# Patient Record
Sex: Female | Born: 1945
Health system: Southern US, Community
[De-identification: ages and names within clinical notes are randomized; demographics above are authoritative.]

## PROBLEM LIST (undated history)

## (undated) DIAGNOSIS — R569 Unspecified convulsions: Secondary | ICD-10-CM

## (undated) DIAGNOSIS — R51 Headache: Secondary | ICD-10-CM

## (undated) DIAGNOSIS — F329 Major depressive disorder, single episode, unspecified: Secondary | ICD-10-CM

## (undated) DIAGNOSIS — F419 Anxiety disorder, unspecified: Secondary | ICD-10-CM

## (undated) DIAGNOSIS — Z7409 Other reduced mobility: Secondary | ICD-10-CM

## (undated) DIAGNOSIS — F32A Depression, unspecified: Secondary | ICD-10-CM

## (undated) DIAGNOSIS — J45909 Unspecified asthma, uncomplicated: Secondary | ICD-10-CM

## (undated) DIAGNOSIS — N281 Cyst of kidney, acquired: Secondary | ICD-10-CM

## (undated) DIAGNOSIS — T819XXA Unspecified complication of procedure, initial encounter: Secondary | ICD-10-CM

## (undated) DIAGNOSIS — R519 Headache, unspecified: Secondary | ICD-10-CM

## (undated) DIAGNOSIS — Z8719 Personal history of other diseases of the digestive system: Secondary | ICD-10-CM

## (undated) DIAGNOSIS — M1990 Unspecified osteoarthritis, unspecified site: Secondary | ICD-10-CM

## (undated) DIAGNOSIS — K5900 Constipation, unspecified: Secondary | ICD-10-CM

## (undated) DIAGNOSIS — IMO0001 Reserved for inherently not codable concepts without codable children: Secondary | ICD-10-CM

## (undated) DIAGNOSIS — I10 Essential (primary) hypertension: Secondary | ICD-10-CM

## (undated) HISTORY — DX: Unspecified complication of procedure, initial encounter: T81.9XXA

## (undated) HISTORY — PX: BACK SURGERY: SHX140

## (undated) HISTORY — DX: Anxiety disorder, unspecified: F41.9

## (undated) HISTORY — DX: Depression, unspecified: F32.A

## (undated) HISTORY — DX: Constipation, unspecified: K59.00

## (undated) HISTORY — DX: Other reduced mobility: Z74.09

## (undated) HISTORY — PX: OTHER SURGICAL HISTORY: SHX169

## (undated) HISTORY — PX: TONSILLECTOMY: SUR1361

## (undated) HISTORY — DX: Major depressive disorder, single episode, unspecified: F32.9

## (undated) HISTORY — PX: ABDOMINAL HYSTERECTOMY: SHX81

---

## 2000-09-27 ENCOUNTER — Encounter: Admission: RE | Admit: 2000-09-27 | Discharge: 2000-09-27 | Payer: Self-pay | Admitting: Family Medicine

## 2000-09-27 ENCOUNTER — Encounter: Payer: Self-pay | Admitting: Family Medicine

## 2001-01-04 ENCOUNTER — Other Ambulatory Visit: Admission: RE | Admit: 2001-01-04 | Discharge: 2001-01-04 | Payer: Self-pay | Admitting: Family Medicine

## 2001-01-16 ENCOUNTER — Encounter: Admission: RE | Admit: 2001-01-16 | Discharge: 2001-01-16 | Payer: Self-pay | Admitting: *Deleted

## 2001-01-16 ENCOUNTER — Encounter: Payer: Self-pay | Admitting: *Deleted

## 2001-09-01 ENCOUNTER — Encounter: Payer: Self-pay | Admitting: *Deleted

## 2001-09-01 ENCOUNTER — Emergency Department (HOSPITAL_COMMUNITY): Admission: EM | Admit: 2001-09-01 | Discharge: 2001-09-01 | Payer: Self-pay | Admitting: Emergency Medicine

## 2001-11-08 ENCOUNTER — Emergency Department (HOSPITAL_COMMUNITY): Admission: EM | Admit: 2001-11-08 | Discharge: 2001-11-08 | Payer: Self-pay | Admitting: Emergency Medicine

## 2001-11-08 ENCOUNTER — Encounter: Payer: Self-pay | Admitting: Emergency Medicine

## 2002-01-01 ENCOUNTER — Emergency Department (HOSPITAL_COMMUNITY): Admission: EM | Admit: 2002-01-01 | Discharge: 2002-01-01 | Payer: Self-pay | Admitting: *Deleted

## 2002-01-01 ENCOUNTER — Encounter: Payer: Self-pay | Admitting: *Deleted

## 2002-02-26 ENCOUNTER — Emergency Department (HOSPITAL_COMMUNITY): Admission: EM | Admit: 2002-02-26 | Discharge: 2002-02-26 | Payer: Self-pay | Admitting: *Deleted

## 2002-02-26 ENCOUNTER — Encounter: Payer: Self-pay | Admitting: Emergency Medicine

## 2002-03-03 ENCOUNTER — Encounter: Payer: Self-pay | Admitting: Specialist

## 2002-03-03 ENCOUNTER — Encounter: Admission: RE | Admit: 2002-03-03 | Discharge: 2002-03-03 | Payer: Self-pay | Admitting: Specialist

## 2002-03-17 ENCOUNTER — Encounter: Payer: Self-pay | Admitting: Specialist

## 2002-03-17 ENCOUNTER — Encounter: Admission: RE | Admit: 2002-03-17 | Discharge: 2002-03-17 | Payer: Self-pay | Admitting: Specialist

## 2002-03-31 ENCOUNTER — Encounter: Payer: Self-pay | Admitting: Specialist

## 2002-03-31 ENCOUNTER — Encounter: Admission: RE | Admit: 2002-03-31 | Discharge: 2002-03-31 | Payer: Self-pay | Admitting: Specialist

## 2002-04-11 ENCOUNTER — Ambulatory Visit (HOSPITAL_COMMUNITY): Admission: RE | Admit: 2002-04-11 | Discharge: 2002-04-11 | Payer: Self-pay | Admitting: Specialist

## 2002-04-11 ENCOUNTER — Encounter (INDEPENDENT_AMBULATORY_CARE_PROVIDER_SITE_OTHER): Payer: Self-pay | Admitting: Specialist

## 2002-07-11 ENCOUNTER — Ambulatory Visit (HOSPITAL_COMMUNITY): Admission: RE | Admit: 2002-07-11 | Discharge: 2002-07-11 | Payer: Self-pay | Admitting: Urology

## 2002-07-11 ENCOUNTER — Encounter: Payer: Self-pay | Admitting: Urology

## 2002-07-17 ENCOUNTER — Ambulatory Visit (HOSPITAL_COMMUNITY): Admission: RE | Admit: 2002-07-17 | Discharge: 2002-07-17 | Payer: Self-pay | Admitting: Urology

## 2002-07-17 ENCOUNTER — Encounter: Payer: Self-pay | Admitting: Urology

## 2002-07-18 ENCOUNTER — Ambulatory Visit (HOSPITAL_COMMUNITY): Admission: RE | Admit: 2002-07-18 | Discharge: 2002-07-18 | Payer: Self-pay | Admitting: Internal Medicine

## 2002-07-18 ENCOUNTER — Encounter (INDEPENDENT_AMBULATORY_CARE_PROVIDER_SITE_OTHER): Payer: Self-pay | Admitting: Internal Medicine

## 2002-07-25 ENCOUNTER — Encounter: Payer: Self-pay | Admitting: Internal Medicine

## 2002-07-25 ENCOUNTER — Ambulatory Visit (HOSPITAL_COMMUNITY): Admission: RE | Admit: 2002-07-25 | Discharge: 2002-07-25 | Payer: Self-pay | Admitting: Internal Medicine

## 2002-08-18 ENCOUNTER — Ambulatory Visit (HOSPITAL_COMMUNITY): Admission: RE | Admit: 2002-08-18 | Discharge: 2002-08-18 | Payer: Self-pay | Admitting: Internal Medicine

## 2002-08-20 ENCOUNTER — Ambulatory Visit (HOSPITAL_COMMUNITY): Admission: RE | Admit: 2002-08-20 | Discharge: 2002-08-20 | Payer: Self-pay | Admitting: *Deleted

## 2002-08-20 ENCOUNTER — Encounter (INDEPENDENT_AMBULATORY_CARE_PROVIDER_SITE_OTHER): Payer: Self-pay | Admitting: Internal Medicine

## 2002-10-27 ENCOUNTER — Encounter: Payer: Self-pay | Admitting: Specialist

## 2002-10-27 ENCOUNTER — Encounter: Admission: RE | Admit: 2002-10-27 | Discharge: 2002-10-27 | Payer: Self-pay | Admitting: Specialist

## 2002-10-28 ENCOUNTER — Encounter: Payer: Self-pay | Admitting: Specialist

## 2003-01-27 ENCOUNTER — Encounter: Payer: Self-pay | Admitting: Specialist

## 2003-01-27 ENCOUNTER — Inpatient Hospital Stay (HOSPITAL_COMMUNITY): Admission: RE | Admit: 2003-01-27 | Discharge: 2003-01-30 | Payer: Self-pay | Admitting: Specialist

## 2003-05-05 ENCOUNTER — Inpatient Hospital Stay (HOSPITAL_COMMUNITY): Admission: AD | Admit: 2003-05-05 | Discharge: 2003-05-07 | Payer: Self-pay | Admitting: Internal Medicine

## 2003-05-10 ENCOUNTER — Inpatient Hospital Stay (HOSPITAL_COMMUNITY): Admission: AD | Admit: 2003-05-10 | Discharge: 2003-05-14 | Payer: Self-pay | Admitting: Family Medicine

## 2003-05-10 ENCOUNTER — Encounter: Payer: Self-pay | Admitting: Family Medicine

## 2003-05-12 ENCOUNTER — Encounter (INDEPENDENT_AMBULATORY_CARE_PROVIDER_SITE_OTHER): Payer: Self-pay | Admitting: Internal Medicine

## 2003-05-13 ENCOUNTER — Encounter (INDEPENDENT_AMBULATORY_CARE_PROVIDER_SITE_OTHER): Payer: Self-pay | Admitting: Internal Medicine

## 2003-07-10 ENCOUNTER — Ambulatory Visit (HOSPITAL_COMMUNITY): Admission: RE | Admit: 2003-07-10 | Discharge: 2003-07-10 | Payer: Self-pay | Admitting: Internal Medicine

## 2003-07-28 ENCOUNTER — Ambulatory Visit (HOSPITAL_COMMUNITY): Admission: RE | Admit: 2003-07-28 | Discharge: 2003-07-28 | Payer: Self-pay | Admitting: Internal Medicine

## 2003-10-11 ENCOUNTER — Emergency Department (HOSPITAL_COMMUNITY): Admission: EM | Admit: 2003-10-11 | Discharge: 2003-10-11 | Payer: Self-pay | Admitting: Emergency Medicine

## 2003-11-09 ENCOUNTER — Encounter: Admission: RE | Admit: 2003-11-09 | Discharge: 2003-11-09 | Payer: Self-pay | Admitting: Orthopedic Surgery

## 2004-02-16 ENCOUNTER — Ambulatory Visit (HOSPITAL_BASED_OUTPATIENT_CLINIC_OR_DEPARTMENT_OTHER): Admission: RE | Admit: 2004-02-16 | Discharge: 2004-02-16 | Payer: Self-pay | Admitting: Orthopedic Surgery

## 2004-05-31 ENCOUNTER — Encounter (HOSPITAL_COMMUNITY): Admission: RE | Admit: 2004-05-31 | Discharge: 2004-06-03 | Payer: Self-pay | Admitting: Orthopedic Surgery

## 2004-06-07 ENCOUNTER — Encounter (HOSPITAL_COMMUNITY): Admission: RE | Admit: 2004-06-07 | Discharge: 2004-07-07 | Payer: Self-pay | Admitting: Orthopedic Surgery

## 2004-06-13 ENCOUNTER — Emergency Department (HOSPITAL_COMMUNITY): Admission: EM | Admit: 2004-06-13 | Discharge: 2004-06-13 | Payer: Self-pay | Admitting: Emergency Medicine

## 2004-06-27 ENCOUNTER — Ambulatory Visit (HOSPITAL_COMMUNITY): Admission: RE | Admit: 2004-06-27 | Discharge: 2004-06-27 | Payer: Self-pay | Admitting: Neurology

## 2004-07-06 ENCOUNTER — Emergency Department (HOSPITAL_COMMUNITY): Admission: EM | Admit: 2004-07-06 | Discharge: 2004-07-06 | Payer: Self-pay | Admitting: Emergency Medicine

## 2005-07-26 ENCOUNTER — Ambulatory Visit: Payer: Self-pay | Admitting: Physical Medicine & Rehabilitation

## 2005-07-26 ENCOUNTER — Inpatient Hospital Stay (HOSPITAL_COMMUNITY): Admission: RE | Admit: 2005-07-26 | Discharge: 2005-07-31 | Payer: Self-pay | Admitting: Orthopedic Surgery

## 2009-03-22 ENCOUNTER — Ambulatory Visit (HOSPITAL_COMMUNITY): Admission: RE | Admit: 2009-03-22 | Discharge: 2009-03-22 | Payer: Self-pay | Admitting: Urology

## 2010-07-19 ENCOUNTER — Emergency Department (HOSPITAL_COMMUNITY): Admission: EM | Admit: 2010-07-19 | Discharge: 2010-07-19 | Payer: Self-pay | Admitting: Emergency Medicine

## 2010-11-28 ENCOUNTER — Other Ambulatory Visit (HOSPITAL_COMMUNITY): Payer: Self-pay | Admitting: Internal Medicine

## 2010-11-28 DIAGNOSIS — R1011 Right upper quadrant pain: Secondary | ICD-10-CM

## 2010-11-29 ENCOUNTER — Ambulatory Visit (HOSPITAL_COMMUNITY)
Admission: RE | Admit: 2010-11-29 | Discharge: 2010-11-29 | Disposition: A | Discharge status: Home / Self Care | Payer: PRIVATE HEALTH INSURANCE | Source: Ambulatory Visit | Attending: Internal Medicine | Admitting: Internal Medicine

## 2010-11-29 DIAGNOSIS — R1011 Right upper quadrant pain: Secondary | ICD-10-CM | POA: Insufficient documentation

## 2010-11-29 DIAGNOSIS — R112 Nausea with vomiting, unspecified: Secondary | ICD-10-CM | POA: Insufficient documentation

## 2010-11-29 DIAGNOSIS — K7689 Other specified diseases of liver: Secondary | ICD-10-CM | POA: Insufficient documentation

## 2011-01-20 NOTE — H&P (Signed)
NAME:  Renee Bridges, Renee Bridges                          ACCOUNT NO.:  [id number]   MEDICAL RECORD NO.:  [id number]                   PATIENT TYPE:  INP   LOCATION:  A307                                 FACILITY:  [facility]   PHYSICIAN:  Kingsley Callander. Ouida Sills, M.D.                  DATE OF BIRTH:  06/28/1946   DATE OF ADMISSION:  05/05/2003  DATE OF DISCHARGE:                                HISTORY & PHYSICAL   CHIEF COMPLAINT:  Abdominal pain and vomiting.   HISTORY OF PRESENT ILLNESS:  This patient is a 65 year old white female who  presented to the office with a 3-4 day history of lower abdominal pain  followed by nausea, vomiting, dysuria, fever and chills.  She was actually  afebrile when initially evaluated though.  She had 2+ white cells in the  urine and 2-3+ red cells with a cloudy appearing specimen.  Due to her  repeated vomiting episodes at the office, she was felt to require  hospitalization for IV antibiotics for her urinary tract infection.  She was  previously treated for urinary tract infection approximately a month ago  with a seven day course of Cipro XR.   PAST MEDICAL HISTORY:  1. Asthma.  2. Depression.  3. Tonsillectomy.  4. Hysterectomy.   MEDICATIONS:  1. Advair 250 b.i.d.  2. Singulair 10 mg daily.  3. Allegra 180 mg daily.  4. Albuterol nebulizers q.i.d. p.r.n.   ALLERGIES:  CODEINE.   SOCIAL HISTORY:  She does not smoke cigarettes or drink alcohol.   FAMILY HISTORY:  Her father had COPD.  Mother and sister had uterine cancer.   REVIEW OF SYSTEMS:  She denies gross hematuria, difficulty breathing, chest  pain or bowel complaints.   PHYSICAL EXAMINATION:  VITAL SIGNS:  Temperature 97.1, blood pressure  129/89, pulse 84, respirations 16, oxygen saturation 97% on room air.  Weight 146.  GENERAL APPEARANCE:  Ill-appearing, uncomfortable appearing white female.  HEENT:  No scleral icterus.  Pharynx is unremarkable.  NECK:  Supple with no JVD or thyromegaly.  LUNGS:   Clear.  No wheezes.  HEART:  Regular with no murmurs.  ABDOMEN:  Tender in the suprapubic region and tender in the costovertebral  angle areas bilaterally.  No hepatosplenomegaly or palpable abdominal mass.  EXTREMITIES:  No cyanosis, clubbing or edema.  NEUROLOGICAL:  Grossly intact.   LABORATORY DATA:  Urinalysis reveals 2+ white cells and 2+ red cells.  White  count 7.1, hemoglobin 15.3, platelets 230.  Sodium 140, potassium 4,  bicarbonate 30, glucose 110, BUN 11, creatinine 0.6.  SGPT 22.   IMPRESSION:  1. Pyelonephritis.  She is being hospitalized for treatment with IV     antibiotics.  Blood and urine cultures are being obtained.  She will be     treated with IV Phenergan and morphine p.r.n.  2. Asthma.  Stable on Advair, Singulair and Albuterol.  Kingsley Callander. Ouida Sills, M.D.    ROF/MEDQ  D:  05/06/2003  T:  05/06/2003  Job:  [id number]

## 2011-01-20 NOTE — Discharge Summary (Signed)
Renee Bridges, Renee Bridges                ACCOUNT NO.:  [id number]   MEDICAL RECORD NO.:  [id number]          PATIENT TYPE:  INP   LOCATION:  1510                         FACILITY:  [facility]   PHYSICIAN:  Marlowe Kays, M.D.  DATE OF BIRTH:  April 15, 1946   DATE OF ADMISSION:  07/26/2005  DATE OF DISCHARGE:  07/31/2005                                 DISCHARGE SUMMARY   ADMISSION DIAGNOSES:  1.  Severe end-stage osteoarthritis, left knee.  2.  History of asthma.   DISCHARGE DIAGNOSES:  1.  Severe end-stage osteoarthritis, left knee.  2.  History of asthma.  3.  Mild postoperative anemia.   OPERATION:  On July 26, 2005, the patient underwent [employer] total knee  replacement arthroplasty with all three components cemented.  D.L.  Idolina Primer, PA-C, assisted.   BRIEF HISTORY:  This 65 year old lady had progressive problems concerning  pain into her left knee.  Conservative measures, including Hyalgan  injections, anti-inflammatories, rest, ice, and exercises have not improved  it.  X-ray shows severe degenerative changes of the knee.  After much  discussion, including risks and benefits of surgery, it was decided to go  ahead with the above procedure.   HOSPITAL COURSE:  The patient tolerated the surgical procedure quite well.  Was placed on Coumadin protocol postoperative for the prevention of DVT.  Hemovac was removed on the first postoperative day and dressing changes were  done on the second postoperative day.  The wound remained clean and dry  throughout her hospitalization.  Neurovascularly intact in the operative  extremity.  Felt the patient would benefit with an inpatient rehabilitation;  however, she improved so well that she was really not a candidate.  Therefore, made arrangements for home health.  The patient was allowed  weightbearing as tolerated, and she did quite well.   On the day of discharge, she was ambulating in the hall.  ADLs had been  completed.  The wound was  dry.  Neurovascular was intact to the operative  extremity.  The pain was being controlled with p.o. analgesics.   Laboratory values in the hospital hematologically show a CBC upon admission  completely within normal limits.  Hemoglobin is 15.3, hematocrit 46.3.  Final hemoglobin is 9.7, hematocrit 28.9 (the patient was placed on iron).  Her blood chemistries were essentially negative.  Urinalysis was also  negative for urinary tract infection.   Electrocardiogram showed normal sinus rhythm.   Postoperative films showed good AP and lateral alignment of left total knee  prosthesis.  The chest x-ray showed no active cardiopulmonary disease.   CONDITION ON DISCHARGE:  Improved and stable.   PLAN:  The patient is discharged to her home with home health, physical  therapist, nurse, and occupational therapist.  Continue with total knee  protocol at home.  She is to continue with her home medications and diet.  We  have given her a prescription for Demerol tabs for pain, Robaxin 500 mg as a  muscle relaxant, Trinsicon b.i.d. x1 month to help build up her blood,  Coumadin per pharmacy for four weeks after the date of  surgery.  Return to  see Dr. Simonne Come after the date of surgery.      Dooley L. Cherlynn June.    ______________________________  Marlowe Kays, M.D.    DLU/MEDQ  D:  08/07/2005  T:  08/07/2005  Job:  [id number]

## 2011-01-20 NOTE — Group Therapy Note (Signed)
NAME:  Renee Bridges, Renee Bridges                          ACCOUNT NO.:  [id number]   MEDICAL RECORD NO.:  [id number]                  PATIENT TYPE:   LOCATION:                                       FACILITY:   PHYSICIAN:  Lionel December, M.D.                 DATE OF BIRTH:   DATE OF PROCEDURE:  DATE OF DISCHARGE:                                   PROGRESS NOTE   PRESENTING COMPLAINT:  Right-sided abdominal pain, nausea, and vomiting.   HISTORY OF PRESENT ILLNESS:  The patient is a 65 year old Caucasian female  who was last seen on July 15, 2002, at Dr. Darius Bump request for above-  mentioned symptoms.  She had prior unenhanced CT which showed hyperdense  lesion in upper pole of right kidney measuring 3.7 cm.  It also revealed  sigmoid colon diverticulosis.  This study was without IV contrast,  therefore, felt not to be very sensitive.  She was, therefore, subjected to  a repeat abdominopelvic CT on July 18, 2002, with oral and IV contrast.  Once again it showed cyst within the upper pole of the right kidney felt to  be simple cyst.  There was no evidence of retroperitoneal adenopathy or  ascites.  She had no evidence of pancreatic mass or renal stones.  Some of  the loops of small bowel were not well filled.  We also checked a CBC, which  was normal.  Sedimentation rate was 10.  LFTs were normal.  Amylase was  borderline at 133, and lipase was normal at 47.  Called in a prescription  for Phenergan last week, but she has not filled it yet.  She does not feel  any better.  However, her major complaint today is one of excruciating pain  with urination.  She also complains of some discoloration of urine and feels  that she is having some hematuria.  She was seen by Dr. Jerre Simon last week and  apparently treated with antibiotic, but she is not feeling any better.  She  states the pain is so excruciating that it makes her nauseated and brings  tears to her eyes.  She is more preoccupied with  these symptoms than her  right-sided pain which is still there.  She states it has not changed any.  She has frequent nausea, sporadic vomiting.  The last time she vomited was  the day after Thanksgiving.  She also has a history of intermittent solid-  food dysphagia but denies frequent heartburn.  She also denies melena,  rectal bleeding, fever, or chills.  She has a history of constipation and  tries to eat a lot of fiber in her foods.  She is on Phenergan 25 mg b.i.d.  p.r.n. and albuterol inhaler/nebulizer q.i.d. p.r.n.  It is not clear to me  whether she is still on Cipro.   PAST MEDICAL HISTORY:  1. Bronchial asthma.  2. She has  had hysterectomy.  3. Benign lesion removed from her back twice.  4. She has had left bunionectomy.  5. Surgery on right hand for tendinitis.   ALLERGIES:  CODEINE, causing rash.   FAMILY HISTORY:  Mother died of uterine cancer, father of COPD and had  cancer of the spine.  Family history is also significant for uterine cancer  in a sister.   SOCIAL HISTORY:  She is divorced.  She has two children.  She is disabled.  She never smoked cigarettes and does not drink alcohol.   PHYSICAL EXAMINATION:  VITAL SIGNS:  She weighs 171 pounds, she is 5 feet 3  inches tall.  Pulse 88 per minute, blood pressure 112/90.  She is afebrile.  HEENT:  Conjunctivae is pink, sclerae is nonicteric.  Oropharyngeal mucosa  is normal.  No adenopathy or thyromegaly.  CARDIAC:  Regular rhythm.  Normal S1, S3.  No murmur or gallop noted.  LUNGS:  Clear to auscultation.  ABDOMEN:  Symmetrical.  Bowel sounds are normal.  No bruits noted.  Palpation reveals soft abdomen with tenderness which is mainly in  hypogastric area and right lower quadrant.  There is no guarding.  EXTREMITIES:  She does not have peripheral edema.   ASSESSMENT:  The patient has a several-week history of intermittent right  lower quadrant abdominal pain associated with nausea and vomiting.  Her CBC,   sedimentation rate, LFTs, and lipase are normal, and amylase was borderline.  She also has a history of intermittent solid-food dysphagia and presently  has what can be considered strangury.  She could still have cystitis.  It  appears she has not gotten better with antibiotics and needs to follow with  Dr. Jerre Simon.  She also needs to have EGD and a colonoscopy.  Will try to  arrange these in the near future.   RECOMMENDATIONS:  1. I have called Dr. Rudean Haskell office and made an appointment for her to be     seen at 3 p.m. on August 07, 2002.  2. Will proceed with EGD with possible ED and total colonoscopy in the near     future.  3. Will try to get some samples of NuLev sublingually t.i.d. p.r.n.  4. Will also ask that she continue a high-fiber diet and take Colace (OTC) 2     tablets at bedtime.                                               Lionel December, M.D.    NR/MEDQ  D:  08/06/2002  T:  08/06/2002  Job:  [id number]   cc:   Dr. Briant Cedar, M.D.  [address]  [place]  [place] [address]  Fax: [phone]   [facility] [place]

## 2011-01-20 NOTE — H&P (Signed)
NAME:  Renee Bridges, Renee Bridges                          ACCOUNT NO.:  [id number]   MEDICAL RECORD NO.:  [id number]                   PATIENT TYPE:  INP   LOCATION:  NA                                   FACILITY:  [facility]   PHYSICIAN:  Ronnell Guadalajara, M.D.                DATE OF BIRTH:  01-Jan-1946   DATE OF ADMISSION:  01/27/2003  DATE OF DISCHARGE:                                HISTORY & PHYSICAL   CHIEF COMPLAINT:  Pain in my back and right leg.   HISTORY OF PRESENT ILLNESS:  This 66 year old white female is seen by Dr.  Montez Morita for continuing and progressive problems concerning pain in her low  back with radiation into the lower extremities.  She has had surgery in the  distant past in the lumbar area and subsequently had epidural steroid  injections to her back.  Unfortunately she continues with pain and  discomfort into the lower extremities.  Now her pain is also into the left  lower extremity as her activity indicates.  The patient has had a myelogram  which has shown a moderate spinal bilateral recess stenosis at L4-5 which is  multifactorial.  The patient is being scheduled for decompressive lumbar  laminectomy.   PAST SURGICAL HISTORY:  1. Lipoma removed from lower back.  2. Bunionectomies bilaterally in the past.  3. She has some sort of hand tendon in the right hand as well.  4. Hysterectomy.   PAST MEDICAL HISTORY:  The only real medical problem she has is bronchitis  with asthmatic bronchitis.  She uses a nebulizer.  She also has generalized  arthritis.   MEDICATIONS:  Other medications include Singulair and Effexor XR 75 mg  p.r.n.   ALLERGIES:  CODEINE causes a rash.   SOCIAL HISTORY:  The patient neither smokes nor drinks.   FAMILY HISTORY:  Noncontributory.   REVIEW OF SYSTEMS:  CNS: No seizures, paralysis, double vision, but the  patient does have pain consistent with radiculopathy in the right lower  extremity, sometimes on the left.  CARDIOVASCULAR:  No chest  pain, no  angina, no orthopnea.  RESPIRATORY:  The patient does well with her current  nebulizers.  Occasionally has wheezing and must present to the emergency  room for treatments, none lately.  GASTROINTESTINAL:  No nausea, vomiting,  melena, or bloody stool.  GENITOURINARY:  No discharge, dysuria, or  hematuria.  MUSCULOSKELETAL:  Primarily as in History of Present Illness.   PHYSICAL EXAMINATION:  GENERAL:  Alert, cooperative, friendly, somewhat  apprehensive 65 year old white female.  VITAL SIGNS:  Blood pressure 120/76, pulse 84, respirations 18.  HEENT:  Normocephalic.  PERRLA.  EOMs intact.  Oropharynx clear with missing  frontal dentures.  CHEST:  Clear to auscultation.  No rhonchi, no rales, no wheezes.  HEART:  Regular rate and rhythm.  No murmurs are heard.  ABDOMEN:  Soft, nontender.  Liver and spleen  not felt.  GENITALIA/RECTAL:  Not done, not pertinent to present illness.  EXTREMITIES:  The patient has no gross deformities.  Straight leg raise is  negative.   ADMISSION DIAGNOSES:  1. Spinal stenosis at L4-L5.  2. Asthma.   PLAN:  The patient will undergo central decompressive lumbar laminectomy.  She mentioned that she would like to have a hospital bed at home. I told her  that most patients do not usually require this; however, should she have  difficulty getting about, we will certainly see what we can do.     Dooley L. Cherlynn June.                 Ronnell Guadalajara, M.D.    DLU/MEDQ  D:  01/20/2003  T:  01/20/2003  Job:  [id number]

## 2011-01-20 NOTE — Op Note (Signed)
NAME:  Digiacomo, Catha S                          ACCOUNT NO.:  [id number]   MEDICAL RECORD NO.:  [id number]                   PATIENT TYPE:  AMB   LOCATION:  DAY                                  FACILITY:  [facility]   PHYSICIAN:  Lionel December, M.D.                 DATE OF BIRTH:  12-14-1945   DATE OF PROCEDURE:  08/18/2002  DATE OF DISCHARGE:                                 OPERATIVE REPORT   PROCEDURE:  Esophagogastroduodenoscopy with esophageal dilatation, followed  by total colonoscopy.   INDICATIONS:  The patient is a 65 year old Caucasian female with recurrent  right lower quadrant abdominal pain, constipation, intermittent nausea and  vomiting.  She also has dysphagia primarily to solids.  She has had CBC,  LFT, amylase, and lipase, calcium, have all been normal.  Abdominal-pelvic  CT was also unremarkable.  She has not responded to acid suppression and  antispasmodic therapy.  She is undergoing diagnostic and therapeutic  procedures.  The procedures were reviewed with the patient.  Informed  consent was obtained.   PREMEDICATION:  Cetacaine spray for pharyngeal topical anesthesia.  Demerol  50 mg IV, Versed 8 mg IV in divided dose.   INSTRUMENT USED:  Olympus video system.   FINDINGS:  Procedure performed in endoscopy suite.  The patient's vital  signs and O2 saturation were monitored during the procedure and remained  stable.   PROCEDURE:  Esophagogastroduodenoscopy.   DESCRIPTION OF PROCEDURE:  The patient was placed in the left lateral  recumbent position and the endoscope was passed via the oropharynx without  any difficulty into esophagus.   Esophagus:  Mucosa of the esophagus normal throughout.  The squamocolumnar  junction was unremarkable.  There was a small sliding hiatal hernia without  ring or stricture formation.   Stomach:  It was empty and distended very well with insufflation.  The folds  of the proximal stomach were normal.  Examination of the mucosa at  gastric  body, antrum, pyloric channel, as well as angularis and fundus, was normal.   Duodenum:  Examination of the bulb and the second part of the duodenum was  also normal.   The endoscope was withdrawn.  Esophageal dilatation was performed by passing  a 56 French Maloney dilator through esophagus.  Some resistance noted early  on.  The dilator was passed to full insertion.  After the dilatation was  complete, the endoscope was passed again and there was a small liner tear in  cervical esophagus suggesting either a web or luminal narrowing.  Endoscope  was withdrawn.  The patient was prepared for procedure #2.   PROCEDURE:  Total colonoscopy.   DESCRIPTION OF PROCEDURE:  Rectal examination was performed.  No abnormality  noted on external or digital exam.  Scope was placed in the rectum and  advanced under vision to the sigmoid colon and beyond.  Preparation was  satisfactory up to splenic  flexure.  She had some formed stool at transverse  and ascending colon.  I was able to pass the scope to the cecum, which was  identified by ileocecal valve.  The blunt end of the cecum was normal.  As  the scope was withdrawn, colonic mucosa was once again carefully examined.  There was punctate pigmentation to mucosa of descending and sigmoid colon  consistent with melanosis coli.  Small polyp at sigmoid colon, which was  ablated by a cold biopsy.  The rectal mucosa was normal.  The scope was  retroflexed to examine the anorectal junction, and hemorrhoids were noted  below the dentate line.  The endoscope was withdrawn.  The patient tolerated  the procedure well.   FINAL DIAGNOSES:  1. Small sliding hiatal hernia.  2. Esophagus dilated, given history of dysphagia.  Esophageal dilatation     with 56 French Maloney dilator resulted in a small linear tear of     cervical esophagus suggesting either luminal narrowing or esophageal web.  3. Colonoscopy performed to cecum with preparation of the  proximal colon     suboptimal.  4. Melanosis coli.  5. External hemorrhoids.  6. Small polyp at sigmoid colon, which was ablated by a cold biopsy.   RECOMMENDATIONS:  1. Will stop her [place]stein and try her on Bentyl 30 mg p.o. b.i.d.  2. Will proceed with a small bowel follow-through.  3. She will continue Metamucil one tablespoonful daily and Colace two     tablets at bedtime.  4. I will be contacting the patient with the results of study when     completed.                                               Lionel December, M.D.    NR/MEDQ  D:  08/18/2002  T:  08/19/2002  Job:  [id number]

## 2011-01-20 NOTE — Consult Note (Signed)
NAME:  Renee Bridges, Renee Bridges                          ACCOUNT NO.:  [id number]   MEDICAL RECORD NO.:  [id number]                   PATIENT TYPE:  INP   LOCATION:  A322                                 FACILITY:  [facility]   PHYSICIAN:  Dennie Maizes, M.D.                DATE OF BIRTH:  1945-12-07   DATE OF CONSULTATION:  DATE OF DISCHARGE:                                   CONSULTATION   ATTENDING PHYSICIAN:  Kingsley Callander. Ouida Sills, M.D.   CONSULTING PHYSICIAN:  Dennie Maizes, M.D.   REASON FOR CONSULTATION:  Dysuria.   CONSULTATION REPORT:  This 65 year old female is known to me from office  evaluation.  She has a history of recurrent urinary tract infections.  She  also has trouble with some urinary frequency and urgency.  She was treated  with ____________ 5 mg p.o. every day and she was on Macrodantin  prophylaxis.  She was admitted to the hospital last week with a urinary  tract infection and started on IV Rocephin.  After discharge from the  hospital, she experienced severe lower abdominal pain and dysuria.  She has  been admitted to the hospital for further evaluation.  She also has nausea,  vomiting, dysphagia as well as diarrhea at present.  She has undergone  evaluation by Dr. Karilyn Cota and Dr. Jena Gauss.  The patient complains of  intermittent dysuria.  She has a burning sensation in the vagina after  voiding.  She also has experienced itching in the vagina.  There is no  history of voiding difficulty or gross hematuria at present.  She has been  evaluated with a renal ultrasound, also has had a CT of the abdomen and  pelvis.  This revealed a right renal cyst.  There is no evidence of any  urinary calculi or obstruction.   PAST MEDICAL HISTORY:  1. Status post hysterectomy.  2. Status post right hand surgery and foot surgery.  3. She has undergone back surgery x 2.   ALLERGIES:  CODEINE.   PHYSICAL EXAMINATION:  GENERAL:  The patient is comfortable at the time of  examination.  ABDOMEN:   Soft.  No palpable flank mass.  Moderate suprapubic _____________  left lower quadrant abdominal tenderness is noted.  No costovertebral angle  tenderness.  PELVIC:  Reveals atrophy and vaginitis.   ADMISSION LABS:  WBCs 7.2, hemoglobin 14.6, hematocrit 44.2, BUN 11,  creatinine 1.2.  Urine culture and sensitivity is negative.   IMPRESSION:  1. Dysuria due to atrophic vaginitis.  2. History of recurrent urinary tract infections.   PLAN:  1. Estrace vaginal cream external application q.h.s.  2. Return to the office in six weeks for followup.   Thanks for this consult.  Dennie Maizes, M.D.    SK/MEDQ  D:  05/13/2003  T:  05/13/2003  Job:  [id number]   cc:   Kingsley Callander. Ouida Sills, M.D.  [address]  [place]  [place] [address]  Fax: [phone]

## 2011-01-20 NOTE — Discharge Summary (Signed)
   NAME:  Renee Bridges, Renee Bridges                          ACCOUNT NO.:  [id number]   MEDICAL RECORD NO.:  [id number]                   PATIENT TYPE:  INP   LOCATION:  A307                                 FACILITY:  [facility]   PHYSICIAN:  Kingsley Callander. Ouida Sills, M.D.                  DATE OF BIRTH:  [birth year]   DATE OF ADMISSION:  05/05/2003  DATE OF DISCHARGE:  05/07/2003                                 DISCHARGE SUMMARY   DISCHARGE DIAGNOSES:  1. Pyelonephritis.  2. Asthma.  3. Nausea and vomiting.   HOSPITAL COURSE:  This patient is a 65 year old white female who presented  with dysuria, nausea, vomiting, flank pain, fever and chills.  She had 2+  white cells in the urine and 2 to 3+ red cells.  Her urine culture has been  reincubated for better growth.  Her blood cultures have been negative.  She  was treated with IV Rocephin.  Her white count was 7.1, BUN and creatinine  were 11 and 0.6.   She responded well to Rocephin.  She had no additional fever.  Her voiding  symptoms improved.  Her vomiting resolved.  She was treated with IV fluids.  Her diet was advanced.  She tolerated this reasonably well and had no nausea  and vomiting for 24 hours prior to discharge.  She will be continued on oral  outpatient antibiotic therapy and will have followup in the office in 2  weeks.   DISCHARGE MEDICATIONS:  1. Levaquin 250 mg once daily.  2. Advair 250 mcg b.i.d.  3. Singulair 10 mg once daily.  4. Allegra 180 mg once daily.  5. Albuterol inhaler two puffs q.4 p.r.n.                                               Kingsley Callander. Ouida Sills, M.D.    ROF/MEDQ  D:  05/07/2003  T:  05/07/2003  Job:  [id number]

## 2011-01-20 NOTE — Discharge Summary (Signed)
   NAME:  Renee Bridges, Renee Bridges                          ACCOUNT NO.:  [id number]   MEDICAL RECORD NO.:  [id number]                   PATIENT TYPE:  INP   LOCATION:  A322                                 FACILITY:  [facility]   PHYSICIAN:  Kingsley Callander. Ouida Sills, M.D.                  DATE OF BIRTH:  1946-05-03   DATE OF ADMISSION:  05/10/2003  DATE OF DISCHARGE:  05/14/2003                                 DISCHARGE SUMMARY   DISCHARGE DIAGNOSES:  1. Abdominal pain.  2. Atrophic vaginitis.  3. Asthma.  4. History of depression.   PROCEDURES:  1. Abdominal CT scan.  2. Ultrasound of the abdomen.  3. HIDA scan.   HOSPITAL COURSE:  This patient is a 65 year old white female who was  admitted by Dr. Sudie Bailey after being recently discharged from [facility].  She had previously been treated for a presumed pyelonephritis, but her urine  culture revealed multiple species only.  She had no fever.  Her white count  was 7.2.  LFTs were normal.  Her sed rate was 24.  A CT scan of the abdomen  and pelvis was negative.  She subsequently underwent evaluation for her  gallbladder with an ultrasound which revealed no stones.  She had a HIDA  scan which revealed a normal ejection fraction.  She was seen in GI  consultation by the gastroenterologist.  She had previously had an EGD and a  colonoscopy.  She was treated empirically with Bentyl and Protonix.  She  experienced vomiting intermittently.  The etiology of her pain and vomiting  remained somewhat unclear.   She complained of persistent dysuria.  She was seen in consultation by Dr.  Rito Ehrlich who recommended Estrace cream for atrophic vaginitis.   She was stable for discharge on 05/14/2003.  She will be seen in followup in  the office in two weeks.  We discussed referral to [facility] for an additional  opinion if she would desire.  It is felt that some of her symptoms may be  functional in nature.  She was restarted on antidepressant therapy with  Lexapro.   DISCHARGE MEDICATIONS:  1. Lexapro 5 mg daily for six days and then 10 mg daily.  2. Bentyl 10 mg t.i.d.  3. Estrace cream nightly.  4. Allegra 180 mg daily.  5.     Singulair 10 mg daily.  6. Advair 250 one puff b.i.d.   FOLLOW UP:  The patient will be seen in my office in two weeks.                                               Kingsley Callander. Ouida Sills, M.D.    ROF/MEDQ  D:  05/14/2003  T:  05/14/2003  Job:  [id number]

## 2011-01-20 NOTE — H&P (Signed)
NAME:  Renee Bridges, Renee Bridges                          ACCOUNT NO.:  [id number]   MEDICAL RECORD NO.:  [id number]                   PATIENT TYPE:  INP   LOCATION:  A322                                 FACILITY:  [facility]   PHYSICIAN:  Mila Homer. Sudie Bailey, M.D.           DATE OF BIRTH:  07/21/1946   DATE OF ADMISSION:  05/10/2003  DATE OF DISCHARGE:                                HISTORY & PHYSICAL   HISTORY OF PRESENT ILLNESS:  This is a 65 year old woman who has been having  problems with vomiting after eating going on for about a year.  This happens  about 75% of the time.  Some of the food gets stuck on the way down, some of  it she eats, then vomits everything up.  She was recently in the hospital  under Dr. Ouida Sills for what was felt to be an UTI.  At that time, she was  having lower abdominal pain, was admitted to the hospital about five or six  days ago, treated with IV Rocephin, felt much better until three days, then  discharged from the hospital on Thursday, May 07, 2003, three days ago.  She was discharged on Levaquin 250 mg q.d., but felt somewhat worse the day  after discharge.  Yesterday, began to get abdominal pain again, and today  had abdominal pain and vomiting.  She also developed diarrhea.   She has been generally healthy.  She had what sounds like an EGD at age 26,  almost 40 years ago.  She has two children.  At age 89, she had a  hysterectomy due to severe vaginal bleeding.  She does not know whether the  ovaries were left in place.  She has not had an appendectomy or a  cholecystectomy.  She is on some pain medicine and muscle spasm medicines by  Dr. Montez Morita, orthopaedist in [place].  Back on Jan 27, 2003, he operated  on her in [place] for apparently three disks in the low back.   She is officially disabled and on disability.  Medicare, Medicaid due to  severe depression for which she is treated by a psychiatrist in [place] at  [facility].   She is on medication daily for this, but  cannot remember the name.   ALLERGIES:  CODEINE.   SOCIAL HISTORY:  Currently, she lives locally.   PHYSICAL EXAMINATION:  GENERAL:  She was initially seen in the office.  She  was oriented and alert, in no acute distress except for the left lower  quadrant abdominal pain.  She is well-developed, well-nourished.  HEART:  Regular rhythm, rate of about 70.  LUNGS:  Clear throughout.  VITAL SIGNS:  The BP was stable at 112/80, the pulse was 80.  ABDOMEN:  Soft without hepatosplenomegaly or mass, but she had fairly severe  left lower quadrant tenderness on palpation.  There is no edema in the  ankles.  ASSESSMENT:  1. Left lower quadrant abdominal pain, recurrent, which is not clearing with     Levaquin.  Diagnoses could include appendicitis, diverticulitis, ovarian     cyst, etc.  2. Question esophageal stricture.  3. Question gastric outlet obstruction.  4. Status post hysterectomy.  5. Severe major depression which has resulted in disability.  6. Degenerative disk disease, status post surgery, Dr. Montez Morita, as mentioned     above.   PLAN:  Admit to hospital.  We will put her back on IV fluids and Rocephin 1  g IV q.24h.  Consultation with Dr. Karilyn Cota and Rourk, and plan for EGD and  colonoscopy.  Repeat blood work with abdominal CT scan tonight.  Discussed  at length with the patient and her significant other.                                               Mila Homer. Sudie Bailey, M.D.    SDK/MEDQ  D:  05/10/2003  T:  05/10/2003  Job:  [id number]

## 2011-01-20 NOTE — Op Note (Signed)
NAME:  Renee Bridges, Renee Bridges                          ACCOUNT NO.:  [id number]   MEDICAL RECORD NO.:  [id number]                   PATIENT TYPE:  AMB   LOCATION:  NESC                                 FACILITY:  [facility]   PHYSICIAN:  Marlowe Kays, M.D.               DATE OF BIRTH:  Jan 05, 1946   DATE OF PROCEDURE:  02/16/2004  DATE OF DISCHARGE:                                 OPERATIVE REPORT   PREOPERATIVE DIAGNOSES:  Medial compartment arthritis with possible  degenerative posterior horn tear of the medial meniscus.   POSTOPERATIVE DIAGNOSES:  Medial compartment arthritis and degenerative  posterior horn tear of the medial meniscus.   PROCEDURE:  Left knee arthroscopy with partial medial meniscectomy and  debridement of medial femoral condyle.   SURGEON:  Marlowe Kays, M.D.   ASSISTANT:  Nurse.   ANESTHESIA:  General.   PATHOLOGY AND JUSTIFICATION FOR PROCEDURE:  Progressive pain in the inner  aspect of her left knee to the point where she is having difficulty  weightbearing. An MRI of November 09, 2003 has demonstrated moderate joint  effusion, some medial compartment arthritis, a Baker'Bridges cyst and possibly a  posterior horn degenerative tear of the medial meniscus. Because she has  failed all other nonsurgical treatments, she is here today for the  arthroscopy. She indeed did have a posterior horn degenerative type tear of  the medial meniscus with grade 2-3/4 chondromalacia of the medial femoral  condyle and medial tibial plateau. The remainder of her knee looked  relatively unremarkable.   DESCRIPTION OF PROCEDURE:  Satisfactory general anesthesia, knee was  esmarched out nonsterilely, pneumatic tourniquet, thigh stabilized, left  knee was prepped with Duraprep, draped in a sterile field, superomedial  saline inflow.  First through an anterolateral portal,  the medial  compartment of the knee joint was evaluated.  She had some synovitis in the  joint which I resected.  There  was a good bit of partial detachment of the  medial femoral condyle articular cartilage which was quite thin in addition.  All of this was shaved down until smooth with a 3.5 shaver. She had some  __________ from front to back in the medial tibial plateau which did not  require surgical treatment. The posterior horn tear of the medial meniscus  was identified and treated as noted above resecting back to stable rim with  baskets and shaving down until smooth with a 3.5 shaver. I then looked up  the medial gutter and suprapatellar area with no abnormalities noted. I then  reversed portals, the lateral compartment of the knee joint was unremarkable  and did not require arthroscopic treatment. The knee joint was then  irrigated until clear and all fluid possible removed. The two anterior  portals were closed with 4-0 nylon and 20 mL of 0.5% Marcaine with  adrenaline  was instilled through the inflap rasp which was removed and this portal  closed  with 4-0 nylon as well. Betadine Adaptic dry sterile dressing were  applied, tourniquet was released. She tolerated the procedure well and was  taken to the recovery room in satisfactory condition with no known  complications.                                               Marlowe Kays, M.D.    JA/MEDQ  D:  02/16/2004  T:  02/16/2004  Job:  [id number]

## 2011-01-20 NOTE — H&P (Signed)
NAME:  Renee Bridges, Renee Bridges                ACCOUNT NO.:  [id number]   MEDICAL RECORD NO.:  [id number]          PATIENT TYPE:  INP   LOCATION:  NA                           [facility]   PHYSICIAN:  Marlowe Kays, M.D.  DATE OF BIRTH:  02-Jan-1946   DATE OF ADMISSION:  07/26/2005  DATE OF DISCHARGE:                                HISTORY & PHYSICAL   CHIEF COMPLAINT:  Pain in my left knee.Marland Kitchen   PRESENT ILLNESS:  This is a 65 year old white female who has had progressive  problems concerning pain into her left knee.  We have done as much  conservative treatment as we possibly can do, including anti-inflammatories,  Hyalgan injections, splints, and supports.  This is a very active lady who  enjoys dancing and she finds that she really cannot do any of her enjoyable  activity secondary to her left knee pain.  It is swollen and has difficulty  in range of motion.  After much discussion, including the risks and benefits  of surgery and a book on total knee replacement surgery, the patient is  scheduled for total knee replacement arthroplasty of the left knee.   PAST MEDICAL HISTORY:  The patient has been in good health throughout her  lifetime.  1.  We have done a lumbar laminectomy on her, and she has done very well      since then.  2.  She also had hand surgery and foot surgery.  3.  She has a history of asthma.  The last attack was in 1999.   MEDICATIONS:  She takes no medications at the present time.   ALLERGIES:  We will need to check with her again on her allergy situation  and review the records from her preadmission.   FAMILY HISTORY:  Positive for heart disease.   SOCIAL HISTORY:  The patient is single, retired.  Has no intake of alcohol  or tobacco products.  Has 2 female children.  She will probable need  rehabilitation after the surgery, as she has no caregiver at home.   REVIEW OF SYSTEMS:  CNS:  No seizures, shoulder paralysis, numbness, or  double vision.  RESPIRATORY:   No productive cough, no hemoptysis, no  shortness of breath.  CARDIOVASCULAR:  No chest pain, no angina, no  orthopnea.  GASTROINTESTINAL:  No nausea, vomiting, melena, or blood stools.  GENITOURINARY:  No discharge or hematuria.  MUSCULOSKELETAL:  Primarily in  the present illness.   PHYSICAL EXAMINATION:  GENERAL:  Alert, cooperative, friendly, animated,  somewhat anxious 65 year old white female.  VITAL SIGNS:  Blood pressure 100/60, pulse 76, respirations 24.  HEENT:  Normocephalic.  Pupils equal, round and reactive to light and  accommodation.  Wears glasses.  Oropharynx is clear.  CHEST:  Clear to auscultation.  No rhonchi, no rales.  HEART:  Regular rate and rhythm.  No murmurs were heard.  ABDOMEN:  Soft, nontender.  Liver and spleen not felt.  GENITALIA/RECTAL/PELVIC/BREAST:  Not done.  Not pertinent to present  illness.  EXTREMITIES:  The patient has a mild amount of swelling to the left  knee.  Crepitus on range of motion and pain with range of motion.   ADMITTING DIAGNOSES:  1.  Severe end-stage osteoarthritis of the left knee.  2.  History of asthma.   PLAN:  The patient will undergo left total knee replacement arthroplasty.  We will ask for a rehabilitation consult postoperatively, as she says she  has no caregiver at home.      Dooley L. Cherlynn June.    ______________________________  Marlowe Kays, M.D.    DLU/MEDQ  D:  07/20/2005  T:  07/20/2005  Job:  [id number]

## 2011-01-20 NOTE — Op Note (Signed)
NAME:  Renee Bridges, Renee Bridges                          ACCOUNT NO.:  [id number]   MEDICAL RECORD NO.:  [id number]                   PATIENT TYPE:  AMB   LOCATION:  DAY                                  FACILITY:  [facility]   PHYSICIAN:  Lionel December, M.D.                 DATE OF BIRTH:  [birth year]   DATE OF PROCEDURE:  DATE OF DISCHARGE:  07/28/2003                                 OPERATIVE REPORT   PROCEDURE:  Esophageal manometry.   REQUESTING PHYSICIAN:  Lionel December, M.D.   INDICATIONS:  Dysphagia.   PRIOR STUDIES:  EGD in December 2003 by Dr. Karilyn Cota revealed small hiatal  hernia.  No esophageal strictures were seen; however, after passing a 56  [place] Maloney dilator there was a small linear tear in the cervical  esophagus indicating narrowing.  In July 10, 2003 barium esophagogram was  normal.  At [facility] on June 24, 2003 EGD revealed small hiatal hernia, otherwise unremarkable study.   METHOD:  The esophageal manometry study was performed using [employer]  Medical Gastrosoft Upper Gastrointestinal Polygram, version 6.40, using an  Arndorfer infusion system at 0.5 cc/min. The lower esophageal sphincter  (LES) was identified in four ports with the standard station pull through  technique. For esophageal body pressures, the tip of the catheter was placed  3 cm above the proximal aspect of the LES. Ten wet swallows were taken with  ports 5 cm apart in the esophageal body. This procedure was reviewed with  the patient prior to performing it.   FINDINGS:  Esophageal body amplitude (mmHg):  84.9 at 13 cm (normal 38-102),  35.8 at 8 cm (normal 49-131), 52.5 at 3 cm (normal 64-154), 44.2 mean  (distal two ports) (normal 59-139).   Duration (seconds):  2.8 at 13 cm (normal 3-5), 4.8 at 8 cm (normal 3-5),  4.8 at 3 cm (normal 2.5-3.5), 4.8 mean (distal two ports) (normal 3.5).   Contractions (with wet swallows):  80% peristaltic, 20% low  amplitude.   Lower esophageal sphincter (LES):   Located at 45-42 cm (from nares).  Resting pressure:  9.8 mmHg.  Relaxation:  Appeared adequate   IMPRESSION:  Nonspecific motility disorder with evidence of good  peristalsis, although 20% of waves were of low amplitude especially in the  distal body.  She also had mildly hypotensive LES, resting pressure.  Some  of the tracings were quite poor; however, she did appear to have adequate  relaxation.   RECOMMENDATION:  Patient has had extensive evaluation in the past for  dysphagia including EGD with dilatation, barium esophagram and now  esophageal manometry.  She may ultimately need to follow up with [facility] regarding her dysphagia; however, will  have her follow up with Dr. Karilyn Cota in the meantime.     ________________________________________  ___________________________________________  Tana Coast, P.A.  Lionel December, M.D.   LL/MEDQ  D:  08/07/2003  T:  08/09/2003  Job:  [id number]

## 2011-01-20 NOTE — Discharge Summary (Signed)
NAME:  Renee Bridges, Renee Bridges                          ACCOUNT NO.:  [id number]   MEDICAL RECORD NO.:  [id number]                   PATIENT TYPE:  INP   LOCATION:  0460                                 FACILITY:  [facility]   PHYSICIAN:  Ronnell Guadalajara, M.D.                DATE OF BIRTH:  09/22/1945   DATE OF ADMISSION:  01/27/2003  DATE OF DISCHARGE:  01/30/2003                                 DISCHARGE SUMMARY   OPERATION:  On Jan 27, 2003, the patient underwent central lumbar  laminectomy decompression from L3 to the sacrum, Dr. Otelia Sergeant assisted.   CONSULTATIONS:  None.   BRIEF HISTORY:  A 65 year old lady with back pain with radiation to both  extremities. It was more so on the right than the left. Conservative care of  rest and exercises as well as epidural steroid injections did not give her  long lasting comfort. MRI had shown multifactorial spinal and lateral  recessed stenosis from L3 to S1. Facet disease was also noted. As the  patient was having increasing pain and discomfort, it was felt she would  benefit from surgical intervention and was admitted for the above procedure.   HOSPITAL COURSE:  The patient tolerated the surgical procedure quite well.  She had some mild tingling left over in her leg after the surgery but had  marked reduction in her overall discomfort. A drain which was placed  postoperatively was advanced the first postoperative day and then removed on  the second postoperative day. She had minimal drainage after that. On the  day of discharge, the wound was dry, a moderate amount of drainage from the  [place]rose drain site. The wound was redressed. The patient was anxious to go  home. Her boyfriend will take care of her and arrangements were made for  discharge. Neurovascularly was intact to the lower extremity. She was  voiding and had positive flatus.   LABORATORY DATA:  Laboratory values in the hospital hematologically showed a  preoperative hemoglobin and  hematocrit completely within normal limits as  well as a CBC. Final hemoglobin postop was 13.2 with a hematocrit of 38.0.  Blood chemistries were normal. Chest x-ray is not seen on this chart nor  electrocardiogram.   CONDITION ON DISCHARGE:  Improved, stable.   PLAN:  The patient is discharged home in the care of her boyfriend. Use dry  dressing over the back p.r.n. and dressings are sent home with the patient.  Continue with incentive spirometer. She will return to see Dr. Montez Morita in  about 7-10 days. Continue home medications and diet. We gave her a  prescription for Talwin NX one q. 4-6h p.r.n. pain, Robaxin 500 mg, 1-6h  p.r.n. muscle spasm.     Renee Bridges.                 Ronnell Guadalajara, M.D.    DLU/MEDQ  D:  01/30/2003  T:  01/31/2003  Job:  [id number]
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
## 2011-01-20 NOTE — H&P (Signed)
   NAME:  GILA, LAUF                          ACCOUNT NO.:  [id number]   MEDICAL RECORD NO.:  [id number]                   PATIENT TYPE:  INP   LOCATION:  A307                                 FACILITY:  [facility]   PHYSICIAN:  Kingsley Callander. Ouida Sills, M.D.                  DATE OF BIRTH:  [birth year]   DATE OF ADMISSION:  05/05/2003  DATE OF DISCHARGE:                                HISTORY & PHYSICAL   CHIEF COMPLAINT:  Pain on urination and vomiting.   HISTORY OF PRESENT ILLNESS:  This patient is a 65 year old white female who  presented to the office complaining of lower abdominal pain, nausea,  vomiting, and dysuria.  She was found to have 2+ white cells and 3+ red  cells with a cloudy appearing urine.  She had lower abdominal tenderness and  costovertebral angle tenderness all consistent with pyelonephritis.  She had  had fever and chills at home. Her symptoms had   Dictation ended at this point.                                               Kingsley Callander. Ouida Sills, M.D.    ROF/MEDQ  D:  05/06/2003  T:  05/06/2003  Job:  [id number]

## 2011-01-20 NOTE — H&P (Signed)
NAME:  Renee Bridges, Renee Bridges                          ACCOUNT NO.:  [id number]   MEDICAL RECORD NO.:  [id number]                   PATIENT TYPE:  AMB   LOCATION:  DAY                                  FACILITY:  [facility]   PHYSICIAN:  Raynelle Fanning B. Payton Emerald, P.A.                 DATE OF BIRTH:  [birth year]   DATE OF ADMISSION:  04/11/2002  DATE OF DISCHARGE:  04/11/2002                                HISTORY & PHYSICAL   CHIEF COMPLAINT:  Painful mass to the right flank area.   HISTORY OF PRESENT ILLNESS:  This is a 65 year old female who has painful  mass about the right flank for about one year.  She has a history of a  benign eye lipoma removed from her back in the past.  It has come to a point  where it is interfering with her activities of daily living, it just hurts  to touch her when she moves about.  It was felt she would benefit from  undergoing removal of this.  The risks and benefits, as well as the  procedure, was discussed with the patient and she elected to proceed.   ALLERGIES:  No known drug allergies.   MEDICATIONS:  1. Singular.  2. Ecodilat.  3. Effexor.  4. Hydrocodone.  5. Tussionex.  6. Albuterol nebulizer.  She will bring these medications to the hospital with her for verification  of dosages.   PAST MEDICAL HISTORY:  1. Depression.  2. Anxiety.  3. Asthma.  4. Seasonal allergies.   PAST SURGICAL HISTORY:  1. Bilateral foot surgeries.  2. Bunionectomies.  3. Lumbar decompression in the distant past.  4. Benign lipoma removal in the distant past.   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  She denies any alcohol  or tobacco use.  She has two children.  She lives in an apartment, one  level.   FAMILY HISTORY:  Significant for lung cancer, heart disease.   REVIEW OF SYMPTOMS:  GENERAL:  Recent cough and hoarseness secondary to some  postnasal drainage.  PULMONARY: No shortness of breath or hemoptysis.  CARDIOVASCULAR:  No chest pain or angina.  GASTROURINARY:  Positive for occasional dysuria, no hematuria or discharge.  GASTROINTESTINAL:  No nausea, vomiting, or diarrhea.  PSYCH:  Negative.  MUSCULOSKELETAL:  Low back pain as discussed up above.   PHYSICAL EXAMINATION:  GENERAL:  Alert and oriented, well-developed, well-  nourished, 65 year old female.  She is hard of hearing, wears hearing aids  bilaterally.  HEENT:  Head is normocephalic, atraumatic.  Oropharynx is clear.  NECK:  Supple, negative for cervical lymphadenopathy.  CHEST:  Clear to auscultation bilaterally.  No wheezes, rhonchi, or rales  heard.  BREASTS:  Not pertinent to present illness.  HEART:  S1 and S2, negative for murmurs, rubs, or gallops.  Heart is regular  rate and rhythm.  ABDOMEN:  Soft, nontender, positive  bowel sounds.  GASTROURINARY:  Not pertinent to present illness.  EXTREMITIES:  She has a palpable superficial mobile mass to her right flank  area that is tender.  No surrounding erythema or warmth.  SKIN:  Otherwise intact.   LABORATORY DATA:  Preoperative labs and x-rays are pending.   IMPRESSION:  Lipoma, right flank.   PLAN:  Removal of right flank lipoma by Dr. Ronnell Guadalajara.                                                Bradd Canary Clabe Seal.    JBB/MEDQ  D:  04/08/2002  T:  04/13/2002  Job:  [id number]

## 2011-01-20 NOTE — Op Note (Signed)
   TNAMEELENORA, HAWBAKER                         ACCOUNT NO.:  [id number]   MEDICAL RECORD NO.:  [id number]                   PATIENT TYPE:  AMB   LOCATION:  DAY                                  FACILITY:  [facility]   PHYSICIAN:  Deloris Ping. Montez Morita, M.D.              DATE OF BIRTH:  Mar 15, 1946   DATE OF PROCEDURE:  04/11/2002  DATE OF DISCHARGE:                                 OPERATIVE REPORT   PREOPERATIVE DIAGNOSIS:  Lipoma, right mid back.   POSTOPERATIVE DIAGNOSIS:  Lipoma, right mid back.   DESCRIPTION OF PROCEDURE:  Surgical removal.   DESCRIPTION OF PROCEDURE:  After suitable general anesthesia, she was  positioned in the left lateral decubitus and the area of the back along the  lipoma prepped and draped routinely. After injection with 0.5% Marcaine with  adrenaline, an incision is made through the fat tissue exposing the lipoma  which is lemon size which is shelled out with finger dissection. Additional  Marcaine with adrenaline after which closure with 2-0 coated Vicryl followed  by subcuticular Monocryl with Steri-Strips. A nice compression dressing,  goes to recovery in good condition.                                               Deloris Ping. Montez Morita, M.D.    PJC/MEDQ  D:  04/11/2002  T:  04/12/2002  Job:  [id number]

## 2011-01-20 NOTE — Op Note (Signed)
   NAME:  Renee Bridges, Renee Bridges                          ACCOUNT NO.:  [id number]   MEDICAL RECORD NO.:  [id number]                   PATIENT TYPE:  INP   LOCATION:  0008                                 [facility]ACILITY[facility]   PHYSICIAN:  Ronnell Guadalajara, M.D.                DATE OF BIRTH:  1946/09/02   DATE OF PROCEDURE:  01/27/2003  DATE OF DISCHARGE:                                 OPERATIVE REPORT   PREOPERATIVE DIAGNOSES:  1. Central stenosis with lateral recess stenosis, L3-4 and L4-5  2. Foraminal narrowing L5-S1, left.   POSTOPERATIVE DIAGNOSES:  1. Central stenosis with lateral recess stenosis, L3-4 and L4-5  2. Foraminal narrowing L5-S1, left.   OPERATION/PROCEDURE:  Central laminectomy decompression from L3 to the  sacrum.   SURGEON:  Ronnell Guadalajara, M.D.   ASSISTANT:  Kerrin Champagne, M.D.   DESCRIPTION OF PROCEDURE:  __________ general anesthesia, she was positioned  on the laminectomy frame and the back prepped and draped routinely.  Two  needles were placed identifying the fourth and fifth vertebrae after which a  midline incision was made and a clamp was placed on the fourth vertebral  spinous process and x-ray confirmed this location.  We then exposed L3 to  the sacrum and the central laminectomy is begun at the lower edge of L4 and  continued northward through the body of L4 and up into L3 so that the L3-4  lateral recess can be decompressed as well above.  We then go backwards  through the central area of the L5 all the way into the sacrum, particularly  on the left __________  foraminotomy at the L5-S1 on the left.  The  microscope was brought in shortly after bone work was begun most of the time  with the help of the microscope.  All the recesses were cleaned.  The  foramina were probed to make sure they were widely opened with the hockey  stick retractor.  Good bleeding control with bone wax and at the end of the  procedure, copious irrigation, some Gelfoam over the  area. Wound closed over  a small Penrose drain.  #1 in the muscle, 2-0 in the subcu and staples in  the skin.  She lost 200 mL of blood.  Tolerated the surgery well.  No  evidence of dural leakage.  Nice compression dressing at the end.  Goes to  recovery in good condition.                                                Ronnell Guadalajara, M.D.    PC/MEDQ  D:  01/27/2003  T:  01/27/2003  Job:  [id number]

## 2011-01-20 NOTE — Op Note (Signed)
Renee Bridges, Renee Bridges                ACCOUNT NO.:  [id number]   MEDICAL RECORD NO.:  [id number]          PATIENT TYPE:  INP   LOCATION:  X007                         FACILITY:  [facility]   PHYSICIAN:  Marlowe Kays, M.D.  DATE OF BIRTH:  [birth year]   DATE OF PROCEDURE:  07/26/2005  DATE OF DISCHARGE:                                 OPERATIVE REPORT   PREOPERATIVE DIAGNOSIS:  Osteoarthritis left knee.   POSTOPERATIVE DIAGNOSIS:  Osteoarthritis left knee.   OPERATION:  Osteonics total knee replacement, left.   SURGEON:  Marlowe Kays, M.D.   ASSISTANT:  Mr. Idolina Primer, P.A.-C.   ANESTHESIA:  General.   PATHOLOGY AND JUSTIFICATION FOR PROCEDURE:  She had had a left knee  arthroscopy demonstrating chondromalacia of the medial femoral condyle and  medial tibial plateau. Because of continued pain, she had  viscosupplementation but has failed it and is here today for the surgery  because of continued pain. At surgery, I found a cobblestone appearance to  the patella, medial femoral condyle and medial tibial plateau.   DESCRIPTION OF PROCEDURE:  Prophylactic antibiotics, satisfactory general  anesthesia, pneumatic tourniquet, SureFoot, left leg duraprepped from  tourniquet to ankle, draped in a sterile field, coban employed. The leg was  esmarched out sterilely, midline incision down to the patellar mechanism  with median parapatellar incision opening the joint. Pes anserinus and the  medial collateral ligament were partially stripped off of the proximal  tibia. The patella mechanism was freed up, everted and the knee flexed.  Osteophytes were removed from the femur, remnants of the menisci and ACL,  PCL were removed. The pathology was noted. I made a 5/16 inch drill hole in  the distal femur followed by the canal finder and the instrument for  creating a distal femoral cut with a 5 degrees valgus placement. A 10 mm cut  off the distal femur was made, she had no flexion contracture. I  then  incised the femoral component using the sizing guide at a #5, scribe holes  were placed and the distal femoral cutting jig was then placed and anterior  and posterior cuts and posterior and anterior chamferings were then made. I  then went to the proximal tibia were the remnants of the menisci were  removed and remnants of the ACL and PCL were also removed. I sized the tibia  at a #5, baseplate was placed and initial intramedullary cup followed by the  step-cut drill and canal finder then placed. I followed this with the  intramedullary rod and the external cutting jig set for 4 mm cut with zero  degrees off the medial tibial plateau. After making this cut, I then  returned to the femur where the guide for creating the patella groove and  the slot for the post was then applied and these respective cuts made. I  then used a laminar spreader and bone hook to remove remnants of bone and  soft tissue from behind the femoral condyle. We then went through a trial  reduction. The femoral component fit perfectly and a 10 mm spacer was of  adequate  size so that no additional bone had to be removed from the proximal  tibia. While the knee was in extension, I sized the patella at 26 and placed  the 10 mm recessed cutting jig making a 10 mm recessed cut followed by the  guide for creating 3 fixation holes for the patella. A trial patella was  then placed and excess bone trimmed around the patella. I returned to the  tibia where the base plate which we had previously lined up with external  rods splitting the bimalleolar distance and marked on the tibia. It was then  stabilized with 3 pins and a tower apparatus was made for creating the  tibial keel up to a 5 cemented. The knee was then water picked while the  methyl methacrylate was being mixed. With a glue gun technique, I then glued  in the three components starting first with the tibia impacting it tightly,  the femur with the same and holding  the knee in extension with a 10 mm  spacer and followed this with the patella with holding forceps trimming up  excess methyl methacrylate. When the glue had hardened, we removed small  amounts of particulate glue from around the components and went through  another set of trial reductions finding that a 12 mm posterior stabilized  insert seemed to be a little more stable and not create any recurvatum as  opposed to the 10. Consequently after irrigating the wound well, the final  12 mm posterior stabilized insert was placed, motion checked, no lateral  release was required. I then placed the hemovac and closed the wound over it  with interrupted #1 Vicryl in two layers in the quadriceps and distally in 2  layers in the synovium and capsule, a combination of #1 and 2-0 Vicryl in  the subcutaneous tissue and staples in the skin. Betadine Adaptic dry  sterile dressing were applied, tourniquet was released, knee immobilizer  applied. She tolerated the procedure well and at the time of this dictation  was on her way to the recovery room in satisfactory condition with no known  complications.           ______________________________  Marlowe Kays, M.D.     JA/MEDQ  D:  07/26/2005  T:  07/26/2005  Job:  [id number]

## 2013-01-02 ENCOUNTER — Other Ambulatory Visit (HOSPITAL_COMMUNITY): Payer: Self-pay | Admitting: Internal Medicine

## 2013-01-02 DIAGNOSIS — Z139 Encounter for screening, unspecified: Secondary | ICD-10-CM

## 2013-01-10 ENCOUNTER — Other Ambulatory Visit (HOSPITAL_COMMUNITY): Payer: PRIVATE HEALTH INSURANCE

## 2013-01-10 ENCOUNTER — Ambulatory Visit (HOSPITAL_COMMUNITY): Payer: PRIVATE HEALTH INSURANCE

## 2013-01-14 ENCOUNTER — Ambulatory Visit (HOSPITAL_COMMUNITY)
Admission: RE | Admit: 2013-01-14 | Discharge: 2013-01-14 | Disposition: A | Discharge status: Home / Self Care | Payer: PRIVATE HEALTH INSURANCE | Source: Ambulatory Visit | Attending: Internal Medicine | Admitting: Internal Medicine

## 2013-01-14 DIAGNOSIS — Z780 Asymptomatic menopausal state: Secondary | ICD-10-CM | POA: Insufficient documentation

## 2013-01-14 DIAGNOSIS — M899 Disorder of bone, unspecified: Secondary | ICD-10-CM | POA: Insufficient documentation

## 2013-01-14 DIAGNOSIS — Z139 Encounter for screening, unspecified: Secondary | ICD-10-CM

## 2013-01-14 DIAGNOSIS — Z13820 Encounter for screening for osteoporosis: Secondary | ICD-10-CM | POA: Insufficient documentation

## 2013-01-14 DIAGNOSIS — Z1231 Encounter for screening mammogram for malignant neoplasm of breast: Secondary | ICD-10-CM | POA: Insufficient documentation

## 2013-01-15 ENCOUNTER — Other Ambulatory Visit (HOSPITAL_COMMUNITY): Payer: PRIVATE HEALTH INSURANCE

## 2013-02-10 ENCOUNTER — Other Ambulatory Visit (HOSPITAL_COMMUNITY)
Admission: RE | Admit: 2013-02-10 | Discharge: 2013-02-10 | Disposition: A | Discharge status: Home / Self Care | Payer: PRIVATE HEALTH INSURANCE | Source: Ambulatory Visit | Attending: Orthopaedic Surgery | Admitting: Orthopaedic Surgery

## 2013-02-10 DIAGNOSIS — M6530 Trigger finger, unspecified finger: Secondary | ICD-10-CM | POA: Insufficient documentation

## 2013-06-16 ENCOUNTER — Ambulatory Visit: Payer: Self-pay | Admitting: Gastroenterology

## 2013-06-16 LAB — HM COLONOSCOPY

## 2013-06-17 LAB — PATHOLOGY REPORT

## 2013-06-30 DIAGNOSIS — R310 Gross hematuria: Secondary | ICD-10-CM | POA: Insufficient documentation

## 2013-06-30 DIAGNOSIS — N3941 Urge incontinence: Secondary | ICD-10-CM | POA: Insufficient documentation

## 2015-01-15 ENCOUNTER — Other Ambulatory Visit (HOSPITAL_COMMUNITY): Payer: Self-pay | Admitting: Specialist

## 2015-01-15 DIAGNOSIS — Z96652 Presence of left artificial knee joint: Secondary | ICD-10-CM

## 2015-01-15 DIAGNOSIS — M25572 Pain in left ankle and joints of left foot: Secondary | ICD-10-CM

## 2015-01-15 DIAGNOSIS — G8929 Other chronic pain: Secondary | ICD-10-CM

## 2015-01-21 ENCOUNTER — Ambulatory Visit (HOSPITAL_COMMUNITY)
Admission: RE | Admit: 2015-01-21 | Discharge: 2015-01-21 | Disposition: A | Discharge status: Home / Self Care | Payer: Medicaid Other | Source: Ambulatory Visit | Attending: Specialist | Admitting: Specialist

## 2015-01-21 ENCOUNTER — Other Ambulatory Visit (HOSPITAL_COMMUNITY): Payer: Self-pay | Admitting: Specialist

## 2015-01-21 DIAGNOSIS — G8929 Other chronic pain: Secondary | ICD-10-CM

## 2015-01-21 DIAGNOSIS — M25562 Pain in left knee: Secondary | ICD-10-CM | POA: Diagnosis not present

## 2015-01-21 DIAGNOSIS — M25572 Pain in left ankle and joints of left foot: Secondary | ICD-10-CM

## 2015-01-21 DIAGNOSIS — Z96652 Presence of left artificial knee joint: Secondary | ICD-10-CM

## 2015-01-21 DIAGNOSIS — M7989 Other specified soft tissue disorders: Secondary | ICD-10-CM | POA: Diagnosis not present

## 2015-01-21 MED ORDER — TECHNETIUM TC 99M MEDRONATE IV KIT
25.0000 | PACK | Freq: Once | INTRAVENOUS | Status: AC | PRN
Start: 2015-01-21 — End: 2015-01-21
  Administered 2015-01-21: 26.1 via INTRAVENOUS

## 2015-02-19 ENCOUNTER — Other Ambulatory Visit: Payer: Self-pay | Admitting: Orthopedic Surgery

## 2015-02-19 DIAGNOSIS — T8484XA Pain due to internal orthopedic prosthetic devices, implants and grafts, initial encounter: Secondary | ICD-10-CM

## 2015-02-19 DIAGNOSIS — Z96659 Presence of unspecified artificial knee joint: Principal | ICD-10-CM

## 2015-02-25 ENCOUNTER — Ambulatory Visit
Admission: RE | Admit: 2015-02-25 | Discharge: 2015-02-25 | Disposition: A | Discharge status: Home / Self Care | Payer: PRIVATE HEALTH INSURANCE | Source: Ambulatory Visit | Attending: Orthopedic Surgery | Admitting: Orthopedic Surgery

## 2015-02-25 DIAGNOSIS — T8484XA Pain due to internal orthopedic prosthetic devices, implants and grafts, initial encounter: Secondary | ICD-10-CM

## 2015-02-25 DIAGNOSIS — Z96659 Presence of unspecified artificial knee joint: Principal | ICD-10-CM

## 2015-03-05 HISTORY — PX: LEG SURGERY: SHX1003

## 2015-03-18 ENCOUNTER — Telehealth: Payer: Self-pay | Admitting: Unknown Physician Specialty

## 2015-03-18 NOTE — Telephone Encounter (Signed)
Pt scheduled for surgical clearance 03/26/15 @ 11am. Thanks.

## 2015-03-18 NOTE — Telephone Encounter (Signed)
Called pt and left voicemail to return call regarding appt for surgical clearance. Thanks.

## 2015-03-25 DIAGNOSIS — F329 Major depressive disorder, single episode, unspecified: Secondary | ICD-10-CM

## 2015-03-25 DIAGNOSIS — F32A Depression, unspecified: Secondary | ICD-10-CM

## 2015-03-25 DIAGNOSIS — F419 Anxiety disorder, unspecified: Secondary | ICD-10-CM

## 2015-03-26 ENCOUNTER — Encounter: Payer: Self-pay | Admitting: Unknown Physician Specialty

## 2015-03-26 ENCOUNTER — Ambulatory Visit (INDEPENDENT_AMBULATORY_CARE_PROVIDER_SITE_OTHER): Payer: Medicare Other | Admitting: Unknown Physician Specialty

## 2015-03-26 VITALS — BP 155/88 | HR 77 | Temp 97.5°F | Ht 62.7 in | Wt 191.8 lb

## 2015-03-26 DIAGNOSIS — Z01818 Encounter for other preprocedural examination: Secondary | ICD-10-CM

## 2015-03-26 DIAGNOSIS — F329 Major depressive disorder, single episode, unspecified: Secondary | ICD-10-CM | POA: Diagnosis not present

## 2015-03-26 DIAGNOSIS — F32A Depression, unspecified: Secondary | ICD-10-CM

## 2015-03-26 MED ORDER — CITALOPRAM HYDROBROMIDE 20 MG PO TABS: 20.0000 mg | ORAL_TABLET | Freq: Every day | ORAL | Status: DC

## 2015-03-26 NOTE — Progress Notes (Signed)
   BP 155/88 mmHg  Pulse 77  Temp(Src) 97.5 F (36.4 C)  Ht 5' 2.7" (1.593 m)  Wt 191 lb 12.8 oz (87 kg)  BMI 34.28 kg/m2  SpO2 97%  LMP  (LMP Unknown)   Subjective:    Patient ID: Renee Bridges, female    DOB: 1945-11-07, 69 y.o.   MRN: [id number]  HPI: Renee Bridges is a 69 y.o. female  Chief Complaint  Patient presents with  . Follow-up    pt is here for surgical clearence   Surgical clearance for left knee revision Pt denies chest pain or SOB.  No problems with anesthesia in the past.  BP a little high today which is the first time we have noticed this.  She is not on BP meds.    Relevant past medical, surgical, family and social history reviewed and updated as indicated. Interim medical history since our last visit reviewed. Allergies and medications reviewed and updated.  Review of Systems  Constitutional: Positive for fatigue and unexpected weight change.       Gained weight  HENT: Positive for postnasal drip and sinus pressure.   Eyes: Negative.   Respiratory: Negative.   Cardiovascular: Negative.   Gastrointestinal: Positive for nausea and diarrhea.  Genitourinary:       Problems with incontinence  Musculoskeletal: Positive for back pain and joint swelling.  Psychiatric/Behavioral:       Depression and anxiety    Per HPI unless specifically indicated above     Objective:    BP 155/88 mmHg  Pulse 77  Temp(Src) 97.5 F (36.4 C)  Ht 5' 2.7" (1.593 m)  Wt 191 lb 12.8 oz (87 kg)  BMI 34.28 kg/m2  SpO2 97%  LMP  (LMP Unknown)  Wt Readings from Last 3 Encounters:  03/26/15 191 lb 12.8 oz (87 kg)  01/21/15 183 lb (83.008 kg)    Physical Exam  Constitutional: She is oriented to person, place, and time. She appears well-developed and well-nourished. No distress.  HENT:  Head: Normocephalic and atraumatic.  Eyes: Conjunctivae and lids are normal. Right eye exhibits no discharge. Left eye exhibits no discharge. No scleral icterus.  Cardiovascular: Normal  rate and regular rhythm.   Pulmonary/Chest: Effort normal. No respiratory distress.  Abdominal: Normal appearance. There is no splenomegaly or hepatomegaly.  Musculoskeletal: Normal range of motion.  Neurological: She is alert and oriented to person, place, and time.  Skin: Skin is warm, dry and intact. No rash noted. No pallor.  Psychiatric: She has a normal mood and affect. Her behavior is normal. Judgment and thought content normal.  Vitals reviewed.  EKG without acute changes  Assessment & Plan:   Problem List Items Addressed This Visit    None    Visit Diagnoses    Pre-operative clearance    -  Primary    OK for surgery pending normal CMP and CBC.  Will need to follow up with me for borderline high blood pressure.      Relevant Orders    EKG 12-Lead (Completed)       Refill Citalopram as it seems that she has not been taking this  Follow up plan: Return in about 3 months (around 06/26/2015) for BP, diet, depression.

## 2015-03-26 NOTE — Patient Instructions (Signed)
DASH Eating Plan °DASH stands for "Dietary Approaches to Stop Hypertension." The DASH eating plan is a healthy eating plan that has been shown to reduce high blood pressure (hypertension). Additional health benefits may include reducing the risk of type 2 diabetes mellitus, heart disease, and stroke. The DASH eating plan may also help with weight loss. °WHAT DO I NEED TO KNOW ABOUT THE DASH EATING PLAN? °For the DASH eating plan, you will follow these general guidelines: °· Choose foods with a percent daily value for sodium of less than 5% (as listed on the food label). °· Use salt-free seasonings or herbs instead of table salt or sea salt. °· Check with your health care provider or pharmacist before using salt substitutes. °· Eat lower-sodium products, often labeled as "lower sodium" or "no salt added." °· Eat fresh foods. °· Eat more vegetables, fruits, and low-fat dairy products. °· Choose whole grains. Look for the word "whole" as the first word in the ingredient list. °· Choose fish and skinless chicken or turkey more often than red meat. Limit fish, poultry, and meat to 6 oz (170 g) each day. °· Limit sweets, desserts, sugars, and sugary drinks. °· Choose heart-healthy fats. °· Limit cheese to 1 oz (28 g) per day. °· Eat more home-cooked food and less restaurant, buffet, and fast food. °· Limit fried foods. °· Cook foods using methods other than frying. °· Limit canned vegetables. If you do use them, rinse them well to decrease the sodium. °· When eating at a restaurant, ask that your food be prepared with less salt, or no salt if possible. °WHAT FOODS CAN I EAT? °Seek help from a dietitian for individual calorie needs. °Grains °Whole grain or whole wheat bread. Brown rice. Whole grain or whole wheat pasta. Quinoa, bulgur, and whole grain cereals. Low-sodium cereals. Corn or whole wheat flour tortillas. Whole grain cornbread. Whole grain crackers. Low-sodium crackers. °Vegetables °Fresh or frozen vegetables  (raw, steamed, roasted, or grilled). Low-sodium or reduced-sodium tomato and vegetable juices. Low-sodium or reduced-sodium tomato sauce and paste. Low-sodium or reduced-sodium canned vegetables.  °Fruits °All fresh, canned (in natural juice), or frozen fruits. °Meat and Other Protein Products °Ground beef (85% or leaner), grass-fed beef, or beef trimmed of fat. Skinless chicken or turkey. Ground chicken or turkey. Pork trimmed of fat. All fish and seafood. Eggs. Dried beans, peas, or lentils. Unsalted nuts and seeds. Unsalted canned beans. °Dairy °Low-fat dairy products, such as skim or 1% milk, 2% or reduced-fat cheeses, low-fat ricotta or cottage cheese, or plain low-fat yogurt. Low-sodium or reduced-sodium cheeses. °Fats and Oils °Tub margarines without trans fats. Light or reduced-fat mayonnaise and salad dressings (reduced sodium). Avocado. Safflower, olive, or canola oils. Natural peanut or almond butter. °Other °Unsalted popcorn and pretzels. °The items listed above may not be a complete list of recommended foods or beverages. Contact your dietitian for more options. °WHAT FOODS ARE NOT RECOMMENDED? °Grains °White bread. White pasta. White rice. Refined cornbread. Bagels and croissants. Crackers that contain trans fat. °Vegetables °Creamed or fried vegetables. Vegetables in a cheese sauce. Regular canned vegetables. Regular canned tomato sauce and paste. Regular tomato and vegetable juices. °Fruits °Dried fruits. Canned fruit in light or heavy syrup. Fruit juice. °Meat and Other Protein Products °Fatty cuts of meat. Ribs, chicken wings, bacon, sausage, bologna, salami, chitterlings, fatback, hot dogs, bratwurst, and packaged luncheon meats. Salted nuts and seeds. Canned beans with salt. °Dairy °Whole or 2% milk, cream, half-and-half, and cream cheese. Whole-fat or sweetened yogurt. Full-fat   cheeses or blue cheese. Nondairy creamers and whipped toppings. Processed cheese, cheese spreads, or cheese  curds. °Condiments °Onion and garlic salt, seasoned salt, table salt, and sea salt. Canned and packaged gravies. Worcestershire sauce. Tartar sauce. Barbecue sauce. Teriyaki sauce. Soy sauce, including reduced sodium. Steak sauce. Fish sauce. Oyster sauce. Cocktail sauce. Horseradish. Ketchup and mustard. Meat flavorings and tenderizers. Bouillon cubes. Hot sauce. Tabasco sauce. Marinades. Taco seasonings. Relishes. °Fats and Oils °Butter, stick margarine, lard, shortening, ghee, and bacon fat. Coconut, palm kernel, or palm oils. Regular salad dressings. °Other °Pickles and olives. Salted popcorn and pretzels. °The items listed above may not be a complete list of foods and beverages to avoid. Contact your dietitian for more information. °WHERE CAN I FIND MORE INFORMATION? °National Heart, Lung, and Blood Institute: www.nhlbi.nih.gov/health/health-topics/topics/dash/ °Document Released: 08/10/2011 Document Revised: 01/05/2014 Document Reviewed: 06/25/2013 °ExitCare® Patient Information ©2015 [employer]. This information is not intended to replace advice given to you by your health care provider. Make sure you discuss any questions you have with your health care provider. ° °

## 2015-03-27 LAB — CBC WITH DIFFERENTIAL/PLATELET
BASOS: 0 %
Basophils Absolute: 0 10*3/uL (ref 0.0–0.2)
EOS (ABSOLUTE): 0.1 10*3/uL (ref 0.0–0.4)
Eos: 2 %
HEMATOCRIT: 46.5 % (ref 34.0–46.6)
HEMOGLOBIN: 15.6 g/dL (ref 11.1–15.9)
IMMATURE GRANS (ABS): 0 10*3/uL (ref 0.0–0.1)
IMMATURE GRANULOCYTES: 0 %
Lymphocytes Absolute: 2.8 10*3/uL (ref 0.7–3.1)
Lymphs: 37 %
MCH: 32.8 pg (ref 26.6–33.0)
MCHC: 33.5 g/dL (ref 31.5–35.7)
MCV: 98 fL — ABNORMAL HIGH (ref 79–97)
MONOCYTES: 8 %
MONOS ABS: 0.6 10*3/uL (ref 0.1–0.9)
Neutrophils Absolute: 4 10*3/uL (ref 1.4–7.0)
Neutrophils: 53 %
Platelets: 221 10*3/uL (ref 150–379)
RBC: 4.76 x10E6/uL (ref 3.77–5.28)
RDW: 13.1 % (ref 12.3–15.4)
WBC: 7.5 10*3/uL (ref 3.4–10.8)

## 2015-03-27 LAB — COMPREHENSIVE METABOLIC PANEL
A/G RATIO: 1.8 (ref 1.1–2.5)
ALBUMIN: 4.3 g/dL (ref 3.5–4.8)
ALT: 17 IU/L (ref 0–32)
AST: 25 IU/L (ref 0–40)
Alkaline Phosphatase: 74 IU/L (ref 39–117)
BUN/Creatinine Ratio: 11 (ref 11–26)
BUN: 12 mg/dL (ref 8–27)
Bilirubin Total: 0.5 mg/dL (ref 0.0–1.2)
CALCIUM: 9.8 mg/dL (ref 8.7–10.3)
CO2: 24 mmol/L (ref 18–29)
CREATININE: 1.05 mg/dL — AB (ref 0.57–1.00)
Chloride: 99 mmol/L (ref 97–108)
GFR calc Af Amer: 62 mL/min/{1.73_m2} (ref >59)
GFR, EST NON AFRICAN AMERICAN: 54 mL/min/{1.73_m2} — AB (ref >59)
GLOBULIN, TOTAL: 2.4 g/dL (ref 1.5–4.5)
Glucose: 91 mg/dL (ref 65–99)
Potassium: 5 mmol/L (ref 3.5–5.2)
Sodium: 139 mmol/L (ref 134–144)
Total Protein: 6.7 g/dL (ref 6.0–8.5)

## 2015-03-29 ENCOUNTER — Encounter: Payer: Self-pay | Admitting: Unknown Physician Specialty

## 2015-04-06 ENCOUNTER — Telehealth: Payer: Self-pay | Admitting: Unknown Physician Specialty

## 2015-04-06 NOTE — Telephone Encounter (Signed)
Ms. Basso Daughter, Alonna Buckler called to let you know that when pt had her MRI of lower back, they found a cyst of 6.4 cm and pt wanted you to know about it. Pt daughter stated that Dr. Shon Baton office from [facility] would forwards the MRI & Note. If you had any question to call pt daughter [name] at [phone].

## 2015-04-08 ENCOUNTER — Ambulatory Visit: Payer: Self-pay | Admitting: Orthopedic Surgery

## 2015-04-20 ENCOUNTER — Telehealth: Payer: Self-pay

## 2015-04-20 NOTE — Telephone Encounter (Signed)
Called and spoke to patient's daughter who states that in the last appointment incontinence was mentioned. So they do not understand why they need another appointment to do the paperwork for adult diapers when it was mentioned. Patient's daughter states the patient is going to have surgery on September 8th and do not want to drive from [place] if they do not have too. Does she still need to be seen?

## 2015-04-20 NOTE — Telephone Encounter (Signed)
Called and got answers from patient's daughter. Paperwork faxed back to company.

## 2015-04-20 NOTE — Telephone Encounter (Signed)
If there is a form to fill out?  If so, if someone is willing to call the patient's daughter and get the answers to the questions, it is OK not to come in again

## 2015-05-05 NOTE — Patient Instructions (Addendum)
Renee Bridges  05/05/2015   Your procedure is scheduled on: 05/13/2015    Report to [facility]  Entrance take [facility]  elevators to 3rd floor to  Short Stay Center at    1100 AM.  Call this number if you have problems the morning of surgery [phone]   Remember: ONLY 1 PERSON MAY GO WITH YOU TO SHORT STAY TO GET  READY MORNING OF YOUR SURGERY.  Do not eat food or drink liquids :After Midnight.     Take these medicines the morning of surgery with A SIP OF WATER:  Celexa                                You may not have any metal on your body including hair pins and              piercings  Do not wear jewelry, make-up, lotions, powders or perfumes, deodorant             Do not wear nail polish.  Do not shave  48 hours prior to surgery.             Do not bring valuables to the hospital. [facility] IS NOT             RESPONSIBLE   FOR VALUABLES.  Contacts, dentures or bridgework may not be worn into surgery.  Leave suitcase in the car. After surgery it may be brought to your room.       Special Instructions: coughing and deep breathing exercises, leg exercises               Please read over the following fact sheets you were given: _____________________________________________________________________             [facility] - Preparing for Surgery Before surgery, you can play an important role.  Because skin is not sterile, your skin needs to be as free of germs as possible.  You can reduce the number of germs on your skin by washing with CHG (chlorahexidine gluconate) soap before surgery.  CHG is an antiseptic cleaner which kills germs and bonds with the skin to continue killing germs even after washing. Please DO NOT use if you have an allergy to CHG or antibacterial soaps.  If your skin becomes reddened/irritated stop using the CHG and inform your nurse when you arrive at Short Stay. Do not shave (including legs and underarms) for at least 48 hours prior  to the first CHG shower.  You may shave your face/neck. Please follow these instructions carefully:  1.  Shower with CHG Soap the night before surgery and the  morning of Surgery.  2.  If you choose to wash your hair, wash your hair first as usual with your  normal  shampoo.  3.  After you shampoo, rinse your hair and body thoroughly to remove the  shampoo.                           4.  Use CHG as you would any other liquid soap.  You can apply chg directly  to the skin and wash                       Gently with a scrungie or clean washcloth.  5.  Apply the CHG Soap to your body ONLY FROM THE NECK DOWN.   Do not use on face/ open                           Wound or open sores. Avoid contact with eyes, ears mouth and genitals (private parts).                       Wash face,  Genitals (private parts) with your normal soap.             6.  Wash thoroughly, paying special attention to the area where your surgery  will be performed.  7.  Thoroughly rinse your body with warm water from the neck down.  8.  DO NOT shower/wash with your normal soap after using and rinsing off  the CHG Soap.                9.  Pat yourself dry with a clean towel.            10.  Wear clean pajamas.            11.  Place clean sheets on your bed the night of your first shower and do not  sleep with pets. Day of Surgery : Do not apply any lotions/deodorants the morning of surgery.  Please wear clean clothes to the hospital/surgery center.  FAILURE TO FOLLOW THESE INSTRUCTIONS MAY RESULT IN THE CANCELLATION OF YOUR SURGERY PATIENT SIGNATURE_________________________________  NURSE SIGNATURE__________________________________  ________________________________________________________________________  WHAT IS A BLOOD TRANSFUSION? Blood Transfusion Information  A transfusion is the replacement of blood or some of its parts. Blood is made up of multiple cells which provide different functions.  Red blood cells carry oxygen  and are used for blood loss replacement.  White blood cells fight against infection.  Platelets control bleeding.  Plasma helps clot blood.  Other blood products are available for specialized needs, such as hemophilia or other clotting disorders. BEFORE THE TRANSFUSION  Who gives blood for transfusions?   Healthy volunteers who are fully evaluated to make sure their blood is safe. This is blood bank blood. Transfusion therapy is the safest it has ever been in the practice of medicine. Before blood is taken from a donor, a complete history is taken to make sure that person has no history of diseases nor engages in risky social behavior (examples are intravenous drug use or sexual activity with multiple partners). The donor's travel history is screened to minimize risk of transmitting infections, such as malaria. The donated blood is tested for signs of infectious diseases, such as HIV and hepatitis. The blood is then tested to be sure it is compatible with you in order to minimize the chance of a transfusion reaction. If you or a relative donates blood, this is often done in anticipation of surgery and is not appropriate for emergency situations. It takes many days to process the donated blood. RISKS AND COMPLICATIONS Although transfusion therapy is very safe and saves many lives, the main dangers of transfusion include:  1. Getting an infectious disease. 2. Developing a transfusion reaction. This is an allergic reaction to something in the blood you were given. Every precaution is taken to prevent this. The decision to have a blood transfusion has been considered carefully by your caregiver before blood is given. Blood is not given unless the benefits outweigh the risks. AFTER THE TRANSFUSION  Right after  receiving a blood transfusion, you will usually feel much better and more energetic. This is especially true if your red blood cells have gotten low (anemic). The transfusion raises the level  of the red blood cells which carry oxygen, and this usually causes an energy increase.  The nurse administering the transfusion will monitor you carefully for complications. HOME CARE INSTRUCTIONS  No special instructions are needed after a transfusion. You may find your energy is better. Speak with your caregiver about any limitations on activity for underlying diseases you may have. SEEK MEDICAL CARE IF:   Your condition is not improving after your transfusion.  You develop redness or irritation at the intravenous (IV) site. SEEK IMMEDIATE MEDICAL CARE IF:  Any of the following symptoms occur over the next 12 hours:  Shaking chills.  You have a temperature by mouth above 102 F (38.9 C), not controlled by medicine.  Chest, back, or muscle pain.  People around you feel you are not acting correctly or are confused.  Shortness of breath or difficulty breathing.  Dizziness and fainting.  You get a rash or develop hives.  You have a decrease in urine output.  Your urine turns a dark color or changes to pink, red, or brown. Any of the following symptoms occur over the next 10 days:  You have a temperature by mouth above 102 F (38.9 C), not controlled by medicine.  Shortness of breath.  Weakness after normal activity.  The white part of the eye turns yellow (jaundice).  You have a decrease in the amount of urine or are urinating less often.  Your urine turns a dark color or changes to pink, red, or brown. Document Released: 08/18/2000 Document Revised: 11/13/2011 Document Reviewed: 04/06/2008 ExitCare Patient Information 2014 [place].  _______________________________________________________________________  Incentive Spirometer  An incentive spirometer is a tool that can help keep your lungs clear and active. This tool measures how well you are filling your lungs with each breath. Taking long deep breaths may help reverse or decrease the chance of developing  breathing (pulmonary) problems (especially infection) following:  A long period of time when you are unable to move or be active. BEFORE THE PROCEDURE   If the spirometer includes an indicator to show your best effort, your nurse or respiratory therapist will set it to a desired goal.  If possible, sit up straight or lean slightly forward. Try not to slouch.  Hold the incentive spirometer in an upright position. INSTRUCTIONS FOR USE  3. Sit on the edge of your bed if possible, or sit up as far as you can in bed or on a chair. 4. Hold the incentive spirometer in an upright position. 5. Breathe out normally. 6. Place the mouthpiece in your mouth and seal your lips tightly around it. 7. Breathe in slowly and as deeply as possible, raising the piston or the ball toward the top of the column. 8. Hold your breath for 3-5 seconds or for as long as possible. Allow the piston or ball to fall to the bottom of the column. 9. Remove the mouthpiece from your mouth and breathe out normally. 10. Rest for a few seconds and repeat Steps 1 through 7 at least 10 times every 1-2 hours when you are awake. Take your time and take a few normal breaths between deep breaths. 11. The spirometer may include an indicator to show your best effort. Use the indicator as a goal to work toward during each repetition. 12. After each set  of 10 deep breaths, practice coughing to be sure your lungs are clear. If you have an incision (the cut made at the time of surgery), support your incision when coughing by placing a pillow or rolled up towels firmly against it. Once you are able to get out of bed, walk around indoors and cough well. You may stop using the incentive spirometer when instructed by your caregiver.  RISKS AND COMPLICATIONS  Take your time so you do not get dizzy or light-headed.  If you are in pain, you may need to take or ask for pain medication before doing incentive spirometry. It is harder to take a deep  breath if you are having pain. AFTER USE  Rest and breathe slowly and easily.  It can be helpful to keep track of a log of your progress. Your caregiver can provide you with a simple table to help with this. If you are using the spirometer at home, follow these instructions: [place] IF:   You are having difficultly using the spirometer.  You have trouble using the spirometer as often as instructed.  Your pain medication is not giving enough relief while using the spirometer.  You develop fever of 100.5 F (38.1 C) or higher. SEEK IMMEDIATE MEDICAL CARE IF:   You cough up bloody sputum that had not been present before.  You develop fever of 102 F (38.9 C) or greater.  You develop worsening pain at or near the incision site. MAKE SURE YOU:   Understand these instructions.  Will watch your condition.  Will get help right away if you are not doing well or get worse. Document Released: 01/01/2007 Document Revised: 11/13/2011 Document Reviewed: 03/04/2007 [facility] Patient Information 2014 [place], [place].   ________________________________________________________________________

## 2015-05-06 ENCOUNTER — Encounter (HOSPITAL_COMMUNITY)
Admission: RE | Admit: 2015-05-06 | Discharge: 2015-05-06 | Disposition: A | Discharge status: Home / Self Care | Payer: Medicare Other | Source: Ambulatory Visit | Attending: Orthopedic Surgery | Admitting: Orthopedic Surgery

## 2015-05-06 ENCOUNTER — Encounter (HOSPITAL_COMMUNITY): Payer: Self-pay

## 2015-05-06 DIAGNOSIS — X58XXXA Exposure to other specified factors, initial encounter: Secondary | ICD-10-CM | POA: Insufficient documentation

## 2015-05-06 DIAGNOSIS — Z01812 Encounter for preprocedural laboratory examination: Secondary | ICD-10-CM | POA: Diagnosis present

## 2015-05-06 DIAGNOSIS — T84093A Other mechanical complication of internal left knee prosthesis, initial encounter: Secondary | ICD-10-CM | POA: Insufficient documentation

## 2015-05-06 HISTORY — DX: Unspecified osteoarthritis, unspecified site: M19.90

## 2015-05-06 HISTORY — DX: Unspecified convulsions: R56.9

## 2015-05-06 HISTORY — DX: Reserved for inherently not codable concepts without codable children: IMO0001

## 2015-05-06 HISTORY — DX: Headache: R51

## 2015-05-06 HISTORY — DX: Headache, unspecified: R51.9

## 2015-05-06 HISTORY — DX: Personal history of other diseases of the digestive system: Z87.19

## 2015-05-06 HISTORY — DX: Unspecified asthma, uncomplicated: J45.909

## 2015-05-06 LAB — COMPREHENSIVE METABOLIC PANEL
ALT: 21 U/L (ref 14–54)
AST: 30 U/L (ref 15–41)
Albumin: 4.4 g/dL (ref 3.5–5.0)
Alkaline Phosphatase: 69 U/L (ref 38–126)
Anion gap: 6 (ref 5–15)
BILIRUBIN TOTAL: 0.6 mg/dL (ref 0.3–1.2)
BUN: 13 mg/dL (ref 6–20)
CO2: 30 mmol/L (ref 22–32)
CREATININE: 1.22 mg/dL — AB (ref 0.44–1.00)
Calcium: 9.9 mg/dL (ref 8.9–10.3)
Chloride: 103 mmol/L (ref 101–111)
GFR calc Af Amer: 50 mL/min — ABNORMAL LOW (ref >60)
GFR, EST NON AFRICAN AMERICAN: 43 mL/min — AB (ref >60)
Glucose, Bld: 101 mg/dL — ABNORMAL HIGH (ref 65–99)
Potassium: 5.2 mmol/L — ABNORMAL HIGH (ref 3.5–5.1)
Sodium: 139 mmol/L (ref 135–145)
TOTAL PROTEIN: 7.9 g/dL (ref 6.5–8.1)

## 2015-05-06 LAB — CBC
HEMATOCRIT: 48.4 % — AB (ref 36.0–46.0)
Hemoglobin: 15.6 g/dL — ABNORMAL HIGH (ref 12.0–15.0)
MCH: 32.5 pg (ref 26.0–34.0)
MCHC: 32.2 g/dL (ref 30.0–36.0)
MCV: 100.8 fL — AB (ref 78.0–100.0)
Platelets: 218 10*3/uL (ref 150–400)
RBC: 4.8 MIL/uL (ref 3.87–5.11)
RDW: 12.7 % (ref 11.5–15.5)
WBC: 8.4 10*3/uL (ref 4.0–10.5)

## 2015-05-06 LAB — PROTIME-INR
INR: 0.98 (ref 0.00–1.49)
PROTHROMBIN TIME: 13.2 s (ref 11.6–15.2)

## 2015-05-06 LAB — APTT: aPTT: 38 seconds — ABNORMAL HIGH (ref 24–37)

## 2015-05-06 LAB — SURGICAL PCR SCREEN
MRSA, PCR: NEGATIVE
STAPHYLOCOCCUS AUREUS: NEGATIVE

## 2015-05-06 LAB — ABO/RH: ABO/RH(D): O POS

## 2015-05-06 NOTE — Progress Notes (Signed)
03-26-15 - LOV - Gabriel Cirri, NP (fam.med) - surgical clearance - EPIC 03-26-15 - EKG - EPIC Surgical clearance - Gabriel Cirri, NP - in charat

## 2015-05-06 NOTE — Progress Notes (Signed)
05-06-15 - PTT and CMP lab results from preop visit on 05-06-15 faxed to Dr. Linna Caprice via [facility]

## 2015-05-12 ENCOUNTER — Ambulatory Visit: Payer: Self-pay | Admitting: Orthopedic Surgery

## 2015-05-12 LAB — TYPE AND SCREEN
ABO/RH(D): O POS
Antibody Screen: NEGATIVE

## 2015-05-12 NOTE — H&P (Signed)
TOTAL KNEE REVISION ADMISSION H&P  Patient is being admitted for left revision total knee arthroplasty.  Subjective:  Chief Complaint:left knee pain.  HPI: Renee Bridges, 69 y.o. female, has a history of pain and functional disability in the left knee(s) due to failed previous arthroplasty and patient has failed non-surgical conservative treatments for greater than 12 weeks to include flexibility and strengthening excercises, use of assistive devices and activity modification. The indications for the revision of the total knee arthroplasty are bearing surface wear leading to symptomatic synovitis, implant or knee misalignment and tibiofemoral instability and patellar component malalignment. Onset of symptoms was gradual starting 1 years ago with gradually worsening course since that time.  Prior procedures on the left knee(s) include arthroplasty.  Patient currently rates pain in the left knee(s) at 10 out of 10 with activity. There is worsening of pain with activity and weight bearing and crepitus.  Patient has evidence of eccentric poly wear and patellar malposition by imaging studies. This condition presents safety issues increasing the risk of falls.   There is no current active infection.  Patient Active Problem List   Diagnosis Date Noted  . Depression 03/25/2015  . Anxiety 03/25/2015  . Frank hematuria 06/30/2013  . Urge incontinence 06/30/2013   Past Medical History  Diagnosis Date  . Depression   . Anxiety   . Asthma   . Shortness of breath dyspnea     with excertion  . Chronic kidney disease     cyst on kidney  . History of hiatal hernia   . Seizures     hx. as child  . Headache   . Arthritis     Past Surgical History  Procedure Laterality Date  . Abdominal hysterectomy    . Back surgery    . Leg surgery Left   . Mass removed from back - 20 years ago    . Colonoscopy a year ago       (Not in a hospital admission) Allergies  Allergen Reactions  . Codeine Rash     Social History  Substance Use Topics  . Smoking status: Never Smoker   . Smokeless tobacco: Never Used  . Alcohol Use: No    Family History  Problem Relation Age of Onset  . Cancer Mother   . Cancer Father   . Cancer Brother   . Cancer Brother       Review of Systems  Constitutional: Positive for chills. Negative for fever, weight loss, malaise/fatigue and diaphoresis.  HENT: Positive for hearing loss. Negative for congestion, ear discharge, ear pain, nosebleeds, sore throat and tinnitus.   Eyes: Negative.   Respiratory: Positive for shortness of breath. Negative for cough, hemoptysis, sputum production, wheezing and stridor.   Cardiovascular: Negative.   Gastrointestinal: Positive for heartburn. Negative for nausea, abdominal pain, constipation, blood in stool and melena.  Genitourinary: Negative.   Musculoskeletal: Positive for back pain and joint pain. Negative for myalgias, falls and neck pain.  Skin: Negative.   Neurological: Negative.  Negative for weakness and headaches.  Endo/Heme/Allergies: Negative.   Psychiatric/Behavioral: Negative.      Objective:  Physical Exam  Vitals reviewed. Constitutional: She is oriented to person, place, and time. She appears well-developed and well-nourished.  HENT:  Head: Normocephalic and atraumatic.  Eyes: Conjunctivae and EOM are normal. Pupils are equal, round, and reactive to light.  Neck: Normal range of motion. Neck supple.  Cardiovascular: Normal rate, normal heart sounds and intact distal pulses.   Respiratory: Effort  normal and breath sounds normal.  GI: Soft. Bowel sounds are normal. She exhibits no distension. There is no tenderness. There is no rebound.  Genitourinary:  deferred  Musculoskeletal:       Left knee: She exhibits decreased range of motion, abnormal patellar mobility and MCL laxity.  Neurological: She is alert and oriented to person, place, and time. She has normal reflexes.  Skin: Skin is warm and dry.   Psychiatric: She has a normal mood and affect. Her behavior is normal. Judgment and thought content normal.    Vital signs in last 24 hours: @  Labs:  Estimated body mass index is 33.67 kg/(m^2) as calculated from the following:   Height as of 05/06/15:  (1.6 m).   Weight as of 05/06/15: 86.183 kg (190 lb).  Imaging Review Plain radiographs demonstrate cemented left TKA in ood alignment without loosening. Patellar malposition and maltracking. Eccentric poly wear. The bone quality appears to be adequate for age and reported activity level.   Assessment/Plan:  End stage arthritis, left knee(s) with failed previous arthroplasty.   The patient history, physical examination, clinical judgment of the provider and imaging studies are consistent with end stage degenerative joint disease of the left knee(s), previous total knee arthroplasty. Revision total knee arthroplasty is deemed medically necessary. The treatment options including medical management, injection therapy, arthroscopy and revision arthroplasty were discussed at length. The risks and benefits of revision total knee arthroplasty were presented and reviewed. The risks due to aseptic loosening, infection, stiffness, patella tracking problems, thromboembolic complications and other imponderables were discussed. The patient acknowledged the explanation, agreed to proceed with the plan and consent was signed. Patient is being admitted for inpatient treatment for surgery, pain control, PT, OT, prophylactic antibiotics, VTE prophylaxis, progressive ambulation and ADL's and discharge planning.The patient is planning to be discharged home with home health services

## 2015-05-13 ENCOUNTER — Inpatient Hospital Stay (HOSPITAL_COMMUNITY)
Admission: RE | Admit: 2015-05-13 | Discharge: 2015-05-15 | DRG: 489 | Disposition: A | Discharge status: Home Health | Payer: Medicare Other | Source: Ambulatory Visit | Attending: Orthopedic Surgery | Admitting: Orthopedic Surgery

## 2015-05-13 ENCOUNTER — Inpatient Hospital Stay (HOSPITAL_COMMUNITY): Payer: Medicare Other | Admitting: Anesthesiology

## 2015-05-13 ENCOUNTER — Encounter (HOSPITAL_COMMUNITY): Payer: Self-pay | Admitting: *Deleted

## 2015-05-13 ENCOUNTER — Inpatient Hospital Stay (HOSPITAL_COMMUNITY): Payer: Medicare Other

## 2015-05-13 ENCOUNTER — Encounter (HOSPITAL_COMMUNITY)
Admission: RE | Disposition: A | Discharge status: Home Health | Payer: Self-pay | Source: Ambulatory Visit | Attending: Orthopedic Surgery

## 2015-05-13 DIAGNOSIS — K449 Diaphragmatic hernia without obstruction or gangrene: Secondary | ICD-10-CM | POA: Diagnosis present

## 2015-05-13 DIAGNOSIS — Z09 Encounter for follow-up examination after completed treatment for conditions other than malignant neoplasm: Secondary | ICD-10-CM

## 2015-05-13 DIAGNOSIS — T84093A Other mechanical complication of internal left knee prosthesis, initial encounter: Secondary | ICD-10-CM | POA: Diagnosis present

## 2015-05-13 DIAGNOSIS — Z01812 Encounter for preprocedural laboratory examination: Secondary | ICD-10-CM | POA: Diagnosis not present

## 2015-05-13 DIAGNOSIS — Y792 Prosthetic and other implants, materials and accessory orthopedic devices associated with adverse incidents: Secondary | ICD-10-CM | POA: Diagnosis not present

## 2015-05-13 DIAGNOSIS — M25562 Pain in left knee: Secondary | ICD-10-CM | POA: Diagnosis present

## 2015-05-13 HISTORY — PX: TOTAL KNEE REVISION: SHX996

## 2015-05-13 LAB — SYNOVIAL CELL COUNT + DIFF, W/ CRYSTALS
Crystals, Fluid: NONE SEEN
LYMPHOCYTES-SYNOVIAL FLD: 52 % — AB (ref 0–20)
Monocyte-Macrophage-Synovial Fluid: 48 % — ABNORMAL LOW (ref 50–90)
WBC, SYNOVIAL: 684 /mm3 — AB (ref 0–200)

## 2015-05-13 SURGERY — TOTAL KNEE REVISION
Anesthesia: Spinal | Site: Knee | Laterality: Left

## 2015-05-13 MED ORDER — KETOROLAC TROMETHAMINE 30 MG/ML IJ SOLN
INTRAMUSCULAR | Status: AC
  Filled 2015-05-13: qty 1

## 2015-05-13 MED ORDER — MIDAZOLAM HCL 2 MG/2ML IJ SOLN
INTRAMUSCULAR | Status: AC
  Filled 2015-05-13: qty 4

## 2015-05-13 MED ORDER — LIDOCAINE HCL (CARDIAC) 20 MG/ML IV SOLN
INTRAVENOUS | Status: AC
  Filled 2015-05-13: qty 5

## 2015-05-13 MED ORDER — CITALOPRAM HYDROBROMIDE 20 MG PO TABS
20.0000 mg | ORAL_TABLET | Freq: Every morning | ORAL | Status: DC
  Administered 2015-05-14 – 2015-05-15 (×2): 20 mg via ORAL
  Filled 2015-05-13 (×2): qty 1

## 2015-05-13 MED ORDER — SODIUM CHLORIDE 0.9 % IR SOLN
Status: DC | PRN
  Administered 2015-05-13: 3000 mL

## 2015-05-13 MED ORDER — INFLUENZA VAC SPLIT QUAD 0.5 ML IM SUSY
0.5000 mL | PREFILLED_SYRINGE | INTRAMUSCULAR | Status: AC | PRN
  Administered 2015-05-15: 0.5 mL via INTRAMUSCULAR
  Filled 2015-05-13: qty 0.5

## 2015-05-13 MED ORDER — PHENOL 1.4 % MT LIQD: 1.0000 | OROMUCOSAL | Status: DC | PRN

## 2015-05-13 MED ORDER — ONDANSETRON HCL 4 MG PO TABS: 4.0000 mg | ORAL_TABLET | Freq: Four times a day (QID) | ORAL | Status: DC | PRN

## 2015-05-13 MED ORDER — DIPHENHYDRAMINE HCL 12.5 MG/5ML PO ELIX: 12.5000 mg | ORAL_SOLUTION | ORAL | Status: DC | PRN

## 2015-05-13 MED ORDER — DEXAMETHASONE SODIUM PHOSPHATE 10 MG/ML IJ SOLN
10.0000 mg | Freq: Once | INTRAMUSCULAR | Status: AC
  Administered 2015-05-14: 10 mg via INTRAVENOUS
  Filled 2015-05-13: qty 1

## 2015-05-13 MED ORDER — CEFAZOLIN SODIUM-DEXTROSE 2-3 GM-% IV SOLR
2.0000 g | Freq: Four times a day (QID) | INTRAVENOUS | Status: AC
  Administered 2015-05-13 – 2015-05-14 (×2): 2 g via INTRAVENOUS
  Filled 2015-05-13 (×2): qty 50

## 2015-05-13 MED ORDER — PROPOFOL 10 MG/ML IV BOLUS
INTRAVENOUS | Status: AC
  Filled 2015-05-13: qty 20

## 2015-05-13 MED ORDER — DOCUSATE SODIUM 100 MG PO CAPS
100.0000 mg | ORAL_CAPSULE | Freq: Two times a day (BID) | ORAL | Status: DC
Start: 2015-05-13 — End: 2015-05-15
  Administered 2015-05-13 – 2015-05-15 (×4): 100 mg via ORAL
  Filled 2015-05-13 (×5): qty 1

## 2015-05-13 MED ORDER — ACETAMINOPHEN 10 MG/ML IV SOLN
INTRAVENOUS | Status: AC
  Filled 2015-05-13: qty 100

## 2015-05-13 MED ORDER — FENTANYL CITRATE (PF) 100 MCG/2ML IJ SOLN
INTRAMUSCULAR | Status: DC | PRN
  Administered 2015-05-13 (×2): 50 ug via INTRAVENOUS

## 2015-05-13 MED ORDER — ACETAMINOPHEN 10 MG/ML IV SOLN
1000.0000 mg | Freq: Once | INTRAVENOUS | Status: AC
  Administered 2015-05-13: 1000 mg via INTRAVENOUS

## 2015-05-13 MED ORDER — ONDANSETRON HCL 4 MG/2ML IJ SOLN
INTRAMUSCULAR | Status: AC
  Filled 2015-05-13: qty 2

## 2015-05-13 MED ORDER — SENNA 8.6 MG PO TABS
2.0000 | ORAL_TABLET | Freq: Every day | ORAL | Status: DC
  Administered 2015-05-13 – 2015-05-14 (×2): 17.2 mg via ORAL
  Filled 2015-05-13 (×2): qty 2

## 2015-05-13 MED ORDER — CEFAZOLIN SODIUM-DEXTROSE 2-3 GM-% IV SOLR
2.0000 g | INTRAVENOUS | Status: AC
  Administered 2015-05-13: 2 g via INTRAVENOUS

## 2015-05-13 MED ORDER — MIDAZOLAM HCL 5 MG/5ML IJ SOLN
INTRAMUSCULAR | Status: DC | PRN
  Administered 2015-05-13 (×2): 1 mg via INTRAVENOUS

## 2015-05-13 MED ORDER — KETOROLAC TROMETHAMINE 15 MG/ML IJ SOLN
INTRAMUSCULAR | Status: AC
  Filled 2015-05-13: qty 1

## 2015-05-13 MED ORDER — SODIUM CHLORIDE 0.9 % IV SOLN: INTRAVENOUS | Status: DC

## 2015-05-13 MED ORDER — HYDROCODONE-ACETAMINOPHEN 5-325 MG PO TABS
1.0000 | ORAL_TABLET | ORAL | Status: DC | PRN
  Administered 2015-05-13 – 2015-05-15 (×5): 1 via ORAL
  Filled 2015-05-13 (×5): qty 1

## 2015-05-13 MED ORDER — ONDANSETRON HCL 4 MG/2ML IJ SOLN: 4.0000 mg | Freq: Once | INTRAMUSCULAR | Status: DC | PRN

## 2015-05-13 MED ORDER — HYDROMORPHONE HCL 1 MG/ML IJ SOLN
0.2500 mg | INTRAMUSCULAR | Status: DC | PRN
  Administered 2015-05-13: 0.5 mg via INTRAVENOUS

## 2015-05-13 MED ORDER — CHLORHEXIDINE GLUCONATE 4 % EX LIQD: 60.0000 mL | Freq: Once | CUTANEOUS | Status: DC

## 2015-05-13 MED ORDER — METOCLOPRAMIDE HCL 5 MG/ML IJ SOLN: 5.0000 mg | Freq: Three times a day (TID) | INTRAMUSCULAR | Status: DC | PRN

## 2015-05-13 MED ORDER — BUPIVACAINE-EPINEPHRINE (PF) 0.25% -1:200000 IJ SOLN
INTRAMUSCULAR | Status: AC
  Filled 2015-05-13: qty 30

## 2015-05-13 MED ORDER — PROPOFOL INFUSION 10 MG/ML OPTIME
INTRAVENOUS | Status: DC | PRN
  Administered 2015-05-13: 50 ug/kg/min via INTRAVENOUS

## 2015-05-13 MED ORDER — TRANEXAMIC ACID 1000 MG/10ML IV SOLN
1000.0000 mg | INTRAVENOUS | Status: AC
  Administered 2015-05-13: 1000 mg via INTRAVENOUS
  Filled 2015-05-13: qty 10

## 2015-05-13 MED ORDER — ONDANSETRON HCL 4 MG/2ML IJ SOLN
4.0000 mg | Freq: Four times a day (QID) | INTRAMUSCULAR | Status: DC | PRN
  Administered 2015-05-15: 4 mg via INTRAVENOUS
  Filled 2015-05-13: qty 2

## 2015-05-13 MED ORDER — PROPOFOL 10 MG/ML IV BOLUS
INTRAVENOUS | Status: DC | PRN
  Administered 2015-05-13 (×2): 10 mg via INTRAVENOUS

## 2015-05-13 MED ORDER — KETOROLAC TROMETHAMINE 15 MG/ML IJ SOLN
15.0000 mg | Freq: Four times a day (QID) | INTRAMUSCULAR | Status: AC
  Administered 2015-05-13 – 2015-05-14 (×4): 15 mg via INTRAVENOUS
  Filled 2015-05-13 (×3): qty 1

## 2015-05-13 MED ORDER — CEFAZOLIN SODIUM-DEXTROSE 2-3 GM-% IV SOLR
INTRAVENOUS | Status: AC
  Filled 2015-05-13: qty 50

## 2015-05-13 MED ORDER — ONDANSETRON HCL 4 MG/2ML IJ SOLN
INTRAMUSCULAR | Status: DC | PRN
  Administered 2015-05-13: 4 mg via INTRAVENOUS

## 2015-05-13 MED ORDER — SODIUM CHLORIDE 0.9 % IJ SOLN
INTRAMUSCULAR | Status: AC
  Filled 2015-05-13: qty 50

## 2015-05-13 MED ORDER — APIXABAN 2.5 MG PO TABS
2.5000 mg | ORAL_TABLET | Freq: Two times a day (BID) | ORAL | Status: DC
  Administered 2015-05-14 – 2015-05-15 (×3): 2.5 mg via ORAL
  Filled 2015-05-13 (×5): qty 1

## 2015-05-13 MED ORDER — LACTATED RINGERS IV SOLN
INTRAVENOUS | Status: DC
Start: 2015-05-13 — End: 2015-05-13
  Administered 2015-05-13: 1000 mL via INTRAVENOUS
  Administered 2015-05-13: 16:00:00 via INTRAVENOUS

## 2015-05-13 MED ORDER — METOCLOPRAMIDE HCL 10 MG PO TABS: 5.0000 mg | ORAL_TABLET | Freq: Three times a day (TID) | ORAL | Status: DC | PRN

## 2015-05-13 MED ORDER — LIDOCAINE HCL (CARDIAC) 20 MG/ML IV SOLN
INTRAVENOUS | Status: DC | PRN
  Administered 2015-05-13: 660 mg via INTRAVENOUS

## 2015-05-13 MED ORDER — SODIUM CHLORIDE 0.9 % IV SOLN
INTRAVENOUS | Status: DC
  Administered 2015-05-13: 21:00:00 via INTRAVENOUS

## 2015-05-13 MED ORDER — HYDROGEN PEROXIDE 3 % EX SOLN
CUTANEOUS | Status: DC | PRN
  Administered 2015-05-13: 1

## 2015-05-13 MED ORDER — HYDROGEN PEROXIDE 3 % EX SOLN
CUTANEOUS | Status: AC
  Filled 2015-05-13: qty 473

## 2015-05-13 MED ORDER — MENTHOL 3 MG MT LOZG
1.0000 | LOZENGE | OROMUCOSAL | Status: DC | PRN
  Filled 2015-05-13: qty 9

## 2015-05-13 MED ORDER — ISOPROPYL ALCOHOL 70 % SOLN
Status: AC
  Filled 2015-05-13: qty 480

## 2015-05-13 MED ORDER — FENTANYL CITRATE (PF) 100 MCG/2ML IJ SOLN
INTRAMUSCULAR | Status: AC
  Filled 2015-05-13: qty 4

## 2015-05-13 MED ORDER — HYDROMORPHONE HCL 1 MG/ML IJ SOLN
0.5000 mg | INTRAMUSCULAR | Status: DC | PRN
  Administered 2015-05-13: 0.5 mg via INTRAVENOUS
  Filled 2015-05-13: qty 1

## 2015-05-13 MED ORDER — ACETAMINOPHEN 325 MG PO TABS
650.0000 mg | ORAL_TABLET | Freq: Four times a day (QID) | ORAL | Status: DC | PRN
  Administered 2015-05-14: 650 mg via ORAL
  Filled 2015-05-13: qty 2

## 2015-05-13 MED ORDER — HYDROMORPHONE HCL 1 MG/ML IJ SOLN
INTRAMUSCULAR | Status: AC
  Filled 2015-05-13: qty 1

## 2015-05-13 MED ORDER — BUPIVACAINE-EPINEPHRINE 0.25% -1:200000 IJ SOLN
INTRAMUSCULAR | Status: DC | PRN
  Administered 2015-05-13: 30 mL

## 2015-05-13 MED ORDER — BUPIVACAINE HCL (PF) 0.75 % IJ SOLN
INTRAMUSCULAR | Status: DC | PRN
  Administered 2015-05-13: 15 mg via INTRATHECAL

## 2015-05-13 MED ORDER — EPHEDRINE SULFATE 50 MG/ML IJ SOLN
INTRAMUSCULAR | Status: DC | PRN
  Administered 2015-05-13 (×3): 10 mg via INTRAVENOUS

## 2015-05-13 MED ORDER — ALUM & MAG HYDROXIDE-SIMETH 200-200-20 MG/5ML PO SUSP
30.0000 mL | ORAL | Status: DC | PRN
Start: 2015-05-13 — End: 2015-05-15
  Administered 2015-05-15: 30 mL via ORAL
  Filled 2015-05-13: qty 30

## 2015-05-13 MED ORDER — SODIUM CHLORIDE 0.9 % IJ SOLN
INTRAMUSCULAR | Status: DC | PRN
  Administered 2015-05-13: 50 mL

## 2015-05-13 MED ORDER — DEXAMETHASONE SODIUM PHOSPHATE 10 MG/ML IJ SOLN
INTRAMUSCULAR | Status: DC | PRN
  Administered 2015-05-13: 10 mg via INTRAVENOUS

## 2015-05-13 MED ORDER — ACETAMINOPHEN 650 MG RE SUPP: 650.0000 mg | Freq: Four times a day (QID) | RECTAL | Status: DC | PRN

## 2015-05-13 MED ORDER — MEPERIDINE HCL 50 MG/ML IJ SOLN: 6.2500 mg | INTRAMUSCULAR | Status: DC | PRN

## 2015-05-13 MED ORDER — KETOROLAC TROMETHAMINE 30 MG/ML IJ SOLN
INTRAMUSCULAR | Status: DC | PRN
  Administered 2015-05-13: 30 mg via INTRAVENOUS

## 2015-05-13 SURGICAL SUPPLY — 70 items
BAG DECANTER FOR FLEXI CONT (MISCELLANEOUS) ×3 IMPLANT
BAG SPEC THK2 15X12 ZIP CLS (MISCELLANEOUS)
BAG ZIPLOCK 12X15 (MISCELLANEOUS) IMPLANT
BANDAGE ELASTIC 4 VELCRO ST LF (GAUZE/BANDAGES/DRESSINGS) ×2 IMPLANT
BANDAGE ELASTIC 6 VELCRO ST LF (GAUZE/BANDAGES/DRESSINGS) ×3 IMPLANT
BANDAGE ESMARK 6X9 LF (GAUZE/BANDAGES/DRESSINGS) ×1 IMPLANT
BLADE SAW SGTL 13.0X1.19X90.0M (BLADE) ×3 IMPLANT
BLADE SAW SGTL 81X20 HD (BLADE) ×3 IMPLANT
BNDG CMPR 9X6 STRL LF SNTH (GAUZE/BANDAGES/DRESSINGS) ×1
BNDG ESMARK 6X9 LF (GAUZE/BANDAGES/DRESSINGS) ×3
BONE CEMENT SIMPLEX TOBRAMYCIN (Cement) ×3 IMPLANT
BRUSH FEMORAL CANAL (MISCELLANEOUS) IMPLANT
CEMENT BONE SIMPLEX TOBRAMYCIN (Cement) ×3 IMPLANT
CHLORAPREP W/TINT 26ML (MISCELLANEOUS) ×5 IMPLANT
CUFF TOURN SGL QUICK 34 (TOURNIQUET CUFF) ×3
CUFF TRNQT CYL 34X4X40X1 (TOURNIQUET CUFF) ×1 IMPLANT
DRAPE EXTREMITY T 121X128X90 (DRAPE) ×3 IMPLANT
DRAPE LG THREE QUARTER DISP (DRAPES) ×5 IMPLANT
DRAPE POUCH INSTRU U-SHP 10X18 (DRAPES) ×3 IMPLANT
DRAPE U-SHAPE 47X51 STRL (DRAPES) ×3 IMPLANT
DRSG AQUACEL AG ADV 3.5X10 (GAUZE/BANDAGES/DRESSINGS) ×3 IMPLANT
DRSG TEGADERM 4X4.75 (GAUZE/BANDAGES/DRESSINGS) IMPLANT
ELECT PENCIL ROCKER SW 15FT (MISCELLANEOUS) ×3 IMPLANT
ELECT REM PT RETURN 15FT ADLT (MISCELLANEOUS) ×3 IMPLANT
EVACUATOR 1/8 PVC DRAIN (DRAIN) ×3 IMPLANT
FACESHIELD WRAPAROUND (MASK) ×9 IMPLANT
FACESHIELD WRAPAROUND OR TEAM (MASK) ×3 IMPLANT
GAUZE SPONGE 4X4 12PLY STRL (GAUZE/BANDAGES/DRESSINGS) IMPLANT
GLOVE BIO SURGEON STRL SZ8.5 (GLOVE) ×11 IMPLANT
GLOVE BIOGEL PI IND STRL 8.5 (GLOVE) ×1 IMPLANT
GLOVE BIOGEL PI INDICATOR 8.5 (GLOVE) ×2
GLOVE ECLIPSE 8.0 STRL XLNG CF (GLOVE) ×3 IMPLANT
GOWN SPEC L3 XXLG W/TWL (GOWN DISPOSABLE) ×3 IMPLANT
HANDPIECE INTERPULSE COAX TIP (DISPOSABLE) ×3
HOOD PEEL AWAY FACE SHEILD DIS (HOOD) ×6 IMPLANT
INSERT TIBIAL X3 (Orthopedic Implant) ×2 IMPLANT
KIT BASIN OR (CUSTOM PROCEDURE TRAY) ×3 IMPLANT
LIQUID BAND (GAUZE/BANDAGES/DRESSINGS) ×4 IMPLANT
MANIFOLD NEPTUNE II (INSTRUMENTS) ×3 IMPLANT
NDL SAFETY ECLIPSE 18X1.5 (NEEDLE) ×2 IMPLANT
NDL SPNL 18GX3.5 QUINCKE PK (NEEDLE) ×1 IMPLANT
NEEDLE HYPO 18GX1.5 SHARP (NEEDLE) ×6
NEEDLE SPNL 18GX3.5 QUINCKE PK (NEEDLE) ×3 IMPLANT
NS IRRIG 1000ML POUR BTL (IV SOLUTION) ×3 IMPLANT
PACK TOTAL JOINT (CUSTOM PROCEDURE TRAY) ×3 IMPLANT
PADDING CAST COTTON 6X4 STRL (CAST SUPPLIES) ×2 IMPLANT
PATELLA A 35MMX10MM (Knees) ×2 IMPLANT
PEN SKIN MARKING BROAD (MISCELLANEOUS) ×3 IMPLANT
POSITIONER SURGICAL ARM (MISCELLANEOUS) ×3 IMPLANT
SEALER BIPOLAR AQUA 6.0 (INSTRUMENTS) ×5 IMPLANT
SET HNDPC FAN SPRY TIP SCT (DISPOSABLE) ×1 IMPLANT
SET PAD KNEE POSITIONER (MISCELLANEOUS) ×3 IMPLANT
SOL PREP POV-IOD 4OZ 10% (MISCELLANEOUS) ×3 IMPLANT
SPONGE DRAIN TRACH 4X4 STRL 2S (GAUZE/BANDAGES/DRESSINGS) ×3 IMPLANT
SPONGE LAP 18X18 X RAY DECT (DISPOSABLE) IMPLANT
STAPLER VISISTAT 35W (STAPLE) IMPLANT
SUCTION FRAZIER 12FR DISP (SUCTIONS) ×3 IMPLANT
SUT MNCRL AB 3-0 PS2 18 (SUTURE) ×3 IMPLANT
SUT MNCRL AB 4-0 PS2 18 (SUTURE) ×2 IMPLANT
SUT MON AB 2-0 CT1 36 (SUTURE) ×5 IMPLANT
SUT VIC AB 1 CT1 36 (SUTURE) ×5 IMPLANT
SUT VLOC 180 0 24IN GS25 (SUTURE) ×3 IMPLANT
SYR 50ML LL SCALE MARK (SYRINGE) ×6 IMPLANT
SYRINGE 10CC LL (SYRINGE) ×3 IMPLANT
TOWEL OR 17X26 10 PK STRL BLUE (TOWEL DISPOSABLE) ×5 IMPLANT
TOWER CARTRIDGE SMART MIX (DISPOSABLE) ×3 IMPLANT
TRAY FOLEY W/METER SILVER 14FR (SET/KITS/TRAYS/PACK) ×3 IMPLANT
WATER STERILE IRR 1500ML POUR (IV SOLUTION) ×3 IMPLANT
WRAP KNEE MAXI GEL POST OP (GAUZE/BANDAGES/DRESSINGS) ×3 IMPLANT
YANKAUER SUCT BULB TIP 10FT TU (MISCELLANEOUS) ×3 IMPLANT

## 2015-05-13 NOTE — Anesthesia Postprocedure Evaluation (Signed)
Anesthesia Post Note  Patient: Renee Bridges  Procedure(s) Performed: Procedure(s) (LRB): REVISION LEFT  KNEE ARTHROPLASTY, REVISION PATELLA POLY EXCHANGE (Left)  Anesthesia type: spinal  Patient location: PACU  Post pain: Pain level controlled  Post assessment: Patient's Cardiovascular Status Stable  Last Vitals:  Filed Vitals:   05/13/15 1807  BP: 119/64  Pulse: 99  Temp: 36.7 C  Resp: 12    Post vital signs: Reviewed and stable  Level of consciousness: awake  Complications: No apparent anesthesia complications

## 2015-05-13 NOTE — H&P (View-Only) (Signed)
TOTAL KNEE REVISION ADMISSION H&P  Patient is being admitted for left revision total knee arthroplasty.  Subjective:  Chief Complaint:left knee pain.  HPI: Renee Bridges, 69 y.o. female, has a history of pain and functional disability in the left knee(s) due to failed previous arthroplasty and patient has failed non-surgical conservative treatments for greater than 12 weeks to include flexibility and strengthening excercises, use of assistive devices and activity modification. The indications for the revision of the total knee arthroplasty are bearing surface wear leading to symptomatic synovitis, implant or knee misalignment and tibiofemoral instability and patellar component malalignment. Onset of symptoms was gradual starting 1 years ago with gradually worsening course since that time.  Prior procedures on the left knee(s) include arthroplasty.  Patient currently rates pain in the left knee(s) at 10 out of 10 with activity. There is worsening of pain with activity and weight bearing and crepitus.  Patient has evidence of eccentric poly wear and patellar malposition by imaging studies. This condition presents safety issues increasing the risk of falls.   There is no current active infection.  Patient Active Problem List   Diagnosis Date Noted  . Depression 03/25/2015  . Anxiety 03/25/2015  . Frank hematuria 06/30/2013  . Urge incontinence 06/30/2013   Past Medical History  Diagnosis Date  . Depression   . Anxiety   . Asthma   . Shortness of breath dyspnea     with excertion  . Chronic kidney disease     cyst on kidney  . History of hiatal hernia   . Seizures     hx. as child  . Headache   . Arthritis     Past Surgical History  Procedure Laterality Date  . Abdominal hysterectomy    . Back surgery    . Leg surgery Left   . Mass removed from back - 20 years ago    . Colonoscopy a year ago       (Not in a hospital admission) Allergies  Allergen Reactions  . Codeine Rash     Social History  Substance Use Topics  . Smoking status: Never Smoker   . Smokeless tobacco: Never Used  . Alcohol Use: No    Family History  Problem Relation Age of Onset  . Cancer Mother   . Cancer Father   . Cancer Brother   . Cancer Brother       Review of Systems  Constitutional: Positive for chills. Negative for fever, weight loss, malaise/fatigue and diaphoresis.  HENT: Positive for hearing loss. Negative for congestion, ear discharge, ear pain, nosebleeds, sore throat and tinnitus.   Eyes: Negative.   Respiratory: Positive for shortness of breath. Negative for cough, hemoptysis, sputum production, wheezing and stridor.   Cardiovascular: Negative.   Gastrointestinal: Positive for heartburn. Negative for nausea, abdominal pain, constipation, blood in stool and melena.  Genitourinary: Negative.   Musculoskeletal: Positive for back pain and joint pain. Negative for myalgias, falls and neck pain.  Skin: Negative.   Neurological: Negative.  Negative for weakness and headaches.  Endo/Heme/Allergies: Negative.   Psychiatric/Behavioral: Negative.      Objective:  Physical Exam  Vitals reviewed. Constitutional: She is oriented to person, place, and time. She appears well-developed and well-nourished.  HENT:  Head: Normocephalic and atraumatic.  Eyes: Conjunctivae and EOM are normal. Pupils are equal, round, and reactive to light.  Neck: Normal range of motion. Neck supple.  Cardiovascular: Normal rate, normal heart sounds and intact distal pulses.   Respiratory: Effort   normal and breath sounds normal.  GI: Soft. Bowel sounds are normal. She exhibits no distension. There is no tenderness. There is no rebound.  Genitourinary:  deferred  Musculoskeletal:       Left knee: She exhibits decreased range of motion, abnormal patellar mobility and MCL laxity.  Neurological: She is alert and oriented to person, place, and time. She has normal reflexes.  Skin: Skin is warm and dry.   Psychiatric: She has a normal mood and affect. Her behavior is normal. Judgment and thought content normal.    Vital signs in last 24 hours: @VSRANGES@  Labs:  Estimated body mass index is 33.67 kg/(m^2) as calculated from the following:   Height as of 05/06/15: 5' 3" (1.6 m).   Weight as of 05/06/15: 86.183 kg (190 lb).  Imaging Review Plain radiographs demonstrate cemented left TKA in ood alignment without loosening. Patellar malposition and maltracking. Eccentric poly wear. The bone quality appears to be adequate for age and reported activity level.   Assessment/Plan:  End stage arthritis, left knee(s) with failed previous arthroplasty.   The patient history, physical examination, clinical judgment of the provider and imaging studies are consistent with end stage degenerative joint disease of the left knee(s), previous total knee arthroplasty. Revision total knee arthroplasty is deemed medically necessary. The treatment options including medical management, injection therapy, arthroscopy and revision arthroplasty were discussed at length. The risks and benefits of revision total knee arthroplasty were presented and reviewed. The risks due to aseptic loosening, infection, stiffness, patella tracking problems, thromboembolic complications and other imponderables were discussed. The patient acknowledged the explanation, agreed to proceed with the plan and consent was signed. Patient is being admitted for inpatient treatment for surgery, pain control, PT, OT, prophylactic antibiotics, VTE prophylaxis, progressive ambulation and ADL's and discharge planning.The patient is planning to be discharged home with home health services   

## 2015-05-13 NOTE — Interval H&P Note (Signed)
History and Physical Interval Note:  05/13/2015 1:19 PM  Renee Bridges  has presented today for surgery, with the diagnosis of FAILED LEFT TOTAL KNEE ARTHROPLASTY  The various methods of treatment have been discussed with the patient and family. After consideration of risks, benefits and other options for treatment, the patient has consented to  Procedure(s): REVISION LEFT TOTAL KNEE ARTHROPLASTY (Left) as a surgical intervention .  Plan for poly exchange and revision patellar component, possible complete revision. The patient's history has been reviewed, patient examined, no change in status, stable for surgery.  I have reviewed the patient's chart and labs.  Questions were answered to the patient's satisfaction.     Eyob Godlewski, Cloyde Reams

## 2015-05-13 NOTE — Brief Op Note (Signed)
05/13/2015  4:26 PM  PATIENT:  Renee Bridges  69 y.o. female  PRE-OPERATIVE DIAGNOSIS:  FAILED LEFT TOTAL KNEE ARTHROPLASTY  POST-OPERATIVE DIAGNOSIS:  same  PROCEDURE:  Procedure(s): REVISION LEFT  KNEE ARTHROPLASTY, REVISION PATELLA POLY EXCHANGE (Left)  SURGEON:  Surgeon(s) and Role:    * Samson Frederic, MD - Primary  PHYSICIAN ASSISTANT:   ASSISTANTS: Karlton Lemon, [facility]   ANESTHESIA:   local and spinal  EBL:  Total I/O In: 100 [I.V.:100] Out: 300 [Urine:150; Blood:150]  BLOOD ADMINISTERED:none  DRAINS: (1 medium) Hemovact drain(s) in the left knee with  Suction Open   LOCAL MEDICATIONS USED:  MARCAINE     SPECIMEN:  Source of Specimen:  left knee fluid for culture and Aspirate  DISPOSITION OF SPECIMEN:  micro  COUNTS:  YES  TOURNIQUET:   Total Tourniquet Time Documented: Thigh (Left) - 8 minutes Total: Thigh (Left) - 8 minutes   DICTATION: .Other Dictation: Dictation Number [id number]  PLAN OF CARE: Admit to inpatient   PATIENT DISPOSITION:  PACU - hemodynamically stable.   Delay start of Pharmacological VTE agent (>24hrs) due to surgical blood loss or risk of bleeding: no

## 2015-05-13 NOTE — Anesthesia Preprocedure Evaluation (Signed)
Anesthesia Evaluation  Patient identified by MRN, date of birth, ID band Patient awake    Reviewed: Allergy & Precautions, NPO status , Patient's Chart, lab work & pertinent test results  Airway Mallampati: I  TM Distance: >3 FB Neck ROM: Full    Dental   Pulmonary asthma ,    Pulmonary exam normal        Cardiovascular Normal cardiovascular exam     Neuro/Psych Anxiety Depression    GI/Hepatic hiatal hernia,   Endo/Other    Renal/GU Renal InsufficiencyRenal disease     Musculoskeletal   Abdominal   Peds  Hematology   Anesthesia Other Findings   Reproductive/Obstetrics                             Anesthesia Physical Anesthesia Plan  ASA: III  Anesthesia Plan: Spinal   Post-op Pain Management:    Induction: Intravenous  Airway Management Planned: Simple Face Mask  Additional Equipment:   Intra-op Plan:   Post-operative Plan:   Informed Consent: I have reviewed the patients History and Physical, chart, labs and discussed the procedure including the risks, benefits and alternatives for the proposed anesthesia with the patient or authorized representative who has indicated his/her understanding and acceptance.     Plan Discussed with: CRNA and Surgeon  Anesthesia Plan Comments:         Anesthesia Quick Evaluation

## 2015-05-13 NOTE — Transfer of Care (Signed)
Immediate Anesthesia Transfer of Care Note  Patient: Renee Bridges  Procedure(s) Performed: Procedure(s): REVISION LEFT  KNEE ARTHROPLASTY, REVISION PATELLA POLY EXCHANGE (Left)  Patient Location: PACU  Anesthesia Type:Spinal  Level of Consciousness: sedated  Airway & Oxygen Therapy: Patient Spontanous Breathing and Patient connected to face mask oxygen  Post-op Assessment: Report given to RN and Post -op Vital signs reviewed and stable  Post vital signs: Reviewed and stable  Last Vitals:  Filed Vitals:   05/13/15 1049  BP: 139/88  Pulse: 84  Temp: 36.3 C  Resp: 18    Complications: No apparent anesthesia complications

## 2015-05-13 NOTE — Anesthesia Procedure Notes (Signed)
Spinal Patient location during procedure: OR Start time: 05/13/2015 2:10 PM End time: 05/13/2015 2:20 PM Staffing Anesthesiologist: Arta Bruce Performed by: anesthesiologist  Preanesthetic Checklist Completed: patient identified, site marked, surgical consent, pre-op evaluation, timeout performed, IV checked, risks and benefits discussed and monitors and equipment checked Spinal Block Patient position: sitting Prep: Betadine Patient monitoring: heart rate, cardiac monitor, continuous pulse ox and blood pressure Approach: right paramedian Location: L2-3 Injection technique: single-shot Needle Needle type: Pencan  Needle gauge: 24 G Needle length: 9 cm Needle insertion depth: 6 cm Assessment Sensory level: T8

## 2015-05-14 ENCOUNTER — Encounter (HOSPITAL_COMMUNITY): Payer: Self-pay | Admitting: Orthopedic Surgery

## 2015-05-14 LAB — CBC
HCT: 41.8 % (ref 36.0–46.0)
Hemoglobin: 13.9 g/dL (ref 12.0–15.0)
MCH: 33.2 pg (ref 26.0–34.0)
MCHC: 33.3 g/dL (ref 30.0–36.0)
MCV: 99.8 fL (ref 78.0–100.0)
PLATELETS: 221 10*3/uL (ref 150–400)
RBC: 4.19 MIL/uL (ref 3.87–5.11)
RDW: 12.8 % (ref 11.5–15.5)
WBC: 16.1 10*3/uL — AB (ref 4.0–10.5)

## 2015-05-14 LAB — BASIC METABOLIC PANEL
ANION GAP: 6 (ref 5–15)
BUN: 12 mg/dL (ref 6–20)
CO2: 26 mmol/L (ref 22–32)
Calcium: 8.9 mg/dL (ref 8.9–10.3)
Chloride: 107 mmol/L (ref 101–111)
Creatinine, Ser: 1.03 mg/dL — ABNORMAL HIGH (ref 0.44–1.00)
GFR calc Af Amer: >60 mL/min (ref >60)
GFR, EST NON AFRICAN AMERICAN: 53 mL/min — AB (ref >60)
Glucose, Bld: 159 mg/dL — ABNORMAL HIGH (ref 65–99)
POTASSIUM: 4.3 mmol/L (ref 3.5–5.1)
SODIUM: 139 mmol/L (ref 135–145)

## 2015-05-14 MED ORDER — APIXABAN 2.5 MG PO TABS: 2.5000 mg | ORAL_TABLET | Freq: Two times a day (BID) | ORAL | Status: DC

## 2015-05-14 MED ORDER — SENNA 8.6 MG PO TABS: 2.0000 | ORAL_TABLET | Freq: Every day | ORAL | Status: DC

## 2015-05-14 MED ORDER — HYDROCODONE-ACETAMINOPHEN 5-325 MG PO TABS: 1.0000 | ORAL_TABLET | ORAL | Status: DC | PRN

## 2015-05-14 MED ORDER — DOCUSATE SODIUM 100 MG PO CAPS: 100.0000 mg | ORAL_CAPSULE | Freq: Two times a day (BID) | ORAL | Status: DC

## 2015-05-14 MED ORDER — ONDANSETRON HCL 4 MG PO TABS: 4.0000 mg | ORAL_TABLET | Freq: Four times a day (QID) | ORAL | Status: DC | PRN

## 2015-05-14 NOTE — Evaluation (Signed)
Occupational Therapy Evaluation and Discharge Patient Details Name: Renee Bridges MRN: [id number] DOB: Jul 12, 1946 Today's Date: 05/14/2015    History of Present Illness s/p L TKA revision   Clinical Impression   This 69 yo female admitted with above presents to acute OT with all education completed with pt and her daughter whom pt will be staying with. No further OT needs, we will sign off.    Follow Up Recommendations  No OT follow up    Equipment Recommendations  None recommended by OT       Precautions / Restrictions Precautions Precautions: Knee Restrictions Weight Bearing Restrictions: No      Mobility Bed Mobility               General bed mobility comments: Pt up in recliner  Transfers Overall transfer level: Needs assistance Equipment used: Rolling walker (2 wheeled) Transfers: Sit to/from Stand Sit to Stand: Supervision                   ADL Overall ADL's : Needs assistance/impaired                                       General ADL Comments: S overall for BADLs except min A to donn left sock due to bandaging on leg. Will be min A for tub transfer intitally due to tub seat she has and tub/shower combo has door on it     Vision Additional Comments: No change from baseline          Pertinent Vitals/Pain Pain Assessment: No/denies pain Pain Score: 1  Pain Location: L knee Pain Descriptors / Indicators: Sore Pain Intervention(s): Limited activity within patient's tolerance;Monitored during session;Repositioned;Premedicated before session;Ice applied     Hand Dominance Right   Extremity/Trunk Assessment Upper Extremity Assessment Upper Extremity Assessment: Overall WFL for tasks assessed       Communication Communication Communication: No difficulties   Cognition Arousal/Alertness: Awake/alert Behavior During Therapy: WFL for tasks assessed/performed Overall Cognitive Status: Within Functional Limits for tasks  assessed                                Home Living Family/patient expects to be discharged to:: Private residence Living Arrangements: Children (going to dtr's house) Available Help at Discharge: Available PRN/intermittently Type of Home: House Home Access: Stairs to enter [employer] of Steps: 1 Entrance Stairs-Rails: None Home Layout: One level     Bathroom Shower/Tub: Tub/shower unit;Door Shower/tub characteristics: Sport and exercise psychologist: Standard     Home Equipment: Shower seat (versa frame)   Additional Comments: pt planning to go to her dtr's house for 2 wks--1 step at front entry;       Prior Functioning/Environment Level of Independence: Independent             OT Diagnosis: Generalized weakness         OT Goals(Current goals can be found in the care plan section) Acute Rehab OT Goals Patient Stated Goal: home tomorrow  OT Frequency:                End of Session Equipment Utilized During Treatment: Rolling walker  Activity Tolerance: Patient tolerated treatment well Patient left: in chair;with call bell/phone within reach;with family/visitor present (educated to call if needs/wants to get up for any reason)   Time: 1004-1040 OT  Time Calculation (min): 36 min Charges:  OT General Charges $OT Visit: 1 Procedure OT Evaluation $Initial OT Evaluation Tier I: 1 Procedure OT Treatments $Self Care/Home Management : 8-22 mins  Evette Georges [phone] 05/14/2015, 10:51 AM

## 2015-05-14 NOTE — Progress Notes (Signed)
   Subjective:  Patient reports pain as mild to moderate.  No c/o. Denies N/V/CP/SOB.  Objective:   VITALS:   Filed Vitals:   05/13/15 2012 05/13/15 2100 05/14/15 0119 05/14/15 0531  BP: 122/63 108/60 103/66 124/76  Pulse: 109 106 96 80  Temp: 98 F (36.7 C) 97.9 F (36.6 C) 97.7 F (36.5 C) 97.9 F (36.6 C)  TempSrc: Oral Oral Oral Oral  Resp: Height:      Weight:      SpO2: 95% 96% 95% 95%    ABD soft Sensation intact distally Intact pulses distally Dorsiflexion/Plantar flexion intact Incision: dressing C/D/I Compartment soft HV ss  Lab Results  Component Value Date   WBC 16.1* 05/14/2015   HGB 13.9 05/14/2015   HCT 41.8 05/14/2015   MCV 99.8 05/14/2015   PLT 221 05/14/2015   BMET    Component Value Date/Time   NA 139 05/14/2015 0428   NA 139 03/26/2015 1205   K 4.3 05/14/2015 0428   CL 107 05/14/2015 0428   CO2 26 05/14/2015 0428   GLUCOSE 159* 05/14/2015 0428   GLUCOSE 91 03/26/2015 1205   BUN 12 05/14/2015 0428   BUN 12 03/26/2015 1205   CREATININE 1.03* 05/14/2015 0428   CALCIUM 8.9 05/14/2015 0428   GFRNONAA 53* 05/14/2015 0428   GFRAA >60 05/14/2015 0428    Intraop cell count benign intraop culture 9/8 NGTD  Assessment/Plan: 1 Day Post-Op   Principal Problem:   Failed total left knee replacement   WBAT with walker DVT ppx: apixaban 2.5mg  PO BID, TEDs, foot pumps PO pain control D/c HV drain Follow intraop culture while in house Advance diet Up with therapy D/C IV fluids Plan for discharge tomorrow to daughter's home    Renee Bridges, Cloyde Reams 05/14/2015, 7:17 AM   Samson Frederic, MD Cell [phone]

## 2015-05-14 NOTE — Op Note (Signed)
NAMELORIANN, BOSSERMAN                ACCOUNT NO.:  [id number]  MEDICAL RECORD NO.:  [id number]  LOCATION:  1525                         FACILITY:  [facility]  PHYSICIAN:  Samson Frederic, MD     DATE OF BIRTH:  [birth year]  DATE OF PROCEDURE:  05/13/2015 DATE OF DISCHARGE:                              OPERATIVE REPORT   PREOPERATIVE DIAGNOSIS:  Failed left total knee arthroplasty.  POSTOPERATIVE DIAGNOSIS:  Failed left total knee arthroplasty.  PROCEDURE PERFORMED:  Revision left total knee arthroplasty with polyethylene liner exchange and patellar component revision.  SURGEON:  Samson Frederic, MD  ASSISTANT:  Karlton Lemon, PA-C  ANESTHESIA:  Spinal.  EBL:  150 mL.  TOURNIQUET TIME:  Eight minutes.  SPECIMEN: 1. Left knee synovial fluid for cell count. 2. Left knee fluid for culture.  COMPLICATIONS:  None.  DISPOSITION:  Stable to PACU.  ANTIBIOTICS:  2 g of Ancef.  EXPLANTS: 1. 26 mm all polyethylene patellar component. 2. Size 5, 12 mm PS insert.  IMPLANTS: 1. Scorpio-Flex X3 PS insert size 5 x 15 mm. 2. Triathlon all poly patella, size 35. 3. Simplex P antibiotic bone cement.  INDICATIONS:  The patient is a 69 year old female, who underwent primary left total knee arthroplasty on July 26, 2005 by Dr. Simonne Come. Patient did well for number of years.  She developed increasing patellar pain and flexion instability.  I worked her up for infection, which was negative.  We discussed the risks, benefits, and alternatives to polyethylene liner exchange and revision of her patellar component.  She elected to proceed.  DESCRIPTION OF PROCEDURE IN DETAIL:  Patient was correctly identified in the holding area using 2 identifiers.  Surgical site was marked by myself.  She was taken to the operating room.  Spinal anesthesia was placed.  She was then placed supine on the operating room table.  Foley catheter was inserted.  Nonsterile tourniquet was applied to the  left groin.  A bump was placed under the left hip.  All bony prominences were well padded.  Left lower extremity was prepped and draped in normal sterile surgical fashion.  Time-out was called verifying site and site of surgery.  She did receive 2 g of Ancef within 60 minutes of beginning the procedure.  I began by utilizing her previous incision, which I excised.  I created full-thickness flaps and exposed the extensor mechanism.  Through the extensor mechanism, I inserted an 18-gauge needle and I aspirated about 3 mL of clear straw-colored joint fluid which I sent to the lab for cell count with differential.  I then made a standard medial parapatellar arthrotomy.  There was no significant synovitis.  No purulence.  No evidence of infection.  I began by doing a complete synovectomy of 1st the medial gutter, then the lateral gutter. I excised the infrapatellar scar.  I placed the knee in extension.  I turned my attention to the patella.  She did have an obliquely positioned patellar component, I removed it with an ACL saw blade.  I then took a saw and I re-cut the patella freehand.  I sized a 35.  I drilled the lug holes and I placed the trial  patellar component, I took the knee through a range of motion and she had excellent stability.  I then removed the patellar trial and placed protector.  I turned my attention to the polyethylene liner, which I removed with 0.25 inch osteotome.  She did have flexion instability and mid flexion instability as well.  The polyethylene liner was noted to have some medial wear.  I then placed the knee in full extension. Under direct visualization,  I released the posterior capsule to increase our extension gap.  I then placed a 15 mm trial.  The knee had excellent varus valgus balance and the flexion and extension gaps were now very well balanced.  I removed the polyethylene trial and then I copiously irrigated the knee with saline.  The real polyethylene  liner was placed and then I brought the knee into full extension.  The tourniquet was inflated.  I irrigated the bony surface of the patella and then I cemented in the new patellar component.  Excess cement was cleared.  Once the cement was fully hardened, then the tourniquet was let down and I tested the knee for a final time confirming excellent patellar tracking using the no thumbs technique and as well she had perfectly balanced flexion and extension gaps.  The wound was then irrigated with a dilute Betadine solution followed by saline with pulse lavage.  I closed the arthrotomy over a medium Hemovac drain using #1 Vicryl and 0 V-Loc suture.  Deep fatty layer was closed with 2-0 Vicryl suture.  Deep dermal layer was closed with 2-0 Monocryl suture interrupted.  Running 3-0 Monocryl subcuticular stitch was done and then the wound was glued.  Once the glue was fully hardened, Aquacel dressing followed by a bulky wrap was applied. Sponge, needle, and instrument counts were correct at the end of the case x2.  There were no known complications.  The patient was aroused and taken to PACU in stable condition.  I discussed the operative events and findings with the patient's family. We will admit her to the hospital overnight.  We will put her on Eliquis for DVT prophylaxis.  She may weightbear as tolerated.  We will get her working with physical therapy.  Once she clears therapy, we will plan to discharge her to home and this will likely be after 2 nights in the hospital.          ______________________________ Samson Frederic, MD     BS/MEDQ  D:  05/13/2015  T:  05/14/2015  Job:  [id number]

## 2015-05-14 NOTE — Progress Notes (Signed)
Utilization review completed.  

## 2015-05-14 NOTE — Discharge Summary (Signed)
Physician Discharge Summary  Patient ID: Renee Bridges MRN: [id number] DOB/AGE: 1945/12/21 69 y.o.  Admit date: 05/13/2015 Discharge date: 05/15/2015  Admission Diagnoses:  Failed total left knee replacement  Discharge Diagnoses:  Principal Problem:   Failed total left knee replacement   Past Medical History  Diagnosis Date  . Depression   . Anxiety   . Asthma   . Shortness of breath dyspnea     with excertion  . Chronic kidney disease     cyst on kidney  . History of hiatal hernia   . Seizures     hx. as child  . Headache   . Arthritis     Surgeries: Procedure(s): REVISION LEFT  KNEE ARTHROPLASTY, REVISION PATELLA POLY EXCHANGE on 05/13/2015   Consultants (if any):    Discharged Condition: Improved  Hospital Course: Renee Bridges is an 69 y.o. female who was admitted 05/13/2015 with a diagnosis of Failed total left knee replacement and went to the operating room on 05/13/2015 and underwent the above named procedures.    She was given perioperative antibiotics:      Anti-infectives    Start     Dose/Rate Route Frequency Ordered Stop   05/13/15 2000  ceFAZolin (ANCEF) IVPB 2 g/50 mL premix     2 g 100 mL/hr over 30 Minutes Intravenous Every 6 hours 05/13/15 1817 05/14/15 0313   05/13/15 1046  ceFAZolin (ANCEF) IVPB 2 g/50 mL premix     2 g 100 mL/hr over 30 Minutes Intravenous On call to O.R. 05/13/15 1046 05/13/15 1407    .  She was given sequential compression devices, early ambulation, and apixaban for DVT prophylaxis.  She benefited maximally from the hospital stay and there were no complications.    Recent vital signs:  Filed Vitals:   05/15/15 0619  BP: 171/86  Pulse: 74  Temp: 99 F (37.2 C)  Resp: 20    Recent laboratory studies:  Lab Results  Component Value Date   HGB 12.4 05/15/2015   HGB 13.9 05/14/2015   HGB 15.6* 05/06/2015   Lab Results  Component Value Date   WBC 23.8* 05/15/2015   PLT 228 05/15/2015   Lab Results  Component  Value Date   INR 0.98 05/06/2015   Lab Results  Component Value Date   NA 139 05/14/2015   K 4.3 05/14/2015   CL 107 05/14/2015   CO2 26 05/14/2015   BUN 12 05/14/2015   CREATININE 1.03* 05/14/2015   GLUCOSE 159* 05/14/2015    Discharge Medications:     Medication List    STOP taking these medications        acetaminophen 500 MG tablet  Commonly known as:  TYLENOL      TAKE these medications        apixaban 2.5 MG Tabs tablet  Commonly known as:  ELIQUIS  Take 1 tablet (2.5 mg total) by mouth every 12 (twelve) hours.     citalopram 20 MG tablet  Commonly known as:  CELEXA  Take 1 tablet (20 mg total) by mouth daily.     diphenhydrAMINE 25 MG tablet  Commonly known as:  SOMINEX  Take 25 mg by mouth at bedtime as needed for allergies or sleep.     docusate sodium 100 MG capsule  Commonly known as:  COLACE  Take 1 capsule (100 mg total) by mouth 2 (two) times daily.     HYDROcodone-acetaminophen 5-325 MG per tablet  Commonly known as:  NORCO  Take 1-2 tablets by mouth every 4 (four) hours as needed for moderate pain.     ondansetron 4 MG tablet  Commonly known as:  ZOFRAN  Take 1 tablet (4 mg total) by mouth every 6 (six) hours as needed for nausea.     senna 8.6 MG Tabs tablet  Commonly known as:  SENOKOT  Take 2 tablets (17.2 mg total) by mouth at bedtime.        Diagnostic Studies: Dg Knee 1-2 Views Left  2015-05-22   CLINICAL DATA:  Revision of left total knee prosthesis with patella poly exchange.  EXAM: LEFT KNEE - 1-2 VIEW  COMPARISON:  Radiographs dated 07/26/2005  FINDINGS: Soft tissue drain in place. The components of the total knee prosthesis appear in good position. No fractures.  IMPRESSION: Satisfactory postoperative appearance of the left knee after revision of the patellar component of the total knee prosthesis.   Electronically Signed   By: Francene Boyers M.D.   On: 2015-05-22 17:59    Disposition:   Discharge Instructions    Call MD / Call  911    Complete by:  As directed   If you experience chest pain or shortness of breath, CALL 911 and be transported to the hospital emergency room.  If you develope a fever above 101 F, pus (white drainage) or increased drainage or redness at the wound, or calf pain, call your surgeon's office.     Constipation Prevention    Complete by:  As directed   Drink plenty of fluids.  Prune juice may be helpful.  You may use a stool softener, such as Colace (over the counter) 100 mg twice a day.  Use MiraLax (over the counter) for constipation as needed.     Diet - low sodium heart healthy    Complete by:  As directed      Do not put a pillow under the knee. Place it under the heel.    Complete by:  As directed      Driving restrictions    Complete by:  As directed   No driving for 6 weeks     Increase activity slowly as tolerated    Complete by:  As directed      Lifting restrictions    Complete by:  As directed   No lifting for 6 weeks     TED hose    Complete by:  As directed   Use stockings (thigh high TED hose) for 2 weeks on both leg(s).  You may remove them at night for sleeping.           Follow-up Information    Follow up with Breslyn Abdo, Cloyde Reams, MD. Schedule an appointment as soon as possible for a visit in 2 weeks.   Specialty:  Orthopedic Surgery   Why:  For wound re-check   Contact information:   [address] [place] [address] [phone]        Signed: Garnet Koyanagi 05/15/2015, 10:13 AM

## 2015-05-14 NOTE — Progress Notes (Signed)
   05/14/15 1400  PT Visit Information  Last PT Received On 05/14/15  Assistance Needed +1  History of Present Illness s/p L TKA revision  PT Time Calculation  PT Start Time (ACUTE ONLY) 1426  PT Stop Time (ACUTE ONLY) 1444  PT Time Calculation (min) (ACUTE ONLY) 18 min  Subjective Data  Patient Stated Goal home tomorrow  Precautions  Precautions Knee  Restrictions  Weight Bearing Restrictions No  Other Position/Activity Restrictions WBAT  Pain Assessment  Pain Assessment No/denies pain  Cognition  Arousal/Alertness Awake/alert  Behavior During Therapy WFL for tasks assessed/performed  Overall Cognitive Status Within Functional Limits for tasks assessed  Ambulation/Gait  General Gait Details pt in bed, tired from trips to bathroom, second amb  deferred   Total Joint Exercises  Ankle Circles/Pumps AROM;Both;10 reps  Quad Sets 10 reps;Both;AROM  Heel Slides AAROM;AROM;15 reps;Left  Short Arc Quad AROM;Left;10 reps  Hip ABduction/ADduction AROM;AAROM;Left;10 reps  Straight Leg Raises AROM;AAROM;Left;10 reps  Goniometric ROM grossly 10 to 75*  PT - End of Session  Equipment Utilized During Treatment Gait belt  Activity Tolerance Patient tolerated treatment well  Patient left in bed;with call bell/phone within reach;with family/visitor present  Nurse Communication Mobility status  PT - Assessment/Plan  PT Plan Current plan remains appropriate  PT Frequency (ACUTE ONLY) 7X/week  Follow Up Recommendations Home health PT  PT equipment Rolling walker with 5" wheels  PT Goal Progression  Progress towards PT goals Progressing toward goals  Acute Rehab PT Goals  PT Goal Formulation With patient  Time For Goal Achievement 05/18/15  Potential to Achieve Goals Good  PT General Charges  $$ ACUTE PT VISIT 1 Procedure  PT Treatments  $Therapeutic Exercise 8-22 mins

## 2015-05-14 NOTE — Discharge Instructions (Signed)
°Dr. Brian Swinteck °Total Joint Specialist °[facility] °[address] °[place], [place] [address] °[phone] ° °TOTAL KNEE REPLACEMENT POSTOPERATIVE DIRECTIONS ° ° ° °Knee Rehabilitation, Guidelines Following Surgery  °Results after knee surgery are often greatly improved when you follow the exercise, range of motion and muscle strengthening exercises prescribed by your doctor. Safety measures are also important to protect the knee from further injury. Any time any of these exercises cause you to have increased pain or swelling in your knee joint, decrease the amount until you are comfortable again and slowly increase them. If you have problems or questions, call your caregiver or physical therapist for advice.  ° °WEIGHT BEARING °Weight bearing as tolerated with assist device (walker, cane, etc) as directed, use it as long as suggested by your surgeon or therapist, typically at least 4-6 weeks. ° °HOME CARE INSTRUCTIONS  °Remove items at home which could result in a fall. This includes throw rugs or furniture in walking pathways.  °Continue medications as instructed at time of discharge. °You may have some home medications which will be placed on hold until you complete the course of blood thinner medication.  °You may start showering once you are discharged home but do not submerge the incision under water. Just pat the incision dry and apply a dry gauze dressing on daily. °Walk with walker as instructed.  °You may resume a sexual relationship in one month or when given the OK by your doctor.  °· Use walker as long as suggested by your caregivers. °· Avoid periods of inactivity such as sitting longer than an hour when not asleep. This helps prevent blood clots.  °You may put full weight on your legs and walk as much as is comfortable.  °You may return to work once you are cleared by your doctor.  °Do not drive a car for 6 weeks or until released by you surgeon.  °· Do not drive while  taking narcotics.  °Wear the elastic stockings for three weeks following surgery during the day but you may remove then at night. °Make sure you keep all of your appointments after your operation with all of your doctors and caregivers. You should call the office at the above phone number and make an appointment for approximately two weeks after the date of your surgery. °Do not remove your surgical dressing. The dressing is waterproof; you may take showers in 3 days, but do not take tub baths or submerge the dressing. °Please pick up a stool softener and laxative for home use as long as you are requiring pain medications. °· ICE to the affected knee every three hours for 30 minutes at a time and then as needed for pain and swelling.  Continue to use ice on the knee for pain and swelling from surgery. You may notice swelling that will progress down to the foot and ankle.  This is normal after surgery.  Elevate the leg when you are not up walking on it.   °It is important for you to complete the blood thinner medication as prescribed by your doctor. °· Continue to use the breathing machine which will help keep your temperature down.  It is common for your temperature to cycle up and down following surgery, especially at night when you are not up moving around and exerting yourself.  The breathing machine keeps your lungs expanded and your temperature down. ° °RANGE OF MOTION AND STRENGTHENING EXERCISES  °Rehabilitation of the knee is important following a   knee injury or an operation. After just a few days of immobilization, the muscles of the thigh which control the knee become weakened and shrink (atrophy). Knee exercises are designed to build up the tone and strength of the thigh muscles and to improve knee motion. Often times heat used for twenty to thirty minutes before working out will loosen up your tissues and help with improving the range of motion but do not use heat for the first two weeks following  surgery. These exercises can be done on a training (exercise) mat, on the floor, on a table or on a bed. Use what ever works the best and is most comfortable for you Knee exercises include:  Leg Lifts - While your knee is still immobilized in a splint or cast, you can do straight leg raises. Lift the leg to 60 degrees, hold for 3 sec, and slowly lower the leg. Repeat 10-20 times 2-3 times daily. Perform this exercise against resistance later as your knee gets better.  Quad and Hamstring Sets - Tighten up the muscle on the front of the thigh (Quad) and hold for 5-10 sec. Repeat this 10-20 times hourly. Hamstring sets are done by pushing the foot backward against an object and holding for 5-10 sec. Repeat as with quad sets.  A rehabilitation program following serious knee injuries can speed recovery and prevent re-injury in the future due to weakened muscles. Contact your doctor or a physical therapist for more information on knee rehabilitation.   SKILLED REHAB INSTRUCTIONS: If the patient is transferred to a skilled rehab facility following release from the hospital, a list of the current medications will be sent to the facility for the patient to continue.  When discharged from the skilled rehab facility, please have the facility set up the patient's Home Health Physical Therapy prior to being released. Also, the skilled facility will be responsible for providing the patient with their medications at time of release from the facility to include their pain medication, the muscle relaxants, and their blood thinner medication. If the patient is still at the rehab facility at time of the two week follow up appointment, the skilled rehab facility will also need to assist the patient in arranging follow up appointment in our office and any transportation needs.  MAKE SURE YOU:  Understand these instructions.  Will watch your condition.  Will get help right away if you are not doing well or get worse.     Pick up stool softner and laxative for home use following surgery while on pain medications. Do NOT remove your dressing. You may shower.  Do not take tub baths or submerge incision under water. May shower starting three days after surgery. Please use a clean towel to pat the incision dry following showers. Continue to use ice for pain and swelling after surgery. Do not use any lotions or creams on the incision until instructed by your surgeon.  Information on my medicine - ELIQUIS (apixaban)  This medication education was reviewed with me or my healthcare representative as part of my discharge preparation.  The pharmacist that spoke with me during my hospital stay was:  Elson Clan, [facility]  Why was Eliquis prescribed for you? Eliquis was prescribed for you to reduce the risk of blood clots forming after orthopedic surgery.    What do You need to know about Eliquis? Take your Eliquis TWICE DAILY - one tablet in the morning and one tablet in the evening with or without food.  It would be best to take the dose about the same time each day.  If you have difficulty swallowing the tablet whole please discuss with your pharmacist how to take the medication safely.  Take Eliquis exactly as prescribed by your doctor and DO NOT stop taking Eliquis without talking to the doctor who prescribed the medication.  Stopping without other medication to take the place of Eliquis may increase your risk of developing a clot.  After discharge, you should have regular check-up appointments with your healthcare provider that is prescribing your Eliquis.  What do you do if you miss a dose? If a dose of ELIQUIS is not taken at the scheduled time, take it as soon as possible on the same day and twice-daily administration should be resumed.  The dose should not be doubled to make up for a missed dose.  Do not take more than one tablet of ELIQUIS at the same time.  Important Safety  Information A possible side effect of Eliquis is bleeding. You should call your healthcare provider right away if you experience any of the following: ? Bleeding from an injury or your nose that does not stop. ? Unusual colored urine (red or dark brown) or unusual colored stools (red or black). ? Unusual bruising for unknown reasons. ? A serious fall or if you hit your head (even if there is no bleeding).  Some medicines may interact with Eliquis and might increase your risk of bleeding or clotting while on Eliquis. To help avoid this, consult your healthcare provider or pharmacist prior to using any new prescription or non-prescription medications, including herbals, vitamins, non-steroidal anti-inflammatory drugs (NSAIDs) and supplements.  This website has more information on Eliquis (apixaban): http://www.eliquis.com/eliquis/home

## 2015-05-14 NOTE — Evaluation (Signed)
Physical Therapy Evaluation Patient Details Name: Renee Bridges MRN: [id number] DOB: 03-10-1946 Today's Date: 05/14/2015   History of Present Illness  s/p L TKA revision  Clinical Impression  Pt admitted with above diagnosis. Pt currently with functional limitations due to the deficits listed below (see PT Problem List).  Pt will benefit from skilled PT to increase their independence and safety with mobility to allow discharge to the venue listed below.   Pt doing well today, pain controlled, pt very motivated; will follow    Follow Up Recommendations Home health PT    Equipment Recommendations  Rolling walker with 5" wheels    Recommendations for Other Services       Precautions / Restrictions Precautions Precautions: Knee Restrictions Weight Bearing Restrictions: No Other Position/Activity Restrictions: WBAT      Mobility  Bed Mobility               General bed mobility comments: Pt up in recliner  Transfers Overall transfer level: Needs assistance Equipment used: Rolling walker (2 wheeled) Transfers: Sit to/from Stand Sit to Stand: Min guard            Ambulation/Gait Ambulation/Gait assistance: Min guard Ambulation Distance (Feet): 140 Feet Assistive device: Rolling walker (2 wheeled) Gait Pattern/deviations: Step-through pattern     General Gait Details: cues for RW safety  Stairs            Wheelchair Mobility    Modified Rankin (Stroke Patients Only)       Balance                                             Pertinent Vitals/Pain Pain Assessment: No/denies pain Pain Score: 1  Pain Location: L knee Pain Descriptors / Indicators: Sore Pain Intervention(s): Limited activity within patient's tolerance;Monitored during session;Repositioned;Premedicated before session;Ice applied    Home Living Family/patient expects to be discharged to:: Private residence Living Arrangements: Children (going to dtr's  house) Available Help at Discharge: Available PRN/intermittently Type of Home: House Home Access: Stairs to enter Entrance Stairs-Rails: None Entrance Stairs-Number of Steps: 1 Home Layout: One level Home Equipment: Shower seat Additional Comments: pt planning to go to her dtr's house for 2 wks--1 step at front entry;     Prior Function Level of Independence: Independent               Hand Dominance   Dominant Hand: Right    Extremity/Trunk Assessment   Upper Extremity Assessment: Defer to OT evaluation;Overall WFL for tasks assessed           Lower Extremity Assessment: LLE deficits/detail   LLE Deficits / Details: hip flexion and knee extension 3/5; ankle WFL     Communication   Communication: No difficulties  Cognition Arousal/Alertness: Awake/alert Behavior During Therapy: WFL for tasks assessed/performed Overall Cognitive Status: Within Functional Limits for tasks assessed                      General Comments      Exercises Total Joint Exercises Ankle Circles/Pumps: AROM;Both;10 reps Quad Sets: 10 reps;Both;AROM Heel Slides: AAROM;Left;10 reps      Assessment/Plan    PT Assessment Patient needs continued PT services  PT Diagnosis Difficulty walking   PT Problem List Decreased strength;Decreased activity tolerance;Decreased mobility;Decreased range of motion  PT Treatment Interventions DME instruction;Gait training;Functional mobility training;Therapeutic activities;Patient/family  education;Therapeutic exercise   PT Goals (Current goals can be found in the Care Plan section) Acute Rehab PT Goals Patient Stated Goal: home tomorrow PT Goal Formulation: With patient Time For Goal Achievement: 05/18/15 Potential to Achieve Goals: Good    Frequency 7X/week   Barriers to discharge        Co-evaluation               End of Session Equipment Utilized During Treatment: Gait belt Activity Tolerance: Patient tolerated treatment  well Patient left: in chair;with call bell/phone within reach Nurse Communication: Mobility status         Time: 7829-5621 PT Time Calculation (min) (ACUTE ONLY): 30 min   Charges:   PT Evaluation $Initial PT Evaluation Tier I: 1 Procedure PT Treatments $Gait Training: 8-22 mins   PT G Codes:        Hadi Dubin 11-Jun-2015, 11:12 AM

## 2015-05-15 DIAGNOSIS — T84093A Other mechanical complication of internal left knee prosthesis, initial encounter: Secondary | ICD-10-CM | POA: Diagnosis not present

## 2015-05-15 LAB — CBC
HEMATOCRIT: 38.3 % (ref 36.0–46.0)
Hemoglobin: 12.4 g/dL (ref 12.0–15.0)
MCH: 32.8 pg (ref 26.0–34.0)
MCHC: 32.4 g/dL (ref 30.0–36.0)
MCV: 101.3 fL — AB (ref 78.0–100.0)
Platelets: 228 10*3/uL (ref 150–400)
RBC: 3.78 MIL/uL — ABNORMAL LOW (ref 3.87–5.11)
RDW: 13.3 % (ref 11.5–15.5)
WBC: 23.8 10*3/uL — ABNORMAL HIGH (ref 4.0–10.5)

## 2015-05-15 NOTE — Discharge Summary (Addendum)
Reviewed d/c instructions with pt and daughter including follow-up appointment, incision care, medications, and precautions.  Pt/daughter verbalized good understanding.  Pt being d/c to daughter's home into her care with [facility] PT follow-up.  Pt d/c with BSC and RW.

## 2015-05-15 NOTE — Care Management Note (Signed)
Case Management Note  Patient Details  Name: Renee Bridges MRN: [id number] Date of Birth: May 23, 1946  Subjective/Objective:      s/p L TKA revision              Action/Plan: Home Health  Expected Discharge Date:  05/15/15               Expected Discharge Plan:  Home w Home Health Services  In-House Referral:     Discharge planning Services  CM Consult  Post Acute Care Choice:    Choice offered to:     DME Arranged:  3-N-1, Walker rolling DME Agency:  [facility]  HH Arranged:  PT [facility] Agency:  [facility]  Status of Service:  Completed, signed off  Medicare Important Message Given:    Date Medicare IM Given:    Medicare IM give by:    Date Additional Medicare IM Given:    Additional Medicare Important Message give by:     If discussed at Long Length of Stay Meetings, dates discussed:    Additional Comments: NCM spoke to pt and DME was delivered to room. Offered choice for [facility] and agreeable to [place] for [facility]. [facility] aware of scheduled dc home today.  Elliot Cousin, RN 05/15/2015, 12:36 PM

## 2015-05-15 NOTE — Progress Notes (Signed)
   Subjective:  Patient reports pain as mild to moderate.  No c/o. Denies N/V/CP/SOB.  Objective:   VITALS:   Filed Vitals:   05/14/15 0900 05/14/15 1430 05/14/15 2232 05/15/15 0619  BP: 144/76 115/76 131/76 171/86  Pulse: 91 92 78 74  Temp: 97.6 F (36.4 C) 97.7 F (36.5 C) 98.2 F (36.8 C) 99 F (37.2 C)  TempSrc: Oral Oral Oral Oral  Resp: Height:      Weight:      SpO2: 97% 94% 95% 94%    ABD soft Sensation intact distally Intact pulses distally Dorsiflexion/Plantar flexion intact Incision: dressing C/D/I Compartment soft   Lab Results  Component Value Date   WBC 23.8* 05/15/2015   HGB 12.4 05/15/2015   HCT 38.3 05/15/2015   MCV 101.3* 05/15/2015   PLT 228 05/15/2015   BMET    Component Value Date/Time   NA 139 05/14/2015 0428   NA 139 03/26/2015 1205   K 4.3 05/14/2015 0428   CL 107 05/14/2015 0428   CO2 26 05/14/2015 0428   GLUCOSE 159* 05/14/2015 0428   GLUCOSE 91 03/26/2015 1205   BUN 12 05/14/2015 0428   BUN 12 03/26/2015 1205   CREATININE 1.03* 05/14/2015 0428   CALCIUM 8.9 05/14/2015 0428   GFRNONAA 53* 05/14/2015 0428   GFRAA >60 05/14/2015 0428    intraop culture 9/8 NGTD  Assessment/Plan: 2 Days Post-Op   Principal Problem:   Failed total left knee replacement   WBAT with walker DVT ppx: apixaban 2.5mg  PO BID, TEDs, foot pumps PO pain control Follow intraop culture while in house Up with therapy Discharge home with home health to daughter's home    Anabelle Bungert, Cloyde Reams 05/15/2015, 10:09 AM   Samson Frederic, MD Cell [phone]

## 2015-05-15 NOTE — Progress Notes (Signed)
   05/15/15 1200  PT Visit Information  Last PT Received On 05/15/15  Assistance Needed +1  History of Present Illness s/p L TKA revision  PT Time Calculation  PT Start Time (ACUTE ONLY) 1210  PT Stop Time (ACUTE ONLY) 1226  PT Time Calculation (min) (ACUTE ONLY) 16 min  Subjective Data  Patient Stated Goal home   Precautions  Precautions Knee;Fall  Restrictions  Other Position/Activity Restrictions WBAT  Pain Assessment  Pain Assessment Faces  Faces Pain Scale 2  Pain Location L knee  Pain Descriptors / Indicators Discomfort;Grimacing  Pain Intervention(s) Limited activity within patient's tolerance;Monitored during session;Premedicated before session;Ice applied  Cognition  Arousal/Alertness Awake/alert  Behavior During Therapy WFL for tasks assessed/performed  Overall Cognitive Status Within Functional Limits for tasks assessed  Bed Mobility  General bed mobility comments in chair  Transfers  Overall transfer level Needs assistance  Equipment used Rolling walker (2 wheeled)  Transfers Sit to/from Stand  Sit to Stand Min guard  General transfer comment cues for safety and hand placement  Ambulation/Gait  Ambulation/Gait assistance Min guard  Ambulation Distance (Feet) 15 Feet  Assistive device Rolling walker (2 wheeled)  Gait Pattern/deviations Step-to pattern;Antalgic;Decreased step length - right;Decreased step length - left  General Gait Details cues for RW safety and use of UEs for pain control during wt shift  Stairs Yes  Stairs assistance Min assist  Stair Management No rails;Step to pattern;Backwards;With walker  Number of Stairs 2  General stair comments cues for sequence  Total Joint Exercises  Ankle Circles/Pumps AROM;Both;10 reps  Quad Sets 10 reps;Both;AROM  Heel Slides AAROM;Left;10 reps  PT - End of Session  Equipment Utilized During Treatment Gait belt  Activity Tolerance Patient tolerated treatment well  Patient left in chair;with call bell/phone  within reach;with family/visitor present  Nurse Communication Mobility status (ready for D/C)  PT - Assessment/Plan  PT Plan Current plan remains appropriate  PT Frequency (ACUTE ONLY) 7X/week  Follow Up Recommendations Home health PT;Supervision for mobility/OOB  PT equipment Rolling walker with 5" wheels  PT Goal Progression  Progress towards PT goals Progressing toward goals  Acute Rehab PT Goals  PT Goal Formulation With patient  Time For Goal Achievement 05/18/15  Potential to Achieve Goals Good  PT General Charges  $$ ACUTE PT VISIT 1 Procedure  PT Treatments  $Gait Training 8-22 mins

## 2015-05-15 NOTE — Progress Notes (Signed)
Physical Therapy Treatment Patient Details Name: Renee Bridges MRN: [id number] DOB: 1945-09-26 Today's Date: 2015-06-12    History of Present Illness s/p L TKA revision    PT Comments    Will need to see again for stair training; limited by pain (has not had meds since last night)  Follow Up Recommendations  Home health PT;Supervision for mobility/OOB     Equipment Recommendations  Rolling walker with 5" wheels    Recommendations for Other Services       Precautions / Restrictions Precautions Precautions: Knee;Fall Restrictions Weight Bearing Restrictions: No Other Position/Activity Restrictions: WBAT    Mobility  Bed Mobility Overal bed mobility: Needs Assistance Bed Mobility: Supine to Sit     Supine to sit: Supervision     General bed mobility comments: for safety, incr time  Transfers Overall transfer level: Needs assistance Equipment used: Rolling walker (2 wheeled) Transfers: Sit to/from Stand Sit to Stand: Min guard         General transfer comment: cues for safety and hand placement  Ambulation/Gait Ambulation/Gait assistance: Min guard Ambulation Distance (Feet): 90 Feet Assistive device: Rolling walker (2 wheeled) Gait Pattern/deviations: Step-to pattern;Antalgic     General Gait Details: cues for RW safety and use of UEs for pain control during wt shift   Stairs            Wheelchair Mobility    Modified Rankin (Stroke Patients Only)       Balance                                    Cognition Arousal/Alertness: Awake/alert Behavior During Therapy: WFL for tasks assessed/performed Overall Cognitive Status: Within Functional Limits for tasks assessed                      Exercises Total Joint Exercises Ankle Circles/Pumps: AROM;Both;10 reps    General Comments        Pertinent Vitals/Pain Pain Assessment: Faces Faces Pain Scale: Hurts whole lot Pain Location: L knee Pain Descriptors /  Indicators: Constant;Burning Pain Intervention(s): Limited activity within patient's tolerance;Monitored during session;Patient requesting pain meds-RN notified;Ice applied    Home Living                      Prior Function            PT Goals (current goals can now be found in the care plan section) Acute Rehab PT Goals Patient Stated Goal: home  PT Goal Formulation: With patient Time For Goal Achievement: 05/18/15 Potential to Achieve Goals: Good Progress towards PT goals: Progressing toward goals    Frequency  7X/week    PT Plan Current plan remains appropriate    Co-evaluation             End of Session Equipment Utilized During Treatment: Gait belt Activity Tolerance: Patient limited by pain Patient left: in chair;with call bell/phone within reach     Time: 1019-1040 PT Time Calculation (min) (ACUTE ONLY): 21 min  Charges:  $Gait Training: 8-22 mins                    G Codes:      Renee Bridges 2015/06/12, 11:24 AM

## 2015-05-17 LAB — TISSUE CULTURE: CULTURE: NO GROWTH

## 2015-05-18 ENCOUNTER — Telehealth: Payer: Self-pay

## 2015-05-18 ENCOUNTER — Telehealth: Payer: Self-pay | Admitting: Unknown Physician Specialty

## 2015-05-18 LAB — ANAEROBIC CULTURE

## 2015-05-18 NOTE — Telephone Encounter (Signed)
[employer] called and wanted to let Elnita Maxwell and I know that this patient was discharged from the hospital on 05/15/15.

## 2015-05-18 NOTE — Telephone Encounter (Signed)
Renee Bridges is not here Please see if they can get the orthopaedist to do this since he was the admitting surgeon for this condition

## 2015-05-19 NOTE — Telephone Encounter (Signed)
[name] called stated pt's insurance company stated the request has to come from the pt's PCP for home health assistance. Please contact pt's daughter Renee Bridges with any questions or issues @ [phone]. [name] stated this assistance is needed ASAP. Pt can not be left alone for extended periods of time and there is no one else to provide assistance. Thanks.

## 2015-05-20 NOTE — Telephone Encounter (Signed)
Routing to provider  

## 2015-05-20 NOTE — Telephone Encounter (Signed)
I called patient's home number to talk with her, but no answer; I left a message asking patient to please call me back I looked under media tab to find HIPAA paperwork; I only see 02/25/15 note that patient received privacy practices, but don't see typical forms Please help me find the paperwork that says it's okay to talk with this daughter before I call her

## 2015-05-20 NOTE — Telephone Encounter (Signed)
PTS DAUGHTER CALLED IN AND STATED THAT HER MOTHER HAD MISSED A CALL AND I THEN INFORMED HER THAT SHE(THE DAUGHTER) WAS NOT ON THE DESIGNATED PARTY RELEASE SO WE WOULDN'T BE ABLE TO DISCLOSE ANY INFORMATION TO HER. SHE THEN EXPLAINED THAT HER MOTHER WAS STAYING WITH HER SINCE SHE HAD THE SURGERY AND HER PHONE NUMBER WOULD BE THE ONE TO BEST REACH HER MOM ON. SHE ALSO STATED THAT SHE WOULD COME BY AND FILL OUT THE PAPERWORK AT HER EARLIEST CONVENIENCE.

## 2015-05-21 NOTE — Telephone Encounter (Signed)
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, patient is home with her I offered to see if there an agency we can call to try to get help; she has arrangements for tomorrow, friend will sit with her for 4-5 hours; she would like next week to get a few hours to sit with her; she has Medicaid I will ask staff to work on referral for home health aide (PCS) through Medicaid to help with basic needs, sit with her for a few hours each day (daughter can help them decide how much time, needed, but I'm going to guess 3-4 hours a day) -------------------------------------- TIFFANY -- please arrange for patient to get a home health aide ("Personal Care Services") through Medicaid ASAP (daughter says she has Medicaid and Medicare, but I can't confirm this); status post knee surgery; needs assistance during post-op period 3-4 hours a day, talk with daughter for more details; agency that can help the fastest is fine with me; there is no official referral order that I can find for [facility]

## 2015-05-28 NOTE — Telephone Encounter (Signed)
Home health aid referral paper filled out and faxed.

## 2015-06-04 ENCOUNTER — Telehealth: Payer: Self-pay

## 2015-06-04 NOTE — Telephone Encounter (Signed)
Called and spoke to [place]. I called to let her know that the place we sent a referral to for the patient for personal care services could not take her at the time. [name] stated that they didn't really even need the services anymore. But [name] did say that she wanted the patient to be seen because a large cyst was found on one of the patient's kidneys during an MRI done by Dr. Shon Baton. [name] stated she was going to call to make sure those MRI results were sent to [place] and is going to call us back to schedule the patient an appointment

## 2015-11-16 ENCOUNTER — Telehealth: Payer: Self-pay

## 2015-11-16 NOTE — Telephone Encounter (Signed)
Contacted patient to check on her since her last visit at our office was on 03/26/15.  Patient states that she has not been in for an appt because she does not have a way to get to the doctor.  Patient states that her daughter will no longer take her to her appointments. I will [facility] this issue to [facility] and request that someone make a home visit to see this patient.

## 2015-12-27 ENCOUNTER — Ambulatory Visit (INDEPENDENT_AMBULATORY_CARE_PROVIDER_SITE_OTHER): Payer: Medicare Other | Admitting: Unknown Physician Specialty

## 2015-12-27 ENCOUNTER — Encounter: Payer: Self-pay | Admitting: Unknown Physician Specialty

## 2015-12-27 VITALS — BP 148/81 | HR 76 | Temp 97.5°F | Ht 61.6 in | Wt 184.8 lb

## 2015-12-27 DIAGNOSIS — F32A Depression, unspecified: Secondary | ICD-10-CM

## 2015-12-27 DIAGNOSIS — F329 Major depressive disorder, single episode, unspecified: Secondary | ICD-10-CM

## 2015-12-27 DIAGNOSIS — R5382 Chronic fatigue, unspecified: Secondary | ICD-10-CM | POA: Insufficient documentation

## 2015-12-27 DIAGNOSIS — I10 Essential (primary) hypertension: Secondary | ICD-10-CM

## 2015-12-27 DIAGNOSIS — Z789 Other specified health status: Secondary | ICD-10-CM

## 2015-12-27 DIAGNOSIS — Z23 Encounter for immunization: Secondary | ICD-10-CM

## 2015-12-27 DIAGNOSIS — Z0000 Encounter for general adult medical examination without abnormal findings: Secondary | ICD-10-CM | POA: Diagnosis not present

## 2015-12-27 DIAGNOSIS — I129 Hypertensive chronic kidney disease with stage 1 through stage 4 chronic kidney disease, or unspecified chronic kidney disease: Secondary | ICD-10-CM | POA: Insufficient documentation

## 2015-12-27 DIAGNOSIS — Z7409 Other reduced mobility: Secondary | ICD-10-CM

## 2015-12-27 MED ORDER — HYDROCHLOROTHIAZIDE 25 MG PO TABS: 25.0000 mg | ORAL_TABLET | Freq: Every day | ORAL | Status: DC

## 2015-12-27 MED ORDER — CITALOPRAM HYDROBROMIDE 20 MG PO TABS: 20.0000 mg | ORAL_TABLET | Freq: Every day | ORAL | Status: DC

## 2015-12-27 NOTE — Assessment & Plan Note (Signed)
Start HCTZ 25 mg daily.

## 2015-12-27 NOTE — Assessment & Plan Note (Signed)
Start Citalopram 20 mg daily.   

## 2015-12-27 NOTE — Progress Notes (Signed)
BP 148/81 mmHg  Pulse 76  Temp(Src) 97.5 F (36.4 C)  Ht 5' 1.6" (1.565 m)  Wt 184 lb 12.8 oz (83.825 kg)  BMI 34.23 kg/m2  SpO2 92%  LMP  (LMP Unknown)   Subjective:    Patient ID: Renee Bridges, female    DOB: 1946-05-23, 70 y.o.   MRN: [id number]  HPI: Renee Bridges is a 70 y.o. female  Chief Complaint  Patient presents with  . Depression  . Hypertension   Pt is here for follow-up of depression and BP  She has been on medications for these in the past but was lost to f/u for her knee.    Depression Not depressed "like I used to be."  She does not want to be on medication partly due to cost.  She does get depressed with her inability to do things.   . Depression screen [facility] 2/9 12/27/2015 03/26/2015  Decreased Interest 2 2  Down, Depressed, Hopeless 0 0  PHQ - 2 Score 2 2  Altered sleeping 3 3  Tired, decreased energy 2 3  Change in appetite 2 3  Feeling bad or failure about yourself  1 2  Trouble concentrating 0 2  Moving slowly or fidgety/restless 0 2  Suicidal thoughts 0 1  PHQ-9 Score 10 18    Hypertension Using medications without difficulty Average home BPs: usually above 140/90.   No problems or lightheadedness No chest pain with exertion or shortness of breath No Edema States she doesn't eat much   Knee pain She states it still hurts following right TKA.  She would like a lift chair as difficult to stand after sitting.  She is here with her Child psychotherapist.  She needs help with housecleaning and bathing her dog.  She does feel she can to her ADLs most of the time.  But, she cannot reach her back.  Sometimes she might fall due to trying to get things done.     Relevant past medical, surgical, family and social history reviewed and updated as indicated. Interim medical history since our last visit reviewed. Allergies and medications reviewed and updated.  Review of Systems  Per HPI unless specifically indicated above     Objective:    BP 148/81  mmHg  Pulse 76  Temp(Src) 97.5 F (36.4 C)  Ht 5' 1.6" (1.565 m)  Wt 184 lb 12.8 oz (83.825 kg)  BMI 34.23 kg/m2  SpO2 92%  LMP  (LMP Unknown)  Wt Readings from Last 3 Encounters:  12/27/15 184 lb 12.8 oz (83.825 kg)  05/13/15 190 lb (86.183 kg)  05/06/15 190 lb (86.183 kg)    Physical Exam  Constitutional: She is oriented to person, place, and time. She appears well-developed and well-nourished. No distress.  HENT:  Head: Normocephalic and atraumatic.  Eyes: Conjunctivae and lids are normal. Right eye exhibits no discharge. Left eye exhibits no discharge. No scleral icterus.  Neck: Normal range of motion. Neck supple. No JVD present. Carotid bruit is not present.  Cardiovascular: Normal rate, regular rhythm and normal heart sounds.   Pulmonary/Chest: Effort normal and breath sounds normal.  Abdominal: Normal appearance. There is no splenomegaly or hepatomegaly.  Musculoskeletal: Normal range of motion.  Neurological: She is alert and oriented to person, place, and time.  Skin: Skin is warm, dry and intact. No rash noted. No pallor.  Psychiatric: She has a normal mood and affect. Her behavior is normal. Judgment and thought content normal.  Assessment & Plan:   Problem List Items Addressed This Visit      Unprioritized   Chronic fatigue   Depression    Start Citalopram 20 mg daily      Relevant Medications   citalopram (CELEXA) 20 MG tablet   Other Relevant Orders   TSH   CBC with Differential/Platelet   Essential hypertension    Start HCTZ 25 mg daily      Relevant Medications   hydrochlorothiazide (HYDRODIURIL) 25 MG tablet   Other Relevant Orders   Comprehensive metabolic panel   Lipid Panel w/o Chol/HDL Ratio    Other Visit Diagnoses    Health care maintenance    -  Primary    Relevant Orders    Hepatitis C antibody    Need for pneumococcal vaccination        Relevant Orders    Pneumococcal polysaccharide vaccine 23-valent greater than or equal to 2yo  subcutaneous/IM (Completed)    Impaired mobility and ADLs        due to bad knee.  The social worker will bring by a form to start evaluations.  Lift chair prescription written        Follow up plan: Return in about 4 weeks (around 01/24/2016).

## 2015-12-28 ENCOUNTER — Encounter: Payer: Self-pay | Admitting: Unknown Physician Specialty

## 2015-12-28 LAB — COMPREHENSIVE METABOLIC PANEL
A/G RATIO: 1.7 (ref 1.2–2.2)
ALBUMIN: 4.3 g/dL (ref 3.5–4.8)
ALT: 18 IU/L (ref 0–32)
AST: 20 IU/L (ref 0–40)
Alkaline Phosphatase: 73 IU/L (ref 39–117)
BILIRUBIN TOTAL: 0.4 mg/dL (ref 0.0–1.2)
BUN / CREAT RATIO: 10 — AB (ref 12–28)
BUN: 10 mg/dL (ref 8–27)
CALCIUM: 9.7 mg/dL (ref 8.7–10.3)
CHLORIDE: 100 mmol/L (ref 96–106)
CO2: 25 mmol/L (ref 18–29)
Creatinine, Ser: 0.98 mg/dL (ref 0.57–1.00)
GFR, EST AFRICAN AMERICAN: 67 mL/min/{1.73_m2} (ref >59)
GFR, EST NON AFRICAN AMERICAN: 58 mL/min/{1.73_m2} — AB (ref >59)
GLUCOSE: 89 mg/dL (ref 65–99)
Globulin, Total: 2.6 g/dL (ref 1.5–4.5)
Potassium: 4.8 mmol/L (ref 3.5–5.2)
Sodium: 141 mmol/L (ref 134–144)
TOTAL PROTEIN: 6.9 g/dL (ref 6.0–8.5)

## 2015-12-28 LAB — LIPID PANEL W/O CHOL/HDL RATIO
Cholesterol, Total: 220 mg/dL — ABNORMAL HIGH (ref 100–199)
HDL: 42 mg/dL (ref >39)
LDL Calculated: 130 mg/dL — ABNORMAL HIGH (ref 0–99)
Triglycerides: 240 mg/dL — ABNORMAL HIGH (ref 0–149)
VLDL CHOLESTEROL CAL: 48 mg/dL — AB (ref 5–40)

## 2015-12-28 LAB — CBC WITH DIFFERENTIAL/PLATELET
BASOS ABS: 0 10*3/uL (ref 0.0–0.2)
Basos: 1 %
EOS (ABSOLUTE): 0.1 10*3/uL (ref 0.0–0.4)
Eos: 2 %
HEMATOCRIT: 47.1 % — AB (ref 34.0–46.6)
Hemoglobin: 15.7 g/dL (ref 11.1–15.9)
IMMATURE GRANULOCYTES: 0 %
Immature Grans (Abs): 0 10*3/uL (ref 0.0–0.1)
LYMPHS ABS: 2.8 10*3/uL (ref 0.7–3.1)
Lymphs: 34 %
MCH: 31.5 pg (ref 26.6–33.0)
MCHC: 33.3 g/dL (ref 31.5–35.7)
MCV: 94 fL (ref 79–97)
MONOS ABS: 0.6 10*3/uL (ref 0.1–0.9)
Monocytes: 7 %
NEUTROS PCT: 56 %
Neutrophils Absolute: 4.7 10*3/uL (ref 1.4–7.0)
PLATELETS: 249 10*3/uL (ref 150–379)
RBC: 4.99 x10E6/uL (ref 3.77–5.28)
RDW: 13.4 % (ref 12.3–15.4)
WBC: 8.3 10*3/uL (ref 3.4–10.8)

## 2015-12-28 LAB — HEPATITIS C ANTIBODY

## 2015-12-28 LAB — TSH: TSH: 2.55 u[IU]/mL (ref 0.450–4.500)

## 2016-01-25 ENCOUNTER — Ambulatory Visit: Payer: Medicare Other | Admitting: Unknown Physician Specialty

## 2016-06-13 ENCOUNTER — Other Ambulatory Visit: Payer: Self-pay | Admitting: Orthopedic Surgery

## 2016-06-13 DIAGNOSIS — M47816 Spondylosis without myelopathy or radiculopathy, lumbar region: Secondary | ICD-10-CM

## 2016-07-17 ENCOUNTER — Encounter: Payer: Self-pay | Admitting: Unknown Physician Specialty

## 2016-07-17 ENCOUNTER — Ambulatory Visit (INDEPENDENT_AMBULATORY_CARE_PROVIDER_SITE_OTHER): Payer: Medicare Other | Admitting: Unknown Physician Specialty

## 2016-07-17 VITALS — BP 124/79 | HR 76 | Temp 97.6°F | Ht 62.0 in | Wt 178.0 lb

## 2016-07-17 DIAGNOSIS — K5900 Constipation, unspecified: Secondary | ICD-10-CM

## 2016-07-17 DIAGNOSIS — F322 Major depressive disorder, single episode, severe without psychotic features: Secondary | ICD-10-CM

## 2016-07-17 DIAGNOSIS — N281 Cyst of kidney, acquired: Secondary | ICD-10-CM | POA: Diagnosis not present

## 2016-07-17 DIAGNOSIS — K5904 Chronic idiopathic constipation: Secondary | ICD-10-CM | POA: Diagnosis not present

## 2016-07-17 DIAGNOSIS — Z23 Encounter for immunization: Secondary | ICD-10-CM | POA: Diagnosis not present

## 2016-07-17 DIAGNOSIS — I10 Essential (primary) hypertension: Secondary | ICD-10-CM

## 2016-07-17 HISTORY — DX: Constipation, unspecified: K59.00

## 2016-07-17 MED ORDER — DULOXETINE HCL 60 MG PO CPEP: 60.0000 mg | ORAL_CAPSULE | Freq: Every day | ORAL | 5 refills | Status: DC

## 2016-07-17 MED ORDER — HYDROCHLOROTHIAZIDE 25 MG PO TABS: 25.0000 mg | ORAL_TABLET | Freq: Every day | ORAL | 3 refills | Status: DC

## 2016-07-17 NOTE — Patient Instructions (Addendum)

## 2016-07-17 NOTE — Assessment & Plan Note (Signed)
Stable, continue present medications.   

## 2016-07-17 NOTE — Progress Notes (Signed)
BP 124/79 (BP Location: Left Arm, Cuff Size: Large)   Pulse 76   Temp 97.6 F (36.4 C)   Ht 5\' 2"  (1.575 m)   Wt 178 lb (80.7 kg)   LMP  (LMP Unknown)   SpO2 97%   BMI 32.56 kg/m    Subjective:    Patient ID: Renee Bridges, female    DOB: 06/02/1946, 70 y.o.   MRN: [id number]  HPI: Renee MannDella S Swaminathan is a 70 y.o. female  Chief Complaint  Patient presents with  . other    pt states she had MRI at orthopedics and found a spot on right kidney, states she talked to her insurance company and states they will pay for a scooter since we has to walk and it causes pain in her back  . Constipation    pt states she has been having a hard time using the bathroom and having to strain. States it is very painful.   . Medication Refill    pt states she needs refills on hctz and citalopram   Kidney cyst Pt [facility].  Pt brought in an MRI showing a 6.5 cm cyst on right pole of kidney.    Constipation States she is having trouble with her BMs and needs to strain.    Hypertension Using medications without difficulty Average home BPs Not taking   No problems or lightheadedness No chest pain with exertion or shortness of breath No Edema  Depression Pt is currently on Citalopram and PHQ 9 scores are high but out of Citalopram [facility] screen [facility] 07/17/2016 12/27/2015 03/26/2015  Decreased Interest 3 2 2   Down, Depressed, Hopeless 3 0 0  PHQ - 2 Score 6 2 2   Altered sleeping 3 3 3   Tired, decreased energy 3 2 3   Change in appetite 3 2 3   Feeling bad or failure about yourself  3 1 2   Trouble concentrating 0 0 2  Moving slowly or fidgety/restless 0 0 2  Suicidal thoughts 0 0 1  PHQ-9 Score 18 10 18      Immobility Pt states she has a lot of pain when whe walks around and can walk about a block before she starts having pain.  States she needs to walk 3 blocks to do errans and would like an [employer] scooter.      Relevant past medical, surgical,  family and social history reviewed and updated as indicated. Interim medical history since our last visit reviewed. Allergies and medications reviewed and updated.  Review of Systems  Per HPI unless specifically indicated above     Objective:    BP 124/79 (BP Location: Left Arm, Cuff Size: Large)   Pulse 76   Temp 97.6 F (36.4 C)   Ht 5\' 2"  (1.575 m)   Wt 178 lb (80.7 kg)   LMP  (LMP Unknown)   SpO2 97%   BMI 32.56 kg/m   Wt Readings from Last 3 Encounters:  07/17/16 178 lb (80.7 kg)  12/27/15 184 lb 12.8 oz (83.8 kg)  05/13/15 190 lb (86.2 kg)    Physical Exam  Constitutional: She is oriented to person, place, and time. She appears well-developed and well-nourished. No distress.  HENT:  Head: Normocephalic and atraumatic.  Eyes: Conjunctivae and lids are normal. Right eye exhibits no discharge. Left eye exhibits no discharge. No scleral icterus.  Neck: Normal range of motion. Neck supple. No JVD present. Carotid bruit is not present.  Cardiovascular: Normal  rate, regular rhythm and normal heart sounds.   Pulmonary/Chest: Effort normal and breath sounds normal.  Abdominal: Normal appearance. There is no splenomegaly or hepatomegaly.  Musculoskeletal: Normal range of motion.  Neurological: She is alert and oriented to person, place, and time.  Skin: Skin is warm, dry and intact. No rash noted. No pallor.  Psychiatric: She has a normal mood and affect. Her behavior is normal. Judgment and thought content normal.    Results for orders placed or performed in visit on 12/27/15  Hepatitis C antibody  Result Value Ref Range   Hep C Virus Ab <0.1 0.0 - 0.9 s/co ratio  Comprehensive metabolic panel  Result Value Ref Range   Glucose 89 65 - 99 mg/dL   BUN 10 8 - 27 mg/dL   Creatinine, Ser 7.060.98 0.57 - 1.00 mg/dL   GFR calc non Af Amer 58 (L) >59 mL/min/1.73   GFR calc Af Amer 67 >59 mL/min/1.73   BUN/Creatinine Ratio 10 (L) 12 - 28   Sodium 141 134 - 144 mmol/L   Potassium  4.8 3.5 - 5.2 mmol/L   Chloride 100 96 - 106 mmol/L   CO2 25 18 - 29 mmol/L   Calcium 9.7 8.7 - 10.3 mg/dL   Total Protein 6.9 6.0 - 8.5 g/dL   Albumin 4.3 3.5 - 4.8 g/dL   Globulin, Total 2.6 1.5 - 4.5 g/dL   Albumin/Globulin Ratio 1.7 1.2 - 2.2   Bilirubin Total 0.4 0.0 - 1.2 mg/dL   Alkaline Phosphatase 73 39 - 117 IU/L   AST 20 0 - 40 IU/L   ALT 18 0 - 32 IU/L  Lipid Panel w/o Chol/HDL Ratio  Result Value Ref Range   Cholesterol, Total 220 (H) 100 - 199 mg/dL   Triglycerides 237240 (H) 0 - 149 mg/dL   HDL 42 >62>39 mg/dL   VLDL Cholesterol Cal 48 (H) 5 - 40 mg/dL   LDL Calculated 831130 (H) 0 - 99 mg/dL  TSH  Result Value Ref Range   TSH 2.550 0.450 - 4.500 uIU/mL  CBC with Differential/Platelet  Result Value Ref Range   WBC 8.3 3.4 - 10.8 x10E3/uL   RBC 4.99 3.77 - 5.28 x10E6/uL   Hemoglobin 15.7 11.1 - 15.9 g/dL   Hematocrit 51.747.1 (H) 61.634.0 - 46.6 %   MCV 94 79 - 97 fL   MCH 31.5 26.6 - 33.0 pg   MCHC 33.3 31.5 - 35.7 g/dL   RDW 07.313.4 71.012.3 - 62.615.4 %   Platelets 249 150 - 379 x10E3/uL   Neutrophils 56 %   Lymphs 34 %   Monocytes 7 %   Eos 2 %   Basos 1 %   Neutrophils Absolute 4.7 1.4 - 7.0 x10E3/uL   Lymphocytes Absolute 2.8 0.7 - 3.1 x10E3/uL   Monocytes Absolute 0.6 0.1 - 0.9 x10E3/uL   EOS (ABSOLUTE) 0.1 0.0 - 0.4 x10E3/uL   Basophils Absolute 0.0 0.0 - 0.2 x10E3/uL   Immature Granulocytes 0 %   Immature Grans (Abs) 0.0 0.0 - 0.1 x10E3/uL      Assessment & Plan:   Problem List Items Addressed This Visit    None    Visit Diagnoses    Need for influenza vaccination    -  Primary   Relevant Orders   Flu vaccine HIGH DOSE PF (Completed)       Follow up plan: No Follow-up on file.

## 2016-07-17 NOTE — Assessment & Plan Note (Signed)
Refer to urology.  ?

## 2016-07-17 NOTE — Assessment & Plan Note (Signed)
Change to duloxetine due to chronic back pain

## 2016-07-17 NOTE — Assessment & Plan Note (Signed)
Recommended Miralax 

## 2016-07-18 ENCOUNTER — Telehealth: Payer: Self-pay | Admitting: Unknown Physician Specialty

## 2016-07-18 NOTE — Telephone Encounter (Signed)
Called pharmacy. They state that they got the prescription for duloxetine yesterday but no others so I called in the hydrochlorothiazide prescription. Will call patient and let her know.

## 2016-07-18 NOTE — Telephone Encounter (Signed)
Pt called stated the pharmacy has not received refill requests for either of her medications. Can the RX's be resent to [facility]. Thanks.

## 2016-07-18 NOTE — Telephone Encounter (Signed)
Called and let patient know that I called in her medication.

## 2016-08-08 ENCOUNTER — Telehealth: Payer: Self-pay | Admitting: Unknown Physician Specialty

## 2016-08-08 NOTE — Telephone Encounter (Signed)
Patient called to see if we could fax a script for a scooter, which Dr Jamesetta OrleansWicker had written and gave to her at her last visit, patient mailed the script but per pharmacy it was never received.  Patient would like the script to be resent and faxed to Attn: Misty StanleyLisa Fax # [phone]

## 2016-08-09 NOTE — Telephone Encounter (Signed)
RX written, signed by Elnita Maxwellheryl, and faxed to the number provided by the patient. Copy placed to be scanned into chart.

## 2016-08-14 ENCOUNTER — Ambulatory Visit (INDEPENDENT_AMBULATORY_CARE_PROVIDER_SITE_OTHER): Payer: Medicare Other | Admitting: Unknown Physician Specialty

## 2016-08-14 ENCOUNTER — Encounter: Payer: Self-pay | Admitting: Unknown Physician Specialty

## 2016-08-14 DIAGNOSIS — F322 Major depressive disorder, single episode, severe without psychotic features: Secondary | ICD-10-CM | POA: Diagnosis not present

## 2016-08-14 NOTE — Progress Notes (Signed)
BP 124/74 (BP Location: Left Arm, Patient Position: Sitting, Cuff Size: Large)   Pulse 68   Temp 97.8 F (36.6 C)   Wt 177 lb 6.4 oz (80.5 kg)   LMP  (LMP Unknown)   SpO2 97%   BMI 32.45 kg/m    Subjective:    Patient ID: Renee Bridges, female    DOB: 05/29/1946, 70 y.o.   MRN: [id number]  HPI: Renee Bridges is a 70 y.o. female  Chief Complaint  Patient presents with  . Depression   Depression Pt states she is sad about missing her brother, sister, and mom.  This is particularly had around the holidays.  She was initially on Citalopram and now on Cymbalta which she thinks is an improvement but is lonely as she is estranged from her daughter.  She lives younger daughter which is a stressful environments  [facility] 08/14/2016 07/17/2016 12/27/2015 03/26/2015  Decreased Interest 3 3 2 2   Down, Depressed, Hopeless 3 3 0 0  PHQ - 2 Score 6 6 2 2   Altered sleeping 3 3 3 3   Tired, decreased energy 2 3 2 3   Change in appetite 2 3 2 3   Feeling bad or failure about yourself  0 3 1 2   Trouble concentrating 3 0 0 2  Moving slowly or fidgety/restless 3 0 0 2  Suicidal thoughts 0 0 0 1  PHQ-9 Score 19 18 10 18   Difficult doing work/chores Very difficult - - -     Relevant past medical, surgical, family and social history reviewed and updated as indicated. Interim medical history since our last visit reviewed. Allergies and medications reviewed and updated.  Review of Systems  Per HPI unless specifically indicated above     Objective:    BP 124/74 (BP Location: Left Arm, Patient Position: Sitting, Cuff Size: Large)   Pulse 68   Temp 97.8 F (36.6 C)   Wt 177 lb 6.4 oz (80.5 kg)   LMP  (LMP Unknown)   SpO2 97%   BMI 32.45 kg/m   Wt Readings from Last 3 Encounters:  08/14/16 177 lb 6.4 oz (80.5 kg)  07/17/16 178 lb (80.7 kg)  12/27/15 184 lb 12.8 oz (83.8 kg)    Physical Exam  Constitutional: She is oriented to person, place, and time. She appears  well-developed and well-nourished. No distress.  HENT:  Head: Normocephalic and atraumatic.  Eyes: Conjunctivae and lids are normal. Right eye exhibits no discharge. Left eye exhibits no discharge. No scleral icterus.  Neck: Normal range of motion. Neck supple. No JVD present. Carotid bruit is not present.  Cardiovascular: Normal rate, regular rhythm and normal heart sounds.   Pulmonary/Chest: Effort normal and breath sounds normal.  Abdominal: Normal appearance. There is no splenomegaly or hepatomegaly.  Musculoskeletal: Normal range of motion.  Neurological: She is alert and oriented to person, place, and time.  Skin: Skin is warm, dry and intact. No rash noted. No pallor.  Psychiatric: She has a normal mood and affect. Her behavior is normal. Judgment and thought content normal.    Results for orders placed or performed in visit on 12/27/15  Hepatitis C antibody  Result Value Ref Range   Hep C Virus Ab <0.1 0.0 - 0.9 s/co ratio  Comprehensive metabolic panel  Result Value Ref Range   Glucose 89 65 - 99 mg/dL   BUN 10 8 - 27 mg/dL   Creatinine, Ser 4.090.98 0.57 - 1.00 mg/dL   GFR  calc non Af Amer 58 (L) >59 mL/min/1.73   GFR calc Af Amer 67 >59 mL/min/1.73   BUN/Creatinine Ratio 10 (L) 12 - 28   Sodium 141 134 - 144 mmol/L   Potassium 4.8 3.5 - 5.2 mmol/L   Chloride 100 96 - 106 mmol/L   CO2 25 18 - 29 mmol/L   Calcium 9.7 8.7 - 10.3 mg/dL   Total Protein 6.9 6.0 - 8.5 g/dL   Albumin 4.3 3.5 - 4.8 g/dL   Globulin, Total 2.6 1.5 - 4.5 g/dL   Albumin/Globulin Ratio 1.7 1.2 - 2.2   Bilirubin Total 0.4 0.0 - 1.2 mg/dL   Alkaline Phosphatase 73 39 - 117 IU/L   AST 20 0 - 40 IU/L   ALT 18 0 - 32 IU/L  Lipid Panel w/o Chol/HDL Ratio  Result Value Ref Range   Cholesterol, Total 220 (H) 100 - 199 mg/dL   Triglycerides 161240 (H) 0 - 149 mg/dL   HDL 42 >09>39 mg/dL   VLDL Cholesterol Cal 48 (H) 5 - 40 mg/dL   LDL Calculated 604130 (H) 0 - 99 mg/dL  TSH  Result Value Ref Range   TSH 2.550  0.450 - 4.500 uIU/mL  CBC with Differential/Platelet  Result Value Ref Range   WBC 8.3 3.4 - 10.8 x10E3/uL   RBC 4.99 3.77 - 5.28 x10E6/uL   Hemoglobin 15.7 11.1 - 15.9 g/dL   Hematocrit 54.047.1 (H) 98.134.0 - 46.6 %   MCV 94 79 - 97 fL   MCH 31.5 26.6 - 33.0 pg   MCHC 33.3 31.5 - 35.7 g/dL   RDW 19.113.4 47.812.3 - 29.515.4 %   Platelets 249 150 - 379 x10E3/uL   Neutrophils 56 %   Lymphs 34 %   Monocytes 7 %   Eos 2 %   Basos 1 %   Neutrophils Absolute 4.7 1.4 - 7.0 x10E3/uL   Lymphocytes Absolute 2.8 0.7 - 3.1 x10E3/uL   Monocytes Absolute 0.6 0.1 - 0.9 x10E3/uL   EOS (ABSOLUTE) 0.1 0.0 - 0.4 x10E3/uL   Basophils Absolute 0.0 0.0 - 0.2 x10E3/uL   Immature Granulocytes 0 %   Immature Grans (Abs) 0.0 0.0 - 0.1 x10E3/uL      Assessment & Plan:   Problem List Items Addressed This Visit      Unprioritized   Depression    Not good control.  No suicidal ideation.  Continue Cymbalta.  List of counselors given.            Follow up plan: Return in about 6 weeks (around 09/25/2016).

## 2016-08-14 NOTE — Assessment & Plan Note (Addendum)
Not good control.  No suicidal ideation.  Continue Cymbalta.  List of counselors given.

## 2016-08-15 ENCOUNTER — Telehealth: Payer: Self-pay | Admitting: Unknown Physician Specialty

## 2016-08-15 NOTE — Telephone Encounter (Signed)
I did not prescribe the Gabapentin and can't change another doctors order.  She should contact that provider

## 2016-08-15 NOTE — Telephone Encounter (Signed)
Called and spoke to patient. She states that after she takes her medication at night, she has muscle spasms in her legs. She states that this has happened ever since she started the gabapentin and she has to get up and get her legs going.

## 2016-08-16 NOTE — Telephone Encounter (Signed)
Called and spoke to patient. I asked the patient who gave her the gabapentin and she stated [place]. I explained to the patient that she needed to call their office and see about switching her medication or giving her something to help her muscle spasms since they wrote the gabapentin. Patient stated she would do that and I asked for her to give us a call if she needed anything.

## 2016-08-16 NOTE — Telephone Encounter (Signed)
Called and left patient a VM asking for her to please return my call.  

## 2016-08-17 ENCOUNTER — Encounter: Payer: Self-pay | Admitting: Urology

## 2016-08-17 ENCOUNTER — Other Ambulatory Visit: Payer: Self-pay

## 2016-08-17 ENCOUNTER — Ambulatory Visit (INDEPENDENT_AMBULATORY_CARE_PROVIDER_SITE_OTHER): Payer: Medicare Other | Admitting: Urology

## 2016-08-17 VITALS — BP 118/82 | HR 72 | Ht 62.0 in | Wt 177.2 lb

## 2016-08-17 DIAGNOSIS — R3129 Other microscopic hematuria: Secondary | ICD-10-CM | POA: Diagnosis not present

## 2016-08-17 DIAGNOSIS — N281 Cyst of kidney, acquired: Secondary | ICD-10-CM

## 2016-08-17 LAB — URINALYSIS, COMPLETE
BILIRUBIN UA: NEGATIVE
Glucose, UA: NEGATIVE
KETONES UA: NEGATIVE
NITRITE UA: NEGATIVE
PH UA: 5.5 (ref 5.0–7.5)
Protein, UA: NEGATIVE
SPEC GRAV UA: 1.015 (ref 1.005–1.030)
Urobilinogen, Ur: 0.2 mg/dL (ref 0.2–1.0)

## 2016-08-17 LAB — MICROSCOPIC EXAMINATION: WBC, UA: >30 /hpf — AB (ref 0–?)

## 2016-08-17 NOTE — Progress Notes (Signed)
08/17/2016 10:02 AM   Chauncey Mannella S Olden 06/21/1946 [id number]  Referring provider: Gabriel Cirriheryl Wicker, NP [address] [place], [place] [address]  Chief Complaint  Patient presents with  . New Patient (Initial Visit)    Cyst of right kidney     HPI: The patient is a 70 y.o. Female who presents for evaluation of a 6.5 cm right simple renal cysts seen on MRI endocrine for orthopedics. She's had a known right simple renal cyst dating back to imaging from 2010. She has urinary complaints this time. She denies gross hematuria or issues with voiding. She has no history of UTIs or stones. She does have mid to lower back pain that is from an entrapped nerve. She has no other complaints at this time.  She has a history of microscopic hematuria and was formally worked up for this in her 30s or 3540s. She has microscopic hematuria again on urinalysis today. She has no history of smoking.  PMH: Past Medical History:  Diagnosis Date  . Anxiety   . Arthritis   . Asthma   . Chronic kidney disease    cyst on kidney  . Depression   . Headache   . History of hiatal hernia   . Seizures (HCC)    hx. as child  . Shortness of breath dyspnea    with excertion    Surgical History: Past Surgical History:  Procedure Laterality Date  . ABDOMINAL HYSTERECTOMY    . BACK SURGERY    . colonoscopy a year ago    . LEG SURGERY Left   . mass removed from back - 20 years ago    . TOTAL KNEE REVISION Left 05/13/2015   Procedure: REVISION LEFT  KNEE ARTHROPLASTY, REVISION PATELLA POLY EXCHANGE;  Surgeon: Samson FredericBrian Swinteck, MD;  Location: [facility];  Service: Orthopedics;  Laterality: Left;    Home Medications:    Medication List       Accurate as of 08/17/16 10:02 AM. Always use your most recent med list.          acetaminophen 500 MG tablet Commonly known as:  TYLENOL Take 500 mg by mouth every 6 (six) hours as needed.   DULoxetine 60 MG capsule Commonly known as:  CYMBALTA Take 1 capsule (60 mg total) by mouth  daily.   gabapentin 300 MG capsule Commonly known as:  NEURONTIN Take 300 mg by mouth 3 (three) times daily.   hydrochlorothiazide 25 MG tablet Commonly known as:  HYDRODIURIL Take 1 tablet (25 mg total) by mouth daily.   meloxicam 7.5 MG tablet Commonly known as:  MOBIC Take 7.5 mg by mouth daily.       Allergies:  Allergies  Allergen Reactions  . Codeine Rash    Family History: Family History  Problem Relation Age of Onset  . Cancer Mother   . Cancer Father   . Diabetes Brother   . Cancer Brother   . Cancer Brother     Social History:  reports that she has never smoked. She has never used smokeless tobacco. She reports that she does not drink alcohol or use drugs.  ROS: UROLOGY Frequent Urination?: No Hard to postpone urination?: No Burning/pain with urination?: No Get up at night to urinate?: Yes Leakage of urine?: Yes Urine stream starts and stops?: No Trouble starting stream?: No Do you have to strain to urinate?: No Blood in urine?: No Urinary tract infection?: No Sexually transmitted disease?: No Injury to kidneys or bladder?: Yes Painful intercourse?: No Weak  stream?: No Currently pregnant?: No Vaginal bleeding?: No Last menstrual period?: No  Gastrointestinal Nausea?: No Vomiting?: No Indigestion/heartburn?: No Diarrhea?: No Constipation?: No  Constitutional Fever: No Night sweats?: Yes Weight loss?: Yes Fatigue?: Yes  Skin Skin rash/lesions?: No Itching?: Yes  Eyes Blurred vision?: Yes Double vision?: No  Ears/Nose/Throat Sore throat?: No Sinus problems?: Yes  Hematologic/Lymphatic Swollen glands?: No Easy bruising?: Yes  Cardiovascular Leg swelling?: No Chest pain?: No  Respiratory Cough?: No Shortness of breath?: No  Endocrine Excessive thirst?: No  Musculoskeletal Back pain?: Yes Joint pain?: Yes  Neurological Headaches?: Yes Dizziness?: Yes  Psychologic Depression?: No Anxiety?: No  Physical  Exam: BP 118/82   Pulse 72   Ht 5\' 2"  (1.575 m)   Wt 177 lb 3.2 oz (80.4 kg)   LMP  (LMP Unknown)   BMI 32.41 kg/m   Constitutional:  Alert and oriented, No acute distress. HEENT: [place] AT, moist mucus membranes.  Trachea midline, no masses. Cardiovascular: No clubbing, cyanosis, or edema. Respiratory: Normal respiratory effort, no increased work of breathing. GI: Abdomen is soft, nontender, nondistended, no abdominal masses GU: No CVA tenderness.  Skin: No rashes, bruises or suspicious lesions. Lymph: No cervical or inguinal adenopathy. Neurologic: Grossly intact, no focal deficits, moving all 4 extremities. Psychiatric: Normal mood and affect.  Laboratory Data: Lab Results  Component Value Date   WBC 8.3 12/27/2015   HGB 12.4 05/15/2015   HCT 47.1 (H) 12/27/2015   MCV 94 12/27/2015   PLT 249 12/27/2015    Lab Results  Component Value Date   CREATININE 0.98 12/27/2015    No results found for: PSA  No results found for: TESTOSTERONE  No results found for: HGBA1C  Urinalysis No results found for: COLORURINE, APPEARANCEUR, LABSPEC, PHURINE, GLUCOSEU, HGBUR, BILIRUBINUR, KETONESUR, PROTEINUR, UROBILINOGEN, NITRITE, LEUKOCYTESUR  Assessment & Plan:    1. Microscopic hematuria Though she has had this in the past, she has not been worked up for this in over 2 decades. I think it would be reasonable to repeat a formal hematuria workup with a CT urogram followed by office cystoscopy.  2. Simple right renal cyst I discussed with the patient that this is a benign incidental finding that requires no further workup or follow-up.  This does not explain her microscopic hematuria.  Return for cytoscopy after CT.  Hildred LaserBrian James Nozomi Mettler, MD  [facility] [address], [address] [place], [place] [address] [phone]

## 2016-08-20 IMAGING — CT CT KNEE*L* W/O CM
3 series · 16 of 33 positions shown, 19 images · non-contrast
Comparison: Bone scan dated 01/21/2015

CLINICAL DATA: Left knee pain.  Total knee replacement in 5889.

EXAM:
CT OF THE LEFT KNEE WITHOUT CONTRAST
TECHNIQUE: Multidetector CT imaging of the left knee was performed according to
the standard protocol. Multiplanar CT image reconstructions were
also generated.

[Series 6: knee st 3.0 b41s · axial · 0.33mm/px · z∈[-26,+210]mm · 8 of 95 slices shown, 10 images]
[im 8/95  soft-tissue]
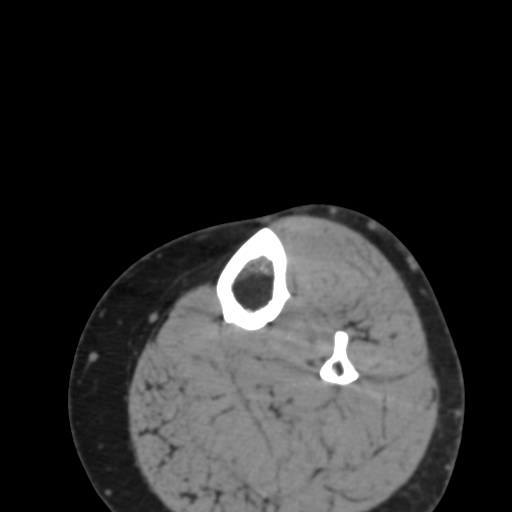
[im 8/95  bone]
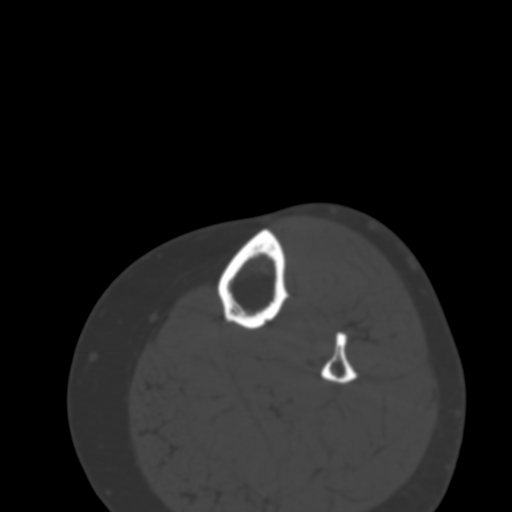
[im 22/95  bone]
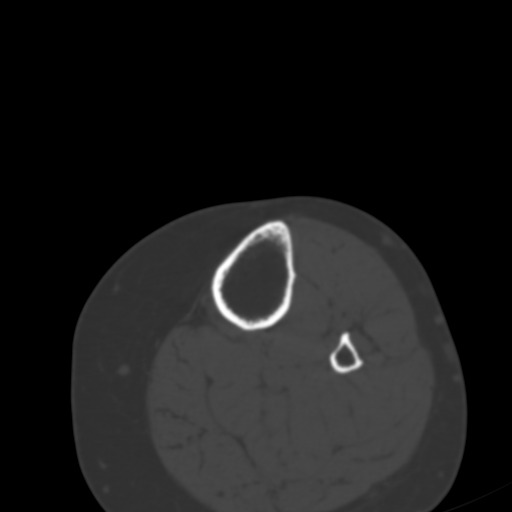
[im 29/95  bone]
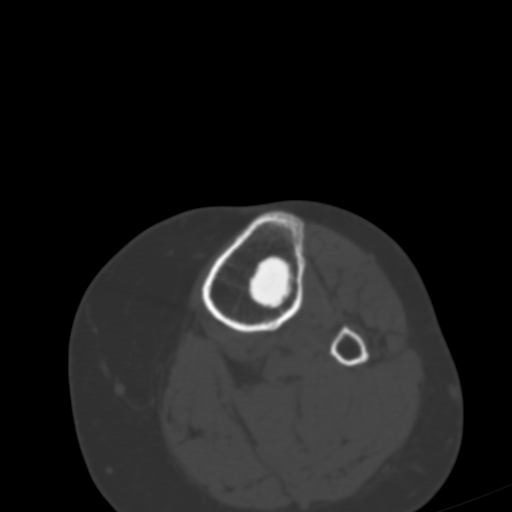
[im 44/95  bone]
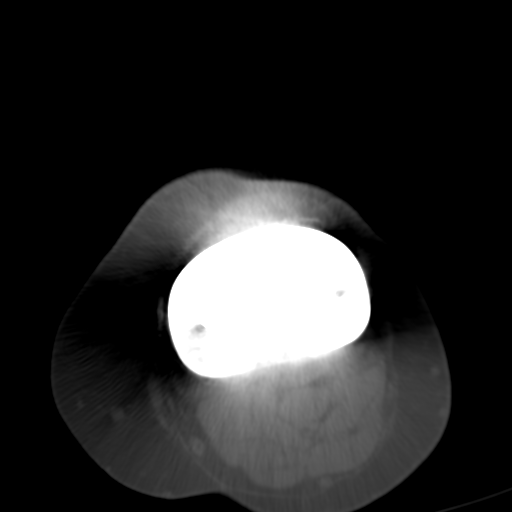
[im 51/95  soft-tissue]
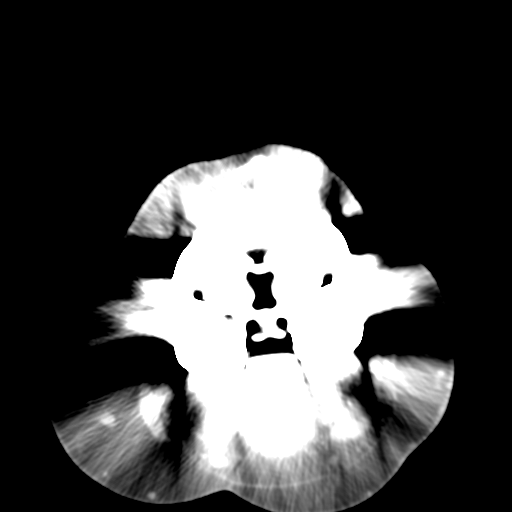
[im 51/95  bone]
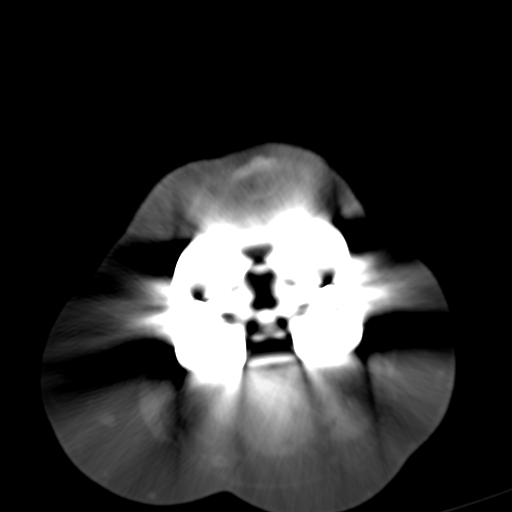
[im 66/95  bone]
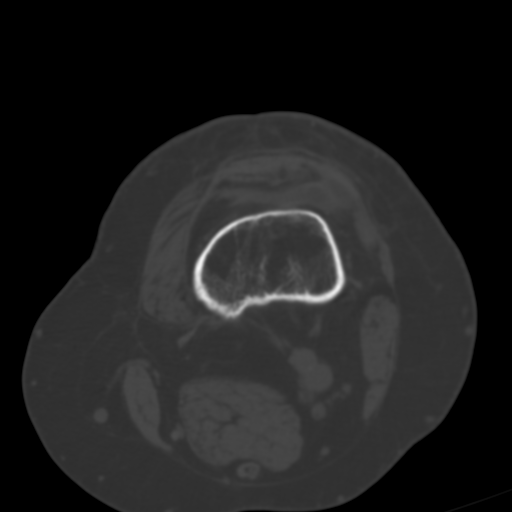
[im 73/95  bone]
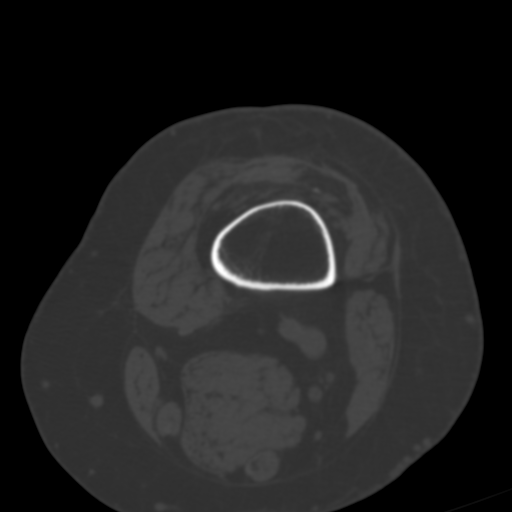
[im 87/95  bone]
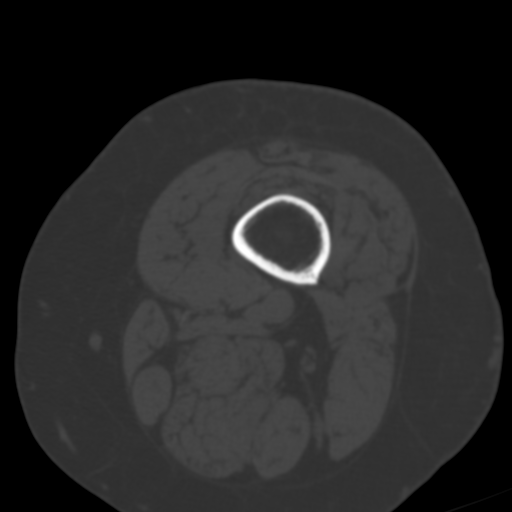

[Series 604: cor st · coronal · 0.56mm/px · 3 of 74 slices shown]
[im 15/74  bone]
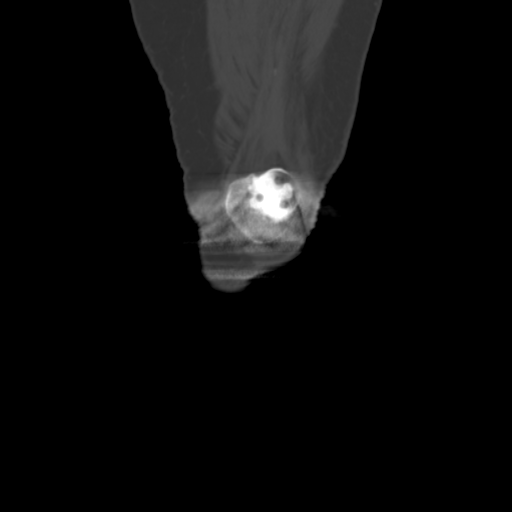
[im 30/74  bone]
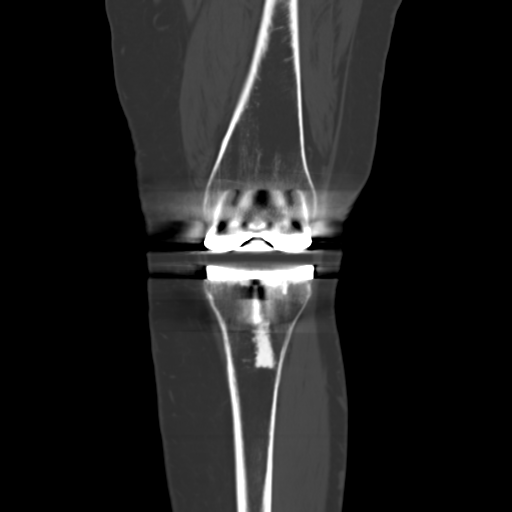
[im 44/74  bone]
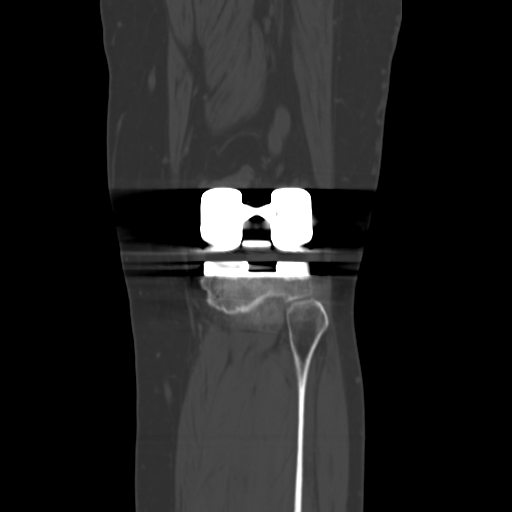

[Series 606: sag st · sagittal · 0.56mm/px · 5 of 76 slices shown, 6 images]
[im 26/76  bone]
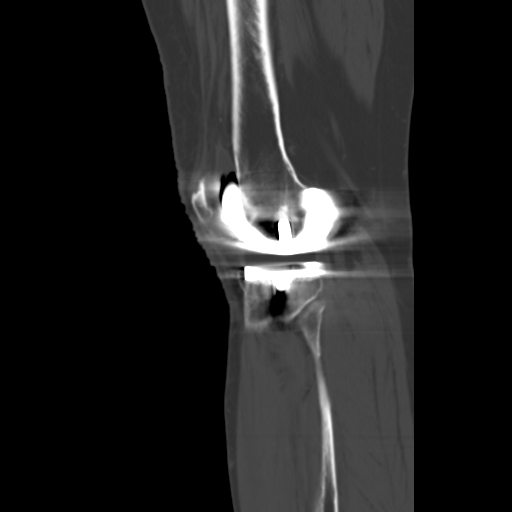
[im 32/76  bone]
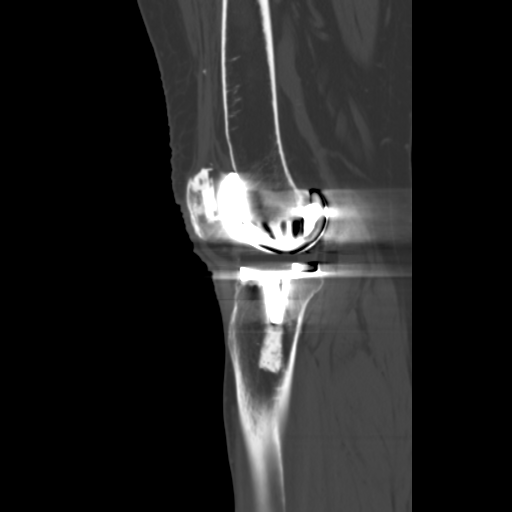
[im 38/76  soft-tissue]
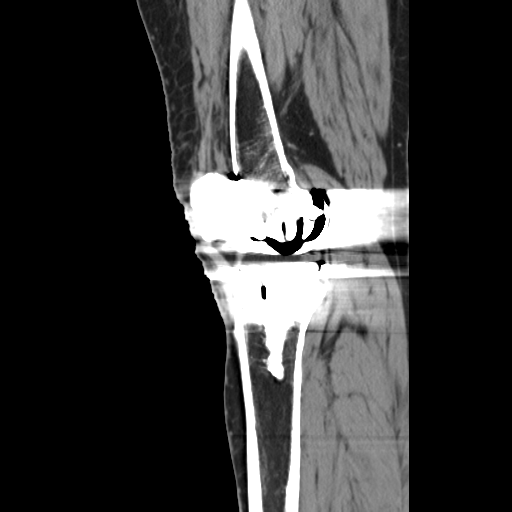
[im 38/76  bone]
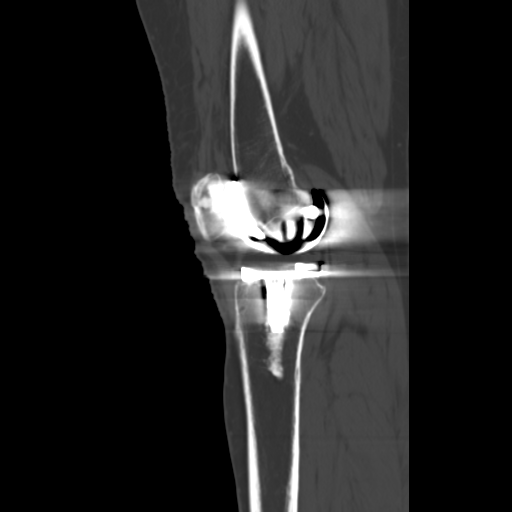
[im 44/76  bone]
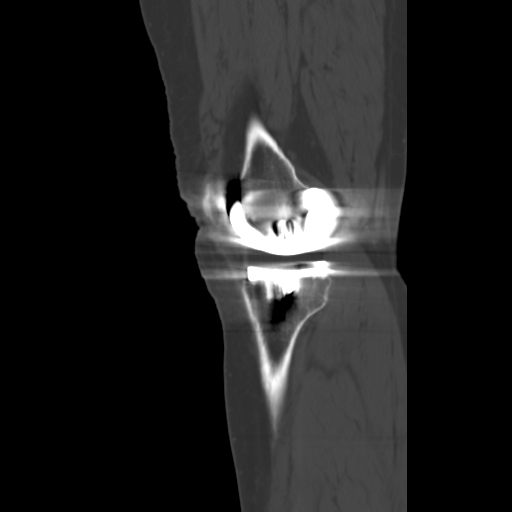
[im 51/76  bone]
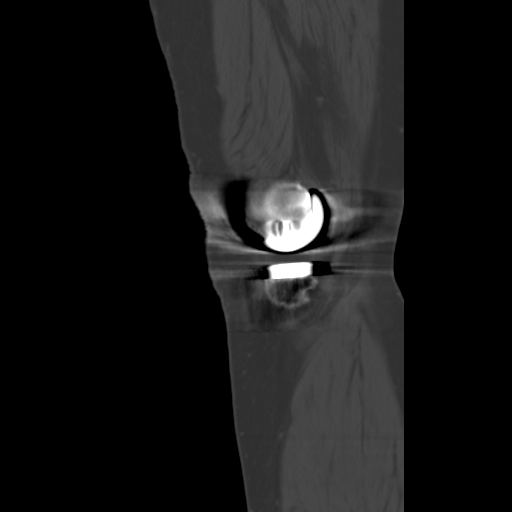

[16 of 33 positions shown; findings below may reference images not displayed]

FINDINGS: The components of the total knee prosthesis appear in good position
with no evidence of loosening of the components. There is no
fracture. There is a small knee effusion in the suprapatellar
recess.

Distal quadriceps tendon and patellar tendon are intact.
IMPRESSION: No evidence of prosthesis loosening, fracture, or other acute
osseous abnormality of the knee. Small effusion in the suprapatellar
recess of the joint.

## 2016-09-15 ENCOUNTER — Ambulatory Visit
Admission: RE | Admit: 2016-09-15 | Discharge: 2016-09-15 | Disposition: A | Discharge status: Home / Self Care | Payer: Medicare Other | Source: Ambulatory Visit | Attending: Urology | Admitting: Urology

## 2016-09-15 DIAGNOSIS — N281 Cyst of kidney, acquired: Secondary | ICD-10-CM | POA: Diagnosis not present

## 2016-09-15 DIAGNOSIS — R3129 Other microscopic hematuria: Secondary | ICD-10-CM | POA: Insufficient documentation

## 2016-09-15 DIAGNOSIS — K439 Ventral hernia without obstruction or gangrene: Secondary | ICD-10-CM | POA: Diagnosis not present

## 2016-09-15 LAB — POCT I-STAT CREATININE: Creatinine, Ser: 1 mg/dL (ref 0.44–1.00)

## 2016-09-15 MED ORDER — IOPAMIDOL (ISOVUE-300) INJECTION 61%
125.0000 mL | Freq: Once | INTRAVENOUS | Status: AC | PRN
  Administered 2016-09-15: 125 mL via INTRAVENOUS

## 2016-09-21 ENCOUNTER — Other Ambulatory Visit: Payer: Medicare Other

## 2016-09-25 ENCOUNTER — Encounter: Payer: Self-pay | Admitting: Unknown Physician Specialty

## 2016-09-25 ENCOUNTER — Ambulatory Visit (INDEPENDENT_AMBULATORY_CARE_PROVIDER_SITE_OTHER): Payer: Medicare Other | Admitting: Unknown Physician Specialty

## 2016-09-25 DIAGNOSIS — F322 Major depressive disorder, single episode, severe without psychotic features: Secondary | ICD-10-CM

## 2016-09-25 DIAGNOSIS — M549 Dorsalgia, unspecified: Secondary | ICD-10-CM

## 2016-09-25 DIAGNOSIS — M545 Low back pain, unspecified: Secondary | ICD-10-CM

## 2016-09-25 DIAGNOSIS — G8929 Other chronic pain: Secondary | ICD-10-CM

## 2016-09-25 DIAGNOSIS — Z7409 Other reduced mobility: Secondary | ICD-10-CM | POA: Diagnosis not present

## 2016-09-25 HISTORY — DX: Other reduced mobility: Z74.09

## 2016-09-25 NOTE — Assessment & Plan Note (Signed)
Due to chronic pain following failed back surgery

## 2016-09-25 NOTE — Assessment & Plan Note (Addendum)
Depression not better but pt states that it is due to lack of transportation and not having a car.  She also misses her sister who moved to the beach.  Due to see psychology in February

## 2016-09-25 NOTE — Assessment & Plan Note (Addendum)
Problems with immobility due to failed back surgery.  Continue Cymbalta which may be helping her pain

## 2016-09-25 NOTE — Progress Notes (Signed)
BP (!) 150/87 (BP Location: Left Arm, Patient Position: Sitting, Cuff Size: Large)   Pulse 69   Temp 97.4 F (36.3 C)   Wt 179 lb 6.4 oz (81.4 kg)   LMP  (LMP Unknown)   SpO2 95%   BMI 32.81 kg/m    Subjective:    Patient ID: Renee Bridges, female    DOB: 01/21/1946, 71 y.o.   MRN: [id number]  HPI: Renee MannDella S Dechellis is a 71 y.o. female  Chief Complaint  Patient presents with  . Depression    6 week f/up   Depression  Pt is here for f/u of depression.  She started Cymbalta last visit and tolerating it OK.  It seems a lot of her depression is due to needing to be driven places.  She is frustrated due to lack of ability to drive as her "family doesn't think she needs a car."    [facility] 09/25/2016 08/14/2016 07/17/2016 12/27/2015 03/26/2015  Decreased Interest 3 3 3 2 2   Down, Depressed, Hopeless 2 3 3  0 0  PHQ - 2 Score 5 6 6 2 2   Altered sleeping 3 3 3 3 3   Tired, decreased energy 3 2 3 2 3   Change in appetite 3 2 3 2 3   Feeling bad or failure about yourself  2 0 3 1 2   Trouble concentrating 2 3 0 0 2  Moving slowly or fidgety/restless 2 3 0 0 2  Suicidal thoughts 0 0 0 0 1  PHQ-9 Score 20 19 18 10 18   Difficult doing work/chores - Very difficult - - -    Scooter Pt needs a scooter due to low back pain.  Currently going to the pain clinic and due to get a stimulator.  Pt states she has to walk everywhere she goes as she doesn't own a care.  After walking 1 block she is SOB due to her back pain.  She has tried a walker and cane but feels unstable on those devices.  She does not have the ability to push a wheelchair to and from the store due to lack of upper body strength.  Doorways are not wide enough for a wheelchair.    Relevant past medical, surgical, family and social history reviewed and updated as indicated. Interim medical history since our last visit reviewed. Allergies and medications reviewed and updated.  Review of Systems  Per HPI unless  specifically indicated above     Objective:    BP (!) 150/87 (BP Location: Left Arm, Patient Position: Sitting, Cuff Size: Large)   Pulse 69   Temp 97.4 F (36.3 C)   Wt 179 lb 6.4 oz (81.4 kg)   LMP  (LMP Unknown)   SpO2 95%   BMI 32.81 kg/m   Wt Readings from Last 3 Encounters:  09/25/16 179 lb 6.4 oz (81.4 kg)  08/17/16 177 lb 3.2 oz (80.4 kg)  08/14/16 177 lb 6.4 oz (80.5 kg)    Physical Exam  Constitutional: She is oriented to person, place, and time. She appears well-developed and well-nourished. No distress.  HENT:  Head: Normocephalic and atraumatic.  Eyes: Conjunctivae and lids are normal. Right eye exhibits no discharge. Left eye exhibits no discharge. No scleral icterus.  Neck: Normal range of motion. Neck supple. No JVD present. Carotid bruit is not present.  Cardiovascular: Normal rate, regular rhythm and normal heart sounds.   Pulmonary/Chest: Effort normal and breath sounds normal.  Abdominal: Normal appearance. There is no  splenomegaly or hepatomegaly.  Musculoskeletal: Normal range of motion.  Neurological: She is alert and oriented to person, place, and time.  Skin: Skin is warm, dry and intact. No rash noted. No pallor.  Psychiatric: She has a normal mood and affect. Her behavior is normal. Judgment and thought content normal.    Results for orders placed or performed during the hospital encounter of 09/15/16  I-STAT creatinine  Result Value Ref Range   Creatinine, Ser 1.00 0.44 - 1.00 mg/dL      Assessment & Plan:   Problem List Items Addressed This Visit      Unprioritized   Chronic back pain    Problems with immobility due to failed back surgery.  Continue Cymbalta which may be helping her pain      Relevant Medications   tiZANidine (ZANAFLEX) 4 MG tablet   Depression    Depression not better but pt states that it is due to lack of transportation and not having a car.  She also misses her sister who moved to the beach.  Due to see psychology  in February      Immobility    Due to chronic pain following failed back surgery          Follow up plan: Return in about 6 weeks (around 11/06/2016).

## 2016-09-26 ENCOUNTER — Other Ambulatory Visit: Payer: Medicare Other | Admitting: Urology

## 2016-10-04 ENCOUNTER — Ambulatory Visit (INDEPENDENT_AMBULATORY_CARE_PROVIDER_SITE_OTHER): Payer: Medicare Other | Admitting: Urology

## 2016-10-04 VITALS — BP 153/82 | HR 75 | Ht 62.0 in | Wt 182.6 lb

## 2016-10-04 DIAGNOSIS — R310 Gross hematuria: Secondary | ICD-10-CM | POA: Diagnosis not present

## 2016-10-04 LAB — MICROSCOPIC EXAMINATION: BACTERIA UA: NONE SEEN

## 2016-10-04 LAB — URINALYSIS, COMPLETE
BILIRUBIN UA: NEGATIVE
GLUCOSE, UA: NEGATIVE
Ketones, UA: NEGATIVE
Nitrite, UA: NEGATIVE
PH UA: 7 (ref 5.0–7.5)
PROTEIN UA: NEGATIVE
Specific Gravity, UA: 1.015 (ref 1.005–1.030)
Urobilinogen, Ur: 0.2 mg/dL (ref 0.2–1.0)

## 2016-10-04 MED ORDER — CIPROFLOXACIN HCL 500 MG PO TABS
500.0000 mg | ORAL_TABLET | Freq: Once | ORAL | Status: AC
  Administered 2016-10-04: 500 mg via ORAL

## 2016-10-04 MED ORDER — LIDOCAINE HCL 2 % EX GEL
1.0000 "application " | Freq: Once | CUTANEOUS | Status: AC
  Administered 2016-10-04: 1 via URETHRAL

## 2016-10-04 NOTE — Progress Notes (Signed)
10/04/2016 3:58 PM   Renee Bridges 1945-09-09 [id number]  Referring provider: Gabriel Cirri, NP [address], [place] [address]  Chief Complaint  Patient presents with  . Cysto    hematuria      HPI: No gross hematuria. Frequency is stable. CT scan demonstrated cysts. She is here to complete her evaluation for microscopic hematuria  After written consent flexible cystoscopy utilizing sterile technique was performed. The bladder mucosa and trigone were normal. There is no stitch or foreign body or carcinoma. She tolerated procedure well       PMH: Past Medical History:  Diagnosis Date  . Anxiety   . Arthritis   . Asthma   . Chronic kidney disease    cyst on kidney  . Depression   . Headache   . History of hiatal hernia   . Seizures (HCC)    hx. as child  . Shortness of breath dyspnea    with excertion    Surgical History: Past Surgical History:  Procedure Laterality Date  . ABDOMINAL HYSTERECTOMY    . BACK SURGERY    . colonoscopy a year ago    . LEG SURGERY Left   . mass removed from back - 20 years ago    . TOTAL KNEE REVISION Left 05/13/2015   Procedure: REVISION LEFT  KNEE ARTHROPLASTY, REVISION PATELLA POLY EXCHANGE;  Surgeon: Samson Frederic, MD;  Location: [facility];  Service: Orthopedics;  Laterality: Left;    Home Medications:  Allergies as of 10/04/2016      Reactions   Codeine Rash      Medication List       Accurate as of 10/04/16  3:58 PM. Always use your most recent med list.          acetaminophen 500 MG tablet Commonly known as:  TYLENOL Take 500 mg by mouth every 6 (six) hours as needed.   DULoxetine 60 MG capsule Commonly known as:  CYMBALTA Take 1 capsule (60 mg total) by mouth daily.   gabapentin 300 MG capsule Commonly known as:  NEURONTIN Take 300 mg by mouth 3 (three) times daily.   hydrochlorothiazide 25 MG tablet Commonly known as:  HYDRODIURIL Take 1 tablet (25 mg total) by mouth daily.   meloxicam 7.5 MG  tablet Commonly known as:  MOBIC Take 7.5 mg by mouth daily.   tiZANidine 4 MG tablet Commonly known as:  ZANAFLEX Take 4 mg by mouth at bedtime as needed.       Allergies:  Allergies  Allergen Reactions  . Codeine Rash    Family History: Family History  Problem Relation Age of Onset  . Cancer Mother   . Cancer Father   . Diabetes Brother   . Cancer Brother   . Cancer Brother     Social History:  reports that she has never smoked. She has never used smokeless tobacco. She reports that she does not drink alcohol or use drugs.  ROS:                                        Physical Exam: BP (!) 153/82   Pulse 75   Ht 5\' 2"  (1.575 m)   Wt 182 lb 9.6 oz (82.8 kg)   LMP  (LMP Unknown)   BMI 33.40 kg/m     Laboratory Data: Lab Results  Component Value Date   WBC 8.3 12/27/2015  HGB 12.4 05/15/2015   HCT 47.1 (H) 12/27/2015   MCV 94 12/27/2015   PLT 249 12/27/2015    Lab Results  Component Value Date   CREATININE 1.00 09/15/2016    No results found for: PSA  No results found for: TESTOSTERONE  No results found for: HGBA1C  Urinalysis    Component Value Date/Time   APPEARANCEUR Hazy (A) 08/17/2016 0918   GLUCOSEU Negative 08/17/2016 0918   BILIRUBINUR Negative 08/17/2016 0918   PROTEINUR Negative 08/17/2016 0918   NITRITE Negative 08/17/2016 0918   LEUKOCYTESUR 1+ (A) 08/17/2016 0918    Pertinent Imaging: See above   Assessment & Plan:  The patient has benign microscopic hematuria. She will be seen on a when necessary basis  1. Homero FellersFrank hematuria  - Urinalysis, Complete - ciprofloxacin (CIPRO) tablet 500 mg; Take 1 tablet (500 mg total) by mouth once. - lidocaine (XYLOCAINE) 2 % jelly 1 application; Place 1 application into the urethra once.   No Follow-up on file.  Martina SinnerMACDIARMID,Rowene Suto A, MD  [facility] [address], [address] [place], [place] [address] [phone]

## 2016-10-09 ENCOUNTER — Ambulatory Visit: Payer: Medicare Other | Admitting: Unknown Physician Specialty

## 2016-10-12 LAB — HM DIABETES FOOT EXAM: HM DIABETIC FOOT EXAM: NORMAL

## 2016-10-18 ENCOUNTER — Ambulatory Visit: Payer: Medicare Other | Admitting: Unknown Physician Specialty

## 2016-11-05 IMAGING — DX DG KNEE 1-2V*L*
2 series · 2 of 2 positions shown · non-contrast
Comparison: Radiographs dated 07/26/2005

CLINICAL DATA: Revision of left total knee prosthesis with patella
poly exchange.

EXAM:
LEFT KNEE - 1-2 VIEW

[knee ap]
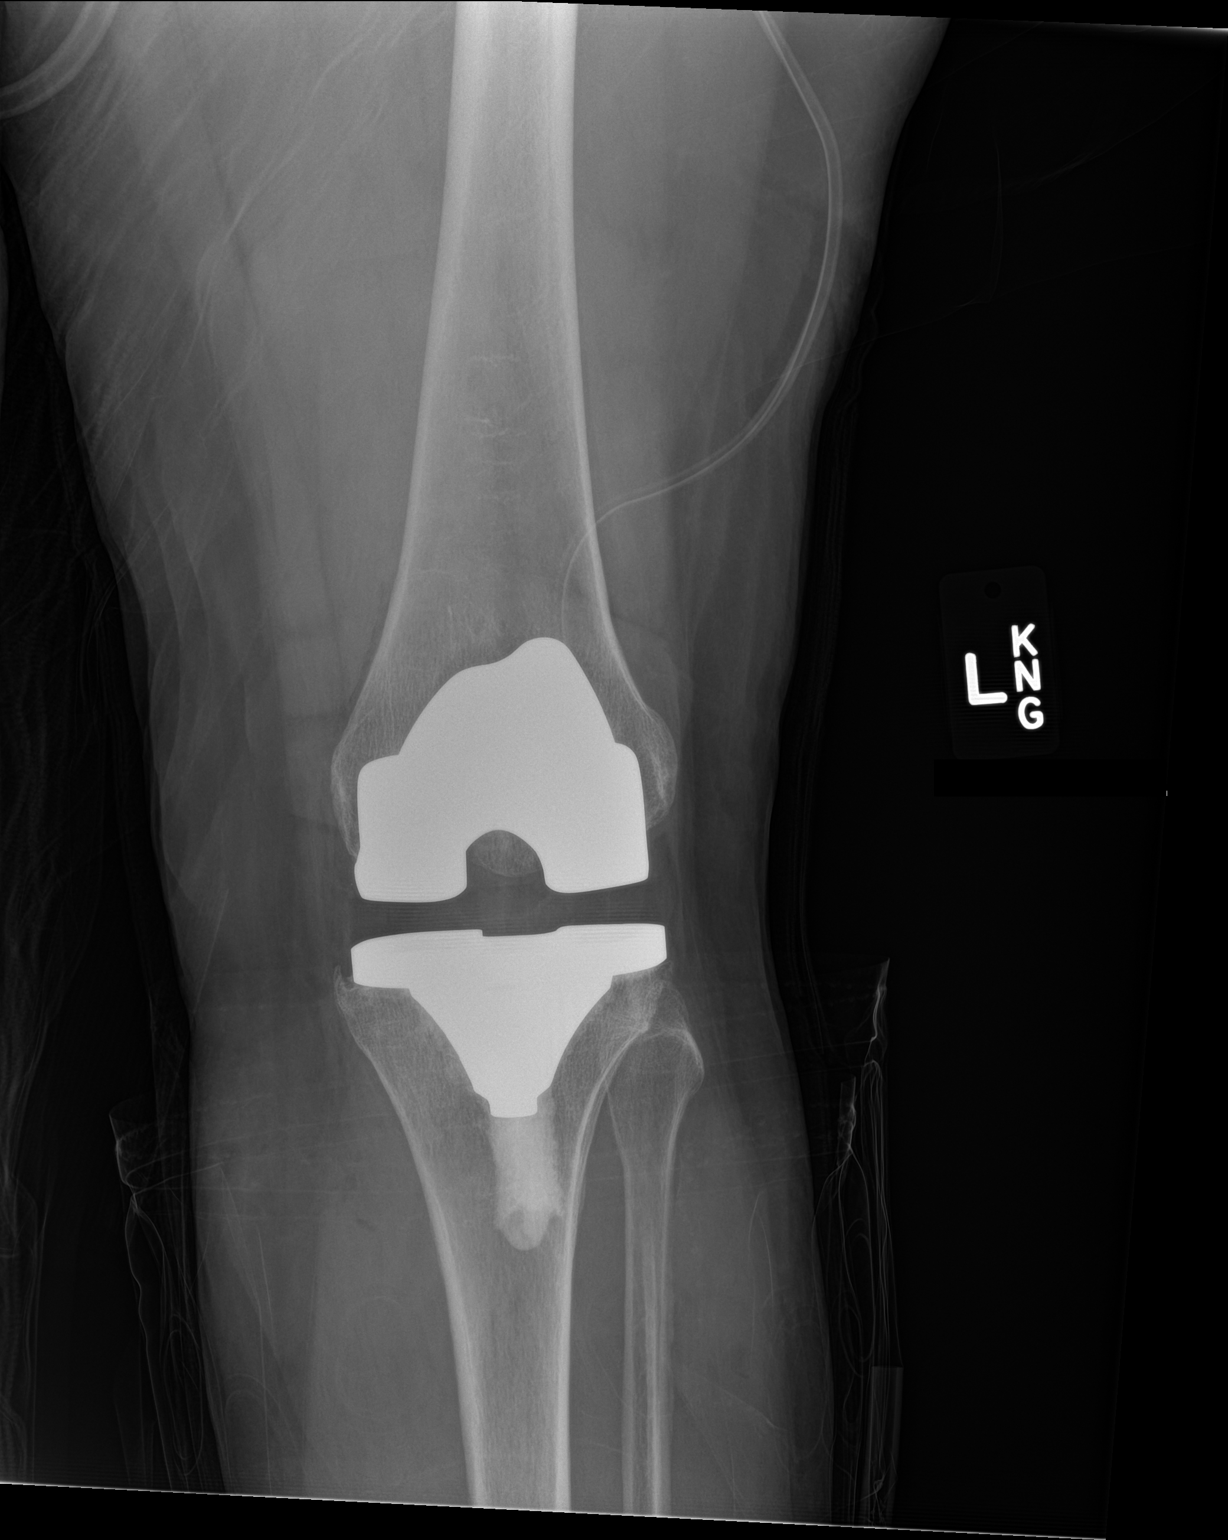

[knee lat]
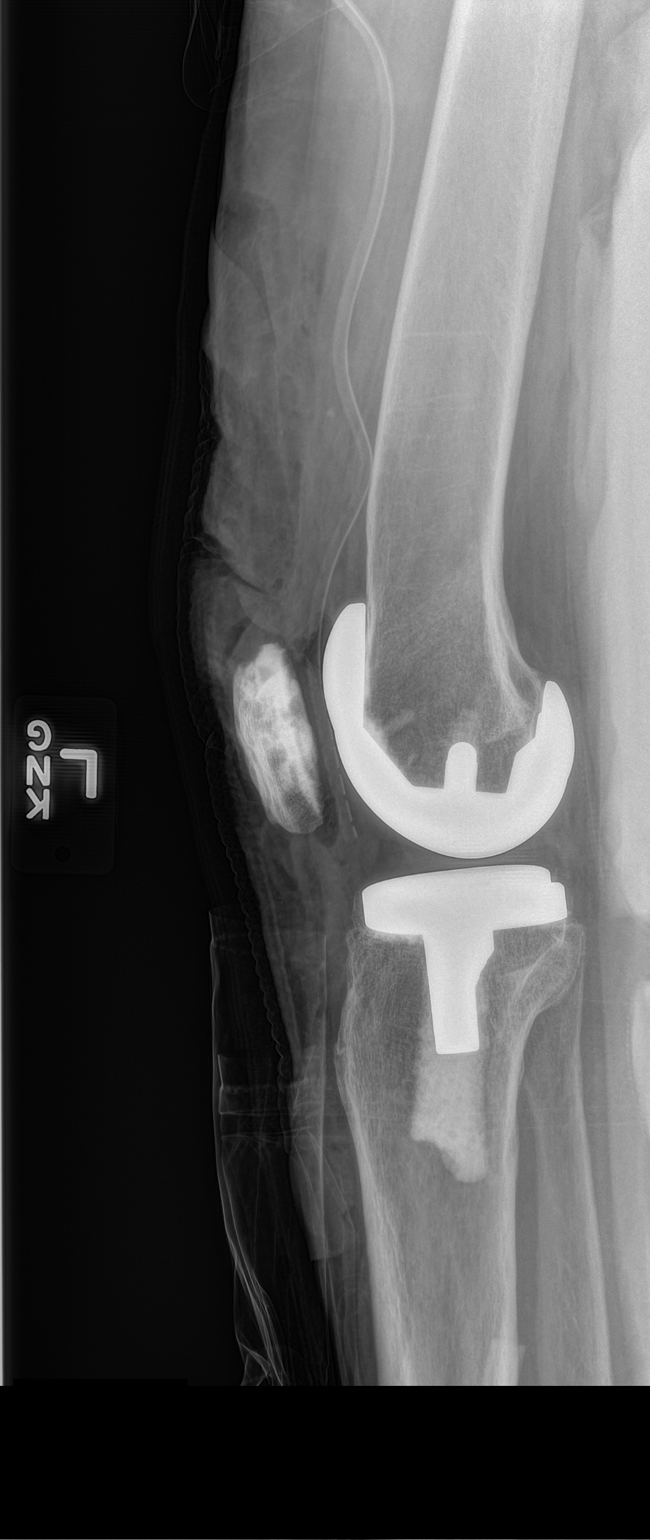

[2 of 2 positions shown; findings below may reference images not displayed]

FINDINGS: Soft tissue drain in place. The components of the total knee
prosthesis appear in good position. No fractures.
IMPRESSION: Satisfactory postoperative appearance of the left knee after
revision of the patellar component of the total knee prosthesis.

## 2016-11-13 ENCOUNTER — Ambulatory Visit: Payer: Medicare Other | Admitting: Family Medicine

## 2016-11-22 ENCOUNTER — Ambulatory Visit: Payer: Medicare Other | Admitting: Unknown Physician Specialty

## 2016-11-27 ENCOUNTER — Other Ambulatory Visit: Payer: Self-pay | Admitting: Physical Medicine and Rehabilitation

## 2016-12-08 ENCOUNTER — Other Ambulatory Visit: Payer: Self-pay | Admitting: Physical Medicine and Rehabilitation

## 2016-12-08 DIAGNOSIS — M961 Postlaminectomy syndrome, not elsewhere classified: Secondary | ICD-10-CM

## 2016-12-18 ENCOUNTER — Ambulatory Visit (INDEPENDENT_AMBULATORY_CARE_PROVIDER_SITE_OTHER): Payer: Medicare Other | Admitting: Unknown Physician Specialty

## 2016-12-18 ENCOUNTER — Encounter: Payer: Self-pay | Admitting: Unknown Physician Specialty

## 2016-12-18 VITALS — BP 130/72 | HR 72 | Temp 97.9°F | Wt 180.8 lb

## 2016-12-18 DIAGNOSIS — N281 Cyst of kidney, acquired: Secondary | ICD-10-CM | POA: Diagnosis not present

## 2016-12-18 DIAGNOSIS — I10 Essential (primary) hypertension: Secondary | ICD-10-CM | POA: Diagnosis not present

## 2016-12-18 DIAGNOSIS — Z01818 Encounter for other preprocedural examination: Secondary | ICD-10-CM | POA: Diagnosis not present

## 2016-12-18 NOTE — Progress Notes (Signed)
BP 130/72 (BP Location: Left Arm, Patient Position: Sitting, Cuff Size: Large)   Pulse 72   Temp 97.9 F (36.6 C)   Wt 180 lb 12.8 oz (82 kg)   LMP  (LMP Unknown)   SpO2 98%   BMI 33.07 kg/m    Subjective:    Patient ID: Renee Bridges, female    DOB: May 26, 1946, 71 y.o.   MRN: [id number]  HPI: Renee Bridges is a 71 y.o. female  Chief Complaint  Patient presents with  . Follow-up    pt states she is here to see Elnita Maxwell before her MRI on 12/20/16  . fingernail    pt would like finger nail clipped    Pt is here in order to get pre-surgical approval for a spinal cord stimulator insertion.  They are planning general anesthesia.She got great improvement in her leg symptoms following a temporary stimulator.  They are now planning a permanent stimulator. She has no complaints of SOB or chest pain when walking up 2 flights of stairs.      Hypertension Using medications without difficulty Average home BPs   No problems or lightheadedness No chest pain with exertion or shortness of breath No Edema  Renal cyst Of concern by back doctor.  I reviewed notes from Dr Sherryl Barters who feels after a complete work-up, this is an incidental finding.      Past Medical History:  Diagnosis Date  . Anxiety   . Arthritis   . Asthma   . Chronic kidney disease    cyst on kidney  . Depression   . Headache   . History of hiatal hernia   . Seizures (HCC)    hx. as child  . Shortness of breath dyspnea    with excertion     Relevant past medical, surgical, family and social history reviewed and updated as indicated. Interim medical history since our last visit reviewed. Allergies and medications reviewed and updated.  Review of Systems  Constitutional: Negative.   HENT: Negative.   Eyes: Negative.   Respiratory: Negative.   Cardiovascular: Negative.   Gastrointestinal: Negative.   Endocrine: Negative.   Genitourinary: Negative.   Musculoskeletal: Negative.   Skin: Negative.     Allergic/Immunologic: Negative.   Neurological: Negative.   Hematological: Negative.   Psychiatric/Behavioral: Negative.     Per HPI unless specifically indicated above     Objective:    BP 130/72 (BP Location: Left Arm, Patient Position: Sitting, Cuff Size: Large)   Pulse 72   Temp 97.9 F (36.6 C)   Wt 180 lb 12.8 oz (82 kg)   LMP  (LMP Unknown)   SpO2 98%   BMI 33.07 kg/m   Wt Readings from Last 3 Encounters:  12/18/16 180 lb 12.8 oz (82 kg)  10/04/16 182 lb 9.6 oz (82.8 kg)  09/25/16 179 lb 6.4 oz (81.4 kg)    Physical Exam  Constitutional: She is oriented to person, place, and time. She appears well-developed and well-nourished. No distress.  HENT:  Head: Normocephalic and atraumatic.  Eyes: Conjunctivae and lids are normal. Right eye exhibits no discharge. Left eye exhibits no discharge. No scleral icterus.  Neck: Normal range of motion. Neck supple. No JVD present. Carotid bruit is not present.  Cardiovascular: Normal rate, regular rhythm and normal heart sounds.   Pulmonary/Chest: Effort normal and breath sounds normal.  Abdominal: Normal appearance. There is no splenomegaly or hepatomegaly.  Musculoskeletal: Normal range of motion.  Neurological: She is alert and  oriented to person, place, and time.  Skin: Skin is warm, dry and intact. No rash noted. No pallor.  Psychiatric: She has a normal mood and affect. Her behavior is normal. Judgment and thought content normal.   EKG shows normal sinus rhythm.  Lo voltage precordial leads no change compared to one in 2016  Results for orders placed or performed in visit on 10/04/16  Microscopic Examination  Result Value Ref Range   WBC, UA 0-5 0 - 5 /hpf   RBC, UA 11-30 (A) 0 - 2 /hpf   Epithelial Cells (non renal) 0-10 0 - 10 /hpf   Bacteria, UA None seen None seen/Few  Urinalysis, Complete  Result Value Ref Range   Specific Gravity, UA 1.015 1.005 - 1.030   pH, UA 7.0 5.0 - 7.5   Color, UA Yellow Yellow    Appearance Ur Clear Clear   Leukocytes, UA Trace (A) Negative   Protein, UA Negative Negative/Trace   Glucose, UA Negative Negative   Ketones, UA Negative Negative   RBC, UA 2+ (A) Negative   Bilirubin, UA Negative Negative   Urobilinogen, Ur 0.2 0.2 - 1.0 mg/dL   Nitrite, UA Negative Negative   Microscopic Examination See below:       Assessment & Plan:   Problem List Items Addressed This Visit      Unprioritized   Cyst of right kidney    Reviewed notes from Urology.  Cyst is an incidental finding.  Complete work-up done 09/2016       Essential hypertension - Primary    Stable, continue present medications.        Relevant Orders   Comprehensive metabolic panel   CBC with Differential/Platelet   Lipid Panel w/o Chol/HDL Ratio    Other Visit Diagnoses    Pre-operative clearance       OK for surgery   Relevant Orders   EKG 12-Lead (Completed)   Comprehensive metabolic panel   CBC with Differential/Platelet       Follow up plan: Return for needs f/u physical.

## 2016-12-18 NOTE — Assessment & Plan Note (Signed)
Stable, continue present medications.   

## 2016-12-18 NOTE — Assessment & Plan Note (Signed)
Reviewed notes from Urology.  Cyst is an incidental finding.  Complete work-up done 09/2016

## 2016-12-19 ENCOUNTER — Encounter: Payer: Self-pay | Admitting: Unknown Physician Specialty

## 2016-12-19 LAB — COMPREHENSIVE METABOLIC PANEL
A/G RATIO: 1.4 (ref 1.2–2.2)
ALT: 19 IU/L (ref 0–32)
AST: 22 IU/L (ref 0–40)
Albumin: 4.1 g/dL (ref 3.5–4.8)
Alkaline Phosphatase: 75 IU/L (ref 39–117)
BUN / CREAT RATIO: 16 (ref 12–28)
BUN: 16 mg/dL (ref 8–27)
Bilirubin Total: 0.3 mg/dL (ref 0.0–1.2)
CALCIUM: 9.4 mg/dL (ref 8.7–10.3)
CO2: 24 mmol/L (ref 18–29)
Chloride: 100 mmol/L (ref 96–106)
Creatinine, Ser: 0.97 mg/dL (ref 0.57–1.00)
GFR, EST AFRICAN AMERICAN: 67 mL/min/{1.73_m2} (ref >59)
GFR, EST NON AFRICAN AMERICAN: 59 mL/min/{1.73_m2} — AB (ref >59)
GLOBULIN, TOTAL: 2.9 g/dL (ref 1.5–4.5)
Glucose: 76 mg/dL (ref 65–99)
POTASSIUM: 4.8 mmol/L (ref 3.5–5.2)
SODIUM: 141 mmol/L (ref 134–144)
TOTAL PROTEIN: 7 g/dL (ref 6.0–8.5)

## 2016-12-19 LAB — CBC WITH DIFFERENTIAL/PLATELET
BASOS: 0 %
Basophils Absolute: 0 10*3/uL (ref 0.0–0.2)
EOS (ABSOLUTE): 0.2 10*3/uL (ref 0.0–0.4)
Eos: 2 %
Hematocrit: 44.8 % (ref 34.0–46.6)
Hemoglobin: 15.3 g/dL (ref 11.1–15.9)
IMMATURE GRANS (ABS): 0 10*3/uL (ref 0.0–0.1)
Immature Granulocytes: 0 %
Lymphocytes Absolute: 3.7 10*3/uL — ABNORMAL HIGH (ref 0.7–3.1)
Lymphs: 36 %
MCH: 32.2 pg (ref 26.6–33.0)
MCHC: 34.2 g/dL (ref 31.5–35.7)
MCV: 94 fL (ref 79–97)
MONOS ABS: 0.9 10*3/uL (ref 0.1–0.9)
Monocytes: 9 %
NEUTROS ABS: 5.4 10*3/uL (ref 1.4–7.0)
Neutrophils: 53 %
PLATELETS: 235 10*3/uL (ref 150–379)
RBC: 4.75 x10E6/uL (ref 3.77–5.28)
RDW: 12.8 % (ref 12.3–15.4)
WBC: 10.3 10*3/uL (ref 3.4–10.8)

## 2016-12-19 LAB — LIPID PANEL W/O CHOL/HDL RATIO
Cholesterol, Total: 233 mg/dL — ABNORMAL HIGH (ref 100–199)
HDL: 41 mg/dL (ref >39)
LDL Calculated: 136 mg/dL — ABNORMAL HIGH (ref 0–99)
Triglycerides: 280 mg/dL — ABNORMAL HIGH (ref 0–149)
VLDL CHOLESTEROL CAL: 56 mg/dL — AB (ref 5–40)

## 2016-12-20 ENCOUNTER — Ambulatory Visit
Admission: RE | Admit: 2016-12-20 | Discharge: 2016-12-20 | Disposition: A | Discharge status: Home / Self Care | Payer: Medicare Other | Source: Ambulatory Visit | Attending: Physical Medicine and Rehabilitation | Admitting: Physical Medicine and Rehabilitation

## 2016-12-20 DIAGNOSIS — M961 Postlaminectomy syndrome, not elsewhere classified: Secondary | ICD-10-CM

## 2016-12-26 ENCOUNTER — Ambulatory Visit: Payer: Self-pay | Admitting: Physician Assistant

## 2017-01-10 ENCOUNTER — Encounter (HOSPITAL_COMMUNITY)
Admission: RE | Admit: 2017-01-10 | Discharge: 2017-01-10 | Disposition: A | Discharge status: Home / Self Care | Payer: Medicare Other | Source: Ambulatory Visit | Attending: Orthopedic Surgery | Admitting: Orthopedic Surgery

## 2017-01-10 ENCOUNTER — Encounter (HOSPITAL_COMMUNITY): Payer: Self-pay

## 2017-01-10 DIAGNOSIS — J45909 Unspecified asthma, uncomplicated: Secondary | ICD-10-CM | POA: Insufficient documentation

## 2017-01-10 DIAGNOSIS — Z01812 Encounter for preprocedural laboratory examination: Secondary | ICD-10-CM | POA: Insufficient documentation

## 2017-01-10 DIAGNOSIS — I10 Essential (primary) hypertension: Secondary | ICD-10-CM | POA: Insufficient documentation

## 2017-01-10 DIAGNOSIS — G894 Chronic pain syndrome: Secondary | ICD-10-CM | POA: Insufficient documentation

## 2017-01-10 HISTORY — DX: Essential (primary) hypertension: I10

## 2017-01-10 HISTORY — DX: Cyst of kidney, acquired: N28.1

## 2017-01-10 LAB — CBC
HCT: 49.1 % — ABNORMAL HIGH (ref 36.0–46.0)
HEMOGLOBIN: 15.8 g/dL — AB (ref 12.0–15.0)
MCH: 31.2 pg (ref 26.0–34.0)
MCHC: 32.2 g/dL (ref 30.0–36.0)
MCV: 96.8 fL (ref 78.0–100.0)
Platelets: 211 10*3/uL (ref 150–400)
RBC: 5.07 MIL/uL (ref 3.87–5.11)
RDW: 12.9 % (ref 11.5–15.5)
WBC: 7.6 10*3/uL (ref 4.0–10.5)

## 2017-01-10 LAB — BASIC METABOLIC PANEL
Anion gap: 9 (ref 5–15)
BUN: 10 mg/dL (ref 6–20)
CHLORIDE: 103 mmol/L (ref 101–111)
CO2: 27 mmol/L (ref 22–32)
Calcium: 9.7 mg/dL (ref 8.9–10.3)
Creatinine, Ser: 1.13 mg/dL — ABNORMAL HIGH (ref 0.44–1.00)
GFR calc Af Amer: 55 mL/min — ABNORMAL LOW (ref >60)
GFR calc non Af Amer: 47 mL/min — ABNORMAL LOW (ref >60)
GLUCOSE: 100 mg/dL — AB (ref 65–99)
POTASSIUM: 4.4 mmol/L (ref 3.5–5.1)
Sodium: 139 mmol/L (ref 135–145)

## 2017-01-10 LAB — SURGICAL PCR SCREEN
MRSA, PCR: NEGATIVE
Staphylococcus aureus: NEGATIVE

## 2017-01-10 NOTE — Pre-Procedure Instructions (Addendum)
Renee MannDella S Bridges  01/10/2017      [facility] [place] - [address] [place] [address] Phone: [phone] Fax: [phone]    Your procedure is scheduled on 01/17/17  [facility].M[facility]  Call this number if you have problems the morning of surgery:  [phone]   Remember:  Do not eat food or drink liquids after midnight.  Take these medicines the morning of surgery with A SIP OF WATER       Allegra and zanaflex if needed  STOP all herbel meds, nsaids (aleve,naproxen,advil,ibuprofen)  prior to surgery starting today 01/10/17 including all vitamins/supplements,aspirin   Do not wear jewelry, make-up or nail polish.  Do not wear lotions, powders, or perfumes, or deoderant.  Do not shave 48 hours prior to surgery.  Men may shave face and neck.  Do not bring valuables to the hospital.  [facility] is not responsible for any belongings or valuables.  Contacts, dentures or bridgework may not be worn into surgery.  Leave your suitcase in the car.  After surgery it may be brought to your room.  For patients admitted to the hospital, discharge time will be determined by your treatment team.  Patients discharged the day of surgery will not be allowed to drive home.   Special instructions:   Special Instructions: [facility] - Preparing for Surgery  Before surgery, you can play an important role.  Because skin is not sterile, your skin needs to be as free of germs as possible.  You can reduce the number of germs on you skin by washing with CHG (chlorahexidine gluconate) soap before surgery.  CHG is an antiseptic cleaner which kills germs and bonds with the skin to continue killing germs even after washing.  Please DO NOT use if you have an allergy to CHG or antibacterial soaps.  If your skin becomes reddened/irritated stop using the CHG and inform your nurse when you arrive at Short Stay.  Do not shave  (including legs and underarms) for at least 48 hours prior to the first CHG shower.  You may shave your face.  Please follow these instructions carefully:   1.  Shower with CHG Soap the night before surgery and the morning of Surgery.  2.  If you choose to wash your hair, wash your hair first as usual with your normal shampoo.  3.  After you shampoo, rinse your hair and body thoroughly to remove the Shampoo.  4.  Use CHG as you would any other liquid soap.  You can apply chg directly  to the skin and wash gently with scrungie or a clean washcloth.  5.  Apply the CHG Soap to your body ONLY FROM THE NECK DOWN.  Do not use on open wounds or open sores.  Avoid contact with your eyes ears, mouth and genitals (private parts).  Wash genitals (private parts)       with your normal soap.  6.  Wash thoroughly, paying special attention to the area where your surgery will be performed.  7.  Thoroughly rinse your body with warm water from the neck down.  8.  DO NOT shower/wash with your normal soap after using and rinsing off the CHG Soap.  9.  Pat yourself dry with a clean towel.            10.  Wear clean pajamas.  11.  Place clean sheets on your bed the night of your first shower and do not sleep with pets.  Day of Surgery  Do not apply any lotions/deodorants the morning of surgery.  Please wear clean clothes to the hospital/surgery center.  Please read over the  fact sheets that you were given.

## 2017-01-11 ENCOUNTER — Encounter (HOSPITAL_COMMUNITY): Payer: Self-pay

## 2017-01-11 NOTE — Progress Notes (Addendum)
Anesthesia Chart Review:  Pt is a 71 year old female scheduled for lumbar spinal cord stimulator insertion on 01/17/2017 with Venita Lickahari Brooks, M.D.  - PCP is Gabriel Cirriheryl Wicker, NP  PMH includes: HTN, asthma, seizures (as a child). Never smoker. BMI 33. S/p L knee revision 05/13/15.   Medications include: HCTZ.  Preoperative labs reviewed.  EKG 12/18/16: Sinus  Rhythm. Low voltage in precordial leads.   If no changes, I anticipate pt can proceed with surgery as scheduled.   Rica Mastngela Karena Kinker, FNP-BC [facility] Phone: [phone] 01/11/2017 3:01 PM

## 2017-01-16 NOTE — Anesthesia Preprocedure Evaluation (Signed)
Anesthesia Evaluation  Patient identified by MRN, date of birth, ID band Patient awake    Reviewed: Allergy & Precautions, H&P , Patient's Chart, lab work & pertinent test results, reviewed documented beta blocker date and time   Airway Mallampati: II  TM Distance: >3 FB Neck ROM: full    Dental no notable dental hx.    Pulmonary asthma ,    Pulmonary exam normal breath sounds clear to auscultation       Cardiovascular hypertension, On Medications  Rhythm:regular Rate:Normal     Neuro/Psych    GI/Hepatic   Endo/Other    Renal/GU      Musculoskeletal   Abdominal   Peds  Hematology   Anesthesia Other Findings NSR; Low voltage Hb 15.8  Reproductive/Obstetrics                             Anesthesia Physical Anesthesia Plan  ASA: II  Anesthesia Plan: General   Post-op Pain Management:    Induction: Intravenous  Airway Management Planned: Oral ETT  Additional Equipment:   Intra-op Plan:   Post-operative Plan: Extubation in OR  Informed Consent: I have reviewed the patients History and Physical, chart, labs and discussed the procedure including the risks, benefits and alternatives for the proposed anesthesia with the patient or authorized representative who has indicated his/her understanding and acceptance.   Dental Advisory Given  Plan Discussed with: CRNA and Surgeon  Anesthesia Plan Comments: (  )        Anesthesia Quick Evaluation

## 2017-01-17 ENCOUNTER — Inpatient Hospital Stay (HOSPITAL_COMMUNITY)
Admission: AD | Admit: 2017-01-17 | Discharge: 2017-01-18 | DRG: 030 | Disposition: A | Discharge status: Home / Self Care | Payer: Medicare Other | Source: Ambulatory Visit | Attending: Orthopedic Surgery | Admitting: Orthopedic Surgery

## 2017-01-17 ENCOUNTER — Ambulatory Visit (HOSPITAL_COMMUNITY): Payer: Medicare Other | Admitting: Emergency Medicine

## 2017-01-17 ENCOUNTER — Ambulatory Visit (HOSPITAL_COMMUNITY): Payer: Medicare Other | Admitting: Anesthesiology

## 2017-01-17 ENCOUNTER — Encounter (HOSPITAL_COMMUNITY): Payer: Self-pay | Admitting: *Deleted

## 2017-01-17 ENCOUNTER — Ambulatory Visit (HOSPITAL_COMMUNITY): Payer: Medicare Other

## 2017-01-17 ENCOUNTER — Encounter (HOSPITAL_COMMUNITY)
Admission: AD | Disposition: A | Discharge status: Home / Self Care | Payer: Self-pay | Source: Ambulatory Visit | Attending: Orthopedic Surgery

## 2017-01-17 DIAGNOSIS — Z96652 Presence of left artificial knee joint: Secondary | ICD-10-CM | POA: Diagnosis present

## 2017-01-17 DIAGNOSIS — I10 Essential (primary) hypertension: Secondary | ICD-10-CM | POA: Diagnosis present

## 2017-01-17 DIAGNOSIS — G894 Chronic pain syndrome: Secondary | ICD-10-CM | POA: Diagnosis present

## 2017-01-17 DIAGNOSIS — M47816 Spondylosis without myelopathy or radiculopathy, lumbar region: Secondary | ICD-10-CM | POA: Diagnosis present

## 2017-01-17 DIAGNOSIS — M549 Dorsalgia, unspecified: Secondary | ICD-10-CM | POA: Diagnosis present

## 2017-01-17 DIAGNOSIS — M25552 Pain in left hip: Secondary | ICD-10-CM | POA: Diagnosis present

## 2017-01-17 DIAGNOSIS — M961 Postlaminectomy syndrome, not elsewhere classified: Secondary | ICD-10-CM | POA: Diagnosis present

## 2017-01-17 DIAGNOSIS — M76891 Other specified enthesopathies of right lower limb, excluding foot: Secondary | ICD-10-CM | POA: Diagnosis present

## 2017-01-17 DIAGNOSIS — M545 Low back pain: Secondary | ICD-10-CM | POA: Diagnosis present

## 2017-01-17 DIAGNOSIS — Z419 Encounter for procedure for purposes other than remedying health state, unspecified: Secondary | ICD-10-CM

## 2017-01-17 DIAGNOSIS — M461 Sacroiliitis, not elsewhere classified: Secondary | ICD-10-CM | POA: Diagnosis present

## 2017-01-17 DIAGNOSIS — J45909 Unspecified asthma, uncomplicated: Secondary | ICD-10-CM | POA: Diagnosis present

## 2017-01-17 DIAGNOSIS — M79672 Pain in left foot: Secondary | ICD-10-CM | POA: Diagnosis present

## 2017-01-17 DIAGNOSIS — M25572 Pain in left ankle and joints of left foot: Secondary | ICD-10-CM | POA: Diagnosis present

## 2017-01-17 DIAGNOSIS — M5136 Other intervertebral disc degeneration, lumbar region: Secondary | ICD-10-CM | POA: Diagnosis present

## 2017-01-17 HISTORY — PX: SPINAL CORD STIMULATOR INSERTION: SHX5378

## 2017-01-17 SURGERY — INSERTION, SPINAL CORD STIMULATOR, LUMBAR
Anesthesia: General | Site: Back

## 2017-01-17 MED ORDER — ACETAMINOPHEN 10 MG/ML IV SOLN
INTRAVENOUS | Status: AC
  Filled 2017-01-17: qty 100

## 2017-01-17 MED ORDER — DULOXETINE HCL 60 MG PO CPEP: 60.0000 mg | ORAL_CAPSULE | Freq: Every day | ORAL | Status: DC

## 2017-01-17 MED ORDER — SODIUM CHLORIDE 0.9% FLUSH: 3.0000 mL | INTRAVENOUS | Status: DC | PRN

## 2017-01-17 MED ORDER — MORPHINE SULFATE (PF) 4 MG/ML IV SOLN: 2.0000 mg | INTRAVENOUS | Status: DC | PRN

## 2017-01-17 MED ORDER — POLYETHYLENE GLYCOL 3350 17 G PO PACK: 17.0000 g | PACK | Freq: Every day | ORAL | Status: DC | PRN

## 2017-01-17 MED ORDER — DEXAMETHASONE 4 MG PO TABS
4.0000 mg | ORAL_TABLET | Freq: Four times a day (QID) | ORAL | Status: AC
  Administered 2017-01-17 (×2): 4 mg via ORAL
  Filled 2017-01-17 (×2): qty 1

## 2017-01-17 MED ORDER — FENTANYL CITRATE (PF) 100 MCG/2ML IJ SOLN
25.0000 ug | INTRAMUSCULAR | Status: DC | PRN
  Administered 2017-01-17 (×2): 25 ug via INTRAVENOUS
  Administered 2017-01-17: 50 ug via INTRAVENOUS

## 2017-01-17 MED ORDER — ONDANSETRON HCL 4 MG PO TABS
4.0000 mg | ORAL_TABLET | Freq: Four times a day (QID) | ORAL | Status: DC | PRN
  Filled 2017-01-17: qty 1

## 2017-01-17 MED ORDER — LIDOCAINE HCL (CARDIAC) 20 MG/ML IV SOLN
INTRAVENOUS | Status: DC | PRN
  Administered 2017-01-17: 100 mg via INTRAVENOUS

## 2017-01-17 MED ORDER — METHOCARBAMOL 500 MG PO TABS: 500.0000 mg | ORAL_TABLET | Freq: Three times a day (TID) | ORAL | 0 refills | Status: DC | PRN

## 2017-01-17 MED ORDER — PHENYLEPHRINE HCL 10 MG/ML IJ SOLN
INTRAMUSCULAR | Status: DC | PRN
  Administered 2017-01-17 (×2): 40 ug via INTRAVENOUS
  Administered 2017-01-17: 80 ug via INTRAVENOUS
  Administered 2017-01-17 (×3): 40 ug via INTRAVENOUS

## 2017-01-17 MED ORDER — CEFAZOLIN SODIUM-DEXTROSE 2-4 GM/100ML-% IV SOLN
2.0000 g | INTRAVENOUS | Status: AC
  Administered 2017-01-17: 2 g via INTRAVENOUS

## 2017-01-17 MED ORDER — ACETAMINOPHEN 650 MG RE SUPP: 650.0000 mg | RECTAL | Status: DC | PRN

## 2017-01-17 MED ORDER — MIDAZOLAM HCL 2 MG/2ML IJ SOLN
INTRAMUSCULAR | Status: AC
  Filled 2017-01-17: qty 2

## 2017-01-17 MED ORDER — BUPIVACAINE-EPINEPHRINE 0.25% -1:200000 IJ SOLN
INTRAMUSCULAR | Status: DC | PRN
  Administered 2017-01-17: 10 mL

## 2017-01-17 MED ORDER — METHOCARBAMOL 500 MG PO TABS
500.0000 mg | ORAL_TABLET | Freq: Four times a day (QID) | ORAL | Status: DC | PRN
  Administered 2017-01-17 – 2017-01-18 (×2): 500 mg via ORAL
  Filled 2017-01-17 (×2): qty 1

## 2017-01-17 MED ORDER — ACETAMINOPHEN 325 MG PO TABS: 650.0000 mg | ORAL_TABLET | ORAL | Status: DC | PRN

## 2017-01-17 MED ORDER — MIDAZOLAM HCL 5 MG/5ML IJ SOLN
INTRAMUSCULAR | Status: DC | PRN
  Administered 2017-01-17: 1 mg via INTRAVENOUS

## 2017-01-17 MED ORDER — CEFAZOLIN SODIUM-DEXTROSE 2-4 GM/100ML-% IV SOLN
2.0000 g | Freq: Three times a day (TID) | INTRAVENOUS | Status: AC
  Administered 2017-01-17 (×2): 2 g via INTRAVENOUS
  Filled 2017-01-17 (×2): qty 100

## 2017-01-17 MED ORDER — ONDANSETRON HCL 4 MG PO TABS: 4.0000 mg | ORAL_TABLET | Freq: Three times a day (TID) | ORAL | 0 refills | Status: DC | PRN

## 2017-01-17 MED ORDER — DEXAMETHASONE SODIUM PHOSPHATE 10 MG/ML IJ SOLN
INTRAMUSCULAR | Status: DC | PRN
  Administered 2017-01-17: 10 mg via INTRAVENOUS

## 2017-01-17 MED ORDER — MORPHINE SULFATE (PF) 2 MG/ML IV SOLN: 2.0000 mg | INTRAVENOUS | Status: DC | PRN

## 2017-01-17 MED ORDER — SODIUM CHLORIDE 0.9% FLUSH
3.0000 mL | Freq: Two times a day (BID) | INTRAVENOUS | Status: DC
  Administered 2017-01-17 (×2): 3 mL via INTRAVENOUS

## 2017-01-17 MED ORDER — ARTIFICIAL TEARS OPHTHALMIC OINT
TOPICAL_OINTMENT | OPHTHALMIC | Status: DC | PRN
  Administered 2017-01-17: 1 via OPHTHALMIC

## 2017-01-17 MED ORDER — LACTATED RINGERS IV SOLN
INTRAVENOUS | Status: DC | PRN
  Administered 2017-01-17 (×2): via INTRAVENOUS

## 2017-01-17 MED ORDER — FENTANYL CITRATE (PF) 250 MCG/5ML IJ SOLN
INTRAMUSCULAR | Status: AC
  Filled 2017-01-17: qty 5

## 2017-01-17 MED ORDER — FENTANYL CITRATE (PF) 100 MCG/2ML IJ SOLN
INTRAMUSCULAR | Status: DC | PRN
  Administered 2017-01-17 (×3): 50 ug via INTRAVENOUS

## 2017-01-17 MED ORDER — METHOCARBAMOL 1000 MG/10ML IJ SOLN
500.0000 mg | Freq: Four times a day (QID) | INTRAVENOUS | Status: DC | PRN
  Filled 2017-01-17: qty 5

## 2017-01-17 MED ORDER — OXYCODONE-ACETAMINOPHEN 10-325 MG PO TABS: 1.0000 | ORAL_TABLET | ORAL | 0 refills | Status: DC | PRN

## 2017-01-17 MED ORDER — ONDANSETRON HCL 4 MG/2ML IJ SOLN
INTRAMUSCULAR | Status: DC | PRN
  Administered 2017-01-17: 4 mg via INTRAVENOUS

## 2017-01-17 MED ORDER — PROPOFOL 10 MG/ML IV BOLUS
INTRAVENOUS | Status: DC | PRN
  Administered 2017-01-17: 130 mg via INTRAVENOUS

## 2017-01-17 MED ORDER — OXYCODONE HCL 5 MG PO TABS
ORAL_TABLET | ORAL | Status: AC
  Administered 2017-01-17: 10 mg via ORAL
  Filled 2017-01-17: qty 2

## 2017-01-17 MED ORDER — FENTANYL CITRATE (PF) 100 MCG/2ML IJ SOLN
INTRAMUSCULAR | Status: AC
  Administered 2017-01-17: 50 ug via INTRAVENOUS
  Filled 2017-01-17: qty 2

## 2017-01-17 MED ORDER — EPINEPHRINE PF 1 MG/ML IJ SOLN
INTRAMUSCULAR | Status: AC
  Filled 2017-01-17: qty 1

## 2017-01-17 MED ORDER — ACETAMINOPHEN 10 MG/ML IV SOLN
INTRAVENOUS | Status: DC | PRN
  Administered 2017-01-17: 1000 mg via INTRAVENOUS

## 2017-01-17 MED ORDER — PHENOL 1.4 % MT LIQD: 1.0000 | OROMUCOSAL | Status: DC | PRN

## 2017-01-17 MED ORDER — OXYCODONE HCL 5 MG PO TABS
10.0000 mg | ORAL_TABLET | ORAL | Status: DC | PRN
  Administered 2017-01-17 – 2017-01-18 (×4): 10 mg via ORAL
  Filled 2017-01-17 (×3): qty 2

## 2017-01-17 MED ORDER — THROMBIN 20000 UNITS EX SOLR
CUTANEOUS | Status: AC
  Filled 2017-01-17: qty 20000

## 2017-01-17 MED ORDER — ONDANSETRON HCL 4 MG/2ML IJ SOLN
4.0000 mg | Freq: Four times a day (QID) | INTRAMUSCULAR | Status: DC | PRN
  Administered 2017-01-17 – 2017-01-18 (×2): 4 mg via INTRAVENOUS
  Filled 2017-01-17 (×2): qty 2

## 2017-01-17 MED ORDER — LACTATED RINGERS IV SOLN: INTRAVENOUS | Status: DC

## 2017-01-17 MED ORDER — BUPIVACAINE HCL (PF) 0.25 % IJ SOLN
INTRAMUSCULAR | Status: AC
  Filled 2017-01-17: qty 30

## 2017-01-17 MED ORDER — ROCURONIUM BROMIDE 100 MG/10ML IV SOLN
INTRAVENOUS | Status: DC | PRN
  Administered 2017-01-17: 5 mg via INTRAVENOUS
  Administered 2017-01-17: 40 mg via INTRAVENOUS
  Administered 2017-01-17: 10 mg via INTRAVENOUS

## 2017-01-17 MED ORDER — MEPERIDINE HCL 25 MG/ML IJ SOLN: 6.2500 mg | INTRAMUSCULAR | Status: DC | PRN

## 2017-01-17 MED ORDER — HYDROCHLOROTHIAZIDE 25 MG PO TABS
25.0000 mg | ORAL_TABLET | Freq: Every day | ORAL | Status: DC
  Administered 2017-01-18: 25 mg via ORAL
  Filled 2017-01-17: qty 1

## 2017-01-17 MED ORDER — CEFAZOLIN SODIUM-DEXTROSE 2-4 GM/100ML-% IV SOLN
INTRAVENOUS | Status: AC
  Filled 2017-01-17: qty 100

## 2017-01-17 MED ORDER — DEXAMETHASONE SODIUM PHOSPHATE 4 MG/ML IJ SOLN: 4.0000 mg | Freq: Four times a day (QID) | INTRAMUSCULAR | Status: AC

## 2017-01-17 MED ORDER — PROPOFOL 10 MG/ML IV BOLUS
INTRAVENOUS | Status: AC
  Filled 2017-01-17: qty 40

## 2017-01-17 MED ORDER — THROMBIN 20000 UNITS EX SOLR
OROMUCOSAL | Status: DC | PRN
  Administered 2017-01-17: 09:00:00 via TOPICAL

## 2017-01-17 MED ORDER — ONDANSETRON HCL 4 MG/2ML IJ SOLN: 4.0000 mg | Freq: Once | INTRAMUSCULAR | Status: DC | PRN

## 2017-01-17 MED ORDER — MENTHOL 3 MG MT LOZG: 1.0000 | LOZENGE | OROMUCOSAL | Status: DC | PRN

## 2017-01-17 MED ORDER — SUGAMMADEX SODIUM 200 MG/2ML IV SOLN
INTRAVENOUS | Status: DC | PRN
  Administered 2017-01-17: 160 mg via INTRAVENOUS

## 2017-01-17 MED ORDER — ALBUTEROL SULFATE HFA 108 (90 BASE) MCG/ACT IN AERS
INHALATION_SPRAY | RESPIRATORY_TRACT | Status: DC | PRN
  Administered 2017-01-17: 3 via RESPIRATORY_TRACT

## 2017-01-17 SURGICAL SUPPLY — 56 items
CANISTER SUCT 3000ML PPV (MISCELLANEOUS) ×3 IMPLANT
CLOSURE STERI-STRIP 1/2X4 (GAUZE/BANDAGES/DRESSINGS) ×1
CLSR STERI-STRIP ANTIMIC 1/2X4 (GAUZE/BANDAGES/DRESSINGS) ×2 IMPLANT
CONTROLLER PROCLAIM IPG E5 PAT (Neuro Prosthesis/Implant) ×2 IMPLANT
COVER PROBE W GEL 5X96 (DRAPES) ×2 IMPLANT
COVER SURGICAL LIGHT HANDLE (MISCELLANEOUS) ×3 IMPLANT
DRAPE C-ARM 42X72 X-RAY (DRAPES) ×3 IMPLANT
DRAPE INCISE IOBAN 85X60 (DRAPES) ×3 IMPLANT
DRAPE SURG 17X23 STRL (DRAPES) ×3 IMPLANT
DRAPE U-SHAPE 47X51 STRL (DRAPES) ×3 IMPLANT
DRSG AQUACEL AG ADV 3.5X 4 (GAUZE/BANDAGES/DRESSINGS) ×2 IMPLANT
DRSG AQUACEL AG ADV 3.5X 6 (GAUZE/BANDAGES/DRESSINGS) ×6 IMPLANT
DURAPREP 26ML APPLICATOR (WOUND CARE) ×3 IMPLANT
ELECT BLADE 4.0 EZ CLEAN MEGAD (MISCELLANEOUS) ×3
ELECT CAUTERY BLADE 6.4 (BLADE) ×3 IMPLANT
ELECT PENCIL ROCKER SW 15FT (MISCELLANEOUS) ×3 IMPLANT
ELECT REM PT RETURN 9FT ADLT (ELECTROSURGICAL) ×3
ELECTRODE BLDE 4.0 EZ CLN MEGD (MISCELLANEOUS) ×1 IMPLANT
ELECTRODE REM PT RTRN 9FT ADLT (ELECTROSURGICAL) ×1 IMPLANT
GLOVE BIO SURGEON STRL SZ7 (GLOVE) ×3 IMPLANT
GLOVE BIOGEL PI IND STRL 7.0 (GLOVE) ×1 IMPLANT
GLOVE BIOGEL PI IND STRL 8.5 (GLOVE) ×1 IMPLANT
GLOVE BIOGEL PI INDICATOR 7.0 (GLOVE) ×2
GLOVE BIOGEL PI INDICATOR 8.5 (GLOVE) ×2
GLOVE SS N UNI LF 8.5 STRL (GLOVE) ×6 IMPLANT
GOWN STRL REUS W/ TWL LRG LVL3 (GOWN DISPOSABLE) ×2 IMPLANT
GOWN STRL REUS W/TWL 2XL LVL3 (GOWN DISPOSABLE) ×3 IMPLANT
GOWN STRL REUS W/TWL LRG LVL3 (GOWN DISPOSABLE) ×6
KIT BASIN OR (CUSTOM PROCEDURE TRAY) ×3 IMPLANT
KIT ROOM TURNOVER OR (KITS) ×3 IMPLANT
LAMI NARROW PRIPOLE 16CH (Neuro Prosthesis/Implant) ×2 IMPLANT
NDL SPNL 18GX3.5 QUINCKE PK (NEEDLE) ×3 IMPLANT
NEEDLE 22X1 1/2 (OR ONLY) (NEEDLE) ×3 IMPLANT
NEEDLE SPNL 18GX3.5 QUINCKE PK (NEEDLE) ×9 IMPLANT
NS IRRIG 1000ML POUR BTL (IV SOLUTION) ×3 IMPLANT
PACK LAMINECTOMY ORTHO (CUSTOM PROCEDURE TRAY) ×3 IMPLANT
PACK UNIVERSAL I (CUSTOM PROCEDURE TRAY) ×3 IMPLANT
PAD ARMBOARD 7.5X6 YLW CONV (MISCELLANEOUS) ×6 IMPLANT
SPATULA SILICONE BRAIN 10MM (MISCELLANEOUS) ×2 IMPLANT
SPONGE LAP 4X18 X RAY DECT (DISPOSABLE) ×2 IMPLANT
SPONGE SURGIFOAM ABS GEL 100 (HEMOSTASIS) ×3 IMPLANT
STAPLER VISISTAT 35W (STAPLE) ×3 IMPLANT
SURGIFLO W/THROMBIN 8M KIT (HEMOSTASIS) ×3 IMPLANT
SUT BONE WAX W31G (SUTURE) ×3 IMPLANT
SUT FIBERWIRE #2 38 T-5 BLUE (SUTURE) ×3
SUT MNCRL AB 3-0 PS2 18 (SUTURE) ×6 IMPLANT
SUT VIC AB 1 CT1 18XCR BRD 8 (SUTURE) ×2 IMPLANT
SUT VIC AB 1 CT1 8-18 (SUTURE) ×6
SUT VIC AB 2-0 CT1 18 (SUTURE) ×3 IMPLANT
SUTURE FIBERWR #2 38 T-5 BLUE (SUTURE) ×1 IMPLANT
SYR BULB IRRIGATION 50ML (SYRINGE) ×3 IMPLANT
SYR CONTROL 10ML LL (SYRINGE) ×3 IMPLANT
TOWEL OR 17X24 6PK STRL BLUE (TOWEL DISPOSABLE) ×3 IMPLANT
TOWEL OR 17X26 10 PK STRL BLUE (TOWEL DISPOSABLE) ×3 IMPLANT
TRAY FOLEY W/METER SILVER 16FR (SET/KITS/TRAYS/PACK) IMPLANT
WATER STERILE IRR 1000ML POUR (IV SOLUTION) ×3 IMPLANT

## 2017-01-17 NOTE — Brief Op Note (Signed)
01/17/2017  11:17 AM  PATIENT:  Renee Bridges  71 y.o. female  PRE-OPERATIVE DIAGNOSIS:  Chronic back pain with failed back syndrome  POST-OPERATIVE DIAGNOSIS:  Chronic back pain with failed back syndrome  PROCEDURE:  Procedure(s) with comments: LUMBAR SPINAL CORD STIMULATOR INSERTION (N/A) - Requests 3 hrs  SURGEON:  Surgeon(s) and Role:    Venita Lick* Julee Stoll, MD - Primary  PHYSICIAN ASSISTANT:   ASSISTANTS: Carmen Mayo   ANESTHESIA:   general  EBL:  Total I/O In: 1500 [I.V.:1500] Out: 50 [Blood:50]  BLOOD ADMINISTERED:none  DRAINS: none   LOCAL MEDICATIONS USED:  MARCAINE     SPECIMEN:  No Specimen  DISPOSITION OF SPECIMEN:  N/A  COUNTS:  YES  TOURNIQUET:  * No tourniquets in log *  DICTATION: .Other Dictation: Dictation Number [id number]  PLAN OF CARE: Admit for overnight observation  PATIENT DISPOSITION:  PACU - hemodynamically stable.

## 2017-01-17 NOTE — H&P (Signed)
History of Present Illness  The patient is a 71 year old female who presents for a follow-up for H & P. The patient is scheduled for a SCS placement to be performed by Dr. Debria Garretahari D. Shon BatonBrooks, [facility]-17-[facility] . Please see the hospital record for complete dictated history and physical. Pt has a hx of Asthma. She says the pollen will cause it to "act up." Otherwise she reports she has a hx of good health.  Problem List/Past Medical Aftercare following left knee joint replacement surgery (Z47.1)  Hamstring tendinitis of right thigh (W09.811(M76.891)  Chronic pain of left ankle (M25.572)  Degeneration of intervertebral disc of lumbar region (M51.36)  Chronic pain of right knee (M25.561)  Status post left knee replacement (B14.782(Z96.652)  Pain, foot, left, chronic (N56.213(M79.672)  Arthralgia of hip, left (M25.552)  Sacroiliitis (M46.1)  Chronic bilateral low back pain without sciatica (M54.5)  Spondylosis without myelopathy or radiculopathy, lumbar region (M47.816)  Lumbar post-laminectomy syndrome (M96.1)  Primary osteoarthritis of both knees (M17.0)  Pain due to total knee replacement, initial encounter (Y86.57QI(T84.84XA)  Failed total left knee replacement, subsequent encounter (O96.295M(T84.093D)  Non-Contributory Problem List/Past Medical History   Allergies Codeine Phosphate *ANALGESICS - OPIOID*  Allergies Reconciled   Family History First Degree Relatives  Cancer  Brother, Father, Mother, Sister.  Social History  Tobacco use  Never smoker. 01/07/2015 Tobacco / smoke exposure  01/07/2015: no Living situation  live alone Not under pain contract  Children  2 No history of drug/alcohol rehab  Marital status  divorced Current work status  retired Never consumed alcohol  01/07/2015: Never consumed alcohol Exercise  Exercises daily; does running / walking  Medication History a pill called Pain Reliver/rcy Active. DULoxetine HCl (60MG  Capsule DR Part, Oral)  Active. HydroCHLOROthiazide (25MG  Tablet, Oral) Active. Citalopram Hydrobromide (20MG  Tablet, Oral) Active. Medications Reconciled  Past Surgical History Spinal Surgery  Total Knee Replacement  left  Other Problems Asthma   Vitals 01/12/2017 9:38 AM Weight: 187 lb Height: 62in Body Surface Area: 1.86 m Body Mass Index: 34.2 kg/m  Temp.: 97.40F(Oral)  Pulse: 70 (Regular)  BP: 158/96 (Sitting, Left Arm, Standard)  General General Appearance-Not in acute distress. Orientation-Oriented X3. Build & Nutrition-Well nourished and Well developed.  Integumentary General Characteristics Surgical Scars - surgical scarring consistent with previous lumbar surgery and surgical scarring consistent with previous left knee surgery. Lumbar Spine-Skin examination of the lumbar spine is without deformity, skin lesions, lacerations or abrasions.  Abdomen Palpation/Percussion Palpation and Percussion of the abdomen reveal - Soft, Non Tender and No Rebound tenderness.  Peripheral Vascular Lower Extremity Palpation - Posterior tibial pulse - Bilateral - 2+. Dorsalis pedis pulse - Bilateral - 2+.  Neurologic Sensation Lower Extremity - Bilateral - sensation is intact in the lower extremity. Reflexes Patellar Reflex - Bilateral - 2+. Achilles Reflex - Bilateral - 2+. Clonus - Bilateral - clonus not present. Hoffman's Sign - Bilateral - Hoffman's sign not present. Testing Seated Straight Leg Raise - Bilateral - Seated straight leg raise negative.  Musculoskeletal Spine/Ribs/Pelvis  Lumbosacral Spine: Inspection and Palpation - Tenderness - left lumbar paraspinals tender to palpation and right lumbar paraspinals tender to palpation. Strength and Tone: Strength - Hip Flexion - Bilateral - 5/5. Knee Extension - Bilateral - 5/5. Knee Flexion - Bilateral - 5/5. Ankle Dorsiflexion - Bilateral - 5/5. Ankle Plantarflexion - Bilateral - 5/5. ROM - Flexion - moderately  decreased range of motion and painful. Extension - moderately decreased range of motion and  painful. Left Lateral Bending - moderately decreased range of motion and painful. Right Lateral Bending - moderately decreased range of motion and painful. Right Rotation - moderately decreased range of motion and painful. Left Rotation - moderately decreased range of motion and painful. Pain - neither flexion or extension is more painful than the other. Lumbosacral Spine - Waddell's Signs - no Waddell's signs present. Lower Extremity Range of Motion - No true hip, knee or ankle pain with range of motion. Gait and Station - [employer] - no assistive devices.  Thoracic MRI - no significant spinal cord stenosis/cord compression.  No contraindication to implantation   Assessment & Plan  Goal Of Surgery: Discussed that goal of surgery is to reduce pain and improve function and quality of life. Patient is aware that despite all appropriate treatment that there pain and function could be the same, worse, or different.  At this point in time given the success of the spinal cord stimulator trial, I think it is reasonable to move forward with the permanent implant. We have gone over the risks which include infection, bleeding, nerve damage, death, stroke, paralysis, blood clots, bleeding into the chest cavity, ongoing or worse pain, failure of the device, need for additional surgery. At this point, when I talked to her about whether or not she had the renal cyst looked at, she indicates that she is no longer going to her primary care physician. I printed out another copy of the MRI and a preop medical clearance form. I will need both of these completed and I need this preoperative medical clearance before we can move forward with surgery. In addition, she has a thoracic MRI scheduled for the 18 of this month. I will review that. As long as there is adequate space in the canal, then we will plan on moving forward with  surgery.

## 2017-01-17 NOTE — Anesthesia Procedure Notes (Signed)
Procedure Name: Intubation Date/Time: 01/17/2017 8:45 AM Performed by: Edmonia CaprioAUSTON, Adelaine Roppolo M Pre-anesthesia Checklist: Patient identified, Suction available, Emergency Drugs available, Patient being monitored and Timeout performed Patient Re-evaluated:Patient Re-evaluated prior to inductionOxygen Delivery Method: Circle system utilized Preoxygenation: Pre-oxygenation with 100% oxygen Intubation Type: IV induction Ventilation: Mask ventilation without difficulty Laryngoscope Size: Miller and 2 Grade View: Grade I Tube type: Oral Tube size: 7.0 mm Number of attempts: 1 Airway Equipment and Method: Stylet Placement Confirmation: ETT inserted through vocal cords under direct vision,  CO2 detector and breath sounds checked- equal and bilateral Secured at: 21 cm Tube secured with: Tape Dental Injury: Teeth and Oropharynx as per pre-operative assessment

## 2017-01-17 NOTE — Evaluation (Signed)
Physical Therapy Evaluation and Discharge Patient Details Name: Renee Bridges MRN: [id number] DOB: 1946/02/26 Today's Date: 01/17/2017   History of Present Illness  pt is a 71 yo female s/p lumbar spinal cord stimulator.   Clinical Impression  Pt presented with modified independence during gait and bed mobility, however pt required education for proper log rolling technique. PT discussed and provided a handout for spinal precautions with pt verbally understanding. Pt states she will have intermittent assistance at home if needed. No acute or follow up PT recommended due to pt demonstrating good functional mobility and safety upon d/c home.     Follow Up Recommendations No PT follow up    Equipment Recommendations  None recommended by PT    Recommendations for Other Services       Precautions / Restrictions Precautions Precautions: Back;Fall Precaution Booklet Issued: Yes (comment) Restrictions Weight Bearing Restrictions: No      Mobility  Bed Mobility Overal bed mobility: Needs Assistance Bed Mobility: Sidelying to Sit;Sit to Sidelying   Sidelying to sit: Supervision     Sit to sidelying: Supervision General bed mobility comments: Pt required education for log rolling to follow spinal precautions. Pt able to demonstrate correct technique with VCs to keep both LEs on bed during roll and for sequencing.   Transfers Overall transfer level: Modified independent Equipment used: Rolling walker (2 wheeled)             General transfer comment: Increased time due to pain. Pt required use of RW for stability during standing   Ambulation/Gait Ambulation/Gait assistance: Modified independent (Device/Increase time) Ambulation Distance (Feet): 400 Feet Assistive device: Rolling walker (2 wheeled) Gait Pattern/deviations: Step-through pattern;Decreased stride length Gait velocity: decreased Gait velocity interpretation: Below normal speed for age/gender General Gait  Details: Pt required VCs to increase cadence and stride length. Pt reported some nausea and fatigue and required 2 standing rest breaks during gait trial.  Stairs            Wheelchair Mobility    Modified Rankin (Stroke Patients Only)       Balance Overall balance assessment: No apparent balance deficits (not formally assessed) Sitting-balance support: No upper extremity supported;Feet supported Sitting balance-Leahy Scale: Good     Standing balance support: No upper extremity supported;During functional activity Standing balance-Leahy Scale: Fair Standing balance comment: Pt presented with some instability during initially standing without LOB, but required use of RW to improve balance.                              Pertinent Vitals/Pain Pain Assessment: Faces Faces Pain Scale: Hurts even more Pain Location: incision site Pain Descriptors / Indicators: Aching;Sore Pain Intervention(s): Monitored during session    Home Living Family/patient expects to be discharged to:: Private residence Living Arrangements: Alone Available Help at Discharge: Family;Neighbor Type of Home: Independent living facility Home Access: Level entry     Home Layout: One level Home Equipment: Walker - 2 wheels;Shower seat      Prior Function Level of Independence: Independent with assistive device(s)         Comments: Pt reports using a RW prior to admission      Hand Dominance   Dominant Hand: Right    Extremity/Trunk Assessment   Upper Extremity Assessment Upper Extremity Assessment: Overall WFL for tasks assessed    Lower Extremity Assessment Lower Extremity Assessment: Overall WFL for tasks assessed       Communication  Communication: No difficulties  Cognition Arousal/Alertness: Awake/alert Behavior During Therapy: WFL for tasks assessed/performed Overall Cognitive Status: Within Functional Limits for tasks assessed                                         General Comments General comments (skin integrity, edema, etc.): PT provided education regarding back precautions, activity expectations, log rolling, and safety with car transfers.    Exercises     Assessment/Plan    PT Assessment Patent does not need any further PT services  PT Problem List         PT Treatment Interventions      PT Goals (Current goals can be found in the Care Plan section)  Acute Rehab PT Goals Patient Stated Goal: to go home  PT Goal Formulation: With patient/family Time For Goal Achievement: 01/31/17 Potential to Achieve Goals: Good    Frequency     Barriers to discharge        Co-evaluation               AM-PAC PT "6 Clicks" Daily Activity  Outcome Measure Difficulty turning over in bed (including adjusting bedclothes, sheets and blankets)?: A Little Difficulty moving from lying on back to sitting on the side of the bed? : A Little Difficulty sitting down on and standing up from a chair with arms (e.g., wheelchair, bedside commode, etc,.)?: A Little Help needed moving to and from a bed to chair (including a wheelchair)?: A Little Help needed walking in hospital room?: A Little Help needed climbing 3-5 steps with a railing? : A Lot 6 Click Score: 17    End of Session Equipment Utilized During Treatment: Gait belt Activity Tolerance: Patient tolerated treatment well Patient left: in bed;with call bell/phone within reach;with family/visitor present Nurse Communication: Mobility status PT Visit Diagnosis: Difficulty in walking, not elsewhere classified (R26.2)    Time: 1440-1459 PT Time Calculation (min) (ACUTE ONLY): 19 min   Charges:   PT Evaluation $PT Eval Low Complexity: 1 Procedure     PT G CodesCorlis Leak:        Norlene Lanes [place], SPT  [phone]  Corlis Leakaylor Guardalabene 01/17/2017, 5:09 PM

## 2017-01-17 NOTE — Op Note (Signed)
NAME:  Renee Bridges,                           ACCOUNT NO.:  MEDICAL RECORD NO.:  [id number]  LOCATION:                                 FACILITY:  PHYSICIAN:  Lounette Sloan D. Shon BatonBrooks, M.D.      DATE OF BIRTH:  DATE OF PROCEDURE:  01/17/2017 DATE OF DISCHARGE:                              OPERATIVE REPORT   PREOPERATIVE DIAGNOSIS:  Chronic pain syndrome, status post successful spinal cord stimulator trial.  POSTOPERATIVE DIAGNOSIS:  Chronic pain syndrome, status post successful spinal cord stimulator trial.  OPERATIVE PROCEDURE:  Implantation of spinal cord stimulator.  SYSTEM USED:  St. Jude Tripole lead with the nonrechargeable battery.  COMPLICATIONS:  None.  CONDITION:  Stable.  FIRST ASSISTANT:  Anette Riedelarmen Mayo, [place].  HISTORY:  This is a very pleasant 71 year old woman, who has been having progressive back, buttock, and bilateral leg pain.  Attempts at conservative management failed to alleviate her symptoms and so we elected to proceed with surgery after she had a successful trial implant.  All appropriate risks, benefits, and alternatives were discussed and consent was obtained.  OPERATIVE NOTE:  The patient was brought to the operating room and placed supine on the operating table.  After successful induction of general anesthesia and endotracheal intubation, TEDs and SCDs were applied.  She was turned prone onto the Wilson frame and all bony prominences were well padded.  The back was then prepped and draped in a standard fashion.  Time-out was taken confirming patient, procedure, and all other pertinent important data.  The lateral fluoro views were taken and I counted up to the T9-10 disc space.  According to the rep, maximum coverage was at the T8 vertebral body and so I elected to do a T9 laminotomy.  I infiltrated the incision site with 0.25% Marcaine with epi.  After successful time-out confirming patient, procedure, and all other pertinent important data, I made an incision  and sharply dissected down to the deep fascia.  Deep fascia was sharply incised and I stripped the paraspinal muscles to expose the T9-10 interspace and the T10-11 interspinous process space.  Self-retaining retractor was placed and I confirmed again using AP and lateral fluoroscopy, the T9 pedicle and the T9-10 disk space.  A double-action Leksell rongeur was used to remove the majority of the spinous process of the T9.  The 2 and 3 mm Kerrison punch were used to perform a laminotomy of T9.  Once this was done, I did decompress through the central raphae with a Penfield 4 and then used my 2 mm Kerrison punch to excise the ligamentum flavum and expose the epidural fat and gently dissected through this and exposed the dorsal surface of the thecal sac.  The dural spatula was then used to pass along the dorsal surface of the epidural space.  It passed without any issue.  I then obtained a tripolar lead and advanced.  At this point, I realized it was deviating to the left, so I repositioned it.  After several passes, it was either deviating to the left, to the right just inferior to the T7-T8 disk space.  At this  point, I was concerned that there was some mechanical block that was causing it to deviate.  In order to prevent catastrophic spinal cord injury, I spoke with the rep and she agreed that just by leading into the posterior aspect of the T8 vertebral body and not trying to pass the disk space where it was midline was best, and so once I had properly positioned and the rep was satisfied that she could get adequate coverage, I secured it directly to the T10 spinous process with an Ethibond suture.  I then passed it through the T10-T11 interspinous process space and sutured it down with Ethibond suture.  I then made a second incision at the battery site, dissected down 4 cm and created a pocket.  I then passed using submuscular Passer the wires from the thoracic incision down to the  battery site.  I then secured it to the battery and then secured the battery to the deep fascia.  Once the system was completely connected, I then confirmed with fluoro that my paddles had not moved and the rep confirmed that the unit was functioning.  Both wounds were copiously irrigated with normal saline. I then secured the battery to the deep fascia with two #1 Ethibond sutures.  I then closed the deep fascia of each wound with interrupted #1 Vicryl sutures, then a layer of 2-0 Vicryl sutures and 3-0 Monocryl. Steri-Strips and dry dressing were applied and the patient was ultimately extubated, transferred to the PACU without incident.  At the end of the case, all needle and sponge counts were correct.  There were no adverse intraoperative events.     Renee Bridges, M.D.     DDB/MEDQ  D:  01/17/2017  T:  01/17/2017  Job:  [id number]

## 2017-01-17 NOTE — Transfer of Care (Signed)
Immediate Anesthesia Transfer of Care Note  Patient: Renee MannDella S Alonge  Procedure(s) Performed: Procedure(s) with comments: LUMBAR SPINAL CORD STIMULATOR INSERTION (N/A) - Requests 3 hrs  Patient Location: PACU  Anesthesia Type:General  Level of Consciousness: awake  Airway & Oxygen Therapy: Patient Spontanous Breathing and Patient connected to nasal cannula oxygen  Post-op Assessment: Report given to RN, Post -op Vital signs reviewed and stable and Patient moving all extremities X 4  Post vital signs: Reviewed and stable  Last Vitals:  Vitals:   01/17/17 0656  BP: (!) 158/97  Pulse: 84  Resp: 20  Temp: 36.4 C    Last Pain:  Vitals:   01/17/17 0656  TempSrc: Oral         Complications: No apparent anesthesia complications

## 2017-01-18 NOTE — Progress Notes (Signed)
Patient alert and oriented, mae's well, voiding adequate amount of urine, swallowing without difficulty, no c/o pain at time of discharge. Patient discharged home with family. Script and discharged instructions given to patient. Patient and family stated understanding of instructions given. Patient has an appointment with Dr. Brooks  

## 2017-01-18 NOTE — Anesthesia Postprocedure Evaluation (Signed)
Anesthesia Post Note  Patient: Renee Bridges  Procedure(s) Performed: Procedure(s) (LRB): LUMBAR SPINAL CORD STIMULATOR INSERTION (N/A)  Patient location during evaluation: PACU Anesthesia Type: General Level of consciousness: awake and alert Pain management: pain level controlled Vital Signs Assessment: post-procedure vital signs reviewed and stable Respiratory status: spontaneous breathing, nonlabored ventilation, respiratory function stable and patient connected to nasal cannula oxygen Cardiovascular status: blood pressure returned to baseline and stable Postop Assessment: no signs of nausea or vomiting Anesthetic complications: no       Last Vitals:  Vitals:   01/18/17 0252 01/18/17 0752  BP: (!) 142/81 109/71  Pulse: 97 98  Resp: 18 18  Temp: 37.4 C 37.7 C    Last Pain:  Vitals:   01/18/17 0752  TempSrc: Oral  PainSc:                  Jiles GarterJACKSON,[name]

## 2017-01-18 NOTE — Care Management CC44 (Signed)
Condition Code 44 Documentation Completed  Patient Details  Name: Renee MannDella S Bridges MRN: [id number] Date of Birth: 02/07/1946 Patient discharged prior to CM giving Code 44  Condition Code 44 given:    Patient signature on Condition Code 1644 notice:    Documentation of 2 MD's agreement:    Code 44 added to claim:       Durenda GuthrieBrady, Rosselyn Martha Naomi, RN 01/18/2017, 1:01 PM

## 2017-01-18 NOTE — Progress Notes (Signed)
OT Cancellation Note  Patient Details Name: Renee Bridges MRN: [id number] DOB: 02-Feb-1946   Cancelled Treatment:    Reason Eval/Treat Not Completed: OT screened, no needs identified, will sign off. [name] demonstrates good understanding of all spinal precautions as related to ADL. Pt demonstrating ability to complete all ADL with modified independence while adhering to back precautions. Discussed equipment and pt has all needs met. No further acute OT needs identified. OT will sign off.   Norman Herrlich, MS OTR/L  Pager: [phone]   Norman Herrlich 01/18/2017, 8:52 AM

## 2017-01-18 NOTE — Progress Notes (Signed)
    Subjective: 1 Day Post-Op Procedure(s) (LRB): LUMBAR SPINAL CORD STIMULATOR INSERTION (N/A) Patient reports pain as 0 on 0-10 scale.   Denies CP or SOB.  Voiding without difficulty. Positive flatus.Pt has been nausated and vomiting through the night.  The last time she vomited was 3am. Objective: Vital signs in last 24 hours: Temp:  [97.7 F (36.5 C)-99.3 F (37.4 C)] 99.3 F (37.4 C) (05/17 0252) Pulse Rate:  [78-97] 97 (05/17 0252) Resp:  [12-20] 18 (05/17 0252) BP: (110-146)/(62-95) 142/81 (05/17 0252) SpO2:  [93 %-96 %] 94 % (05/17 0252)  Intake/Output from previous day: 05/16 0701 - 05/17 0700 In: 1740 [P.O.:240; I.V.:1500] Out: 50 [Blood:50] Intake/Output this shift: No intake/output data recorded.  Labs: No results for input(s): HGB in the last 72 hours. No results for input(s): WBC, RBC, HCT, PLT in the last 72 hours. No results for input(s): NA, K, CL, CO2, BUN, CREATININE, GLUCOSE, CALCIUM in the last 72 hours. No results for input(s): LABPT, INR in the last 72 hours.  Physical Exam: Neurologically intact ABD soft Sensation intact distally Dorsiflexion/Plantar flexion intact Incision: no drainage Compartment soft  Assessment/Plan: 1 Day Post-Op Procedure(s) (LRB): LUMBAR SPINAL CORD STIMULATOR INSERTION (N/A) Advance diet Up with therapy  Pt will meet  With the St. Jude rep this am Will continue to monitor her nausea Consider DC later today   Renee Bridges, Baxter Kailarmen Bridges for Dr. Venita Lickahari Bridges [facility] [phone] 01/18/2017, 7:09 AM    Patient ID: Renee Bridges, female   DOB: 08/04/1946, 71 y.o.   MRN: [id number]

## 2017-01-18 NOTE — Care Management Obs Status (Signed)
MEDICARE OBSERVATION STATUS NOTIFICATION   Patient Details  Name: Renee Bridges MRN: [id number] Date of Birth: 07/07/1946   Medicare Observation Status Notification Given:  Other (see comment) Patient discharged prior to CM delivering Code 7344 and Obs letter   Durenda GuthrieBrady, Annsleigh Dragoo Naomi, RN 01/18/2017, 1:01 PM

## 2017-01-22 ENCOUNTER — Encounter (HOSPITAL_COMMUNITY): Payer: Self-pay | Admitting: Orthopedic Surgery

## 2017-01-23 ENCOUNTER — Encounter (HOSPITAL_COMMUNITY): Payer: Self-pay | Admitting: Orthopedic Surgery

## 2017-01-31 NOTE — Discharge Summary (Signed)
Physician Discharge Summary  Patient ID: Renee Bridges MRN: [id number] DOB/AGE: 73/09/1945 73 y.o.  Admit date: 01/17/2017 Discharge date: 01/18/17  Admission Diagnoses:  Chronic Pain Syndrome  Discharge Diagnoses:  Active Problems:   Back pain   Past Medical History:  Diagnosis Date  . Anxiety   . Arthritis   . Asthma   . Depression   . Headache   . History of hiatal hernia   . Hypertension   . Kidney cyst, acquired   . Seizures (HCC)    hx. as child  . Shortness of breath dyspnea    with excertion    Surgeries: Procedure(s): LUMBAR SPINAL CORD STIMULATOR INSERTION on 01/17/2017   Consultants (if any):   Discharged Condition: Improved  Hospital Course: Renee MannDella S Gabrielle is an 71 y.o. female who was admitted 01/17/2017 with a diagnosis of Chronic Pain Syndrome and went to the operating room on 01/17/2017 and underwent the above named procedures.  Post op day 1 pt had some nausea/vomitting over night.  Vomitting had stopped by the time I rounded on pt. Pt was voiding w/o difficulty.  Pt ambulating in hallway.  Pain controlled on oral meds.   She was given perioperative antibiotics:  Anti-infectives    Start     Dose/Rate Route Frequency Ordered Stop   01/17/17 1700  ceFAZolin (ANCEF) IVPB 2g/100 mL premix     2 g 200 mL/hr over 30 Minutes Intravenous Every 8 hours 01/17/17 1330 01/18/17 0024   01/17/17 0648  ceFAZolin (ANCEF) 2-4 GM/100ML-% IVPB    Comments:  Rogelia MireMichael, Cynthia   : cabinet override      01/17/17 0648 01/17/17 0850   01/17/17 0646  ceFAZolin (ANCEF) IVPB 2g/100 mL premix     2 g 200 mL/hr over 30 Minutes Intravenous 30 min pre-op 01/17/17 0646 01/17/17 0850    .  She was given sequential compression devices, early ambulation, and TED for DVT prophylaxis.  She benefited maximally from the hospital stay and there were no complications.    Recent vital signs:  Vitals:   01/18/17 0752 01/18/17 0805  BP: 109/71 133/73  Pulse: 98 84  Resp: 18 20  Temp:  99.8 F (37.7 C) 99.1 F (37.3 C)    Recent laboratory studies:  Lab Results  Component Value Date   HGB 15.8 (H) 01/10/2017   HGB 12.4 05/15/2015   HGB 13.9 05/14/2015   Lab Results  Component Value Date   WBC 7.6 01/10/2017   PLT 211 01/10/2017   Lab Results  Component Value Date   INR 0.98 05/06/2015   Lab Results  Component Value Date   NA 139 01/10/2017   K 4.4 01/10/2017   CL 103 01/10/2017   CO2 27 01/10/2017   BUN 10 01/10/2017   CREATININE 1.13 (H) 01/10/2017   GLUCOSE 100 (H) 01/10/2017    Discharge Medications:   Allergies as of 01/18/2017      Reactions   Codeine Rash      Medication List    STOP taking these medications   OVER THE COUNTER MEDICATION   tiZANidine 4 MG tablet Commonly known as:  ZANAFLEX     TAKE these medications   DULoxetine 60 MG capsule Commonly known as:  CYMBALTA Take 1 capsule (60 mg total) by mouth daily.   fexofenadine 180 MG tablet Commonly known as:  ALLEGRA Take 180 mg by mouth daily as needed (for sneezing, watery eyes, itchy nose/throat.).   hydrochlorothiazide 25 MG tablet Commonly known as:  HYDRODIURIL Take 1 tablet (25 mg total) by mouth daily.   methocarbamol 500 MG tablet Commonly known as:  ROBAXIN Take 1 tablet (500 mg total) by mouth 3 (three) times daily as needed for muscle spasms.   Nebulizers Misc as needed.   ondansetron 4 MG tablet Commonly known as:  ZOFRAN Take 1 tablet (4 mg total) by mouth every 8 (eight) hours as needed for nausea or vomiting.   oxyCODONE-acetaminophen 10-325 MG tablet Commonly known as:  PERCOCET Take 1 tablet by mouth every 4 (four) hours as needed for pain.       Diagnostic Studies: Dg Thoracic Spine 2 View  Result Date: 01/17/2017 CLINICAL DATA:  Spinal stimulator placement EXAM: DG C-ARM 61-120 MIN; THORACIC SPINE 2 VIEWS COMPARISON:  Thoracic MRI 12/20/2016 FINDINGS: AP and lateral C-arm images of the thoracic spine joint placement of spinal cord  stimulator in the dorsal spinal canal. The level of the stimulator cannot be determined on these images and follow-up radiographs suggested. IMPRESSION: Dorsal thoracic spinal cord stimulator placement as above. Electronically Signed   By: Marlan Palau M.D.   On: 01/17/2017 11:59   Dg C-arm 61-120 Min  Result Date: 01/17/2017 CLINICAL DATA:  Spinal stimulator placement EXAM: DG C-ARM 61-120 MIN; THORACIC SPINE 2 VIEWS COMPARISON:  Thoracic MRI 12/20/2016 FINDINGS: AP and lateral C-arm images of the thoracic spine joint placement of spinal cord stimulator in the dorsal spinal canal. The level of the stimulator cannot be determined on these images and follow-up radiographs suggested. IMPRESSION: Dorsal thoracic spinal cord stimulator placement as above. Electronically Signed   By: Marlan Palau M.D.   On: 01/17/2017 11:59    Disposition: 01-Home or Self Care  Pt will present to clinic in two weeks Post op medication provided Pt is able to contact [employer] rep if she has further question about such   Discharge Instructions    Incentive spirometry RT    Complete by:  As directed       Follow-up Information    Venita Lick, MD. Schedule an appointment as soon as possible for a visit in 2 weeks.   Specialty:  Orthopedic Surgery Why:  If symptoms worsen, For suture removal, For wound re-check Contact information: [address] [place] [address] [phone]            Signed: Kirt Boys 01/31/2017, 12:45 PM

## 2017-02-01 ENCOUNTER — Telehealth: Payer: Self-pay | Admitting: Unknown Physician Specialty

## 2017-02-01 NOTE — Telephone Encounter (Signed)
error 

## 2017-03-16 NOTE — Anesthesia Postprocedure Evaluation (Signed)
Anesthesia Post Note  Patient: Renee Bridges  Procedure(s) Performed: Procedure(s) (LRB): LUMBAR SPINAL CORD STIMULATOR INSERTION (N/A)     Anesthesia Post Evaluation  Last Vitals:  Vitals:   01/18/17 0752 01/18/17 0805  BP: 109/71 133/73  Pulse: 98 84  Resp: 18 20  Temp: 37.7 C 37.3 C    Last Pain:  Vitals:   01/18/17 0906  TempSrc:   PainSc: 9                  Jermine Bibbee EDWARD

## 2017-03-16 NOTE — Addendum Note (Signed)
Addendum  created 03/16/17 1251 by Breelyn Icard, MD   Sign clinical note    

## 2017-03-20 ENCOUNTER — Ambulatory Visit: Payer: Medicare Other | Admitting: Unknown Physician Specialty

## 2017-03-21 ENCOUNTER — Ambulatory Visit (INDEPENDENT_AMBULATORY_CARE_PROVIDER_SITE_OTHER): Payer: Medicare Other | Admitting: Unknown Physician Specialty

## 2017-03-21 ENCOUNTER — Encounter: Payer: Self-pay | Admitting: Unknown Physician Specialty

## 2017-03-21 DIAGNOSIS — F322 Major depressive disorder, single episode, severe without psychotic features: Secondary | ICD-10-CM | POA: Diagnosis not present

## 2017-03-21 DIAGNOSIS — N183 Chronic kidney disease, stage 3 unspecified: Secondary | ICD-10-CM

## 2017-03-21 DIAGNOSIS — I10 Essential (primary) hypertension: Secondary | ICD-10-CM

## 2017-03-21 MED ORDER — LISINOPRIL-HYDROCHLOROTHIAZIDE 10-12.5 MG PO TABS: 1.0000 | ORAL_TABLET | Freq: Every day | ORAL | 3 refills | Status: DC

## 2017-03-21 MED ORDER — DULOXETINE HCL 60 MG PO CPEP: 60.0000 mg | ORAL_CAPSULE | Freq: Every day | ORAL | 5 refills | Status: DC

## 2017-03-21 NOTE — Assessment & Plan Note (Signed)
Start Lisinopril  CMP in 1 month

## 2017-03-21 NOTE — Assessment & Plan Note (Addendum)
Stable.  Low GFR.  Will add Lisinopril to BP meds

## 2017-03-21 NOTE — Assessment & Plan Note (Addendum)
Worsening.  Stopped Cymbalta for unknown reason.  Restart Cymbalta.  Recheck in 1 month

## 2017-03-21 NOTE — Progress Notes (Signed)
BP 133/84   Pulse 65   Temp (!) 97.5 F (36.4 C)   Ht 5' 2.2" (1.58 m)   Wt 180 lb (81.6 kg)   LMP  (LMP Unknown)   SpO2 98%   BMI 32.71 kg/m    Subjective:    Patient ID: Chauncey MannDella S Branch, female    DOB: 02/08/1946, 71 y.o.   MRN: [id number]  HPI: Chauncey MannDella S Hendrix is a 71 y.o. female  Chief Complaint  Patient presents with  . Depression  . Hypertension   Hypertension Using medications without difficulty Average home BPs 130's   No problems or lightheadedness No chest pain with exertion or shortness of breath No Edema   Dizzy x2 about 3 weeks ago.  Even fell  Depression She is depressed and tearful.  No motivation.  Stopped Cymbalta though didn't feel it was helping [facility] 03/21/2017 09/25/2016 08/14/2016 07/17/2016 12/27/2015  Decreased Interest 3 3 3 3 2   Down, Depressed, Hopeless 1 2 3 3  0  PHQ - 2 Score 4 5 6 6 2   Altered sleeping 3 3 3 3 3   Tired, decreased energy 3 3 2 3 2   Change in appetite 3 3 2 3 2   Feeling bad or failure about yourself  3 2 0 3 1  Trouble concentrating 3 2 3  0 0  Moving slowly or fidgety/restless 2 2 3  0 0  Suicidal thoughts 2 0 0 0 0  PHQ-9 Score 23 20 19 18 10   Difficult doing work/chores - - Very difficult - -   Back and leg pain Stimulator did not help.  Planing on seeing back doctor.    Relevant past medical, surgical, family and social history reviewed and updated as indicated. Interim medical history since our last visit reviewed. Allergies and medications reviewed and updated.  Review of Systems  Per HPI unless specifically indicated above     Objective:    BP 133/84   Pulse 65   Temp (!) 97.5 F (36.4 C)   Ht 5' 2.2" (1.58 m)   Wt 180 lb (81.6 kg)   LMP  (LMP Unknown)   SpO2 98%   BMI 32.71 kg/m   Wt Readings from Last 3 Encounters:  03/21/17 180 lb (81.6 kg)  01/10/17 178 lb 9.6 oz (81 kg)  12/18/16 180 lb 12.8 oz (82 kg)    Physical Exam  Constitutional: She is oriented to person, place,  and time. She appears well-developed and well-nourished. No distress.  HENT:  Head: Normocephalic and atraumatic.  Eyes: Conjunctivae and lids are normal. Right eye exhibits no discharge. Left eye exhibits no discharge. No scleral icterus.  Neck: Normal range of motion. Neck supple. No JVD present. Carotid bruit is not present.  Cardiovascular: Normal rate, regular rhythm and normal heart sounds.   Pulmonary/Chest: Effort normal and breath sounds normal.  Abdominal: Normal appearance. There is no splenomegaly or hepatomegaly.  Musculoskeletal: Normal range of motion.  Neurological: She is alert and oriented to person, place, and time.  Skin: Skin is warm, dry and intact. No rash noted. No pallor.  Psychiatric: She has a normal mood and affect. Her behavior is normal. Judgment and thought content normal.    Results for orders placed or performed during the hospital encounter of 01/10/17  Surgical pcr screen  Result Value Ref Range   MRSA, PCR NEGATIVE NEGATIVE   Staphylococcus aureus NEGATIVE NEGATIVE  CBC  Result Value Ref Range   WBC 7.6 4.0 - 10.5  K/uL   RBC 5.07 3.87 - 5.11 MIL/uL   Hemoglobin 15.8 (H) 12.0 - 15.0 g/dL   HCT 16.1 (H) 09.6 - 04.5 %   MCV 96.8 78.0 - 100.0 fL   MCH 31.2 26.0 - 34.0 pg   MCHC 32.2 30.0 - 36.0 g/dL   RDW 40.9 81.1 - 91.4 %   Platelets 211 150 - 400 K/uL  Basic metabolic panel  Result Value Ref Range   Sodium 139 135 - 145 mmol/L   Potassium 4.4 3.5 - 5.1 mmol/L   Chloride 103 101 - 111 mmol/L   CO2 27 22 - 32 mmol/L   Glucose, Bld 100 (H) 65 - 99 mg/dL   BUN 10 6 - 20 mg/dL   Creatinine, Ser 7.82 (H) 0.44 - 1.00 mg/dL   Calcium 9.7 8.9 - 95.6 mg/dL   GFR calc non Af Amer 47 (L) >60 mL/min   GFR calc Af Amer 55 (L) >60 mL/min   Anion gap 9 5 - 15      Assessment & Plan:   Problem List Items Addressed This Visit      Unprioritized   Depression    Worsening.  Stopped Cymbalta for unknown reason.  Restart Cymbalta.  Recheck in 1 month        Relevant Medications   DULoxetine (CYMBALTA) 60 MG capsule   Essential hypertension    Stable.  Low GFR.  Will add Lisinopril to BP meds      Relevant Medications   lisinopril-hydrochlorothiazide (ZESTORETIC) 10-12.5 MG tablet       Follow up plan: Return in about 4 weeks (around 04/18/2017) for BP and depression.

## 2017-03-28 ENCOUNTER — Telehealth: Payer: Self-pay | Admitting: Unknown Physician Specialty

## 2017-03-28 MED ORDER — ESCITALOPRAM OXALATE 5 MG PO TABS: 5.0000 mg | ORAL_TABLET | Freq: Every day | ORAL | 1 refills | Status: DC

## 2017-03-28 NOTE — Telephone Encounter (Signed)
Pt states that she has stopped taking these two meds as they are making her dizzy and nauseous.  lisinopril-hydrochlorothiazide (ZESTORETIC) 10-12.5 MG tablet [[id number]][[id number]]   DULoxetine (CYMBALTA) 60 MG capsule [[id number]][[id number]]   Please c/b to discuss with her. Thanks, knb

## 2017-03-28 NOTE — Telephone Encounter (Signed)
Let her know I'm calling in a different medication for nerves

## 2017-03-28 NOTE — Telephone Encounter (Signed)
Called and let patient know about medication change. She states that she will keep taking the lisinopril/hctz as she thinks the duloxetine was causing the symptoms she was having. Patient verbalized understanding about medications and I asked for her to give me a call if she had any questions.

## 2017-03-28 NOTE — Telephone Encounter (Signed)
Called and spoke to the patient. I asked the patient if she stopped both of these medications at the same time and she stated that she did. Not sure if it is one medication or both causing the symptoms. Patient states she felt dizziness, nausea, and sleepiness since starting these medications. Patient also mentioned that the cymbalta was not helping at all and that she was in a bad mood all of the time. I told the patient that I would call her back after sending this message to [place].

## 2017-04-11 ENCOUNTER — Ambulatory Visit: Payer: Medicare Other

## 2017-04-12 ENCOUNTER — Ambulatory Visit: Payer: Medicare Other

## 2017-05-04 ENCOUNTER — Ambulatory Visit: Payer: Medicare Other | Admitting: Unknown Physician Specialty

## 2017-05-04 ENCOUNTER — Encounter: Payer: Self-pay | Admitting: Unknown Physician Specialty

## 2017-05-04 ENCOUNTER — Ambulatory Visit (INDEPENDENT_AMBULATORY_CARE_PROVIDER_SITE_OTHER): Payer: Medicare Other

## 2017-05-04 VITALS — BP 107/71 | HR 71 | Temp 97.4°F | Resp 16 | Ht 63.0 in | Wt 182.1 lb

## 2017-05-04 DIAGNOSIS — G8929 Other chronic pain: Secondary | ICD-10-CM

## 2017-05-04 DIAGNOSIS — F322 Major depressive disorder, single episode, severe without psychotic features: Secondary | ICD-10-CM

## 2017-05-04 DIAGNOSIS — R29898 Other symptoms and signs involving the musculoskeletal system: Secondary | ICD-10-CM | POA: Insufficient documentation

## 2017-05-04 DIAGNOSIS — M545 Low back pain: Secondary | ICD-10-CM | POA: Diagnosis not present

## 2017-05-04 DIAGNOSIS — Z0000 Encounter for general adult medical examination without abnormal findings: Secondary | ICD-10-CM

## 2017-05-04 DIAGNOSIS — Z7409 Other reduced mobility: Secondary | ICD-10-CM

## 2017-05-04 DIAGNOSIS — Z1231 Encounter for screening mammogram for malignant neoplasm of breast: Secondary | ICD-10-CM

## 2017-05-04 NOTE — Assessment & Plan Note (Addendum)
No improvement.  Discussed medication should be used daily.  Restart Lexapro daily.  States she is depressed a lot as home alone.  Discussed going to assisted living.

## 2017-05-04 NOTE — Patient Instructions (Addendum)
[name] , Thank you for taking time to come for your Medicare Wellness Visit. I appreciate your ongoing commitment to your health goals. Please review the following plan we discussed and let me know if I can assist you in the future.   Screening recommendations/referrals: Colonoscopy: completed 06/23/2013 Mammogram: completed 01/17/2013, due now- call to schedule  Bone Density:  completed 01/14/2013 Recommended yearly ophthalmology/optometry visit for glaucoma screening and checkup Recommended yearly dental visit for hygiene and checkup  Vaccinations: Influenza vaccine: up to date, due 05/2017 Pneumococcal vaccine:up to date Tdap vaccine: due, check with your insurance company for coverage Shingles vaccine: up to date  Advanced directives: Advance directive discussed with you today. I have provided a copy for you to complete at home and have notarized. Once this is complete please bring a copy in to our office so we can scan it into your chart.  Conditions/risks identified: Recommend drinking at least 6-6 glasses of water a day  Next appointment: Follow up in one year for your annual wellness exam.    Preventive Care 65 Years and Older, Female Preventive care refers to lifestyle choices and visits with your health care provider that can promote health and wellness. What does preventive care include?  A yearly physical exam. This is also called an annual well check.  Dental exams once or twice a year.  Routine eye exams. Ask your health care provider how often you should have your eyes checked.  Personal lifestyle choices, including:  Daily care of your teeth and gums.  Regular physical activity.  Eating a healthy diet.  Avoiding tobacco and drug use.  Limiting alcohol use.  Practicing safe sex.  Taking low-dose aspirin every day.  Taking vitamin and mineral supplements as recommended by your health care provider. What happens during an annual well check? The  services and screenings done by your health care provider during your annual well check will depend on your age, overall health, lifestyle risk factors, and family history of disease. Counseling  Your health care provider may ask you questions about your:  Alcohol use.  Tobacco use.  Drug use.  Emotional well-being.  Home and relationship well-being.  Sexual activity.  Eating habits.  History of falls.  Memory and ability to understand (cognition).  Work and work Astronomerenvironment.  Reproductive health. Screening  You may have the following tests or measurements:  Height, weight, and BMI.  Blood pressure.  Lipid and cholesterol levels. These may be checked every 5 years, or more frequently if you are over 73 years old.  Skin check.  Lung cancer screening. You may have this screening every year starting at age 73 if you have a 30-pack-year history of smoking and currently smoke or have quit within the past 15 years.  Fecal occult blood test (FOBT) of the stool. You may have this test every year starting at age 73.  Flexible sigmoidoscopy or colonoscopy. You may have a sigmoidoscopy every 5 years or a colonoscopy every 10 years starting at age 73.  Hepatitis C blood test.  Hepatitis B blood test.  Sexually transmitted disease (STD) testing.  Diabetes screening. This is done by checking your blood sugar (glucose) after you have not eaten for a while (fasting). You may have this done every 1-3 years.  Bone density scan. This is done to screen for osteoporosis. You may have this done starting at age 73.  Mammogram. This may be done every 1-2 years. Talk to your health care provider about how often  you should have regular mammograms. Talk with your health care provider about your test results, treatment options, and if necessary, the need for more tests. Vaccines  Your health care provider may recommend certain vaccines, such as:  Influenza vaccine. This is recommended  every year.  Tetanus, diphtheria, and acellular pertussis (Tdap, Td) vaccine. You may need a Td booster every 10 years.  Zoster vaccine. You may need this after age 68.  Pneumococcal 13-valent conjugate (PCV13) vaccine. One dose is recommended after age 105.  Pneumococcal polysaccharide (PPSV23) vaccine. One dose is recommended after age 83. Talk to your health care provider about which screenings and vaccines you need and how often you need them. This information is not intended to replace advice given to you by your health care provider. Make sure you discuss any questions you have with your health care provider. Document Released: 09/17/2015 Document Revised: 05/10/2016 Document Reviewed: 06/22/2015 Elsevier Interactive Patient Education  2017 [place] Prevention in the Home Falls can cause injuries. They can happen to people of all ages. There are many things you can do to make your home safe and to help prevent falls. What can I do on the outside of my home?  Regularly fix the edges of walkways and driveways and fix any cracks.  Remove anything that might make you trip as you walk through a door, such as a raised step or threshold.  Trim any bushes or trees on the path to your home.  Use bright outdoor lighting.  Clear any walking paths of anything that might make someone trip, such as rocks or tools.  Regularly check to see if handrails are loose or broken. Make sure that both sides of any steps have handrails.  Any raised decks and porches should have guardrails on the edges.  Have any leaves, snow, or ice cleared regularly.  Use sand or salt on walking paths during winter.  Clean up any spills in your garage right away. This includes oil or grease spills. What can I do in the bathroom?  Use night lights.  Install grab bars by the toilet and in the tub and shower. Do not use towel bars as grab bars.  Use non-skid mats or decals in the tub or shower.  If  you need to sit down in the shower, use a plastic, non-slip stool.  Keep the floor dry. Clean up any water that spills on the floor as soon as it happens.  Remove soap buildup in the tub or shower regularly.  Attach bath mats securely with double-sided non-slip rug tape.  Do not have throw rugs and other things on the floor that can make you trip. What can I do in the bedroom?  Use night lights.  Make sure that you have a light by your bed that is easy to reach.  Do not use any sheets or blankets that are too big for your bed. They should not hang down onto the floor.  Have a firm chair that has side arms. You can use this for support while you get dressed.  Do not have throw rugs and other things on the floor that can make you trip. What can I do in the kitchen?  Clean up any spills right away.  Avoid walking on wet floors.  Keep items that you use a lot in easy-to-reach places.  If you need to reach something above you, use a strong step stool that has a grab bar.  Keep electrical cords  out of the way.  Do not use floor polish or wax that makes floors slippery. If you must use wax, use non-skid floor wax.  Do not have throw rugs and other things on the floor that can make you trip. What can I do with my stairs?  Do not leave any items on the stairs.  Make sure that there are handrails on both sides of the stairs and use them. Fix handrails that are broken or loose. Make sure that handrails are as long as the stairways.  Check any carpeting to make sure that it is firmly attached to the stairs. Fix any carpet that is loose or worn.  Avoid having throw rugs at the top or bottom of the stairs. If you do have throw rugs, attach them to the floor with carpet tape.  Make sure that you have a light switch at the top of the stairs and the bottom of the stairs. If you do not have them, ask someone to add them for you. What else can I do to help prevent falls?  Wear shoes  that:  Do not have high heels.  Have rubber bottoms.  Are comfortable and fit you well.  Are closed at the toe. Do not wear sandals.  If you use a stepladder:  Make sure that it is fully opened. Do not climb a closed stepladder.  Make sure that both sides of the stepladder are locked into place.  Ask someone to hold it for you, if possible.  Clearly mark and make sure that you can see:  Any grab bars or handrails.  First and last steps.  Where the edge of each step is.  Use tools that help you move around (mobility aids) if they are needed. These include:  Canes.  Walkers.  Scooters.  Crutches.  Turn on the lights when you go into a dark area. Replace any light bulbs as soon as they burn out.  Set up your furniture so you have a clear path. Avoid moving your furniture around.  If any of your floors are uneven, fix them.  If there are any pets around you, be aware of where they are.  Review your medicines with your doctor. Some medicines can make you feel dizzy. This can increase your chance of falling. Ask your doctor what other things that you can do to help prevent falls. This information is not intended to replace advice given to you by your health care provider. Make sure you discuss any questions you have with your health care provider. Document Released: 06/17/2009 Document Revised: 01/27/2016 Document Reviewed: 09/25/2014 Elsevier Interactive Patient Education  2017 [employer].

## 2017-05-04 NOTE — Assessment & Plan Note (Addendum)
Pt sees Dr. Gerilyn Nestleamon for her back pain.  She is getting another referral

## 2017-05-04 NOTE — Progress Notes (Signed)
Subjective:   Renee Bridges is a 71 y.o. female who presents for Medicare Annual (Subsequent) preventive examination.  Review of Systems:  Cardiac Risk Factors include: advanced age (>2055men, 44>65 women);hypertension;dyslipidemia;obesity (BMI >30kg/m2)     Objective:     Vitals: BP 107/71 (BP Location: Left Arm, Patient Position: Sitting)   Pulse 71   Temp (!) 97.4 F (36.3 C)   Resp 16   Ht 5\' 3"  (1.6 m)   Wt 182 lb 1.6 oz (82.6 kg)   LMP  (LMP Unknown)   SpO2 98%   BMI 32.26 kg/m   Body mass index is 32.26 kg/m.   Tobacco History  Smoking Status  . Never Smoker  Smokeless Tobacco  . Never Used     Counseling given: Not Answered   Past Medical History:  Diagnosis Date  . Anxiety   . Arthritis   . Asthma   . Depression   . Headache   . History of hiatal hernia   . Hypertension   . Kidney cyst, acquired   . Seizures (HCC)    hx. as child  . Shortness of breath dyspnea    with excertion   Past Surgical History:  Procedure Laterality Date  . ABDOMINAL HYSTERECTOMY    . BACK SURGERY    . colonoscopy a year ago    . LEG SURGERY Left 03/2015  . mass removed from back - 20 years ago    . SPINAL CORD STIMULATOR INSERTION N/A 01/17/2017   Procedure: LUMBAR SPINAL CORD STIMULATOR INSERTION;  Surgeon: Venita LickBrooks, Dahari, MD;  Location: [facility] OR;  Service: Orthopedics;  Laterality: N/A;  Requests 3 hrs  . TONSILLECTOMY    . TOTAL KNEE REVISION Left 05/13/2015   Procedure: REVISION LEFT  KNEE ARTHROPLASTY, REVISION PATELLA POLY EXCHANGE;  Surgeon: Samson FredericBrian Swinteck, MD;  Location: [facility];  Service: Orthopedics;  Laterality: Left;   Family History  Problem Relation Age of Onset  . Cancer Mother   . Cancer Father   . Diabetes Brother   . Cancer Brother   . Cancer Brother    History  Sexual Activity  . Sexual activity: No    Outpatient Encounter Prescriptions as of 05/04/2017  Medication Sig  . escitalopram (LEXAPRO) 5 MG tablet Take 1 tablet (5 mg total) by mouth  daily.  Marland Kitchen. lisinopril-hydrochlorothiazide (ZESTORETIC) 10-12.5 MG tablet Take 1 tablet by mouth daily.  . fexofenadine (ALLEGRA) 180 MG tablet Take 180 mg by mouth daily as needed (for sneezing, watery eyes, itchy nose/throat.).  Marland Kitchen. methocarbamol (ROBAXIN) 500 MG tablet Take 1 tablet (500 mg total) by mouth 3 (three) times daily as needed for muscle spasms. (Patient not taking: Reported on 05/04/2017)  . Nebulizers MISC as needed.  . ondansetron (ZOFRAN) 4 MG tablet Take 1 tablet (4 mg total) by mouth every 8 (eight) hours as needed for nausea or vomiting. (Patient not taking: Reported on 05/04/2017)  . oxyCODONE-acetaminophen (PERCOCET) 10-325 MG tablet Take 1 tablet by mouth every 4 (four) hours as needed for pain. (Patient not taking: Reported on 03/21/2017)  . tiZANidine (ZANAFLEX) 4 MG tablet Take 1 tablet by mouth daily as needed.   No facility-administered encounter medications on file as of 05/04/2017.     Activities of Daily Living In your present state of health, do you have any difficulty performing the following activities: 05/04/2017 01/10/2017  Hearing? Y Y  Comment - -  Vision? N N  Difficulty concentrating or making decisions? N N  Walking or climbing  stairs? Y Y  Dressing or bathing? N N  Doing errands, shopping? N N  Preparing Food and eating ? N -  Using the Toilet? N -  In the past six months, have you accidently leaked urine? Y -  Comment wears depends  -  Do you have problems with loss of bowel control? N -  Managing your Medications? N -  Managing your Finances? N -  Housekeeping or managing your Housekeeping? N -  Some recent data might be hidden    Patient Care Team: Gabriel Cirri, NP as PCP - General (Nurse Practitioner) Samson Frederic, MD as Consulting Physician (Orthopedic Surgery)    Assessment:     Exercise Activities and Dietary recommendations Current Exercise Habits: The patient does not participate in regular exercise at present, Exercise limited by:  orthopedic condition(s) (back surgery in May)  Goals    . Increase water intake          Recommend drinking at least 6-6 glasses of water a day      Fall Risk Fall Risk  05/04/2017 03/21/2017 07/17/2016  Falls in the past year? No No No   Depression Screen PHQ 2/9 Scores 05/04/2017 03/21/2017 09/25/2016 08/14/2016  PHQ - 2 Score 5 4 5 6   PHQ- 9 Score 21 23 20 19      Cognitive Function     6CIT Screen 05/04/2017  What Year? 0 points  What month? 0 points  What time? 0 points  Count back from 20 0 points  Months in reverse 0 points  Repeat phrase 0 points  Total Score 0    Immunization History  Administered Date(s) Administered  . Influenza, High Dose Seasonal PF 07/17/2016  . Influenza,inj,Quad PF,6+ Mos 05/15/2015  . Pneumococcal Conjugate-13 12/22/2013  . Pneumococcal Polysaccharide-23 12/22/2008, 12/27/2015  . Zoster 12/22/2013   Screening Tests Health Maintenance  Topic Date Due  . INFLUENZA VACCINE  04/04/2017  . MAMMOGRAM  08/04/2017 (Originally 01/15/2015)  . TETANUS/TDAP  03/21/2018 (Originally 03/05/1965)  . COLONOSCOPY  06/24/2023  . DEXA SCAN  Completed  . Hepatitis C Screening  Completed  . PNA vac Low Risk Adult  Completed      Plan:     I have personally reviewed and addressed the Medicare Annual Wellness questionnaire and have noted the following in the patient's chart:  A. Medical and social history B. Use of alcohol, tobacco or illicit drugs  C. Current medications and supplements D. Functional ability and status E.  Nutritional status F.  Physical activity G. Advance directives H. List of other physicians I.  Hospitalizations, surgeries, and ER visits in previous 12 months J.  Vitals K. Screenings such as hearing and vision if needed, cognitive and depression L. Referrals and appointments  In addition, I have reviewed and discussed with patient certain preventive protocols, quality metrics, and best practice recommendations. A written  personalized care plan for preventive services as well as general preventive health recommendations were provided to patient.   Signed,  Marin Roberts, LPN Nurse Health Advisor   MD Recommendations:none

## 2017-05-04 NOTE — Assessment & Plan Note (Signed)
Order written for a lift chair

## 2017-05-04 NOTE — Assessment & Plan Note (Signed)
Wants a lift chair.  Prescription written.

## 2017-05-04 NOTE — Progress Notes (Signed)
BP 107/71   Pulse 71   Temp (!) 97.4 F (36.3 C)   Ht 5\' 3"  (1.6 m)   Wt 182 lb 1.6 oz (82.6 kg)   LMP  (LMP Unknown)   SpO2 98%   BMI 32.26 kg/m    Subjective:    Patient ID: Renee Bridges, female    DOB: 1946/07/20, 71 y.o.   MRN: [id number]  HPI: Renee Bridges is a 71 y.o. female  Chief Complaint  Patient presents with  . Hypertension  . Depression    Depression States she was only depressed as her sister had a death in the family.  She states she is better and takes a Lexapro when she really needs it.  Therefore she has only taking it twice since I last saw her.  Depression screen [facility] 2/9 05/04/2017 03/21/2017 09/25/2016 08/14/2016 07/17/2016  Decreased Interest 3 3 3 3 3   Down, Depressed, Hopeless 2 1 2 3 3   PHQ - 2 Score 5 4 5 6 6   Altered sleeping 3 3 3 3 3   Tired, decreased energy 3 3 3 2 3   Change in appetite 3 3 3 2 3   Feeling bad or failure about yourself  2 3 2  0 3  Trouble concentrating 2 3 2 3  0  Moving slowly or fidgety/restless 3 2 2 3  0  Suicidal thoughts 0 2 0 0 0  PHQ-9 Score 21 23 20 19 18   Difficult doing work/chores Extremely dIfficult - - Very difficult -   Hypertension Using medications without difficulty Average home BPs Checking on occasion    No problems or lightheadedness No chest pain with exertion or shortness of breath No Edema  Lift chair Pt would like a lift chair.  She has a recliner but has trouble getting up due to leg weakness and s/p bilateral knee replacements.    Relevant past medical, surgical, family and social history reviewed and updated as indicated. Interim medical history since our last visit reviewed. Allergies and medications reviewed and updated.  Review of Systems  Per HPI unless specifically indicated above     Objective:    BP 107/71   Pulse 71   Temp (!) 97.4 F (36.3 C)   Ht 5\' 3"  (1.6 m)   Wt 182 lb 1.6 oz (82.6 kg)   LMP  (LMP Unknown)   SpO2 98%   BMI 32.26 kg/m   Wt Readings from Last 3  Encounters:  05/04/17 182 lb 1.6 oz (82.6 kg)  05/04/17 182 lb 1.6 oz (82.6 kg)  03/21/17 180 lb (81.6 kg)    Physical Exam  Constitutional: She is oriented to person, place, and time. She appears well-developed and well-nourished. No distress.  HENT:  Head: Normocephalic and atraumatic.  Eyes: Conjunctivae and lids are normal. Right eye exhibits no discharge. Left eye exhibits no discharge. No scleral icterus.  Neck: Normal range of motion. Neck supple. No JVD present. Carotid bruit is not present.  Cardiovascular: Normal rate, regular rhythm and normal heart sounds.   Pulmonary/Chest: Effort normal and breath sounds normal.  Abdominal: Normal appearance. There is no splenomegaly or hepatomegaly.  Musculoskeletal: Normal range of motion.  Neurological: She is alert and oriented to person, place, and time.  Skin: Skin is warm, dry and intact. No rash noted. No pallor.  Psychiatric: She has a normal mood and affect. Her behavior is normal. Judgment and thought content normal.    Results for orders placed or performed during the hospital encounter  of 01/10/17  Surgical pcr screen  Result Value Ref Range   MRSA, PCR NEGATIVE NEGATIVE   Staphylococcus aureus NEGATIVE NEGATIVE  CBC  Result Value Ref Range   WBC 7.6 4.0 - 10.5 K/uL   RBC 5.07 3.87 - 5.11 MIL/uL   Hemoglobin 15.8 (H) 12.0 - 15.0 g/dL   HCT 59.549.1 (H) 63.836.0 - 75.646.0 %   MCV 96.8 78.0 - 100.0 fL   MCH 31.2 26.0 - 34.0 pg   MCHC 32.2 30.0 - 36.0 g/dL   RDW 43.312.9 29.511.5 - 18.815.5 %   Platelets 211 150 - 400 K/uL  Basic metabolic panel  Result Value Ref Range   Sodium 139 135 - 145 mmol/L   Potassium 4.4 3.5 - 5.1 mmol/L   Chloride 103 101 - 111 mmol/L   CO2 27 22 - 32 mmol/L   Glucose, Bld 100 (H) 65 - 99 mg/dL   BUN 10 6 - 20 mg/dL   Creatinine, Ser 4.161.13 (H) 0.44 - 1.00 mg/dL   Calcium 9.7 8.9 - 60.610.3 mg/dL   GFR calc non Af Amer 47 (L) >60 mL/min   GFR calc Af Amer 55 (L) >60 mL/min   Anion gap 9 5 - 15      Assessment  & Plan:   Problem List Items Addressed This Visit      Unprioritized   Chronic back pain    Pt sees Dr. Gerilyn Nestleamon for her back pain.  She is getting another referral      Depression    No improvement.  Discussed medication should be used daily.  Restart Lexapro daily.  States she is depressed a lot as home alone.  Discussed going to assisted living.        Immobility    Wants a lift chair.  Prescription written.        Leg weakness    Order written for a lift chair          Follow up plan: Return in about 4 weeks (around 06/01/2017).

## 2017-05-25 ENCOUNTER — Other Ambulatory Visit: Payer: Self-pay

## 2017-05-25 MED ORDER — LISINOPRIL-HYDROCHLOROTHIAZIDE 10-12.5 MG PO TABS: 1.0000 | ORAL_TABLET | Freq: Every day | ORAL | 3 refills | Status: DC

## 2017-05-25 NOTE — Telephone Encounter (Signed)
Patient last seen 05/04/17 and has appointment 06/01/17.

## 2017-05-30 ENCOUNTER — Ambulatory Visit: Payer: Medicare Other | Attending: Physical Medicine and Rehabilitation | Admitting: Physical Therapy

## 2017-05-30 DIAGNOSIS — R2689 Other abnormalities of gait and mobility: Secondary | ICD-10-CM | POA: Diagnosis present

## 2017-05-30 DIAGNOSIS — M545 Low back pain: Secondary | ICD-10-CM | POA: Insufficient documentation

## 2017-05-30 DIAGNOSIS — G8929 Other chronic pain: Secondary | ICD-10-CM | POA: Diagnosis present

## 2017-05-31 NOTE — Therapy (Signed)
[facility] [address] [place], [place], [address] Phone: [phone]   Fax:  [phone]  Physical Therapy Evaluation  Patient Details  Name: Renee Bridges MRN: [id number] Date of Birth: [birth year] Referring Provider: Dr. Suella Broad  Encounter Date: 05/30/2017      PT End of Session - 05/31/17 2015    Visit Number 1   PT Start Time 1240   PT Stop Time 6503   PT Time Calculation (min) 72 min      Past Medical History:  Diagnosis Date  . Anxiety   . Arthritis   . Asthma   . Depression   . Headache   . History of hiatal hernia   . Hypertension   . Kidney cyst, acquired   . Seizures ([place])    hx. as child  . Shortness of breath dyspnea    with excertion    Past Surgical History:  Procedure Laterality Date  . ABDOMINAL HYSTERECTOMY    . BACK SURGERY    . colonoscopy a year ago    . LEG SURGERY Left 03/2015  . mass removed from back - 20 years ago    . SPINAL CORD STIMULATOR INSERTION N/A 01/17/2017   Procedure: LUMBAR SPINAL CORD STIMULATOR INSERTION;  Surgeon: Melina Schools, MD;  Location: [facility];  Service: Orthopedics;  Laterality: N/A;  Requests 3 hrs  . TONSILLECTOMY    . TOTAL KNEE REVISION Left 05/13/2015   Procedure: REVISION LEFT  KNEE ARTHROPLASTY, REVISION PATELLA POLY EXCHANGE;  Surgeon: Rod Can, MD;  Location: [facility];  Service: Orthopedics;  Laterality: Left;    There were no vitals filed for this visit.       Subjective Assessment - 05/31/17 2000    Subjective Pt amb. to PT eval with cane - is requesting power wheelchair evaluation   Currently in Pain? Yes   Pain Score 8    Pain Location Back   Pain Orientation Lower   Pain Descriptors / Indicators Aching;Penetrating;Grimacing   Pain Type Chronic pain   Pain Onset More than a month ago   Pain Frequency Constant            [facility] PT Assessment - 05/31/17 0001      Assessment   Medical Diagnosis Chronic back pain  with s/p spinal cord stimulator insertion in May 2018:     Referring Provider Dr. Suella Broad   Onset Date/Surgical Date --  May 2018              Mobility/Seating Evaluation    PATIENT INFORMATION: Name: Renee Bridges DOB: 11/08/1945  Sex: F Date seen: 05-30-17 Time: 1245  Address:  [address]  [place]                 [place], [place] [address]                  Physician: Suella Broad, MD This evaluation/justification form will serve as the LMN for the following suppliers: __________________________ Supplier: [employer] Contact Person: Felton Clinton, Wess Botts Phone:  [phone]   Seating Therapist: Guido Sander, PT Phone:   [phone]   Phone: [phone]    Spouse/Parent/Caregiver name: ?????  Phone number: ????? Insurance/Payer: [facility]     Reason for Referral: power wheelchair evaluation  Patient/Caregiver Goals: obtain power wheelchair for independence with mobility  Patient was seen for face-to-face evaluation for new power wheelchair.  Also present was [employer] to discuss recommendations and  wheelchair options.  Further paperwork was completed and sent to vendor.  Patient appears to qualify for power mobility device at this time per objective findings.   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   History including Falls: Pt reports no actual falls but states she has occurrences of stumbling daily due to Lt TKR:  pt is using single point cane for assistance with ambulation but reports severe back pain  which increases with weight-bearing     HOME ENVIRONMENT: '[]'$ House  '[]'$ Condo/town home  '[x]'$ Apartment  '[]'$ Assisted Living    '[]'$ Lives Alone '[]'$  Lives with Others                                                                                          Hours with caregiver: ?????  '[]'$ Home is accessible to patient           Stairs      '[]'$ Yes '[x]'$  No     Ramp '[]'$ Yes '[x]'$ No Comments:  ?????   COMMUNITY ADL: TRANSPORTATION: '[]'$ Car    '[]'$ Van    '[x]'$ Public Transportation    '[]'$ Adapted w/c Lift    '[]'$ Ambulance    '[]'$ Other:       '[]'$ Sits in wheelchair during transport  Employment/School: ????? Specific requirements pertaining to mobility ?????  Other: ?????    FUNCTIONAL/SENSORY PROCESSING SKILLS:  Handedness:   '[x]'$ Right     '[]'$ Left    '[]'$ NA  Comments:  ?????  Functional Processing Skills for Wheeled Mobility '[x]'$ Processing Skills are adequate for safe wheelchair operation  Areas of concern than may interfere with safe operation of wheelchair Description of problem   '[]'$  Attention to environment      '[]'$ Judgment      '[]'$  Hearing  '[]'$  Vision or visual processing      '[]'$ Motor Planning  '[]'$  Fluctuations in Behavior  ?????    VERBAL COMMUNICATION: '[x]'$ WFL receptive '[x]'$  WFL expressive '[]'$ Understandable  '[]'$ Difficult to understand  '[]'$ non-communicative '[]'$  Uses an augmented communication device  CURRENT SEATING / MOBILITY: Current Mobility Base:  '[x]'$ None '[]'$ Dependent '[]'$ Manual '[]'$ Scooter '[]'$ Power  Type of Control: ?????  Manufacturer:  ?????Size:  ?????Age: ?????  Current Condition of Mobility Base:  ?????   Current Wheelchair components:  ?????  Describe posture in present seating system:  ?????      SENSATION and SKIN ISSUES: Sensation '[]'$ Intact  '[x]'$ Impaired '[]'$ Absent  Level of sensation: pt reports tingling in bil. LE's which progressively worsens at night Pressure Relief: Able to perform effective pressure relief :    '[x]'$ Yes  '[]'$  No Method: ???? If not, Why?: ?????  Skin Issues/Skin Integrity Current Skin Issues  '[]'$ Yes  '[x]'$ No '[]'$ Intact '[]'$  Red area'[]'$  Open Area  '[]'$ Scar Tissue '[]'$ At risk from prolonged sitting Where  ?????  History  of Skin Issues  '[]'$ Yes '[x]'$ No Where  ????? When  ?????  Hx of skin flap surgeries  '[]'$ Yes '[x]'$ No Where  ????? When  ?????  Limited sitting tolerance '[x]'$ Yes '[]'$ No Hours spent sitting in wheelchair daily: Pt reports low back pain increases with prolonged sitting; states she changes position frequently during the day at home  Complaint of Pain:  Please describe: Pt has chronic low back pain: rates 8/10 at present time:  states pain increases to 10/10 with weight bearing and prolonged ambulation    Swelling/Edema: None   ADL STATUS (in reference to wheelchair use):  Indep Assist Unable Indep with Equip Not assessed Comments  Dressing ????? ????? ????? X ????? Performs from seated position  Eating X ????? ????? ????? ????? ?????  Toileting X ????? ????? ????? ????? wears Depends  Bathing ????? ????? ????? X ????? grab bars in shower; emergency system installed in apartment  Grooming/Hygiene X ????? ????? ????? ????? ?????  Meal Prep ????? ????? ????? X ????? pt sits frequently to relieve back pain  IADLS ????? ????? ????? X ????? currently using SPC but reports excruciating pain with prolonged ambulation  Bowel Management: '[x]'$ Continent  '[]'$ Incontinent  '[]'$ Accidents Comments:  ?????  Bladder Management: '[]'$ Continent  '[]'$ Incontinent  '[x]'$ Accidents Comments:  wears Depends     WHEELCHAIR SKILLS: Manual w/c Propulsion: '[]'$ UE or LE strength and endurance sufficient to participate in ADLs using manual wheelchair Arm : '[]'$ left '[]'$ right   '[]'$ Both      Distance: ????? Foot:  '[]'$ left '[]'$ right   '[]'$ Both  Operate Scooter: '[]'$  Strength, hand grip, balance and transfer appropriate for use '[]'$ Living environment is accessible for use of scooter  Operate Power w/c:  '[x]'$  Std. Joystick   '[]'$  Alternative Controls Indep '[x]'$  Assist '[]'$  Dependent/unable '[]'$  N/A '[]'$   '[x]'$ Safe          '[x]'$  Functional      Distance: ?????   Bed confined without wheelchair '[]'$  Yes '[x]'$  No   STRENGTH/RANGE OF MOTION:  Active Range of Motion Strength  Shoulder WFL's for bil. UE's 4/5 bil. UE's with c/o pain in shoulders with resistance  Elbow WFL's for bil. UE's WFL's bil. UE's  Wrist/Hand WFL's bil. UE's WFL's bil. UE's  Hip WFL's for bil. LE's WFL's with c/o pain with resistance - in bil. LE's  Knee WFL's for bil. LE's WFL's bil. LE's  Ankle WFL's for bil. LE's WFL's bil. LE's     MOBILITY/BALANCE:  '[]'$  Patient is totally dependent for mobility  ?????    Balance Transfers Ambulation  Sitting Balance: Standing Balance: '[x]'$  Independent '[]'$  Independent/Modified Independent  '[x]'$  WFL     '[]'$  WFL '[]'$  Supervision '[]'$  Supervision  '[]'$  Uses UE for balance  '[x]'$  Supervision '[]'$  Min Assist '[]'$  Ambulates with Assist  ?????    '[]'$  Min Assist '[]'$  Min assist '[]'$  Mod Assist '[x]'$  Ambulates with Device:      '[]'$  RW  '[]'$  StW  '[x]'$  Cane  '[]'$  ?????  '[]'$  Mod Assist '[]'$  Mod assist '[]'$  Max assist   '[]'$  Max Assist '[]'$  Max assist '[]'$  Dependent '[]'$  Indep. Short Distance Only  '[]'$  Unable '[]'$  Unable '[]'$  Lift / Sling Required Distance (in feet)  ?????   '[]'$  Sliding board '[]'$  Unable to Ambulate (see explanation below)  Cardio Status:  '[]'$ Intact  '[]'$  Impaired   '[]'$  NA     ?????  Respiratory Status:  '[]'$ Intact   '[x]'$ Impaired   '[]'$ NA     Pt has asthma and also has dyspnea with exertion  Orthotics/Prosthetics: None  Comments (Address manual vs power w/c vs scooter): Pt is unable to effectively and functionally propel a manual wheelchair due to chronic back pain with pt having spinal cord stimulator implant for pain management (surgical insertion on 01-17-17).  Pt also unable to propel a manual wheelchair due to c/o pain in shoulders with resistance.  Pt has dyspnea with exertion and pt also has asthma.  Pt's Timed Up and Go score = 19.28 secs with pt using single point cane and relying heavily on cane due to increased back pain with weight-bearing/ambulation.  This score of 19.28 secs is  indicative of fall risk as score is > 13.5 secs.  Pt's BP prior to ambulating for TUG test was 116/78; BP recorded after this test was 129/87 with pt reporting significantly increased back pain (9-10/10 intensity).  BP appears to increase significantly with ambulation/weight-bearing due to increased back pain.          Anterior / Posterior Obliquity Rotation-Pelvis ?????  PELVIS    '[]'$  '[x]'$  '[]'$   Neutral Posterior Anterior  '[x]'$  '[]'$  '[]'$   WFL Rt elev Lt elev  '[x]'$  '[]'$  '[]'$   WFL Right Left                      Anterior    Anterior     '[]'$  Fixed '[]'$  Other '[]'$  Partly Flexible '[x]'$  Flexible   '[]'$  Fixed '[]'$  Other '[]'$  Partly Flexible  '[x]'$  Flexible  '[]'$  Fixed '[]'$  Other '[]'$  Partly Flexible  '[x]'$  Flexible   TRUNK  '[]'$  '[x]'$  '[]'$   WFL ? Thoracic ? Lumbar  Kyphosis Lordosis  '[x]'$  '[]'$  '[]'$   WFL Convex Convex  Right Left '[]'$ c-curve '[]'$ s-curve '[]'$ multiple  '[x]'$  Neutral '[]'$  Left-anterior '[]'$  Right-anterior     '[]'$  Fixed '[]'$  Flexible '[x]'$  Partly Flexible '[]'$  Other  '[]'$  Fixed '[x]'$  Flexible '[]'$  Partly Flexible '[]'$  Other  '[]'$  Fixed             '[x]'$  Flexible '[]'$  Partly Flexible '[]'$  Other    Position Windswept  ?????  HIPS          '[x]'$            '[]'$               '[]'$    Neutral       Abduct        ADduct         '[x]'$           '[]'$            '[]'$   Neutral Right           Left      '[]'$  Fixed '[]'$  Subluxed '[]'$  Partly Flexible '[]'$  Dislocated '[x]'$  Flexible  '[]'$  Fixed '[]'$  Other '[]'$  Partly Flexible  '[x]'$  Flexible                 Foot Positioning Knee Positioning  ?????    '[x]'$  WFL  '[x]'$ Lt '[x]'$ Rt '[x]'$  WFL  '[x]'$ Lt '[x]'$ Rt    KNEES ROM concerns: ROM concerns:    & Dorsi-Flexed '[]'$ Lt '[]'$ Rt ?????    FEET Plantar Flexed '[]'$ Lt '[]'$ Rt      Inversion                 '[]'$ Lt '[]'$ Rt      Eversion                 '[]'$ Lt '[]'$ Rt     HEAD '[x]'$  Functional '[x]'$  Good Head Control  ?????  & '[]'$   Flexed         '[]'$  Extended '[]'$  Adequate Head Control    NECK '[]'$  Rotated  Lt  '[]'$  Lat Flexed Lt '[]'$  Rotated  Rt '[]'$  Lat Flexed Rt '[]'$  Limited Head Control     '[]'$  Cervical Hyperextension '[]'$  Absent  Head  Control     SHOULDERS ELBOWS WRIST& HAND ?????      Left     Right    Left     Right    Left     Right   U/E '[x]'$ Functional           '[x]'$ Functional WFL's WFL's '[]'$ Fisting             '[]'$ Fisting      '[]'$ elev   '[]'$ dep      '[]'$ elev   '[]'$ dep       '[]'$ pro -'[]'$ retract     '[]'$ pro  '[]'$ retract '[]'$ subluxed             '[]'$ subluxed           Goals for Wheelchair Mobility  '[x]'$  Independence with mobility in the home with motor related ADLs (MRADLs)  '[x]'$  Independence with MRADLs in the community '[]'$  Provide dependent mobility  '[]'$  Provide recline     '[]'$ Provide tilt   Goals for Seating system '[x]'$  Optimize pressure distribution '[x]'$  Provide support needed to facilitate function or safety '[]'$  Provide corrective forces to assist with maintaining or improving posture '[]'$  Accommodate client's posture:   current seated postures and positions are not flexible or will not tolerate corrective forces '[]'$  Client to be independent with relieving pressure in the wheelchair '[]'$ Enhance physiological function such as breathing, swallowing, digestion  Simulation ideas/Equipment trials:????? State why other equipment was unsuccessful:?????   MOBILITY BASE RECOMMENDATIONS and JUSTIFICATION: MOBILITY COMPONENT JUSTIFICATION  Manufacturer: Mer[employer]Model: Junior   Size: Width 18Seat Depth 16 '[x]'$ provide transport from point A to B      '[x]'$ promote Indep mobility  '[x]'$ is not a safe, functional ambulator '[x]'$ walker or cane inadequate '[]'$ non-standard width/depth necessary to accommodate anatomical measurement '[]'$  ?????  '[]'$ Manual Mobility Base '[]'$ non-functional ambulator    '[]'$ Scooter/POV  '[]'$ can safely operate  '[]'$ can safely transfer   '[]'$ has adequate trunk stability  '[]'$ cannot functionally propel manual w/c  '[x]'$ Power Mobility Base  '[]'$ non-ambulatory  '[x]'$ cannot functionally propel manual wheelchair  '[x]'$  cannot functionally and safely operate scooter/POV '[x]'$ can safely operate and willing to  '[]'$ Stroller Base '[]'$ infant/child  '[]'$ unable to propel manual  wheelchair '[]'$ allows for growth '[]'$ non-functional ambulator '[]'$ non-functional UE '[]'$ Indep mobility is not a goal at this time  '[]'$ Tilt  '[]'$ Forward '[]'$ Backward '[]'$ Powered tilt  '[]'$ Manual tilt  '[]'$ change position against gravitational force on head and shoulders  '[]'$ change position for pressure relief/cannot weight shift '[]'$ transfers  '[]'$ management of tone '[]'$ rest periods '[]'$ control edema '[]'$ facilitate postural control  '[]'$  ?????  '[]'$ Recline  '[]'$ Power recline on power base '[]'$ Manual recline on manual base  '[]'$ accommodate femur to back angle  '[]'$ bring to full recline for ADL care  '[]'$ change position for pressure relief/cannot weight shift '[]'$ rest periods '[]'$ repositioning for transfers or clothing/diaper /catheter changes '[]'$ head positioning  '[]'$ Lighter weight required '[]'$ self- propulsion  '[]'$ lifting '[]'$  ?????  '[]'$ Heavy Duty required '[]'$ user weight greater than 250# '[]'$ extreme tone/ over active movement '[]'$ broken frame on previous chair '[]'$  ?????  '[x]'$  Back  '[]'$  Angle Adjustable '[]'$  Custom molded Captain's Seat '[]'$ postural control '[]'$ control of tone/spasticity '[]'$ accommodation of range of motion '[]'$ UE functional control '[]'$ accommodation for seating system '[]'$  ????? '[]'$ provide lateral trunk support '[]'$ accommodate deformity '[x]'$ provide posterior trunk support '[x]'$ provide lumbar/sacral support [  x]support trunk in midline '[x]'$ Pressure relief over spinal processes  '[x]'$  Seat Cushion Captain's Seat '[]'$ impaired sensation  '[]'$ decubitus ulcers present '[]'$ history of pressure ulceration '[]'$ prevent pelvic extension '[x]'$ low maintenance  '[]'$ stabilize pelvis  '[]'$ accommodate obliquity '[]'$ accommodate multiple deformity '[]'$ neutralize lower extremity position '[x]'$ increase pressure distribution '[]'$  ?????  '[]'$  Pelvic/thigh support  '[]'$  Lateral thigh guide '[]'$  Distal medial pad  '[]'$  Distal lateral pad '[]'$  pelvis in neutral '[]'$ accommodate pelvis '[]'$  position upper legs '[]'$  alignment '[]'$  accommodate ROM '[]'$  decr adduction '[]'$ accommodate tone '[]'$ removable for  transfers '[]'$ decr abduction  '[]'$  Lateral trunk Supports '[]'$  Lt     '[]'$  Rt '[]'$ decrease lateral trunk leaning '[]'$ control tone '[]'$ contour for increased contact '[]'$ safety  '[]'$ accommodate asymmetry '[]'$  ?????  '[]'$  Mounting hardware  '[]'$ lateral trunk supports  '[]'$ back   '[]'$ seat '[]'$ headrest      '[]'$  thigh support '[]'$ fixed   '[]'$ swing away '[]'$ attach seat platform/cushion to w/c frame '[]'$ attach back cushion to w/c frame '[]'$ mount postural supports '[]'$ mount headrest  '[]'$ swing medial thigh support away '[]'$ swing lateral supports away for transfers  '[]'$  ?????    Armrests  '[]'$ fixed '[x]'$ adjustable height '[]'$ removable   '[]'$ swing away  '[x]'$ flip back   '[]'$ reclining '[x]'$ full length pads '[]'$ desk    '[]'$ pads tubular  '[x]'$ provide support with elbow at 90   '[]'$ provide support for w/c tray '[x]'$ change of height/angles for variable activities '[x]'$ remove for transfers '[]'$ allow to come closer to table top '[x]'$ remove for access to tables '[]'$  ?????  Hangers/ Leg rests  '[]'$ 60 '[]'$ 70 '[]'$ 90 '[]'$ elevating '[]'$ heavy duty  '[]'$ articulating '[]'$ fixed '[]'$ lift off '[]'$ swing away     '[]'$ power '[]'$ provide LE support  '[]'$ accommodate to hamstring tightness '[]'$ elevate legs during recline   '[]'$ provide change in position for Legs '[]'$ Maintain placement of feet on footplate '[]'$ durability '[]'$ enable transfers '[]'$ decrease edema '[]'$ Accommodate lower leg length '[]'$  ?????  Foot support Footplate    '[]'$ Lt  '[]'$  Rt  '[x]'$  Center mount '[x]'$ flip up     '[x]'$ depth/angle adjustable '[]'$ Amputee adapter    '[]'$  Lt     '[]'$  Rt '[x]'$ provide foot support '[x]'$ accommodate to ankle ROM '[x]'$ transfers '[]'$ Provide support for residual extremity '[]'$  allow foot to go under wheelchair base '[]'$  decrease tone  '[]'$  ?????  '[]'$  Ankle strap/heel loops '[]'$ support foot on foot support '[]'$ decrease extraneous movement '[]'$ provide input to heel  '[]'$ protect foot  Tires: '[]'$ pneumatic  '[x]'$ flat free inserts  '[]'$ solid  '[x]'$ decrease maintenance  '[x]'$ prevent frequent flats '[]'$ increase shock absorbency '[]'$ decrease pain from road shock '[]'$ decrease spasms from  road shock '[]'$  ?????  '[]'$  Headrest  '[]'$ provide posterior head support '[]'$ provide posterior neck support '[]'$ provide lateral head support '[]'$ provide anterior head support '[]'$ support during tilt and recline '[]'$ improve feeding   '[]'$ improve respiration '[]'$ placement of switches '[]'$ safety  '[]'$ accommodate ROM  '[]'$ accommodate tone '[]'$ improve visual orientation  '[]'$  Anterior chest strap '[]'$  Vest '[]'$  Shoulder retractors  '[]'$ decrease forward movement of shoulder '[]'$ accommodation of TLSO '[]'$ decrease forward movement of trunk '[]'$ decrease shoulder elevation '[]'$ added abdominal support '[]'$ alignment '[]'$ assistance with shoulder control  '[]'$  ?????  Pelvic Positioner '[x]'$ Belt '[]'$ SubASIS bar '[]'$ Dual Pull '[]'$ stabilize tone '[x]'$ decrease falling out of chair/ **will not Decr potential for sliding due to pelvic tilting '[]'$ prevent excessive rotation '[]'$ pad for protection over boney prominence '[]'$ prominence comfort '[]'$ special pull angle to control rotation '[]'$  ?????  Upper Extremity Support '[]'$ L   '[]'$  R '[]'$ Arm trough    '[]'$ hand support '[]'$  tray       '[]'$ full tray '[]'$ swivel mount '[]'$ decrease edema      '[]'$ decrease subluxation   '[]'$ control tone   '[]'$ placement for AAC/Computer/EADL '[]'$ decrease gravitational pull on shoulders '[]'$ provide midline positioning '[]'$   provide support to increase UE function '[]'$ provide hand support in natural position '[]'$ provide work surface   POWER WHEELCHAIR CONTROLS  '[x]'$ Proportional  '[]'$ Non-Proportional Type Joystick '[]'$ Left  '[x]'$ Right '[x]'$ provides access for controlling wheelchair   '[]'$ lacks motor control to operate proportional drive control '[]'$ unable to understand proportional controls  Actuator Control Module  '[]'$ Single  '[]'$ Multiple   '[]'$ Allow the client to operate the power seat function(s) through the joystick control   '[]'$ Safety Reset Switches '[]'$ Used to change modes and stop the wheelchair when driving in latch mode    '[]'$ Upgraded Electronics   '[]'$ programming for accurate control '[]'$ progressive Disease/changing  condition '[]'$ non-proportional drive control needed '[]'$ Needed in order to operate power seat functions through joystick control   '[]'$ Display box '[]'$ Allows user to see in which mode and drive the wheelchair is set  '[]'$ necessary for alternate controls    '[]'$ Digital interface electronics '[]'$ Allows w/c to operate when using alternative drive controls  '[]'$ ASL Head Array '[]'$ Allows client to operate wheelchair  through switches placed in tri-panel headrest  '[]'$ Sip and puff with tubing kit '[]'$ needed to operate sip and puff drive controls  '[]'$ Upgraded tracking electronics '[]'$ increase safety when driving '[]'$ correct tracking when on uneven surfaces  '[x]'$ Mount for switches or joystick '[]'$ Attaches switches to w/c  '[x]'$ Swing away for access or transfers '[]'$ midline for optimal placement '[]'$ provides for consistent access  '[]'$ Attendant controlled joystick plus mount '[]'$ safety '[]'$ long distance driving '[]'$ operation of seat functions '[]'$ compliance with transportation regulations '[]'$  ?????    Rear wheel placement/Axle adjustability '[]'$ None '[]'$ semi adjustable '[]'$ fully adjustable  '[]'$ improved UE access to wheels '[]'$ improved stability '[]'$ changing angle in space for improvement of postural stability '[]'$ 1-arm drive access '[]'$ amputee pad placement '[]'$  ?????  Wheel rims/ hand rims  '[]'$ metal  '[]'$ plastic coated '[]'$ oblique projections '[]'$ vertical projections '[]'$ Provide ability to propel manual wheelchair  '[]'$  Increase self-propulsion with hand weakness/decreased grasp  Push handles '[]'$ extended  '[]'$ angle adjustable  '[]'$ standard '[]'$ caregiver access '[]'$ caregiver assist '[]'$ allows "hooking" to enable increased ability to perform ADLs or maintain balance  One armed device  '[]'$ Lt   '[]'$ Rt '[]'$ enable propulsion of manual wheelchair with one arm   '[]'$  ?????   Brake/wheel lock extension '[]'$  Lt   '[]'$  Rt '[]'$ increase indep in applying wheel locks   '[]'$ Side guards '[]'$ prevent clothing getting caught in wheel or becoming soiled '[]'$  prevent skin tears/abrasions  Battery: 12-18 AH x2  '[x]'$ to power wheelchair ?????  Other: ????? ????? ?????  The above equipment has a life- long use expectancy. Growth and changes in medical and/or functional conditions would be the exceptions. This is to certify that the therapist has no financial relationship with durable medical provider or manufacturer. The therapist will not receive remuneration of any kind for the equipment recommended in this evaluation.   Patient has mobility limitation that significantly impairs safe, timely participation in one or more mobility related ADL's.  (bathing, toileting, feeding, dressing, grooming, moving from room to room)                                                             '[x]'$  Yes '[]'$  No Will mobility device sufficiently improve ability to participate and/or be aided in participation of MRADL's?         '[x]'$  Yes '[]'$  No Can limitation be compensated for with use of a cane or walker?                                                                                '[]'$   Yes '[x]'$  No Does patient or caregiver demonstrate ability/potential ability & willingness to safely use the mobility device?   '[x]'$  Yes '[]'$  No Does patient's home environment support use of recommended mobility device?                                                    '[x]'$  Yes '[]'$  No Does patient have sufficient upper extremity function necessary to functionally propel a manual wheelchair?    '[]'$  Yes '[x]'$  No Does patient have sufficient strength and trunk stability to safely operate a POV (scooter)?                                  '[]'$  Yes '[x]'$  No Does patient need additional features/benefits provided by a power wheelchair for MRADL's in the home?       '[x]'$  Yes '[]'$  No Does the patient demonstrate the ability to safely use a power wheelchair?                                                              '[x]'$  Yes '[]'$  No  Therapist Name Printed: Guido Sander, PT Date: 05-30-17  Therapist's Signature:   Date:   Supplier's Name Printed: [employer] Date:  05-30-17  Supplier's Signature:   Date:  Patient/Caregiver Signature:   Date:     This is to certify that I have read this evaluation and do agree with the content within:      Physician's Name Printed: Suella Broad, MD  Physician's Signature:  Date:     This is to certify that I, the above signed therapist have the following affiliations: '[]'$  This DME provider '[]'$  Manufacturer of recommended equipment '[]'$  Patient's long term care facility '[x]'$  None of the above                                Plan - 05/31/17 2016    Clinical Impression Statement Pt is a 71 yr old lady with chronic back pain with s/p spinal cord stimulator insertion in May 2018: pt evaluated for power wheelchair with Josh Cadle, [facility] from [facility]; questionable if pt will qualify for power wheelchair due to pt able to amb. in home with use of SPC   PT Frequency One time visit   Consulted and Agree with Plan of Care Patient      Patient will benefit from skilled therapeutic intervention in order to improve the following deficits and impairments:     Visit Diagnosis: Chronic bilateral low back pain without sciatica - Plan: PT plan of care cert/re-cert  Other abnormalities of gait and mobility - Plan: PT plan of care cert/re-cert      G-Codes - 16/10/96 22-Nov-2018    Functional Assessment Tool Used (Outpatient Only) TUG score 19.2 secs; pt amb. with SPC - with c/o back pain   Functional Limitation Mobility: Walking and moving around   Mobility: Walking and Moving Around Current Status (E4540) At least 40 percent but less than 60 percent impaired,  limited or restricted   Mobility: Walking and Moving Around Goal Status [phone]) At least 40 percent but less than 60 percent impaired, limited or restricted   Mobility: Walking and Moving Around Discharge Status [phone]) At least 40 percent but less than 60 percent impaired, limited or restricted       Problem List Patient Active Problem List    Diagnosis Date Noted  . Leg weakness 05/04/2017  . Chronic kidney disease, stage 3 03/21/2017  . Back pain 01/17/2017  . Chronic back pain 09/25/2016  . Immobility 09/25/2016  . Cyst of right kidney 07/17/2016  . Constipation 07/17/2016  . Chronic fatigue 12/27/2015  . Essential hypertension 12/27/2015  . Failed total left knee replacement ([place]) 05/13/2015  . Depression 03/25/2015  . Anxiety 03/25/2015  . Urge incontinence 06/30/2013    Alda Lea, PT 05/31/2017, 8:26 PM  [facility] [address] [place], [place], [address] Phone: [phone]   Fax:  [phone]  Name: Renee Bridges MRN: [id number] Date of Birth: 01/14/1946

## 2017-06-01 ENCOUNTER — Ambulatory Visit (INDEPENDENT_AMBULATORY_CARE_PROVIDER_SITE_OTHER): Payer: Medicare Other | Admitting: Unknown Physician Specialty

## 2017-06-01 ENCOUNTER — Encounter: Payer: Self-pay | Admitting: Unknown Physician Specialty

## 2017-06-01 DIAGNOSIS — Z7409 Other reduced mobility: Secondary | ICD-10-CM

## 2017-06-01 DIAGNOSIS — F322 Major depressive disorder, single episode, severe without psychotic features: Secondary | ICD-10-CM | POA: Diagnosis not present

## 2017-06-01 DIAGNOSIS — R1111 Vomiting without nausea: Secondary | ICD-10-CM

## 2017-06-01 DIAGNOSIS — Z7189 Other specified counseling: Secondary | ICD-10-CM | POA: Diagnosis not present

## 2017-06-01 NOTE — Assessment & Plan Note (Signed)
Working with PT.

## 2017-06-01 NOTE — Assessment & Plan Note (Signed)
Long discussion about depression.  Pt agrees to take medication if it helps her to be more satisfied with life.

## 2017-06-01 NOTE — Assessment & Plan Note (Signed)
A voluntary discussion about advance care planning including the explanation and discussion of advance directives was extensively discussed  with the patient.  Explanation about the health care proxy and Living will was reviewed and packet with forms with explanation of how to fill them out was given.  During this discussion, the patient was able to identify a health care proxy as her daughter Charlton Amor and has filled out the paperwork required.  Patient was offered a separate Advance Care Planning visit for further assistance with forms.

## 2017-06-01 NOTE — Progress Notes (Signed)
BP 102/62   Pulse 71   Temp 97.7 F (36.5 C)   Wt 179 lb 12.8 oz (81.6 kg)   LMP  (LMP Unknown)   SpO2 94%   BMI 31.85 kg/m    Subjective:    Patient ID: Renee Bridges, female    DOB: December 03, 1945, 71 y.o.   MRN: [id number]  HPI: Renee Bridges is a 71 y.o. female  Chief Complaint  Patient presents with  . Depression    4 week f/up    Depression Pt is here to f/u of depression.  Pt feels she is doing better.  She does get depressed due to her kids not visiting.  Pt not taking Escitalpram daily.  She states she feels she should get over this on her own.   Depression screen [facility] 2/9 06/01/2017 05/04/2017 03/21/2017 09/25/2016 08/14/2016  Decreased Interest Down, Depressed, Hopeless PHQ - 2 Score Altered sleeping Tired, decreased energy Change in appetite Feeling bad or failure about yourself  0  Trouble concentrating Moving slowly or fidgety/restless Suicidal thoughts 0 0 2 0 0  PHQ-9 Score Difficult doing work/chores - Extremely dIfficult - - Very difficult   Mobility Pt states her biggest problem is lack of ability to get around.  Seeing PT and working on getting approval for assistive devices.    Vomiting Pt states she has not felt well.  States last week she vomited "every time" she ate something.  She is better, but last night the same thing happened.  No diarrhea but hard and painful stools.    Relevant past medical, surgical, family and social history reviewed and updated as indicated. Interim medical history since our last visit reviewed. Allergies and medications reviewed and updated.  Review of Systems  Per HPI unless specifically indicated above     Objective:    BP 102/62   Pulse 71   Temp 97.7 F (36.5 C)   Wt 179 lb 12.8 oz (81.6 kg)   LMP  (LMP Unknown)   SpO2 94%   BMI 31.85 kg/m   Wt Readings from Last 3 Encounters:  06/01/17 179 lb  12.8 oz (81.6 kg)  05/04/17 182 lb 1.6 oz (82.6 kg)  05/04/17 182 lb 1.6 oz (82.6 kg)    Physical Exam  Constitutional: She is oriented to person, place, and time. She appears well-developed and well-nourished. No distress.  HENT:  Head: Normocephalic and atraumatic.  Eyes: Conjunctivae and lids are normal. Right eye exhibits no discharge. Left eye exhibits no discharge. No scleral icterus.  Cardiovascular: Normal rate.   Pulmonary/Chest: Effort normal.  Abdominal: Normal appearance. There is no splenomegaly or hepatomegaly.  Musculoskeletal: Normal range of motion.  Neurological: She is alert and oriented to person, place, and time.  Skin: Skin is intact. No rash noted. No pallor.  Psychiatric: She has a normal mood and affect. Her behavior is normal. Judgment and thought content normal.      Assessment & Plan:   Problem List Items Addressed This Visit      Unprioritized   Advanced care planning/counseling discussion    A voluntary discussion about advance care planning including the explanation and  discussion of advance directives was extensively discussed  with the patient.  Explanation about the health care proxy and Living will was reviewed and packet with forms with explanation of how to fill them out was given.  During this discussion, the patient was able to identify a health care proxy as her daughter Renee Bridges and has filled out the paperwork required.  Patient was offered a separate Advance Care Planning visit for further assistance with forms.         Depression    Long discussion about depression.  Pt agrees to take medication if it helps her to be more satisfied with life.        Immobility    Working with PT.         Other Visit Diagnoses    Vomiting without nausea, intractability of vomiting not specified, unspecified vomiting type       This seems to be improving.  Check CBC and CMP   Relevant Orders   CBC with Differential/Platelet   Comprehensive metabolic  panel       Follow up plan: Return in about 4 weeks (around 06/29/2017).

## 2017-06-02 LAB — COMPREHENSIVE METABOLIC PANEL
A/G RATIO: 1.8 (ref 1.2–2.2)
ALT: 15 IU/L (ref 0–32)
AST: 24 IU/L (ref 0–40)
Albumin: 4.4 g/dL (ref 3.5–4.8)
Alkaline Phosphatase: 82 IU/L (ref 39–117)
BILIRUBIN TOTAL: 0.5 mg/dL (ref 0.0–1.2)
BUN/Creatinine Ratio: 11 — ABNORMAL LOW (ref 12–28)
BUN: 14 mg/dL (ref 8–27)
CHLORIDE: 96 mmol/L (ref 96–106)
CO2: 25 mmol/L (ref 20–29)
Calcium: 9.8 mg/dL (ref 8.7–10.3)
Creatinine, Ser: 1.25 mg/dL — ABNORMAL HIGH (ref 0.57–1.00)
GFR calc Af Amer: 49 mL/min/{1.73_m2} — ABNORMAL LOW (ref >59)
GFR calc non Af Amer: 43 mL/min/{1.73_m2} — ABNORMAL LOW (ref >59)
Globulin, Total: 2.5 g/dL (ref 1.5–4.5)
Glucose: 89 mg/dL (ref 65–99)
POTASSIUM: 4.5 mmol/L (ref 3.5–5.2)
Sodium: 139 mmol/L (ref 134–144)
Total Protein: 6.9 g/dL (ref 6.0–8.5)

## 2017-06-02 LAB — CBC WITH DIFFERENTIAL/PLATELET
BASOS ABS: 0 10*3/uL (ref 0.0–0.2)
BASOS: 0 %
EOS (ABSOLUTE): 0.1 10*3/uL (ref 0.0–0.4)
Eos: 2 %
Hematocrit: 47.3 % — ABNORMAL HIGH (ref 34.0–46.6)
Hemoglobin: 15.3 g/dL (ref 11.1–15.9)
IMMATURE GRANULOCYTES: 0 %
Immature Grans (Abs): 0 10*3/uL (ref 0.0–0.1)
Lymphocytes Absolute: 2.8 10*3/uL (ref 0.7–3.1)
Lymphs: 34 %
MCH: 31 pg (ref 26.6–33.0)
MCHC: 32.3 g/dL (ref 31.5–35.7)
MCV: 96 fL (ref 79–97)
MONOS ABS: 0.7 10*3/uL (ref 0.1–0.9)
Monocytes: 9 %
NEUTROS ABS: 4.5 10*3/uL (ref 1.4–7.0)
NEUTROS PCT: 55 %
Platelets: 239 10*3/uL (ref 150–379)
RBC: 4.93 x10E6/uL (ref 3.77–5.28)
RDW: 13.2 % (ref 12.3–15.4)
WBC: 8.2 10*3/uL (ref 3.4–10.8)

## 2017-06-04 ENCOUNTER — Encounter: Payer: Self-pay | Admitting: Unknown Physician Specialty

## 2017-06-07 ENCOUNTER — Telehealth: Payer: Self-pay | Admitting: Unknown Physician Specialty

## 2017-06-07 NOTE — Telephone Encounter (Signed)
Patient calling to check on status of lipitor medication. Patient would like to know if medication for lipitor has been approved.  Please Advise  Thank you

## 2017-06-08 MED ORDER — ATORVASTATIN CALCIUM 20 MG PO TABS: 20.0000 mg | ORAL_TABLET | Freq: Every day | ORAL | 3 refills | Status: DC

## 2017-06-08 NOTE — Telephone Encounter (Signed)
Called and spoke with Barbara Cower at [facility]. He states that they did received our order and that it was sent over to the [employer]. Barbara Cower states that it goes to them and they contact the patient to come in and pick out which chair they would like. I explained that the patient states that she has not gotten a phone call form anyone about her chair. Barbara Cower states he is going to send an email to [place] at the [employer] so she can reach out to the patient. Barbara Cower states that Jasmine December is usually pretty quick about getting in touch with patients. Verified the patient's phone number with Barbara Cower and I will call to notify the patient that she should expect a call from [place] about her chair.

## 2017-06-08 NOTE — Telephone Encounter (Signed)
I do not see Lipitor in the patient's med list and no documentation of starting this medication. Elnita Maxwell, do you know anything about this?

## 2017-06-08 NOTE — Telephone Encounter (Signed)
The 10-year ASCVD risk score Denman George DC Montez Hageman., et al., 2013) is: 11.7%   Values used to calculate the score:     Age: 71 years     Sex: Female     Is Non-Hispanic African American: No     Diabetic: No     Tobacco smoker: No     Systolic Blood Pressure: 102 mmHg     Is BP treated: Yes     HDL Cholesterol: 41 mg/dL     Total Cholesterol: 233 mg/dL  Based on her risk score, Atorvastatin is appropriate.  I will go ahead and write it.

## 2017-06-08 NOTE — Telephone Encounter (Signed)
Called and explained to the patient what was going on with her lift chair. I let her know that she should be expecting a call from [facility] about this. Patient states that they were calling her right now.

## 2017-06-08 NOTE — Telephone Encounter (Signed)
Called and let patient know about the atorvastatin being sent in for her. Patient states that she has not heard anything about a lift chair yet. Order was faxed to [facility] last Friday 06/01/17. Will call [facility] and see what is going on with this.

## 2017-07-03 ENCOUNTER — Ambulatory Visit: Payer: Medicare Other | Admitting: Unknown Physician Specialty

## 2017-07-10 ENCOUNTER — Encounter: Payer: Self-pay | Admitting: Unknown Physician Specialty

## 2017-07-10 ENCOUNTER — Ambulatory Visit (INDEPENDENT_AMBULATORY_CARE_PROVIDER_SITE_OTHER): Payer: Medicare Other | Admitting: Unknown Physician Specialty

## 2017-07-10 VITALS — BP 96/68 | HR 81 | Temp 98.0°F | Wt 178.4 lb

## 2017-07-10 DIAGNOSIS — R1110 Vomiting, unspecified: Secondary | ICD-10-CM | POA: Insufficient documentation

## 2017-07-10 DIAGNOSIS — R1111 Vomiting without nausea: Secondary | ICD-10-CM | POA: Diagnosis not present

## 2017-07-10 DIAGNOSIS — F322 Major depressive disorder, single episode, severe without psychotic features: Secondary | ICD-10-CM | POA: Diagnosis not present

## 2017-07-10 DIAGNOSIS — N183 Chronic kidney disease, stage 3 unspecified: Secondary | ICD-10-CM

## 2017-07-10 DIAGNOSIS — I10 Essential (primary) hypertension: Secondary | ICD-10-CM | POA: Diagnosis not present

## 2017-07-10 DIAGNOSIS — Z748 Other problems related to care provider dependency: Secondary | ICD-10-CM

## 2017-07-10 MED ORDER — ESCITALOPRAM OXALATE 10 MG PO TABS: 10.0000 mg | ORAL_TABLET | Freq: Every day | ORAL | 2 refills | Status: DC

## 2017-07-10 NOTE — Assessment & Plan Note (Signed)
Episodic.  Stop BP medication.  Increase Lexapro.  Recheck in 1 month

## 2017-07-10 NOTE — Assessment & Plan Note (Signed)
BP is low normal with dizzyness.  DC Lisonopril/HCTZ

## 2017-07-10 NOTE — Assessment & Plan Note (Signed)
Some improvement.  Increase Escitalopram to 10 mg

## 2017-07-10 NOTE — Assessment & Plan Note (Signed)
Recheck CMP and CBC and Vit D

## 2017-07-10 NOTE — Progress Notes (Signed)
BP 96/68   Pulse 81   Temp 98 F (36.7 C) (Oral)   Wt 178 lb 6.4 oz (80.9 kg)   LMP  (LMP Unknown)   SpO2 97%   BMI 31.60 kg/m    Subjective:    Patient ID: Renee Bridges, female    DOB: Jan 26, 1946, 71 y.o.   MRN: [id number]  HPI: Renee Bridges is a 71 y.o. female  Chief Complaint  Patient presents with  . Depression    4 week f/up   . Transportation    pt states she wants to talk with someone about getting different transportation- possible C3?   Depression Lexapro seems to be helping.  Helps her sleep Depression screen [facility] 2/9 07/10/2017 06/01/2017 05/04/2017 03/21/2017 09/25/2016  Decreased Interest 3 2 3 3 3   Down, Depressed, Hopeless 0 2 2 1 2   PHQ - 2 Score 3 4 5 4 5   Altered sleeping 3 3 3 3 3   Tired, decreased energy 3 3 3 3 3   Change in appetite 2 2 3 3 3   Feeling bad or failure about yourself  0 2 2 3 2   Trouble concentrating 3 3 2 3 2   Moving slowly or fidgety/restless 0 3 3 2 2   Suicidal thoughts 0 0 0 2 0  PHQ-9 Score 14 20 21 23 20   Difficult doing work/chores - - Extremely dIfficult - -  Some recent data might be hidden   Trouble with transportation coming here  Vomiting Pt states she eats once a day.  She states she frequently vomits when she eats.  This was a complaint her past visit  Hypertension Using medications without difficulty  Feels swimmy headed when bends overe No chest pain with exertion or shortness of breath No Edema  Relevant past medical, surgical, family and social history reviewed and updated as indicated. Interim medical history since our last visit reviewed. Allergies and medications reviewed and updated.  Review of Systems  Per HPI unless specifically indicated above     Objective:    BP 96/68   Pulse 81   Temp 98 F (36.7 C) (Oral)   Wt 178 lb 6.4 oz (80.9 kg)   LMP  (LMP Unknown)   SpO2 97%   BMI 31.60 kg/m   Wt Readings from Last 3 Encounters:  07/10/17 178 lb 6.4 oz (80.9 kg)  06/01/17 179 lb 12.8 oz (81.6  kg)  05/04/17 182 lb 1.6 oz (82.6 kg)    Physical Exam  Constitutional: She is oriented to person, place, and time. She appears well-developed and well-nourished. No distress.  HENT:  Head: Normocephalic and atraumatic.  Eyes: Conjunctivae and lids are normal. Right eye exhibits no discharge. Left eye exhibits no discharge. No scleral icterus.  Neck: Normal range of motion. Neck supple. No JVD present. Carotid bruit is not present.  Cardiovascular: Normal rate, regular rhythm and normal heart sounds.  Pulmonary/Chest: Effort normal and breath sounds normal.  Abdominal: Normal appearance. There is no splenomegaly or hepatomegaly.  Musculoskeletal: Normal range of motion.  Neurological: She is alert and oriented to person, place, and time.  Skin: Skin is warm, dry and intact. No rash noted. No pallor.  Psychiatric: She has a normal mood and affect. Her behavior is normal. Judgment and thought content normal.    Results for orders placed or performed in visit on 06/01/17  CBC with Differential/Platelet  Result Value Ref Range   WBC 8.2 3.4 - 10.8 x10E3/uL   RBC 4.93 3.77 -  5.28 x10E6/uL   Hemoglobin 15.3 11.1 - 15.9 g/dL   Hematocrit 16.147.3 (H) 09.634.0 - 46.6 %   MCV 96 79 - 97 fL   MCH 31.0 26.6 - 33.0 pg   MCHC 32.3 31.5 - 35.7 g/dL   RDW 04.513.2 40.912.3 - 81.115.4 %   Platelets 239 150 - 379 x10E3/uL   Neutrophils 55 Not Estab. %   Lymphs 34 Not Estab. %   Monocytes 9 Not Estab. %   Eos 2 Not Estab. %   Basos 0 Not Estab. %   Neutrophils Absolute 4.5 1.4 - 7.0 x10E3/uL   Lymphocytes Absolute 2.8 0.7 - 3.1 x10E3/uL   Monocytes Absolute 0.7 0.1 - 0.9 x10E3/uL   EOS (ABSOLUTE) 0.1 0.0 - 0.4 x10E3/uL   Basophils Absolute 0.0 0.0 - 0.2 x10E3/uL   Immature Granulocytes 0 Not Estab. %   Immature Grans (Abs) 0.0 0.0 - 0.1 x10E3/uL  Comprehensive metabolic panel  Result Value Ref Range   Glucose 89 65 - 99 mg/dL   BUN 14 8 - 27 mg/dL   Creatinine, Ser 9.141.25 (H) 0.57 - 1.00 mg/dL   GFR calc non  Af Amer 43 (L) >59 mL/min/1.73   GFR calc Af Amer 49 (L) >59 mL/min/1.73   BUN/Creatinine Ratio 11 (L) 12 - 28   Sodium 139 134 - 144 mmol/L   Potassium 4.5 3.5 - 5.2 mmol/L   Chloride 96 96 - 106 mmol/L   CO2 25 20 - 29 mmol/L   Calcium 9.8 8.7 - 10.3 mg/dL   Total Protein 6.9 6.0 - 8.5 g/dL   Albumin 4.4 3.5 - 4.8 g/dL   Globulin, Total 2.5 1.5 - 4.5 g/dL   Albumin/Globulin Ratio 1.8 1.2 - 2.2   Bilirubin Total 0.5 0.0 - 1.2 mg/dL   Alkaline Phosphatase 82 39 - 117 IU/L   AST 24 0 - 40 IU/L   ALT 15 0 - 32 IU/L      Assessment & Plan:   Problem List Items Addressed This Visit      Unprioritized   Chronic kidney disease, stage 3 (HCC)    Recheck CMP and CBC and Vit D      Relevant Orders   CBC with Differential/Platelet   VITAMIN D 25 Hydroxy (Vit-D Deficiency, Fractures)   Depression    Some improvement.  Increase Escitalopram to 10 mg      Relevant Medications   escitalopram (LEXAPRO) 10 MG tablet   Essential hypertension    BP is low normal with dizzyness.  DC Lisonopril/HCTZ      Relevant Orders   Comprehensive metabolic panel   Vomiting    Episodic.  Stop BP medication.  Increase Lexapro.  Recheck in 1 month      Relevant Orders   TSH    Other Visit Diagnoses    Assistance needed with transportation    -  Primary   Relevant Orders   Ambulatory referral to [facility]       Follow up plan: Return in about 4 weeks (around 08/07/2017).

## 2017-07-11 ENCOUNTER — Encounter: Payer: Self-pay | Admitting: Unknown Physician Specialty

## 2017-07-11 LAB — CBC WITH DIFFERENTIAL/PLATELET
BASOS ABS: 0 10*3/uL (ref 0.0–0.2)
Basos: 1 %
EOS (ABSOLUTE): 0.1 10*3/uL (ref 0.0–0.4)
Eos: 1 %
Hematocrit: 45.7 % (ref 34.0–46.6)
Hemoglobin: 15.1 g/dL (ref 11.1–15.9)
Immature Grans (Abs): 0 10*3/uL (ref 0.0–0.1)
Immature Granulocytes: 0 %
Lymphocytes Absolute: 3.2 10*3/uL — ABNORMAL HIGH (ref 0.7–3.1)
Lymphs: 38 %
MCH: 32.4 pg (ref 26.6–33.0)
MCHC: 33 g/dL (ref 31.5–35.7)
MCV: 98 fL — ABNORMAL HIGH (ref 79–97)
MONOS ABS: 0.8 10*3/uL (ref 0.1–0.9)
Monocytes: 10 %
Neutrophils Absolute: 4.4 10*3/uL (ref 1.4–7.0)
Neutrophils: 50 %
PLATELETS: 248 10*3/uL (ref 150–379)
RBC: 4.66 x10E6/uL (ref 3.77–5.28)
RDW: 13.6 % (ref 12.3–15.4)
WBC: 8.6 10*3/uL (ref 3.4–10.8)

## 2017-07-11 LAB — COMPREHENSIVE METABOLIC PANEL
A/G RATIO: 1.9 (ref 1.2–2.2)
ALK PHOS: 82 IU/L (ref 39–117)
ALT: 22 IU/L (ref 0–32)
AST: 27 IU/L (ref 0–40)
Albumin: 4.4 g/dL (ref 3.5–4.8)
BUN/Creatinine Ratio: 12 (ref 12–28)
BUN: 13 mg/dL (ref 8–27)
Bilirubin Total: 0.6 mg/dL (ref 0.0–1.2)
CHLORIDE: 99 mmol/L (ref 96–106)
CO2: 24 mmol/L (ref 20–29)
Calcium: 9.6 mg/dL (ref 8.7–10.3)
Creatinine, Ser: 1.1 mg/dL — ABNORMAL HIGH (ref 0.57–1.00)
GFR calc Af Amer: 58 mL/min/{1.73_m2} — ABNORMAL LOW (ref >59)
GFR calc non Af Amer: 50 mL/min/{1.73_m2} — ABNORMAL LOW (ref >59)
GLOBULIN, TOTAL: 2.3 g/dL (ref 1.5–4.5)
Glucose: 79 mg/dL (ref 65–99)
POTASSIUM: 4.4 mmol/L (ref 3.5–5.2)
SODIUM: 139 mmol/L (ref 134–144)
Total Protein: 6.7 g/dL (ref 6.0–8.5)

## 2017-07-11 LAB — TSH: TSH: 2.54 u[IU]/mL (ref 0.450–4.500)

## 2017-07-11 LAB — VITAMIN D 25 HYDROXY (VIT D DEFICIENCY, FRACTURES): VIT D 25 HYDROXY: 28.2 ng/mL — AB (ref 30.0–100.0)

## 2017-08-07 ENCOUNTER — Ambulatory Visit: Payer: Medicare Other | Admitting: Unknown Physician Specialty

## 2017-08-20 ENCOUNTER — Telehealth: Payer: Self-pay | Admitting: Unknown Physician Specialty

## 2017-08-20 NOTE — Telephone Encounter (Signed)
Routing to provider  

## 2017-08-20 NOTE — Telephone Encounter (Signed)
Is she talking about her Lexapro?  Lost to f/u. Needs to be seen

## 2017-08-20 NOTE — Telephone Encounter (Signed)
Requesting nerve medicine.

## 2017-08-20 NOTE — Telephone Encounter (Signed)
Copied from CRM [id number]. Topic: Quick Communication - Rx Refill/Question >> Aug 20, 2017 10:25 AM Landry MellowFoltz, Melissa J wrote: Has the patient contacted their pharmacy? No.   (Agent: If no, request that the patient contact the pharmacy for the refill.)   Preferred Pharmacy (with phone number or street name): [facility], pt would like to have med called in for her nerves.  Her daughter passed away on Saturday and pt is having hard time with the funeral and arrangements .  Cb number is [phone] cell number.  Pt is at her grandaughters house.    Agent: Please be advised that RX refills may take up to 3 business days. We ask that you follow-up with your pharmacy.

## 2017-08-21 MED ORDER — CLONAZEPAM 0.5 MG PO TABS: 0.5000 mg | ORAL_TABLET | Freq: Two times a day (BID) | ORAL | 1 refills | Status: DC | PRN

## 2017-08-21 NOTE — Telephone Encounter (Signed)
RX failed to fax so I called it in verbally. Called and spoke to patient. I apologized for her loss and let her know about RX being called in for her.

## 2017-08-21 NOTE — Telephone Encounter (Signed)
Patient requesting a new medication for nerves due to a death in the family. Please see message below.

## 2017-08-21 NOTE — Addendum Note (Signed)
Addended by: Gabriel CirriWICKER, Concettina Leth on: 08/21/2017 10:33 AM   Modules accepted: Orders

## 2017-08-21 NOTE — Telephone Encounter (Signed)
[facility] Tried calling patient on both numbers listed to apologize for her loss and to let her know about prescription, but neither number had a VM box available. Will try to call patient again later.

## 2017-08-25 ENCOUNTER — Encounter: Payer: Self-pay | Admitting: Emergency Medicine

## 2017-08-25 ENCOUNTER — Other Ambulatory Visit: Payer: Self-pay

## 2017-08-25 ENCOUNTER — Emergency Department
Admission: EM | Admit: 2017-08-25 | Discharge: 2017-08-26 | Disposition: A | Discharge status: Home / Self Care | Payer: Medicare Other | Attending: Emergency Medicine | Admitting: Emergency Medicine

## 2017-08-25 DIAGNOSIS — Z79899 Other long term (current) drug therapy: Secondary | ICD-10-CM | POA: Insufficient documentation

## 2017-08-25 DIAGNOSIS — R45851 Suicidal ideations: Secondary | ICD-10-CM | POA: Diagnosis not present

## 2017-08-25 DIAGNOSIS — F4320 Adjustment disorder, unspecified: Secondary | ICD-10-CM | POA: Insufficient documentation

## 2017-08-25 DIAGNOSIS — I129 Hypertensive chronic kidney disease with stage 1 through stage 4 chronic kidney disease, or unspecified chronic kidney disease: Secondary | ICD-10-CM | POA: Insufficient documentation

## 2017-08-25 DIAGNOSIS — J45909 Unspecified asthma, uncomplicated: Secondary | ICD-10-CM | POA: Insufficient documentation

## 2017-08-25 DIAGNOSIS — Z96652 Presence of left artificial knee joint: Secondary | ICD-10-CM | POA: Insufficient documentation

## 2017-08-25 DIAGNOSIS — N183 Chronic kidney disease, stage 3 (moderate): Secondary | ICD-10-CM | POA: Diagnosis not present

## 2017-08-25 DIAGNOSIS — Z046 Encounter for general psychiatric examination, requested by authority: Secondary | ICD-10-CM | POA: Diagnosis not present

## 2017-08-25 DIAGNOSIS — G479 Sleep disorder, unspecified: Secondary | ICD-10-CM | POA: Diagnosis present

## 2017-08-25 LAB — CBC
HEMATOCRIT: 48.6 % — AB (ref 35.0–47.0)
HEMOGLOBIN: 16.1 g/dL — AB (ref 12.0–16.0)
MCH: 32.6 pg (ref 26.0–34.0)
MCHC: 33.2 g/dL (ref 32.0–36.0)
MCV: 98.1 fL (ref 80.0–100.0)
Platelets: 214 10*3/uL (ref 150–440)
RBC: 4.95 MIL/uL (ref 3.80–5.20)
RDW: 13.8 % (ref 11.5–14.5)
WBC: 6.7 10*3/uL (ref 3.6–11.0)

## 2017-08-25 LAB — COMPREHENSIVE METABOLIC PANEL
ALBUMIN: 4.7 g/dL (ref 3.5–5.0)
ALK PHOS: 82 U/L (ref 38–126)
ALT: 28 U/L (ref 14–54)
AST: 33 U/L (ref 15–41)
Anion gap: 12 (ref 5–15)
BUN: 14 mg/dL (ref 6–20)
CO2: 23 mmol/L (ref 22–32)
Calcium: 9.5 mg/dL (ref 8.9–10.3)
Chloride: 107 mmol/L (ref 101–111)
Creatinine, Ser: 1.02 mg/dL — ABNORMAL HIGH (ref 0.44–1.00)
GFR calc Af Amer: >60 mL/min (ref >60)
GFR calc non Af Amer: 53 mL/min — ABNORMAL LOW (ref >60)
GLUCOSE: 104 mg/dL — AB (ref 65–99)
POTASSIUM: 3.7 mmol/L (ref 3.5–5.1)
SODIUM: 142 mmol/L (ref 135–145)
Total Bilirubin: 1.2 mg/dL (ref 0.3–1.2)
Total Protein: 8 g/dL (ref 6.5–8.1)

## 2017-08-25 LAB — URINE DRUG SCREEN, QUALITATIVE (ARMC ONLY)
AMPHETAMINES, UR SCREEN: NOT DETECTED
Barbiturates, Ur Screen: NOT DETECTED
Benzodiazepine, Ur Scrn: NOT DETECTED
COCAINE METABOLITE, UR ~~LOC~~: NOT DETECTED
Cannabinoid 50 Ng, Ur ~~LOC~~: NOT DETECTED
MDMA (Ecstasy)Ur Screen: NOT DETECTED
METHADONE SCREEN, URINE: NOT DETECTED
Opiate, Ur Screen: NOT DETECTED
Phencyclidine (PCP) Ur S: NOT DETECTED
Tricyclic, Ur Screen: NOT DETECTED

## 2017-08-25 LAB — ETHANOL: Alcohol, Ethyl (B): <10 mg/dL (ref <10)

## 2017-08-25 LAB — SALICYLATE LEVEL: Salicylate Lvl: <7 mg/dL (ref 2.8–30.0)

## 2017-08-25 LAB — ACETAMINOPHEN LEVEL

## 2017-08-25 NOTE — ED Notes (Signed)
Pt daughter (sherry)- [phone]

## 2017-08-25 NOTE — ED Triage Notes (Addendum)
Pt states her youngest daughter passed away and memorial service was today and Dr. Jamesetta OrleansWicker put patient on anxiety medications to help with her.  Pt states when she got home from the service, she took one pill and laid down and went to sleep, pt states she left a message for her sister making statements about going to be with "sandy and my momma".    Pt states "i'm not going to do nothin' to take my life"  Pt states she is not going to do anything to her life.  Pt states the family went to eat without her which made her upset. Pt doubts that her family loves her.  Sister is in the lobby which she states she cares about her.  [facility] [place] [facility] [place] [facility].

## 2017-08-25 NOTE — ED Notes (Signed)
Report given to Matt, RN in the ED BHU.  

## 2017-08-25 NOTE — ED Notes (Signed)

## 2017-08-25 NOTE — ED Notes (Signed)
SOC called and was given report on patient.  St[facility] in [facility] with [facility].

## 2017-08-25 NOTE — ED Notes (Signed)
Pt given ginger ale.

## 2017-08-25 NOTE — ED Notes (Signed)
Pt. Moved from quad #20A to BHU #8.  Pt. Ambulated with no difficulties and steady gait.  Pt. Advised of cameras in dept. And 15 min. Checks.  Pt. Aware of upcoming SOC.  Pt. Is pleasant with periods of sadness due to loss of family member.  Pt. Currently denies SI/HI.

## 2017-08-25 NOTE — ED Notes (Signed)
Pt. Requested to look in waiting room for family members.  Sister was in waiting room but stepped out, pt. Informed.

## 2017-08-25 NOTE — ED Notes (Signed)
Pt. Hit call bell, pt. [facility] time [facility].  Pt. Told she would be next to [facility].  Pt. Tearful due to wanting to go home.  Pt. Assured this nurse she was not suicidal, pt. States I just want to be home with family and pet. Pt. Given something to drink.  SOC machine in [facility], waiting for video call.

## 2017-08-25 NOTE — ED Notes (Signed)
Pt. Given ginger ale upon request and additional blankets.

## 2017-08-25 NOTE — Discharge Instructions (Signed)

## 2017-08-25 NOTE — ED Provider Notes (Signed)
[facility] Emergency Department Provider Note  ____________________________________________   First MD Initiated Contact with Patient 08/25/17 1750     (approximate)  I have reviewed the triage vital signs and the nursing notes.   HISTORY  Chief Complaint Psychiatric Evaluation and Suicidal    HPI Renee Bridges is a 71 y.o. female presents for evaluation of anxiety, sleeplessness, and she reports death of her daughter  Patient's daughter died about 1 week ago, she reports since then she has not been sleeping well not eating well, saw her doctor who placed her on Ativan but she reports she is continues to feel very anxious about the death of her daughter.  She called and received assistance today, please came out and she reported to them that she feels as though she wishes she was dead, as they plan to place her under [facility] IVC but brought her here in due to staffing they asked that I take IVC papers out on her which I have done.  The patient does report that she told the police that she had thoughts of killing herself, but reports she would never take action that she has faith in God.  She denies that she is actively wanting to harm herself to me, but reports that she is feels very sad and depressed about the death of her daughter.  She has sought medical care and saw her primary doctor regarding this.  Denies active hallucinations, denies thoughts of hurting herself or wanting to kill herself at this time.  Past Medical History:  Diagnosis Date  . Anxiety   . Arthritis   . Asthma   . Depression   . Headache   . History of hiatal hernia   . Hypertension   . Kidney cyst, acquired   . Seizures (HCC)    hx. as child  . Shortness of breath dyspnea    with excertion    Patient Active Problem List   Diagnosis Date Noted  . Vomiting 07/10/2017  . Advanced care planning/counseling discussion 06/01/2017  . Leg weakness 05/04/2017  . Chronic  kidney disease, stage 3 (HCC) 03/21/2017  . Back pain 01/17/2017  . Chronic back pain 09/25/2016  . Immobility 09/25/2016  . Cyst of right kidney 07/17/2016  . Constipation 07/17/2016  . Chronic fatigue 12/27/2015  . Essential hypertension 12/27/2015  . Failed total left knee replacement (HCC) 05/13/2015  . Depression 03/25/2015  . Anxiety 03/25/2015  . Urge incontinence 06/30/2013    Past Surgical History:  Procedure Laterality Date  . ABDOMINAL HYSTERECTOMY    . BACK SURGERY    . colonoscopy a year ago    . LEG SURGERY Left 03/2015  . mass removed from back - 20 years ago    . SPINAL CORD STIMULATOR INSERTION N/A 01/17/2017   Procedure: LUMBAR SPINAL CORD STIMULATOR INSERTION;  Surgeon: Venita Lick, MD;  Location: [facility] OR;  Service: Orthopedics;  Laterality: N/A;  Requests 3 hrs  . TONSILLECTOMY    . TOTAL KNEE REVISION Left 05/13/2015   Procedure: REVISION LEFT  KNEE ARTHROPLASTY, REVISION PATELLA POLY EXCHANGE;  Surgeon: Samson Frederic, MD;  Location: [facility] ORS;  Service: Orthopedics;  Laterality: Left;    Prior to Admission medications   Medication Sig Start Date End Date Taking? Authorizing Provider  atorvastatin (LIPITOR) 20 MG tablet Take 1 tablet (20 mg total) by mouth daily. Patient not taking: Reported on 07/10/2017 06/08/17   Gabriel Cirri, NP  clonazePAM (KLONOPIN) 0.5 MG tablet Take 1  tablet (0.5 mg total) by mouth 2 (two) times daily as needed for anxiety. 08/21/17   Gabriel CirriWicker, Cheryl, NP  escitalopram (LEXAPRO) 10 MG tablet Take 1 tablet (10 mg total) daily by mouth. 07/10/17   Gabriel CirriWicker, Cheryl, NP  fexofenadine (ALLEGRA) 180 MG tablet Take 180 mg by mouth daily as needed (for sneezing, watery eyes, itchy nose/throat.).    [provider]  Nebulizers MISC as needed.    [provider]  tiZANidine (ZANAFLEX) 4 MG tablet Take 1 tablet by mouth daily as needed. 02/19/17   [provider]    Allergies Codeine  Family History  Problem Relation Age  of Onset  . Cancer Mother   . Cancer Father   . Diabetes Brother   . Cancer Brother   . Cancer Brother     Social History Social History   Tobacco Use  . Smoking status: Never Smoker  . Smokeless tobacco: Never Used  Substance Use Topics  . Alcohol use: No    Alcohol/week: 0.0 oz  . Drug use: No    Review of Systems Constitutional: No fever/chills denies recent illness but reports fatigue and feeling sad over the death of her daughter Eyes: No visual changes. ENT: No sore throat. Cardiovascular: Denies chest pain. Respiratory: Denies shortness of breath. Gastrointestinal: No abdominal pain.  No nausea, no vomiting.  No diarrhea.  No constipation. Genitourinary: Negative for dysuria. Musculoskeletal: Negative for back pain. Skin: Negative for rash. Neurological: Negative for headaches, focal weakness or numbness.  Denies suicidal ideation, reports she regrets saying something like that to the police officer and she was not meaning of it.  ____________________________________________   PHYSICAL EXAM:  VITAL SIGNS: ED Triage Vitals  Enc Vitals Group     BP 08/25/17 1613 (!) 182/90     Pulse Rate 08/25/17 1613 94     Resp 08/25/17 1613 20     Temp 08/25/17 1613 97.6 F (36.4 C)     Temp Source 08/25/17 1613 Oral     SpO2 08/25/17 1613 97 %     Weight 08/25/17 1614 180 lb (81.6 kg)     Height 08/25/17 1614 5\' 3"  (1.6 m)     Head Circumference --      Peak Flow --      Pain Score 08/25/17 1621 0     Pain Loc --      Pain Edu? --      Excl. in GC? --     Constitutional: Alert and oriented. Well appearing and in no acute distress.  She is very pleasant. Eyes: Conjunctivae are normal. Head: Atraumatic. Nose: No congestion/rhinnorhea. Mouth/Throat: Mucous membranes are moist. Neck: No stridor.   Cardiovascular: Normal rate, regular rhythm. Grossly normal heart sounds.  Good peripheral circulation. Respiratory: Normal respiratory effort.  No retractions. Lungs  CTAB. Gastrointestinal: Soft and nontender. No distention. Musculoskeletal: No lower extremity tenderness nor edema. Neurologic:  Normal speech and language. No gross focal neurologic deficits are appreciated.  Skin:  Skin is warm, dry and intact. No rash noted. Psychiatric: Mood and affect are somewhat depressed, she seems saddened but denies any active thoughts to want to hurt her self or anyone else.  ____________________________________________   LABS (all labs ordered are listed, but only abnormal results are displayed)  Labs Reviewed  COMPREHENSIVE METABOLIC PANEL - Abnormal; Notable for the following components:      Result Value   Glucose, Bld 104 (*)    Creatinine, Ser 1.02 (*)    GFR  calc non Af Amer 53 (*)    All other components within normal limits  ACETAMINOPHEN LEVEL - Abnormal; Notable for the following components:   Acetaminophen (Tylenol), Serum <10 (*)    All other components within normal limits  CBC - Abnormal; Notable for the following components:   Hemoglobin 16.1 (*)    HCT 48.6 (*)    All other components within normal limits  ETHANOL  SALICYLATE LEVEL  URINE DRUG SCREEN, QUALITATIVE ([facility] ONLY)   ____________________________________________  EKG   ____________________________________________  RADIOLOGY   ____________________________________________   PROCEDURES  Procedure(s) performed: None  Procedures  Critical Care performed: No  ____________________________________________   INITIAL IMPRESSION / ASSESSMENT AND PLAN / ED COURSE  Pertinent labs & imaging results that were available during my care of the patient were reviewed by me and considered in my medical decision making (see chart for details).  Patient presents for evaluation of anxiety, depressive symptoms, and made what sounds like insincere statement about wanting to kill herself to police officer today.  Currently under IVC.  She reports that she has no thoughts of  wanting to harm herself, has faith in God above the if anyone is going to take her from there if there would be him and she would never hurt herself or take any action to harm her self but feels very sad about the death of her daughter.  My examination seem to suggest the patient is having an adjustment disorder related to the death of her daughter, I will have her seen by psychiatry.  No acute medical complaints.  Labs reviewed, no acute     ----------------------------------------- 11:53 PM on 08/25/2017 -----------------------------------------  Patient has been seen and evaluated by psychiatry.  They advise patient appears to be suffering from adjustment disorder, they recommend that the patient may be discharged and IVC discontinued.  Follow-up with primary physician. ____________________________________________   FINAL CLINICAL IMPRESSION(S) / ED DIAGNOSES  Final diagnoses:  Adjustment disorder, unspecified type      NEW MEDICATIONS STARTED DURING THIS VISIT:  This SmartLink is deprecated. Use AVSMEDLIST instead to display the medication list for a patient.   Note:  This document was prepared using Dragon voice recognition software and may include unintentional dictation errors.     Sharyn CreamerQuale, Mark, MD 08/25/17 [phone]

## 2017-08-25 NOTE — ED Notes (Signed)
Pt. Finished SOC. 

## 2017-08-26 NOTE — ED Notes (Signed)
Pt. Going home via taxi cab voucher.

## 2017-08-26 NOTE — ED Notes (Signed)
Pt. Was not able to give insurance card to registration when patient first came in due to changing out into scrubs in triage.  Pt. Taken to registration before discharge so information could be updated.  Pt. Escorted to waiting room with cab voucher.  Cab agency([employer]) called and will be picking up patient in ED waiting room.  Pt. Very grateful.

## 2017-09-03 ENCOUNTER — Ambulatory Visit: Payer: Medicare Other | Admitting: Unknown Physician Specialty

## 2017-09-14 ENCOUNTER — Ambulatory Visit (INDEPENDENT_AMBULATORY_CARE_PROVIDER_SITE_OTHER): Payer: Medicare Other | Admitting: Unknown Physician Specialty

## 2017-09-14 ENCOUNTER — Encounter: Payer: Self-pay | Admitting: Unknown Physician Specialty

## 2017-09-14 ENCOUNTER — Ambulatory Visit: Payer: Medicare Other | Admitting: Unknown Physician Specialty

## 2017-09-14 VITALS — BP 145/83 | HR 75 | Temp 98.3°F | Wt 175.4 lb

## 2017-09-14 DIAGNOSIS — Z7189 Other specified counseling: Secondary | ICD-10-CM | POA: Diagnosis not present

## 2017-09-14 DIAGNOSIS — F322 Major depressive disorder, single episode, severe without psychotic features: Secondary | ICD-10-CM | POA: Diagnosis not present

## 2017-09-14 DIAGNOSIS — Z23 Encounter for immunization: Secondary | ICD-10-CM

## 2017-09-14 DIAGNOSIS — N281 Cyst of kidney, acquired: Secondary | ICD-10-CM | POA: Diagnosis not present

## 2017-09-14 MED ORDER — MIRTAZAPINE 15 MG PO TABS: 15.0000 mg | ORAL_TABLET | Freq: Every day | ORAL | 1 refills | Status: DC

## 2017-09-14 NOTE — Progress Notes (Signed)
BP (!) 145/83 (BP Location: Left Arm, Cuff Size: Large)   Pulse 75   Temp 98.3 F (36.8 C) (Oral)   Wt 175 lb 6.4 oz (79.6 kg)   LMP  (LMP Unknown)   SpO2 97%   BMI 31.07 kg/m    Subjective:    Patient ID: Renee Bridges, female    DOB: 1946/06/06, 72 y.o.   MRN: [id number]  HPI: Renee Bridges is a 73 y.o. female  Chief Complaint  Patient presents with  . Depression    pt states she has not been taking any medications, states she took one klonopin when she got upset one day   History given with assistance of granddaughter.  Pt unexpectedly died of hypothermia due to ETOH.    Depression Pt has significant depression following losing her daughter.  The police were called because her granddaughter was concerned about her safety and police escorted to the Er.  After video evaluation they sent her home by taxi at 3A.  Emotions are labile.  She calls her grand daughter often tearful.  Given Clonazepam which did the opposite reaction.  Not sleeping.  Not eating well.     Depression screen [facility] 2/9 09/14/2017 07/10/2017 06/01/2017 05/04/2017 03/21/2017  Decreased Interest 2 3 2 3 3   Down, Depressed, Hopeless 3 0 2 2 1   PHQ - 2 Score 5 3 4 5 4   Altered sleeping 3 3 3 3 3   Tired, decreased energy 1 3 3 3 3   Change in appetite 1 2 2 3 3   Feeling bad or failure about yourself  3 0 2 2 3   Trouble concentrating 0 3 3 2 3   Moving slowly or fidgety/restless 0 0 3 3 2   Suicidal thoughts 2 0 0 0 2  PHQ-9 Score 15 14 20 21 23   Difficult doing work/chores - - - Extremely dIfficult -  Some recent data might be hidden    Relevant past medical, surgical, family and social history reviewed and updated as indicated. Interim medical history since our last visit reviewed. Allergies and medications reviewed and updated.  Review of Systems  Constitutional: Positive for appetite change and fatigue. Negative for unexpected weight change.  HENT: Negative.   Respiratory: Negative.   Cardiovascular:  Positive for palpitations.  Gastrointestinal: Negative.   Endocrine: Negative for cold intolerance and heat intolerance.    Per HPI unless specifically indicated above     Objective:    BP (!) 145/83 (BP Location: Left Arm, Cuff Size: Large)   Pulse 75   Temp 98.3 F (36.8 C) (Oral)   Wt 175 lb 6.4 oz (79.6 kg)   LMP  (LMP Unknown)   SpO2 97%   BMI 31.07 kg/m   Wt Readings from Last 3 Encounters:  09/14/17 175 lb 6.4 oz (79.6 kg)  08/25/17 180 lb (81.6 kg)  07/10/17 178 lb 6.4 oz (80.9 kg)    Physical Exam  Constitutional: She is oriented to person, place, and time. She appears well-developed and well-nourished. No distress.  HENT:  Head: Normocephalic and atraumatic.  Eyes: Conjunctivae and lids are normal. Right eye exhibits no discharge. Left eye exhibits no discharge. No scleral icterus.  Neck: Normal range of motion. Neck supple. No JVD present. Carotid bruit is not present.  Cardiovascular: Normal rate, regular rhythm and normal heart sounds.  Pulmonary/Chest: Effort normal and breath sounds normal.  Abdominal: Normal appearance. There is no splenomegaly or hepatomegaly.  Musculoskeletal: Normal range of motion.  Neurological: She  is alert and oriented to person, place, and time.  Skin: Skin is warm, dry and intact. No rash noted. No pallor.  Psychiatric: She has a normal mood and affect. Her behavior is normal. Judgment and thought content normal.    Results for orders placed or performed during the hospital encounter of 08/25/17  Comprehensive metabolic panel  Result Value Ref Range   Sodium 142 135 - 145 mmol/L   Potassium 3.7 3.5 - 5.1 mmol/L   Chloride 107 101 - 111 mmol/L   CO2 23 22 - 32 mmol/L   Glucose, Bld 104 (H) 65 - 99 mg/dL   BUN 14 6 - 20 mg/dL   Creatinine, Ser 1.61 (H) 0.44 - 1.00 mg/dL   Calcium 9.5 8.9 - 09.6 mg/dL   Total Protein 8.0 6.5 - 8.1 g/dL   Albumin 4.7 3.5 - 5.0 g/dL   AST 33 15 - 41 U/L   ALT 28 14 - 54 U/L   Alkaline  Phosphatase 82 38 - 126 U/L   Total Bilirubin 1.2 0.3 - 1.2 mg/dL   GFR calc non Af Amer 53 (L) >60 mL/min   GFR calc Af Amer >60 >60 mL/min   Anion gap 12 5 - 15  Ethanol  Result Value Ref Range   Alcohol, Ethyl (B) <10 <10 mg/dL  Salicylate level  Result Value Ref Range   Salicylate Lvl <7.0 2.8 - 30.0 mg/dL  Acetaminophen level  Result Value Ref Range   Acetaminophen (Tylenol), Serum <10 (L) 10 - 30 ug/mL  cbc  Result Value Ref Range   WBC 6.7 3.6 - 11.0 K/uL   RBC 4.95 3.80 - 5.20 MIL/uL   Hemoglobin 16.1 (H) 12.0 - 16.0 g/dL   HCT 04.5 (H) 40.9 - 81.1 %   MCV 98.1 80.0 - 100.0 fL   MCH 32.6 26.0 - 34.0 pg   MCHC 33.2 32.0 - 36.0 g/dL   RDW 91.4 78.2 - 95.6 %   Platelets 214 150 - 440 K/uL  Urine Drug Screen, Qualitative  Result Value Ref Range   Tricyclic, Ur Screen NONE DETECTED NONE DETECTED   Amphetamines, Ur Screen NONE DETECTED NONE DETECTED   MDMA (Ecstasy)Ur Screen NONE DETECTED NONE DETECTED   Cocaine Metabolite,Ur [place] NONE DETECTED NONE DETECTED   Opiate, Ur Screen NONE DETECTED NONE DETECTED   Phencyclidine (PCP) Ur S NONE DETECTED NONE DETECTED   Cannabinoid 50 Ng, Ur [place] NONE DETECTED NONE DETECTED   Barbiturates, Ur Screen NONE DETECTED NONE DETECTED   Benzodiazepine, Ur Scrn NONE DETECTED NONE DETECTED   Methadone Scn, Ur NONE DETECTED NONE DETECTED      Assessment & Plan:   Problem List Items Addressed This Visit      Unprioritized   Advanced care planning/counseling discussion    A voluntary discussion about advance care planning including the explanation and discussion of advance directives was extensively discussed  with the patient.  Explanation about the health care proxy and Living will was reviewed and packet with forms with explanation of how to fill them out was given.  During this discussion, the patient was able to identify a health care proxy as her grand daughter and plans to fill out the paperwork required.  Patient was offered a separate  Advance Care Planning visit for further assistance with forms.         Cyst of right kidney    Needs f/u next visit      Depression    Pt with severe depression  following death of daughter.  She has few resources.  Will refer to connected care for further assistance. Needs a companion and counseling.  Start Mirtazipine 15 mg QHS.        Relevant Medications   mirtazapine (REMERON) 15 MG tablet    Other Visit Diagnoses    Need for influenza vaccination    -  Primary   Relevant Orders   Flu vaccine HIGH DOSE PF (Completed)   Depression, major, single episode, severe (HCC)       Relevant Medications   mirtazapine (REMERON) 15 MG tablet   Other Relevant Orders   Ambulatory referral to Connected Care       Follow up plan: Return in about 2 weeks (around 09/28/2017).

## 2017-09-14 NOTE — Assessment & Plan Note (Signed)
Pt with severe depression following death of daughter.  She has few resources.  Will refer to connected care for further assistance. Needs a companion and counseling.  Start Mirtazipine 15 mg QHS.

## 2017-09-14 NOTE — Patient Instructions (Addendum)
Influenza (Flu) Vaccine (Inactivated or Recombinant): What You Need to Know 1. Why get vaccinated? Influenza ("flu") is a contagious disease that spreads around the [place] every year, usually between October and May. Flu is caused by influenza viruses, and is spread mainly by coughing, sneezing, and close contact. Anyone can get flu. Flu strikes suddenly and can last several days. Symptoms vary by age, but can include:  fever/chills  sore throat  muscle aches  fatigue  cough  headache  runny or stuffy nose  Flu can also lead to pneumonia and blood infections, and cause diarrhea and seizures in children. If you have a medical condition, such as heart or lung disease, flu can make it worse. Flu is more dangerous for some people. Infants and young children, people 65 years of age and older, pregnant women, and people with certain health conditions or a weakened immune system are at greatest risk. Each year thousands of people in the [place] die from flu, and many more are hospitalized. Flu vaccine can:  keep you from getting flu,  make flu less severe if you do get it, and  keep you from spreading flu to your family and other people. 2. Inactivated and recombinant flu vaccines A dose of flu vaccine is recommended every flu season. Children 6 months through 8 years of age may need two doses during the same flu season. Everyone else needs only one dose each flu season. Some inactivated flu vaccines contain a very small amount of a mercury-based preservative called thimerosal. Studies have not shown thimerosal in vaccines to be harmful, but flu vaccines that do not contain thimerosal are available. There is no live flu virus in flu shots. They cannot cause the flu. There are many flu viruses, and they are always changing. Each year a new flu vaccine is made to protect against three or four viruses that are likely to cause disease in the upcoming flu season. But even when the  vaccine doesn't exactly match these viruses, it may still provide some protection. Flu vaccine cannot prevent:  flu that is caused by a virus not covered by the vaccine, or  illnesses that look like flu but are not.  It takes about 2 weeks for protection to develop after vaccination, and protection lasts through the flu season. 3. Some people should not get this vaccine Tell the person who is giving you the vaccine:  If you have any severe, life-threatening allergies. If you ever had a life-threatening allergic reaction after a dose of flu vaccine, or have a severe allergy to any part of this vaccine, you may be advised not to get vaccinated. Most, but not all, types of flu vaccine contain a small amount of egg protein.  If you ever had Guillain-Barr Syndrome (also called GBS). Some people with a history of GBS should not get this vaccine. This should be discussed with your doctor.  If you are not feeling well. It is usually okay to get flu vaccine when you have a mild illness, but you might be asked to come back when you feel better.  4. Risks of a vaccine reaction With any medicine, including vaccines, there is a chance of reactions. These are usually mild and go away on their own, but serious reactions are also possible. Most people who get a flu shot do not have any problems with it. Minor problems following a flu shot include:  soreness, redness, or swelling where the shot was given  hoarseness  sore,   red or itchy eyes  cough  fever  aches  headache  itching  fatigue  If these problems occur, they usually begin soon after the shot and last 1 or 2 days. More serious problems following a flu shot can include the following:  There may be a small increased risk of Guillain-Barre Syndrome (GBS) after inactivated flu vaccine. This risk has been estimated at 1 or 2 additional cases per million people vaccinated. This is much lower than the risk of severe complications from  flu, which can be prevented by flu vaccine.  Young children who get the flu shot along with pneumococcal vaccine (PCV13) and/or DTaP vaccine at the same time might be slightly more likely to have a seizure caused by fever. Ask your doctor for more information. Tell your doctor if a child who is getting flu vaccine has ever had a seizure.  Problems that could happen after any injected vaccine:  People sometimes faint after a medical procedure, including vaccination. Sitting or lying down for about 15 minutes can help prevent fainting, and injuries caused by a fall. Tell your doctor if you feel dizzy, or have vision changes or ringing in the ears.  Some people get severe pain in the shoulder and have difficulty moving the arm where a shot was given. This happens very rarely.  Any medication can cause a severe allergic reaction. Such reactions from a vaccine are very rare, estimated at about 1 in a million doses, and would happen within a few minutes to a few hours after the vaccination. As with any medicine, there is a very remote chance of a vaccine causing a serious injury or death. The safety of vaccines is always being monitored. For more information, visit: http://www.aguilar.org/ 5. What if there is a serious reaction? What should I look for? Look for anything that concerns you, such as signs of a severe allergic reaction, very high fever, or unusual behavior. Signs of a severe allergic reaction can include hives, swelling of the face and throat, difficulty breathing, a fast heartbeat, dizziness, and weakness. These would start a few minutes to a few hours after the vaccination. What should I do?  If you think it is a severe allergic reaction or other emergency that can't wait, call 9-1-1 and get the person to the nearest hospital. Otherwise, call your doctor.  Reactions should be reported to the Vaccine Adverse Event Reporting System (VAERS). Your doctor should file this report, or you  can do it yourself through the VAERS web site at www.vaers.SamedayNews.es, or by calling 6094730752. ? VAERS does not give medical advice. 6. The National Vaccine Injury Compensation Program The [employer] Vaccine Injury Compensation Program (VICP) is a federal program that was created to compensate people who may have been injured by certain vaccines. Persons who believe they may have been injured by a vaccine can learn about the program and about filing a claim by calling 458-267-6070 or visiting the [place] website at GoldCloset.com.ee. There is a time limit to file a claim for compensation. 7. How can I learn more?  Ask your healthcare provider. He or she can give you the vaccine package insert or suggest other sources of information.  Call your local or state health department.  Contact the Centers for Disease Control and Prevention (CDC): ? Call (540)164-9661 (1-800-CDC-INFO) or ? Visit CDC's website at https://gibson.com/ Vaccine Information Statement, Inactivated Influenza Vaccine (04/10/2014) This information is not intended to replace advice given to you by your health care provider. Make sure  you discuss any questions you have with your health care provider. --------------------------------------------------------- [facility].

## 2017-09-14 NOTE — Assessment & Plan Note (Signed)
A voluntary discussion about advance care planning including the explanation and discussion of advance directives was extensively discussed  with the patient.  Explanation about the health care proxy and Living will was reviewed and packet with forms with explanation of how to fill them out was given.  During this discussion, the patient was able to identify a health care proxy as her grand daughter and plans to fill out the paperwork required.  Patient was offered a separate Advance Care Planning visit for further assistance with forms.

## 2017-09-14 NOTE — Assessment & Plan Note (Signed)
Needs f/u next visit

## 2017-10-02 ENCOUNTER — Ambulatory Visit: Payer: Medicare Other | Admitting: Unknown Physician Specialty

## 2017-10-02 ENCOUNTER — Other Ambulatory Visit: Payer: Self-pay

## 2017-10-02 NOTE — Telephone Encounter (Signed)
Error

## 2017-10-16 ENCOUNTER — Ambulatory Visit: Payer: Medicare Other | Admitting: Unknown Physician Specialty

## 2017-10-16 ENCOUNTER — Ambulatory Visit (INDEPENDENT_AMBULATORY_CARE_PROVIDER_SITE_OTHER): Payer: Medicare Other | Admitting: Unknown Physician Specialty

## 2017-10-16 ENCOUNTER — Encounter: Payer: Self-pay | Admitting: Unknown Physician Specialty

## 2017-10-16 ENCOUNTER — Telehealth: Payer: Self-pay | Admitting: Unknown Physician Specialty

## 2017-10-16 DIAGNOSIS — F339 Major depressive disorder, recurrent, unspecified: Secondary | ICD-10-CM | POA: Insufficient documentation

## 2017-10-16 DIAGNOSIS — F324 Major depressive disorder, single episode, in partial remission: Secondary | ICD-10-CM | POA: Diagnosis not present

## 2017-10-16 DIAGNOSIS — F322 Major depressive disorder, single episode, severe without psychotic features: Secondary | ICD-10-CM | POA: Insufficient documentation

## 2017-10-16 MED ORDER — MIRTAZAPINE 30 MG PO TABS: 30.0000 mg | ORAL_TABLET | Freq: Every day | ORAL | 2 refills | Status: DC

## 2017-10-16 NOTE — Progress Notes (Signed)
BP 133/79   Pulse 69   Temp 98.5 F (36.9 C) (Oral)   Wt 177 lb 3.2 oz (80.4 kg)   LMP  (LMP Unknown)   SpO2 95%   BMI 31.39 kg/m    Subjective:    Patient ID: Renee Bridges, female    DOB: 10/16/1945, 72 y.o.   MRN: [id number]  HPI: Renee Bridges is a 72 y.o. female  Chief Complaint  Patient presents with  . Depression    2 week f/up    Depression Seems to be doing better on Mirtazipine 15 mg QHS.  States she is getting support through her church.   [facility] 2/9 10/16/2017 09/14/2017 07/10/2017 06/01/2017 05/04/2017  Decreased Interest 0 2 3 2 3   Down, Depressed, Hopeless 1 3 0 2 2  PHQ - 2 Score 1 5 3 4 5   Altered sleeping 2 3 3 3 3   Tired, decreased energy 0 1 3 3 3   Change in appetite 0 1 2 2 3   Feeling bad or failure about yourself  3 3 0 2 2  Trouble concentrating 0 0 3 3 2   Moving slowly or fidgety/restless 0 0 0 3 3  Suicidal thoughts 0 2 0 0 0  PHQ-9 Score 6 15 14 20 21   Difficult doing work/chores - - - - Extremely dIfficult  Some recent data might be hidden    Relevant past medical, surgical, family and social history reviewed and updated as indicated. Interim medical history since our last visit reviewed. Allergies and medications reviewed and updated.  Review of Systems  Constitutional: Negative.   HENT: Negative.   Respiratory: Negative.   Cardiovascular: Negative.   Psychiatric/Behavioral: Negative.     Per HPI unless specifically indicated above     Objective:    BP 133/79   Pulse 69   Temp 98.5 F (36.9 C) (Oral)   Wt 177 lb 3.2 oz (80.4 kg)   LMP  (LMP Unknown)   SpO2 95%   BMI 31.39 kg/m   Wt Readings from Last 3 Encounters:  10/16/17 177 lb 3.2 oz (80.4 kg)  09/14/17 175 lb 6.4 oz (79.6 kg)  08/25/17 180 lb (81.6 kg)    Physical Exam  Constitutional: She is oriented to person, place, and time. She appears well-developed and well-nourished. No distress.  HENT:  Head: Normocephalic and atraumatic.  Eyes:  Conjunctivae and lids are normal. Right eye exhibits no discharge. Left eye exhibits no discharge. No scleral icterus.  Neck: Normal range of motion. Neck supple. No JVD present. Carotid bruit is not present.  Cardiovascular: Normal rate, regular rhythm and normal heart sounds.  Pulmonary/Chest: Effort normal and breath sounds normal.  Abdominal: Normal appearance. There is no splenomegaly or hepatomegaly.  Musculoskeletal: Normal range of motion.  Neurological: She is alert and oriented to person, place, and time.  Skin: Skin is warm, dry and intact. No rash noted. No pallor.  Psychiatric: She has a normal mood and affect. Her behavior is normal. Judgment and thought content normal.      Assessment & Plan:   Problem List Items Addressed This Visit      Unprioritized   Depression, major, single episode, in partial remission (HCC)    Doing well.  May be too sleepy.  Increase dose of Mirtazapine to 30 mg as that should decrease the sleeping side-effects.  C3 has not been in contact for bereavement resources.  Will try to contact again  Relevant Medications   mirtazapine (REMERON) 30 MG tablet       Follow up plan: Return in about 6 weeks (around 11/27/2017).

## 2017-10-16 NOTE — Assessment & Plan Note (Addendum)
Doing well.  May be too sleepy.  Increase dose of Mirtazapine to 30 mg as that should decrease the sleeping side-effects.  C3 has not been in contact for bereavement resources.  Will try to contact again

## 2017-11-27 ENCOUNTER — Telehealth: Payer: Self-pay | Admitting: Unknown Physician Specialty
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
## 2017-11-27 NOTE — Telephone Encounter (Signed)
Routing to providers. Unsure of how to handle this situation. Please advise.

## 2017-11-27 NOTE — Telephone Encounter (Signed)
[facility] and states she missed the call from Dr Dossie Arbourrissman.  She is needing to speak with Dr Dossie Arbourrissman.  She was reassured and calm and states she will be expecting a call back.  She spoke about 25-30 mins with [facility] and states she will not harm herself.   Please call.  Thank you
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
## 2017-12-11 ENCOUNTER — Ambulatory Visit: Payer: Medicare Other | Admitting: Unknown Physician Specialty

## 2017-12-11 ENCOUNTER — Telehealth: Payer: Self-pay | Admitting: Unknown Physician Specialty

## 2017-12-11 DIAGNOSIS — F339 Major depressive disorder, recurrent, unspecified: Secondary | ICD-10-CM

## 2017-12-11 NOTE — Telephone Encounter (Signed)
Routing to Cheryl

## 2017-12-11 NOTE — Telephone Encounter (Signed)
Phone note 

## 2017-12-11 NOTE — Telephone Encounter (Signed)
Discuss with daughter [name](sherry [phone]) about patient's condition.  She does not think she is safe to drive and is scared scared to drive with her.  She has emotional swings and threatening to hurt herself. Her daughter is most concerned about depression and anxiety.
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
## 2017-12-18 ENCOUNTER — Ambulatory Visit: Payer: Medicare Other | Admitting: Unknown Physician Specialty

## 2017-12-28 ENCOUNTER — Ambulatory Visit (INDEPENDENT_AMBULATORY_CARE_PROVIDER_SITE_OTHER): Payer: Medicare Other | Admitting: Unknown Physician Specialty

## 2017-12-28 ENCOUNTER — Encounter: Payer: Self-pay | Admitting: Unknown Physician Specialty

## 2017-12-28 DIAGNOSIS — R4182 Altered mental status, unspecified: Secondary | ICD-10-CM | POA: Insufficient documentation

## 2017-12-28 DIAGNOSIS — F322 Major depressive disorder, single episode, severe without psychotic features: Secondary | ICD-10-CM

## 2017-12-28 MED ORDER — MIRTAZAPINE 30 MG PO TABS: 30.0000 mg | ORAL_TABLET | Freq: Every day | ORAL | 1 refills | Status: DC
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
## 2017-12-28 NOTE — Assessment & Plan Note (Signed)
Pt with continued severe depression despite medication.  Denies suicidal ideation.  Refer to psychiatry

## 2018-01-03 ENCOUNTER — Telehealth: Payer: Self-pay | Admitting: Unknown Physician Specialty

## 2018-01-03 NOTE — Telephone Encounter (Signed)
Spoke with patient. She stated she cannot afford a driving test.

## 2018-01-03 NOTE — Telephone Encounter (Signed)
Copied from CRM [id number]. Topic: Quick Communication - See Telephone Encounter >> Jan 03, 2018 11:00 AM Cipriano Bunker wrote: CRM for notification. See Telephone encounter for: 01/03/18.  People at [facility] called it is $200.00 to take the drive test.  She canceled appt. And says she can drive. Medicaid or [employer] will not pay for this.  She wanted the therapist to come to her house and they said she had to come to them.  Please call pt. Back

## 2018-01-04 NOTE — Telephone Encounter (Signed)
Will need to wait for Elnita Maxwell to return

## 2018-01-07 NOTE — Telephone Encounter (Signed)
I made an appt for the social worker to ncome see her (C3).  Can she help with this?

## 2018-03-08 DIAGNOSIS — T819XXA Unspecified complication of procedure, initial encounter: Secondary | ICD-10-CM | POA: Insufficient documentation

## 2018-03-08 HISTORY — DX: Unspecified complication of procedure, initial encounter: T81.9XXA

## 2018-03-29 ENCOUNTER — Encounter: Payer: Self-pay | Admitting: Unknown Physician Specialty

## 2018-03-29 ENCOUNTER — Ambulatory Visit (INDEPENDENT_AMBULATORY_CARE_PROVIDER_SITE_OTHER): Payer: Medicare Other | Admitting: Unknown Physician Specialty

## 2018-03-29 DIAGNOSIS — F322 Major depressive disorder, single episode, severe without psychotic features: Secondary | ICD-10-CM | POA: Diagnosis not present

## 2018-03-29 NOTE — Progress Notes (Signed)
BP (!) 143/81 (BP Location: Left Arm, Cuff Size: Normal)   Pulse 70   Temp 97.6 F (36.4 C) (Oral)   Ht 5' 2.5" (1.588 m)   Wt 171 lb 6.4 oz (77.7 kg)   LMP  (LMP Unknown)   SpO2 97%   BMI 30.85 kg/m    Subjective:    Patient ID: Renee Bridges, female    DOB: 01/10/1946, 72 y.o.   MRN: [id number]  HPI: Renee MannDella S Fennema is a 72 y.o. female  Chief Complaint  Patient presents with  . Depression   Depression Pt is upset today and is very upset with her daughter after a conversation they had.  She does not take her Mirtazapine daily.    Depression screen [facility] 2/9 03/29/2018 12/28/2017 10/16/2017 09/14/2017 07/10/2017  Decreased Interest 3 3 0 2 3  Down, Depressed, Hopeless 3 3 1 3  0  PHQ - 2 Score 6 6 1 5 3   Altered sleeping 3 0 2 3 3   Tired, decreased energy 3 0 0 1 3  Change in appetite 3 2 0 1 2  Feeling bad or failure about yourself  3 2 3 3  0  Trouble concentrating 3 2 0 0 3  Moving slowly or fidgety/restless 3 3 0 0 0  Suicidal thoughts 3 0 0 2 0  PHQ-9 Score 27 15 6 15 14   Difficult doing work/chores - - - - -  Some recent data might be hidden   Tick bite States she had a tick bite about 5 days No symptoms of fatgue, pain or fever.  Does have a headache as she did not eat.    Relevant past medical, surgical, family and social history reviewed and updated as indicated. Interim medical history since our last visit reviewed. Allergies and medications reviewed and updated.  Review of Systems  Per HPI unless specifically indicated above     Objective:    BP (!) 143/81 (BP Location: Left Arm, Cuff Size: Normal)   Pulse 70   Temp 97.6 F (36.4 C) (Oral)   Ht 5' 2.5" (1.588 m)   Wt 171 lb 6.4 oz (77.7 kg)   LMP  (LMP Unknown)   SpO2 97%   BMI 30.85 kg/m   Wt Readings from Last 3 Encounters:  03/29/18 171 lb 6.4 oz (77.7 kg)  12/28/17 173 lb 9.6 oz (78.7 kg)  10/16/17 177 lb 3.2 oz (80.4 kg)    Physical Exam  Constitutional: She is oriented to person, place,  and time. She appears well-developed and well-nourished. No distress.  HENT:  Head: Normocephalic and atraumatic.  Eyes: Conjunctivae and lids are normal. Right eye exhibits no discharge. Left eye exhibits no discharge. No scleral icterus.  Cardiovascular: Normal rate.  Pulmonary/Chest: Effort normal.  Abdominal: Normal appearance. There is no splenomegaly or hepatomegaly.  Musculoskeletal: Normal range of motion.  Neurological: She is alert and oriented to person, place, and time.  Skin: Skin is intact. No rash noted. No pallor.  Psychiatric: She has a normal mood and affect. Her behavior is normal. Judgment and thought content normal.    Results for orders placed or performed during the hospital encounter of 08/25/17  Comprehensive metabolic panel  Result Value Ref Range   Sodium 142 135 - 145 mmol/L   Potassium 3.7 3.5 - 5.1 mmol/L   Chloride 107 101 - 111 mmol/L   CO2 23 22 - 32 mmol/L   Glucose, Bld 104 (H) 65 - 99 mg/dL   BUN  14 6 - 20 mg/dL   Creatinine, Ser 1.61 (H) 0.44 - 1.00 mg/dL   Calcium 9.5 8.9 - 09.6 mg/dL   Total Protein 8.0 6.5 - 8.1 g/dL   Albumin 4.7 3.5 - 5.0 g/dL   AST 33 15 - 41 U/L   ALT 28 14 - 54 U/L   Alkaline Phosphatase 82 38 - 126 U/L   Total Bilirubin 1.2 0.3 - 1.2 mg/dL   GFR calc non Af Amer 53 (L) >60 mL/min   GFR calc Af Amer >60 >60 mL/min   Anion gap 12 5 - 15  Ethanol  Result Value Ref Range   Alcohol, Ethyl (B) <10 <10 mg/dL  Salicylate level  Result Value Ref Range   Salicylate Lvl <7.0 2.8 - 30.0 mg/dL  Acetaminophen level  Result Value Ref Range   Acetaminophen (Tylenol), Serum <10 (L) 10 - 30 ug/mL  cbc  Result Value Ref Range   WBC 6.7 3.6 - 11.0 K/uL   RBC 4.95 3.80 - 5.20 MIL/uL   Hemoglobin 16.1 (H) 12.0 - 16.0 g/dL   HCT 04.5 (H) 40.9 - 81.1 %   MCV 98.1 80.0 - 100.0 fL   MCH 32.6 26.0 - 34.0 pg   MCHC 33.2 32.0 - 36.0 g/dL   RDW 91.4 78.2 - 95.6 %   Platelets 214 150 - 440 K/uL  Urine Drug Screen, Qualitative    Result Value Ref Range   Tricyclic, Ur Screen NONE DETECTED NONE DETECTED   Amphetamines, Ur Screen NONE DETECTED NONE DETECTED   MDMA (Ecstasy)Ur Screen NONE DETECTED NONE DETECTED   Cocaine Metabolite,Ur [place] NONE DETECTED NONE DETECTED   Opiate, Ur Screen NONE DETECTED NONE DETECTED   Phencyclidine (PCP) Ur S NONE DETECTED NONE DETECTED   Cannabinoid 50 Ng, Ur [place] NONE DETECTED NONE DETECTED   Barbiturates, Ur Screen NONE DETECTED NONE DETECTED   Benzodiazepine, Ur Scrn NONE DETECTED NONE DETECTED   Methadone Scn, Ur NONE DETECTED NONE DETECTED      Assessment & Plan:   Problem List Items Addressed This Visit      Unprioritized   Depression, major, single episode, severe (HCC)    Pt with severe depression.  She states she won't hurt herself as she needs to get home with her dog.  She has severe depression and needs to be seen ASAP to get services.  She has been there before but lost to f/u           Follow up plan: Return for with mobile crisis unit.

## 2018-03-29 NOTE — Assessment & Plan Note (Signed)
Pt with severe depression.  She states she won't hurt herself as she needs to get home with her dog.  She has severe depression and needs to be seen ASAP to get services.  She has been there before but lost to f/u

## 2018-04-03 ENCOUNTER — Encounter: Payer: Self-pay | Admitting: Family Medicine

## 2018-04-03 ENCOUNTER — Other Ambulatory Visit: Payer: Self-pay

## 2018-04-03 ENCOUNTER — Ambulatory Visit (INDEPENDENT_AMBULATORY_CARE_PROVIDER_SITE_OTHER): Payer: Medicare Other | Admitting: Family Medicine

## 2018-04-03 VITALS — BP 127/84 | HR 66 | Temp 97.6°F | Ht 62.5 in | Wt 169.6 lb

## 2018-04-03 DIAGNOSIS — F322 Major depressive disorder, single episode, severe without psychotic features: Secondary | ICD-10-CM

## 2018-04-03 DIAGNOSIS — R300 Dysuria: Secondary | ICD-10-CM | POA: Diagnosis not present

## 2018-04-03 DIAGNOSIS — N3001 Acute cystitis with hematuria: Secondary | ICD-10-CM

## 2018-04-03 DIAGNOSIS — I10 Essential (primary) hypertension: Secondary | ICD-10-CM

## 2018-04-03 DIAGNOSIS — N183 Chronic kidney disease, stage 3 unspecified: Secondary | ICD-10-CM

## 2018-04-03 DIAGNOSIS — E785 Hyperlipidemia, unspecified: Secondary | ICD-10-CM | POA: Insufficient documentation

## 2018-04-03 DIAGNOSIS — R5382 Chronic fatigue, unspecified: Secondary | ICD-10-CM

## 2018-04-03 DIAGNOSIS — E782 Mixed hyperlipidemia: Secondary | ICD-10-CM

## 2018-04-03 DIAGNOSIS — R350 Frequency of micturition: Secondary | ICD-10-CM

## 2018-04-03 DIAGNOSIS — L739 Follicular disorder, unspecified: Secondary | ICD-10-CM

## 2018-04-03 LAB — MICROSCOPIC EXAMINATION

## 2018-04-03 LAB — UA/M W/RFLX CULTURE, ROUTINE
BILIRUBIN UA: NEGATIVE
GLUCOSE, UA: NEGATIVE
KETONES UA: NEGATIVE
NITRITE UA: NEGATIVE
Protein, UA: NEGATIVE
SPEC GRAV UA: 1.015 (ref 1.005–1.030)
UUROB: 0.2 mg/dL (ref 0.2–1.0)
pH, UA: 6 (ref 5.0–7.5)

## 2018-04-03 LAB — MICROALBUMIN, URINE WAIVED
Creatinine, Urine Waived: 100 mg/dL (ref 10–300)
Microalb, Ur Waived: 10 mg/L (ref 0–19)
Microalb/Creat Ratio: <30 mg/g (ref <30)

## 2018-04-03 LAB — BAYER DCA HB A1C WAIVED: HB A1C (BAYER DCA - WAIVED): 5.3 % (ref <7.0)

## 2018-04-03 MED ORDER — SULFAMETHOXAZOLE-TRIMETHOPRIM 800-160 MG PO TABS: 1.0000 | ORAL_TABLET | Freq: Two times a day (BID) | ORAL | 0 refills | Status: DC

## 2018-04-03 MED ORDER — MIRTAZAPINE 30 MG PO TABS: 30.0000 mg | ORAL_TABLET | Freq: Every day | ORAL | 1 refills | Status: DC

## 2018-04-03 MED ORDER — ATORVASTATIN CALCIUM 20 MG PO TABS: 20.0000 mg | ORAL_TABLET | Freq: Every day | ORAL | 3 refills | Status: DC

## 2018-04-03 NOTE — Progress Notes (Signed)
BP 127/84   Pulse 66   Temp 97.6 F (36.4 C) (Oral)   Ht 5' 2.5" (1.588 m)   Wt 169 lb 9.6 oz (76.9 kg)   LMP  (LMP Unknown)   SpO2 96%   BMI 30.53 kg/m    Subjective:    Patient ID: Renee Bridges, female    DOB: 05/28/1946, 72 y.o.   MRN: [id number]  HPI: Renee Bridges is a 72 y.o. female  Chief Complaint  Patient presents with  . Depression  . Abdominal Pain    lower abd pain, itching and burning urination   HYPERTENSION / HYPERLIPIDEMIA Satisfied with current treatment? yes Duration of hypertension: chronic BP monitoring frequency: not checking BP medication side effects: no Past BP meds: none at this time Duration of hyperlipidemia: chronic Cholesterol medication side effects: no Cholesterol supplements: none Past cholesterol medications: atorvastatin Medication compliance: excellent compliance Aspirin: no Recent stressors: yes Recurrent headaches: no Visual changes: no Palpitations: no Dyspnea: no Chest pain: no Lower extremity edema: no Dizzy/lightheaded: no  URINARY SYMPTOMS Duration: Couple of weeks Dysuria: yes Urinary frequency: yes Urgency: yes Small volume voids: no Symptom severity: moderate Urinary incontinence: yes Foul odor: yes Hematuria: no Abdominal pain: yes Back pain: yes Suprapubic pain/pressure: yes Flank pain: yes Fever:  no Vomiting: no Relief with cranberry juice: no Relief with pyridium: no Status: better/worse/stable Previous urinary tract infection: yes Recurrent urinary tract infection: yes Vaginal discharge: yes Treatments attempted: increasing fluids   DEPRESSION- Going to see [facility] on 04/08/18- to have Mood status: better Satisfied with current treatment?: no Symptom severity: severe  Duration of current treatment : chronic Side effects: no Medication compliance: fair compliance Psychotherapy/counseling: no  Previous psychiatric medications: remeron Depressed mood: yes Anxious mood: yes Anhedonia:  yes Significant weight loss or gain: no Insomnia: yes hard to fall asleep Fatigue: yes Feelings of worthlessness or guilt: yes Impaired concentration/indecisiveness: yes Suicidal ideations: no Hopelessness: yes Crying spells: yes Depression screen [facility] 2/9 03/29/2018 12/28/2017 10/16/2017 09/14/2017 07/10/2017  Decreased Interest 3 3 0 2 3  Down, Depressed, Hopeless 3 3 1 3  0  PHQ - 2 Score 6 6 1 5 3   Altered sleeping 3 0 2 3 3   Tired, decreased energy 3 0 0 1 3  Change in appetite 3 2 0 1 2  Feeling bad or failure about yourself  3 2 3 3  0  Trouble concentrating 3 2 0 0 3  Moving slowly or fidgety/restless 3 3 0 0 0  Suicidal thoughts 3 0 0 2 0  PHQ-9 Score 27 15 6 15 14   Difficult doing work/chores - - - - -  Some recent data might be hidden    Relevant past medical, surgical, family and social history reviewed and updated as indicated. Interim medical history since our last visit reviewed. Allergies and medications reviewed and updated.  Review of Systems  Constitutional: Negative.   Respiratory: Negative.   Cardiovascular: Negative.   Skin: Negative.   Neurological: Negative.   Psychiatric/Behavioral: Positive for dysphoric mood. Negative for agitation, behavioral problems, confusion, decreased concentration, hallucinations, self-injury, sleep disturbance and suicidal ideas. The patient is nervous/anxious. The patient is not hyperactive.     Per HPI unless specifically indicated above     Objective:    BP 127/84   Pulse 66   Temp 97.6 F (36.4 C) (Oral)   Ht 5' 2.5" (1.588 m)   Wt 169 lb 9.6 oz (76.9 kg)   LMP  (LMP Unknown)  SpO2 96%   BMI 30.53 kg/m   Wt Readings from Last 3 Encounters:  04/03/18 169 lb 9.6 oz (76.9 kg)  03/29/18 171 lb 6.4 oz (77.7 kg)  12/28/17 173 lb 9.6 oz (78.7 kg)    Physical Exam  Constitutional: She is oriented to person, place, and time. She appears well-developed and well-nourished. No distress.  HENT:  Head: Normocephalic and  atraumatic.  Right Ear: Hearing normal.  Left Ear: Hearing normal.  Nose: Nose normal.  Eyes: Conjunctivae and lids are normal. Right eye exhibits no discharge. Left eye exhibits no discharge. No scleral icterus.  Cardiovascular: Normal rate, regular rhythm, normal heart sounds and intact distal pulses. Exam reveals no gallop and no friction rub.  No murmur heard. Pulmonary/Chest: Effort normal and breath sounds normal. No stridor. No respiratory distress. She has no wheezes. She has no rhonchi. She has no rales. She exhibits no tenderness.  Musculoskeletal: Normal range of motion.  Neurological: She is alert and oriented to person, place, and time.  Skin: Skin is warm, dry and intact. Capillary refill takes less than 2 seconds. No rash noted. She is not diaphoretic. No cyanosis or erythema. No pallor.  Psychiatric: She has a normal mood and affect. Her speech is normal and behavior is normal. Judgment and thought content normal. Cognition and memory are normal.  Nursing note and vitals reviewed.   Results for orders placed or performed in visit on 04/03/18  Microscopic Examination  Result Value Ref Range   WBC, UA 0-5 0 - 5 /hpf   RBC, UA 3-10 (A) 0 - 2 /hpf   Epithelial Cells (non renal) 0-10 0 - 10 /hpf   Renal Epithel, UA 0-10 (A) None seen /hpf   Bacteria, UA Few None seen/Few  UA/M w/rflx Culture, Routine  Result Value Ref Range   Specific Gravity, UA 1.015 1.005 - 1.030   pH, UA 6.0 5.0 - 7.5   Color, UA Yellow Yellow   Appearance Ur Hazy (A) Clear   Leukocytes, UA 1+ (A) Negative   Protein, UA Negative Negative/Trace   Glucose, UA Negative Negative   Ketones, UA Negative Negative   RBC, UA 2+ (A) Negative   Bilirubin, UA Negative Negative   Urobilinogen, Ur 0.2 0.2 - 1.0 mg/dL   Nitrite, UA Negative Negative   Microscopic Examination See below:   CBC with Differential/Platelet  Result Value Ref Range   WBC 7.3 3.4 - 10.8 x10E3/uL   RBC 4.75 3.77 - 5.28 x10E6/uL    Hemoglobin 15.4 11.1 - 15.9 g/dL   Hematocrit 16.1 (H) 09.6 - 46.6 %   MCV 99 (H) 79 - 97 fL   MCH 32.4 26.6 - 33.0 pg   MCHC 32.6 31.5 - 35.7 g/dL   RDW 04.5 40.9 - 81.1 %   Platelets 208 150 - 450 x10E3/uL   Neutrophils 58 Not Estab. %   Lymphs 32 Not Estab. %   Monocytes 8 Not Estab. %   Eos 2 Not Estab. %   Basos 0 Not Estab. %   Neutrophils Absolute 4.2 1.4 - 7.0 x10E3/uL   Lymphocytes Absolute 2.4 0.7 - 3.1 x10E3/uL   Monocytes Absolute 0.6 0.1 - 0.9 x10E3/uL   EOS (ABSOLUTE) 0.1 0.0 - 0.4 x10E3/uL   Basophils Absolute 0.0 0.0 - 0.2 x10E3/uL   Immature Granulocytes 0 Not Estab. %   Immature Grans (Abs) 0.0 0.0 - 0.1 x10E3/uL  Comprehensive metabolic panel  Result Value Ref Range   Glucose 81 65 - 99  mg/dL   BUN 7 (L) 8 - 27 mg/dL   Creatinine, Ser 4.54 (H) 0.57 - 1.00 mg/dL   GFR calc non Af Amer 53 (L) >59 mL/min/1.73   GFR calc Af Amer 61 >59 mL/min/1.73   BUN/Creatinine Ratio 7 (L) 12 - 28   Sodium 141 134 - 144 mmol/L   Potassium 4.8 3.5 - 5.2 mmol/L   Chloride 102 96 - 106 mmol/L   CO2 25 20 - 29 mmol/L   Calcium 9.7 8.7 - 10.3 mg/dL   Total Protein 7.0 6.0 - 8.5 g/dL   Albumin 4.5 3.5 - 4.8 g/dL   Globulin, Total 2.5 1.5 - 4.5 g/dL   Albumin/Globulin Ratio 1.8 1.2 - 2.2   Bilirubin Total 0.5 0.0 - 1.2 mg/dL   Alkaline Phosphatase 78 39 - 117 IU/L   AST 26 0 - 40 IU/L   ALT 21 0 - 32 IU/L  Bayer DCA Hb A1c Waived  Result Value Ref Range   HB A1C (BAYER DCA - WAIVED) 5.3 <7.0 %  Lipid Panel w/o Chol/HDL Ratio  Result Value Ref Range   Cholesterol, Total 224 (H) 100 - 199 mg/dL   Triglycerides 098 (H) 0 - 149 mg/dL   HDL 43 >11 mg/dL   VLDL Cholesterol Cal 33 5 - 40 mg/dL   LDL Calculated 914 (H) 0 - 99 mg/dL  Microalbumin, Urine Waived  Result Value Ref Range   Microalb, Ur Waived 10 0 - 19 mg/L   Creatinine, Urine Waived 100 10 - 300 mg/dL   Microalb/Creat Ratio <30 <30 mg/g  TSH  Result Value Ref Range   TSH 2.110 0.450 - 4.500 uIU/mL        Assessment & Plan:   Problem List Items Addressed This Visit      Cardiovascular and Mediastinum   Essential hypertension    Under good control off medicine. Continue current regimen. Continue to monitor. Call with any concerns.       Relevant Medications   atorvastatin (LIPITOR) 20 MG tablet   Other Relevant Orders   Comprehensive metabolic panel (Completed)   Microalbumin, Urine Waived (Completed)     Genitourinary   Chronic kidney disease, stage 3 (HCC)    Rechecking levels today. Await results. Call with any concerns.       Relevant Orders   CBC with Differential/Platelet (Completed)   Comprehensive metabolic panel (Completed)   Microalbumin, Urine Waived (Completed)     Other   Chronic fatigue    Checking labs today. Await results. Call with any concerns. Continue to monitor.       Relevant Orders   CBC with Differential/Platelet (Completed)   Comprehensive metabolic panel (Completed)   TSH (Completed)   Depression, major, single episode, severe (HCC)    Not under good control. To see [facility] next week. Call with any concerns.       Relevant Medications   mirtazapine (REMERON) 30 MG tablet   Other Relevant Orders   Comprehensive metabolic panel (Completed)   TSH (Completed)   Hyperlipidemia    Rechecking levels today. Await results. Call with any concerns. Refills given today.      Relevant Medications   atorvastatin (LIPITOR) 20 MG tablet   Other Relevant Orders   Lipid Panel w/o Chol/HDL Ratio (Completed)    Other Visit Diagnoses    Acute cystitis with hematuria    -  Primary   Will treat with bactrim. Call if not getting better or getting worse.  Dysuria       +UTI- will treat.    Relevant Orders   UA/M w/rflx Culture, Routine (Completed)   Urine Culture   Urinary frequency       Relevant Orders   Bayer DCA Hb A1c Waived (Completed)   Folliculitis       Will treat with bactrim. Call with any concerns.        Follow up plan: Return 2-3  months, for follow up.

## 2018-04-04 ENCOUNTER — Encounter: Payer: Self-pay | Admitting: Family Medicine

## 2018-04-04 LAB — COMPREHENSIVE METABOLIC PANEL
ALK PHOS: 78 IU/L (ref 39–117)
ALT: 21 IU/L (ref 0–32)
AST: 26 IU/L (ref 0–40)
Albumin/Globulin Ratio: 1.8 (ref 1.2–2.2)
Albumin: 4.5 g/dL (ref 3.5–4.8)
BUN/Creatinine Ratio: 7 — ABNORMAL LOW (ref 12–28)
BUN: 7 mg/dL — AB (ref 8–27)
Bilirubin Total: 0.5 mg/dL (ref 0.0–1.2)
CO2: 25 mmol/L (ref 20–29)
Calcium: 9.7 mg/dL (ref 8.7–10.3)
Chloride: 102 mmol/L (ref 96–106)
Creatinine, Ser: 1.04 mg/dL — ABNORMAL HIGH (ref 0.57–1.00)
GFR calc Af Amer: 61 mL/min/{1.73_m2} (ref >59)
GFR calc non Af Amer: 53 mL/min/{1.73_m2} — ABNORMAL LOW (ref >59)
GLUCOSE: 81 mg/dL (ref 65–99)
Globulin, Total: 2.5 g/dL (ref 1.5–4.5)
Potassium: 4.8 mmol/L (ref 3.5–5.2)
Sodium: 141 mmol/L (ref 134–144)
Total Protein: 7 g/dL (ref 6.0–8.5)

## 2018-04-04 LAB — LIPID PANEL W/O CHOL/HDL RATIO
CHOLESTEROL TOTAL: 224 mg/dL — AB (ref 100–199)
HDL: 43 mg/dL (ref >39)
LDL Calculated: 148 mg/dL — ABNORMAL HIGH (ref 0–99)
TRIGLYCERIDES: 166 mg/dL — AB (ref 0–149)
VLDL CHOLESTEROL CAL: 33 mg/dL (ref 5–40)

## 2018-04-04 LAB — CBC WITH DIFFERENTIAL/PLATELET
BASOS ABS: 0 10*3/uL (ref 0.0–0.2)
Basos: 0 %
EOS (ABSOLUTE): 0.1 10*3/uL (ref 0.0–0.4)
Eos: 2 %
Hematocrit: 47.2 % — ABNORMAL HIGH (ref 34.0–46.6)
Hemoglobin: 15.4 g/dL (ref 11.1–15.9)
Immature Grans (Abs): 0 10*3/uL (ref 0.0–0.1)
Immature Granulocytes: 0 %
LYMPHS ABS: 2.4 10*3/uL (ref 0.7–3.1)
Lymphs: 32 %
MCH: 32.4 pg (ref 26.6–33.0)
MCHC: 32.6 g/dL (ref 31.5–35.7)
MCV: 99 fL — ABNORMAL HIGH (ref 79–97)
Monocytes Absolute: 0.6 10*3/uL (ref 0.1–0.9)
Monocytes: 8 %
Neutrophils Absolute: 4.2 10*3/uL (ref 1.4–7.0)
Neutrophils: 58 %
PLATELETS: 208 10*3/uL (ref 150–450)
RBC: 4.75 x10E6/uL (ref 3.77–5.28)
RDW: 13.4 % (ref 12.3–15.4)
WBC: 7.3 10*3/uL (ref 3.4–10.8)

## 2018-04-04 LAB — TSH: TSH: 2.11 u[IU]/mL (ref 0.450–4.500)

## 2018-04-04 NOTE — Assessment & Plan Note (Signed)
Not under good control. To see [facility] next week. Call with any concerns.

## 2018-04-04 NOTE — Assessment & Plan Note (Signed)
Rechecking levels today. Await results. Call with any concerns. Refills given today. 

## 2018-04-04 NOTE — Assessment & Plan Note (Signed)
Under good control off medicine. Continue current regimen. Continue to monitor. Call with any concerns.  

## 2018-04-04 NOTE — Assessment & Plan Note (Signed)
Checking labs today. Await results. Call with any concerns. Continue to monitor.  

## 2018-04-04 NOTE — Assessment & Plan Note (Signed)
Rechecking levels today. Await results. Call with any concerns.  

## 2018-04-05 LAB — URINE CULTURE

## 2018-04-08 ENCOUNTER — Ambulatory Visit: Payer: Self-pay

## 2018-04-08 NOTE — Telephone Encounter (Signed)
Pt. Reports she is having a reaction to the Bactrim she started last week. After she takes it, she breaks out in a sweat and feels like her throat is closing up.  "After awhile, I feel a little better." Instructed to hold medication until further instructions from provider. Verbalizes understanding.  Reason for Disposition . Caller has URGENT medication question about med that PCP prescribed and triager unable to answer question  Answer Assessment - Initial Assessment Questions 1. SYMPTOMS: "Do you have any symptoms?"     After she takes the Bactrim, she sweats and feels like her throat is closing.Instructed to hold medications. 2. SEVERITY: If symptoms are present, ask "Are they mild, moderate or severe?"     Moderate  Protocols used: MEDICATION QUESTION CALL-A-AH

## 2018-04-09 ENCOUNTER — Telehealth: Payer: Self-pay | Admitting: Family Medicine

## 2018-04-09 NOTE — Telephone Encounter (Signed)
Copied from CRM [phone]. Topic: Quick Communication - See Telephone Encounter >> Apr 09, 2018  1:44 PM Lorrine KinMcGee, Lenoria Narine B, NT wrote: CRM for notification. See Telephone encounter for: 04/09/18. Patient calling to check and see what Dr Laural Benesjohnson wanted to change the sulfamethoxazole-trimethoprim (BACTRIM DS,SEPTRA DS) 800-160 MG tablet to. Please refer to the nurse triage note from 04/08/18.  [facility] - [place], [place] - [address]

## 2018-04-10 ENCOUNTER — Ambulatory Visit: Payer: Self-pay

## 2018-04-10 MED ORDER — NITROFURANTOIN MONOHYD MACRO 100 MG PO CAPS: 100.0000 mg | ORAL_CAPSULE | Freq: Two times a day (BID) | ORAL | 0 refills | Status: DC

## 2018-04-10 NOTE — Telephone Encounter (Signed)
Pt. Called to request lab results; reviewed labs / letter from Dr. Laural BenesJohnson that was mailed to pt.  Verb. Understanding.  It was noted on 8/5 that pt. C/o swelling of throat and sweating.  Pt. Denied any further sensation of swelling of the throat.  Stated she feels she is sweating, at times, due to the heat.  Made pt. aware that she was to stop the Bactrim, and to start Macrobid, as ordered per Roosvelt Maserachel Lane, PA.  Verb. understanding.  Reported her last dose of Bactrim was on Monday.    Pt. c/o itching of left leg in 2 separate areas; on left knee cap, and mid-shin area, of lower left leg, x 2 days.  Stated she has been scratching a lot, and has developed some swelling of the affected areas and small sores.  Reported small amt. of bleeding, from the scratching.   Also reported the start of itching of the right knee area.  Reviewed care advice for protocol on Home Care of localized redness and itching of legs.  Advised since she has developed small sores of left leg, she should wash affected area with soap and water, rinse well, and apply Hydrocortisone 1% cream 3 x/day, to control the itching.  Also, advised to take Benadryl 25 mg. orally, per directions on package, for the itching.  Encouraged to call back with worsening of symptoms.  Will send to Dr. Laural BenesJohnson and make her aware of pt's. symptoms.         Reason for Disposition . Mild localized itching  Answer Assessment - Initial Assessment Questions 1. DESCRIPTION: "Describe the itching you are having." "Where is it located?"     C/o itching at left knee cap and area of left lower leg   2. SEVERITY: "How bad is it?"    - MILD - doesn't interfere with normal activities   - MODERATE - SEVERE: interferes with work, school, sleep, or other activities      Moderate  3. SCRATCHING: "Are there any scratch marks? Bleeding?"     Sores and redness at site of irritation  4. ONSET: "When did the itching begin?"      2 days ago 5. CAUSE: "What do you think  is causing the itching?"      Recently started an antibiotic for UTI   6. OTHER SYMPTOMS: "Do you have any other symptoms?"      Left LE localized areas of redness and swelling; small sores where pt. was scratching. C/o new itching on the right knee today 7. PREGNANCY: "Is there any chance you are pregnant?" "When was your last menstrual period?"     n/a  Protocols used: ITCHING - LOCALIZED-A-AH

## 2018-04-10 NOTE — Telephone Encounter (Signed)
Added bactrim to allergy list, sent macrobid for her to start. Looks like urine culture showed no growth but just in case, will have her complete a full round of antibiotics

## 2018-04-10 NOTE — Telephone Encounter (Signed)
Called and left patient a VM asking for her to please return my call. OK for PEC to relay Rachel's message to the patient regarding urine results and medication.

## 2018-04-11 NOTE — Telephone Encounter (Signed)
If not better yet, should be seen.

## 2018-04-11 NOTE — Telephone Encounter (Signed)
Per review of chart, bactrim discontinued and macrobid started.

## 2018-04-11 NOTE — Telephone Encounter (Signed)
Patient states that she is getting better, informed patient that if she gets bad again she will need an appointment to be seen.

## 2018-04-11 NOTE — Telephone Encounter (Signed)
Message relayed to patient. Verbalized understanding and denied questions.   

## 2018-05-10 ENCOUNTER — Ambulatory Visit: Payer: Medicare Other

## 2018-05-30 ENCOUNTER — Ambulatory Visit: Payer: Medicare Other | Admitting: Family Medicine

## 2018-06-07 ENCOUNTER — Ambulatory Visit: Payer: Medicare Other | Admitting: Family Medicine

## 2018-06-10 ENCOUNTER — Encounter: Payer: Self-pay | Admitting: Family Medicine

## 2018-06-10 ENCOUNTER — Ambulatory Visit (INDEPENDENT_AMBULATORY_CARE_PROVIDER_SITE_OTHER): Payer: Medicare Other | Admitting: Family Medicine

## 2018-06-10 VITALS — BP 130/84 | HR 68 | Temp 98.1°F | Ht 62.5 in | Wt 173.0 lb

## 2018-06-10 DIAGNOSIS — Z23 Encounter for immunization: Secondary | ICD-10-CM | POA: Diagnosis not present

## 2018-06-10 DIAGNOSIS — N390 Urinary tract infection, site not specified: Secondary | ICD-10-CM | POA: Diagnosis not present

## 2018-06-10 LAB — UA/M W/RFLX CULTURE, ROUTINE
BILIRUBIN UA: NEGATIVE
Glucose, UA: NEGATIVE
KETONES UA: NEGATIVE
Nitrite, UA: NEGATIVE
PROTEIN UA: NEGATIVE
SPEC GRAV UA: 1.01 (ref 1.005–1.030)
UUROB: 0.2 mg/dL (ref 0.2–1.0)
pH, UA: 6.5 (ref 5.0–7.5)

## 2018-06-10 LAB — MICROSCOPIC EXAMINATION

## 2018-06-10 MED ORDER — TIZANIDINE HCL 4 MG PO TABS: 4.0000 mg | ORAL_TABLET | Freq: Every evening | ORAL | 0 refills | Status: DC | PRN

## 2018-06-10 MED ORDER — CIPROFLOXACIN HCL 250 MG PO TABS: 250.0000 mg | ORAL_TABLET | Freq: Two times a day (BID) | ORAL | 0 refills | Status: DC

## 2018-06-10 NOTE — Progress Notes (Signed)
BP 130/84   Pulse 68   Temp 98.1 F (36.7 C)   Ht 5' 2.5" (1.588 m)   Wt 173 lb (78.5 kg)   LMP  (LMP Unknown)   SpO2 99%   BMI 31.14 kg/m    Subjective:    Patient ID: Renee Bridges, female    DOB: [birth year], 72 y.o.   MRN: [id number]  HPI: Renee Bridges is a 72 y.o. female  Chief Complaint  Patient presents with  . Back Pain    Patient states that she is out of her kidney medication(antibiotics) and now her back is hurting  . Medication Refill    Tizanidine   Left low back pain b/l for several weeks. This has been ongoing but worsening x 2 months since completing macrobid for a UTI. Some dysuria, urinary frequency since being off abx as well. No fevers, chills, abdominal pain, N/V/D. Felt better on the abx but now it's coming back.   Relevant past medical, surgical, family and social history reviewed and updated as indicated. Interim medical history since our last visit reviewed. Allergies and medications reviewed and updated.  Review of Systems  Per HPI unless specifically indicated above     Objective:    BP 130/84   Pulse 68   Temp 98.1 F (36.7 C)   Ht 5' 2.5" (1.588 m)   Wt 173 lb (78.5 kg)   LMP  (LMP Unknown)   SpO2 99%   BMI 31.14 kg/m   Wt Readings from Last 3 Encounters:  06/10/18 173 lb (78.5 kg)  04/03/18 169 lb 9.6 oz (76.9 kg)  03/29/18 171 lb 6.4 oz (77.7 kg)    Physical Exam  Constitutional: She appears well-developed and well-nourished.  HENT:  Head: Atraumatic.  Eyes: Conjunctivae and EOM are normal.  Neck: Normal range of motion. Neck supple.  Cardiovascular: Normal rate and regular rhythm.  Pulmonary/Chest: Effort normal and breath sounds normal.  Musculoskeletal: Normal range of motion. She exhibits no tenderness (left lateral low back ttp).  Neurological: She is alert.  Skin: Skin is warm and dry.  Psychiatric: She has a normal mood and affect. Her behavior is normal.  Nursing note and vitals reviewed.   Results for orders  placed or performed in visit on 06/10/18  Urine Culture  Result Value Ref Range   Urine Culture, Routine Final report    Organism ID, Bacteria Comment   Microscopic Examination  Result Value Ref Range   WBC, UA 11-30 (A) 0 - 5 /hpf   RBC, UA 3-10 (A) 0 - 2 /hpf   Epithelial Cells (non renal) 0-10 0 - 10 /hpf   Renal Epithel, UA 0-10 (A) None seen /hpf   Bacteria, UA Few None seen/Few  UA/M w/rflx Culture, Routine  Result Value Ref Range   Specific Gravity, UA 1.010 1.005 - 1.030   pH, UA 6.5 5.0 - 7.5   Color, UA Yellow Yellow   Appearance Ur Cloudy (A) Clear   Leukocytes, UA 2+ (A) Negative   Protein, UA Negative Negative/Trace   Glucose, UA Negative Negative   Ketones, UA Negative Negative   RBC, UA 2+ (A) Negative   Bilirubin, UA Negative Negative   Urobilinogen, Ur 0.2 0.2 - 1.0 mg/dL   Nitrite, UA Negative Negative   Microscopic Examination See below:       Assessment & Plan:   Problem List Items Addressed This Visit    None    Visit Diagnoses    Acute  lower UTI    -  Primary   U/A + for UTI, will tx with cipro, probiotics. Push fluids. Await cx. F/u if not feeling much better   Relevant Orders   UA/M w/rflx Culture, Routine (Completed)   Urine Culture (Completed)   Immunization due       Relevant Orders   Flu vaccine HIGH DOSE PF (Fluzone High dose) (Completed)       Follow up plan: Return for as scheduled.

## 2018-06-11 ENCOUNTER — Telehealth: Payer: Self-pay | Admitting: Family Medicine

## 2018-06-11 NOTE — Telephone Encounter (Signed)
Spoke to pt to try and resched AWV she stated that she was not feeling well today and would like a call back this afternoon please to discuss side effects from flu shot and her tiZANidine (ZANAFLEX) 4 MG tablet  She is going back to bed now at 11am asked that we call her in the afternoon. Thanks! Manuela Schwartz

## 2018-06-11 NOTE — Telephone Encounter (Signed)
Called patient, left a message asking patient to return the call if she still needs to speak with [place].

## 2018-06-12 LAB — URINE CULTURE

## 2018-06-13 NOTE — Telephone Encounter (Signed)
Called patient, no answer, left a message for patient to return my call.  

## 2018-06-13 NOTE — Telephone Encounter (Signed)
Left message on machine for pt to return call to the office. Will close encounter until patient patient reaches back out w/ concerns.

## 2018-06-13 NOTE — Patient Instructions (Signed)
Follow up as scheduled.  

## 2018-06-13 NOTE — Telephone Encounter (Signed)
Spoke with patient. She states she just had the flu shot and now she's sick. Advised that if she doesn't feel better in the next day or so to call the office to schedule acute visit.

## 2018-06-13 NOTE — Telephone Encounter (Signed)
Pt called back, she didn't have hearing aids in but does now - please call back.

## 2018-06-27 ENCOUNTER — Telehealth: Payer: Self-pay | Admitting: Family Medicine

## 2018-06-27 NOTE — Telephone Encounter (Signed)
Looks like she saw you. 

## 2018-06-27 NOTE — Telephone Encounter (Unsigned)
Copied from CRM [id number]. Topic: Quick Communication - See Telephone Encounter >> Jun 27, 2018  2:11 PM Floria Raveling A wrote: CRM for notification. See Telephone encounter for: 06/27/18.  Pt called in and stated that she feels like she still has a uti.  She was seen on 10/7.  She feels like it has not gone completely away.  She stated it starting burning again last night.  She would like to know if meds can be refilled without coming in?   She also stated that she has a little bit of a cold every since she got the flu shot.  She wanted me to let Dr Laural Benes know that as well   Pharmacy [facility]  Best number [phone]

## 2018-06-27 NOTE — Telephone Encounter (Signed)
Pt called in to follow up on message left. Pt would like to if her PCP could send something in to the pharmacy for a cold? Pt says that she has been having flu like symptoms.

## 2018-06-28 NOTE — Telephone Encounter (Signed)
Called pt for clarification and left VM to return call - confused about whether she's having urinary sxs or URI sxs. Either way she should be seen as her urine culture was negative for abnormal bacteria at last visit.

## 2018-06-28 NOTE — Telephone Encounter (Signed)
Spoke with patient. Explained there was confusion on whether she meant URI or UTI.  She meant upper resp. She stated she felt a little better and thinks a lot of it has to do with allergies. She's taking allergy medicine and that seems to be helping.   Patient stated she that was told she had a UTI or symptoms, even though I tried to explain to her the culture didn't grow any bacteria. Patient stated "is she going to send me something in to keep my kidney's going."   I explained to patient Fleet Contras advised for her to be seen. Patient stated she'd rather wait and if anything got worse she'd call back. Explained to patient that if Fleet Contras had anymore to advise I'd call her and let her know what was said.   Routing to provider as Lorain Childes.

## 2018-07-13 IMAGING — RF DG THORACIC SPINE 2V
1 series · 3 of 3 positions shown · non-contrast
Comparison: Thoracic MRI 12/20/2016

CLINICAL DATA: Spinal stimulator placement

EXAM:
DG C-ARM 61-120 MIN; THORACIC SPINE 2 VIEWS

[Series 1: run · 3 of 3 slices shown]
[im 1/3]
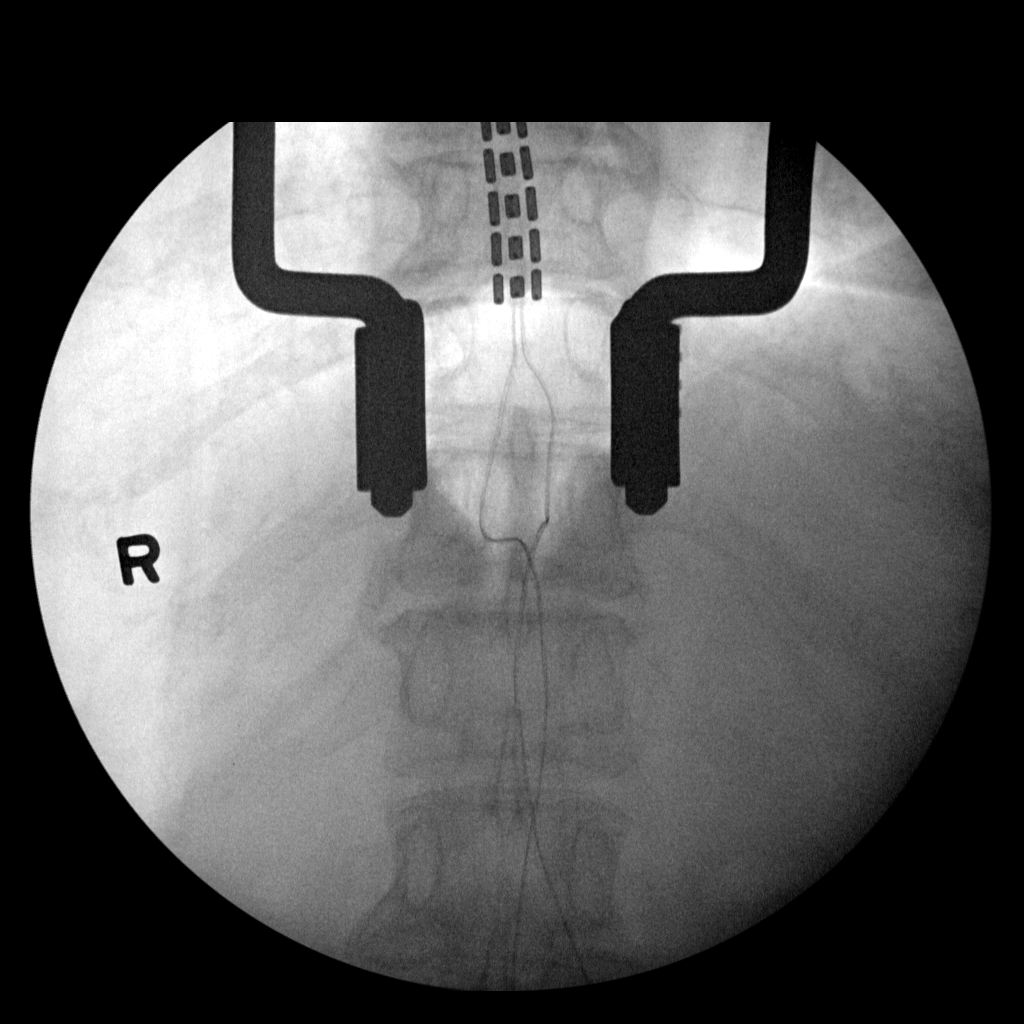
[im 2/3]
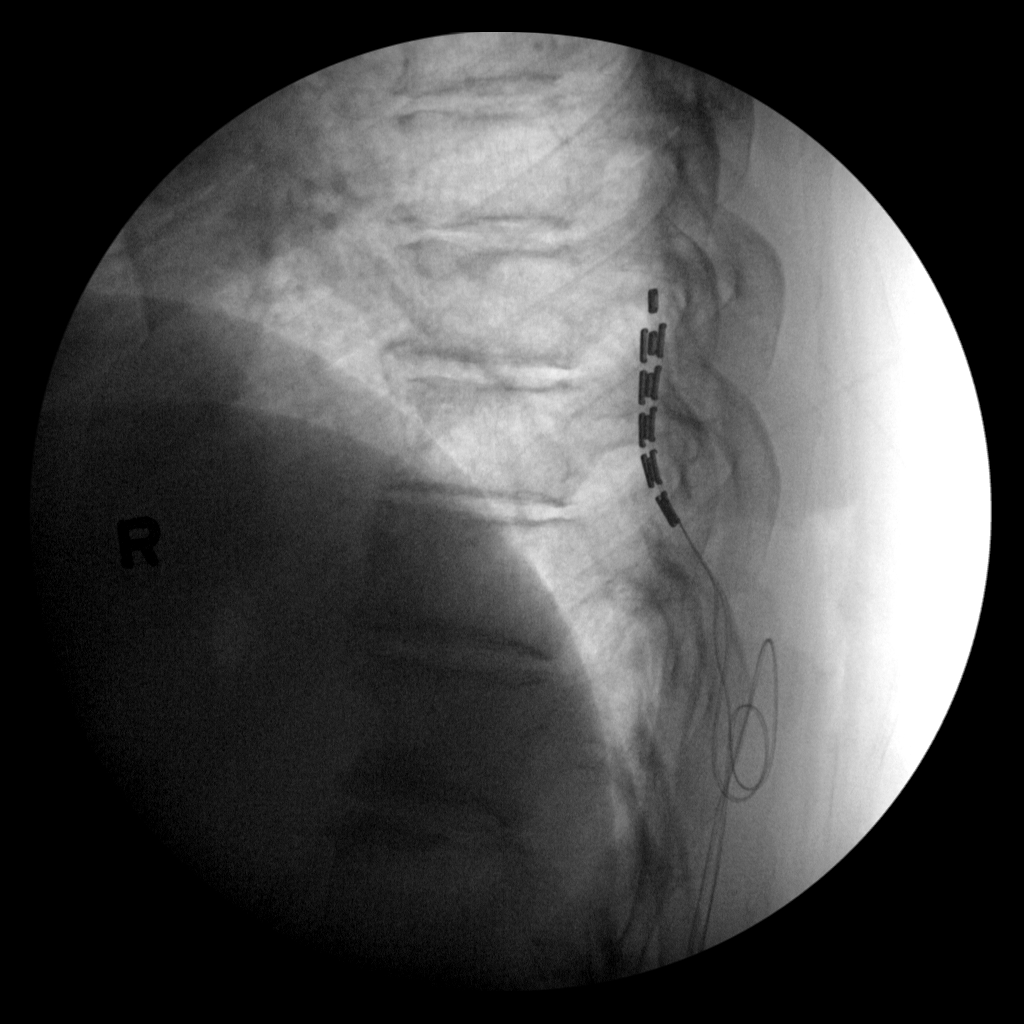
[im 3/3]
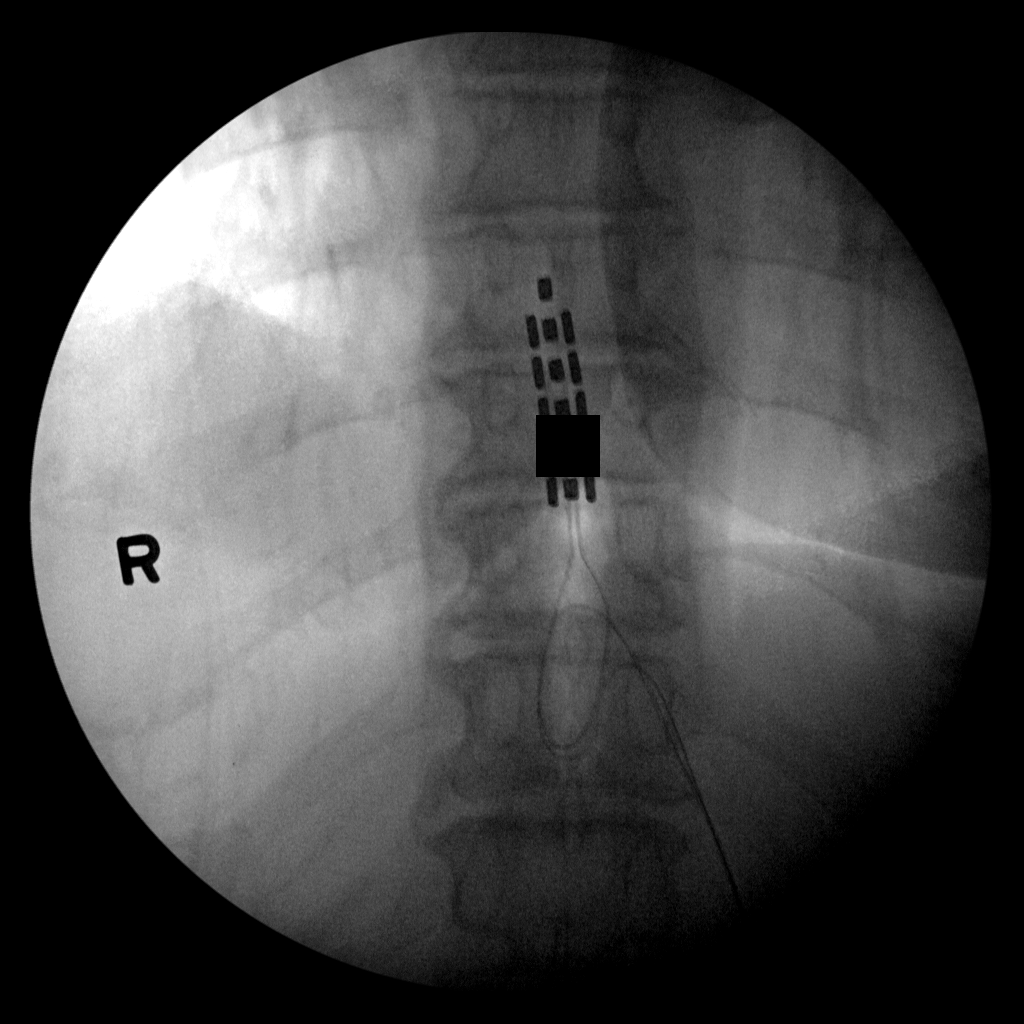

[3 of 3 positions shown; findings below may reference images not displayed]

FINDINGS: AP and lateral C-arm images of the thoracic spine joint placement of
spinal cord stimulator in the dorsal spinal canal. The level of the
stimulator cannot be determined on these images and follow-up
radiographs suggested.
IMPRESSION: Dorsal thoracic spinal cord stimulator placement as above.

## 2018-09-09 ENCOUNTER — Ambulatory Visit: Payer: Self-pay | Admitting: *Deleted

## 2018-09-09 ENCOUNTER — Ambulatory Visit (INDEPENDENT_AMBULATORY_CARE_PROVIDER_SITE_OTHER): Payer: Medicare Other | Admitting: Nurse Practitioner

## 2018-09-09 ENCOUNTER — Ambulatory Visit
Admission: RE | Admit: 2018-09-09 | Discharge: 2018-09-09 | Disposition: A | Discharge status: Home / Self Care | Payer: Medicare Other | Source: Ambulatory Visit | Attending: Nurse Practitioner | Admitting: Nurse Practitioner

## 2018-09-09 ENCOUNTER — Encounter: Payer: Self-pay | Admitting: Nurse Practitioner

## 2018-09-09 ENCOUNTER — Other Ambulatory Visit: Payer: Self-pay

## 2018-09-09 VITALS — BP 138/80 | HR 75 | Temp 97.5°F | Ht 64.0 in | Wt 171.0 lb

## 2018-09-09 DIAGNOSIS — R05 Cough: Secondary | ICD-10-CM

## 2018-09-09 DIAGNOSIS — R059 Cough, unspecified: Secondary | ICD-10-CM | POA: Insufficient documentation

## 2018-09-09 MED ORDER — DOXYCYCLINE HYCLATE 100 MG PO TABS: 100.0000 mg | ORAL_TABLET | Freq: Two times a day (BID) | ORAL | 0 refills | Status: DC

## 2018-09-09 MED ORDER — BENZONATATE 200 MG PO CAPS: 200.0000 mg | ORAL_CAPSULE | Freq: Two times a day (BID) | ORAL | 0 refills | Status: DC | PRN

## 2018-09-09 MED ORDER — ALBUTEROL SULFATE (2.5 MG/3ML) 0.083% IN NEBU: 2.5000 mg | INHALATION_SOLUTION | Freq: Four times a day (QID) | RESPIRATORY_TRACT | 1 refills | Status: DC | PRN

## 2018-09-09 MED ORDER — PREDNISONE 10 MG PO TABS: 30.0000 mg | ORAL_TABLET | Freq: Every day | ORAL | 0 refills | Status: DC

## 2018-09-09 NOTE — Telephone Encounter (Signed)
Unable to get pt. Will continue to call.

## 2018-09-09 NOTE — Patient Instructions (Signed)
Acute Bronchitis, Adult Acute bronchitis is when air tubes (bronchi) in the lungs suddenly get swollen. The condition can make it hard to breathe. It can also cause these symptoms:  A cough.  Coughing up clear, yellow, or green mucus.  Wheezing.  Chest congestion.  Shortness of breath.  A fever.  Body aches.  Chills.  A sore throat. Follow these instructions at home:  Medicines  Take over-the-counter and prescription medicines only as told by your doctor.  If you were prescribed an antibiotic medicine, take it as told by your doctor. Do not stop taking the antibiotic even if you start to feel better. General instructions  Rest.  Drink enough fluids to keep your pee (urine) pale yellow.  Avoid smoking and secondhand smoke. If you smoke and you need help quitting, ask your doctor. Quitting will help your lungs heal faster.  Use an inhaler, cool mist vaporizer, or humidifier as told by your doctor.  Keep all follow-up visits as told by your doctor. This is important. How is this prevented? To lower your risk of getting this condition again:  Wash your hands often with soap and water. If you cannot use soap and water, use hand sanitizer.  Avoid contact with people who have cold symptoms.  Try not to touch your hands to your mouth, nose, or eyes.  Make sure to get the flu shot every year. Contact a doctor if:  Your symptoms do not get better in 2 weeks. Get help right away if:  You cough up blood.  You have chest pain.  You have very bad shortness of breath.  You become dehydrated.  You faint (pass out) or keep feeling like you are going to pass out.  You keep throwing up (vomiting).  You have a very bad headache.  Your fever or chills gets worse. This information is not intended to replace advice given to you by your health care provider. Make sure you discuss any questions you have with your health care provider. Document Released: 02/07/2008 Document  Revised: 04/04/2017 Document Reviewed: 02/09/2016 Elsevier Interactive Patient Education  2019 [employer]  

## 2018-09-09 NOTE — Telephone Encounter (Signed)
Called patient. Scheduled for 3:45 today with Dr. Aura Dials, DNP. Will forwarded to her as FYI.

## 2018-09-09 NOTE — Telephone Encounter (Signed)
Contacted pt regarding symptoms; she has complaints of a cough since 08/24/18; she complaints of congestion in her chest and head; she has night-time cough medicine with no relief; she also has been sneezing; runny nose; she says that she is coughing up gray and white secretions; she has nebulizer but "the medicine is so old it will not break it up"; the pt says that her cough is worse at night. But she coughs all the time; recommendations made per nurse triage protocol; the pt declines being seen in the office as previously advised; she also states that her transportation requires a 5 day notice for transport; offered to speak with transportation for the pt but she declines; she says that she does not feel like going anywhere; ultimately she would like to have cough syrup called into [facility]; the pt can be contacted at [phone]; she will call back once she has talked with transportation; the pt is normally seen by Dr Olevia Perches, Dossie Arbour; will route to office for notification, and final  disposition.  Reason for Disposition . Cough has been present for > 3 weeks  Answer Assessment - Initial Assessment Questions 1. ONSET: "When did the cough begin?"      08/24/2018 2. SEVERITY: "How bad is the cough today?"      Moderate to severe 3. RESPIRATORY DISTRESS: "Describe your breathing."      Breathing ok 4. FEVER: "Do you have a fever?" If so, ask: "What is your temperature, how was it measured, and when did it start?"     no 5. SPUTUM: "Describe the color of your sputum" (clear, white, yellow, green)     Gray and white 6. HEMOPTYSIS: "Are you coughing up any blood?" If so ask: "How much?" (flecks, streaks, tablespoons, etc.)     Yes, once with flecks 7. CARDIAC HISTORY: "Do you have any history of heart disease?" (e.g., heart attack, congestive heart failure)      no 8. LUNG HISTORY: "Do you have any history of lung disease?"  (e.g., pulmonary embolus, asthma, emphysema)  History of bronchitis, "born with bad lungs"  9. PE RISK FACTORS: "Do you have a history of blood clots?" (or: recent major surgery, recent prolonged travel, bedridden)     no 10. OTHER SYMPTOMS: "Do you have any other symptoms?" (e.g., runny nose, wheezing, chest pain)       Chest and head congestion, runny nose  11. PREGNANCY: "Is there any chance you are pregnant?" "When was your last menstrual period?"       no 12. TRAVEL: "Have you traveled out of the country in the last month?" (e.g., travel history, exposures)       no  Protocols used: COUGH - ACUTE PRODUCTIVE-A-AH

## 2018-09-09 NOTE — Telephone Encounter (Signed)
Needs appointment ASAP or should go to urgent care. Cannot call in cough medicine without being seen.

## 2018-09-09 NOTE — Progress Notes (Signed)
BP 138/80 (BP Location: Left Arm, Patient Position: Sitting)   Pulse 75   Temp (!) 97.5 F (36.4 C) (Oral)   Ht 5\' 4"  (1.626 m)   Wt 171 lb (77.6 kg)   LMP  (LMP Unknown)   SpO2 95%   BMI 29.35 kg/m    Subjective:    Patient ID: Renee Bridges, female    DOB: 1945-12-18, 73 y.o.   MRN: [id number]  HPI: Renee Bridges is a 73 y.o. female presents for acute visit  Chief Complaint  Patient presents with  . Cough    x 2 weeks/ tried OTC med nyquil  . Generalized Body Aches    pt stateas having back and shoulders achy   UPPER RESPIRATORY TRACT INFECTION Started 08/29/18 after "burying" her brother.  She reports she choked on cookie dough ice cream on the 25th and the cough she reports started after this.  States she was watching [employer] game at time and was "yelling at [facility]" when she choked on ice cream.  Last abx treatment was with Cipro in October for UTI.  At baseline she reports no issues with dysphagia, her granddaughter reports she gets "very excited with [employer] games". Worst symptom: cough Fever: no Cough: yes Shortness of breath: no Wheezing: no Chest pain: yes, with cough Chest tightness: yes Chest congestion: yes Nasal congestion: no Runny nose: yes Post nasal drip: yes Sneezing: yes Sore throat: a little bit Swollen glands: no Sinus pressure: yes Headache: occasional with cold Face pain: no Toothache: no Ear pain: none Ear pressure: yes bilateral Eyes red/itching:no Eye drainage/crusting: no  Vomiting: no Rash: no Fatigue: yes Sick contacts: no Strep contacts: no  Context: worse Recurrent sinusitis: no Relief with OTC cold/cough medications: no  Treatments attempted: "old nebulizers", cold/sinus and mucinex   Relevant past medical, surgical, family and social history reviewed and updated as indicated. Interim medical history since our last visit reviewed. Allergies and medications reviewed and updated.  Review of Systems  Constitutional: Positive for  fatigue. Negative for activity change, appetite change and fever.  HENT: Positive for postnasal drip, rhinorrhea, sneezing and sore throat. Negative for congestion, ear discharge, ear pain, facial swelling, sinus pressure, sinus pain and voice change.   Eyes: Negative for pain and visual disturbance.  Respiratory: Positive for cough and chest tightness. Negative for shortness of breath and wheezing.   Cardiovascular: Negative for chest pain, palpitations and leg swelling.  Gastrointestinal: Negative for abdominal distention, abdominal pain, constipation, diarrhea, nausea and vomiting.  Endocrine: Negative.   Musculoskeletal: Positive for myalgias.  Neurological: Negative for dizziness, syncope, weakness, light-headedness, numbness and headaches.  Psychiatric/Behavioral: Negative.     Per HPI unless specifically indicated above     Objective:    BP 138/80 (BP Location: Left Arm, Patient Position: Sitting)   Pulse 75   Temp (!) 97.5 F (36.4 C) (Oral)   Ht 5\' 4"  (1.626 m)   Wt 171 lb (77.6 kg)   LMP  (LMP Unknown)   SpO2 95%   BMI 29.35 kg/m   Wt Readings from Last 3 Encounters:  09/09/18 171 lb (77.6 kg)  06/10/18 173 lb (78.5 kg)  04/03/18 169 lb 9.6 oz (76.9 kg)    Physical Exam Vitals signs and nursing note reviewed.  Constitutional:      General: She is awake.     Appearance: She is well-developed. She is ill-appearing.  HENT:     Head: Normocephalic. No raccoon eyes.     Right  Ear: Hearing, tympanic membrane, ear canal and external ear normal.     Left Ear: Hearing, tympanic membrane, ear canal and external ear normal.     Ears:     Comments: Small amount cerumen in both ears, able to visualize TM (bony landmarks noted).    Nose: Mucosal edema and rhinorrhea present. Rhinorrhea is clear.     Right Sinus: No maxillary sinus tenderness or frontal sinus tenderness.     Left Sinus: No maxillary sinus tenderness or frontal sinus tenderness.     Mouth/Throat:     Lips:  Pink.     Mouth: Mucous membranes are moist.     Pharynx: No pharyngeal swelling, oropharyngeal exudate or posterior oropharyngeal erythema.  Eyes:     General: Lids are normal.        Right eye: No discharge.        Left eye: No discharge.     Conjunctiva/sclera: Conjunctivae normal.     Pupils: Pupils are equal, round, and reactive to light.  Neck:     Musculoskeletal: Normal range of motion and neck supple.     Thyroid: No thyromegaly.     Vascular: No carotid bruit or JVD.  Cardiovascular:     Rate and Rhythm: Normal rate and regular rhythm.     Heart sounds: Normal heart sounds.  Pulmonary:     Effort: Pulmonary effort is normal.     Breath sounds: Examination of the right-upper field reveals wheezing. Examination of the left-upper field reveals wheezing. Wheezing present.     Comments: Intermittent expiratory wheezes upper lobes, clear with cough.  Clear lower lobes and RML, no rhonchi noted.  Intermittent productive cough noted with whitish phlegm. Abdominal:     General: Bowel sounds are normal.     Palpations: Abdomen is soft. There is no hepatomegaly or splenomegaly.  Lymphadenopathy:     Head:     Right side of head: No submental, submandibular or tonsillar adenopathy.     Left side of head: No submental, submandibular or tonsillar adenopathy.     Cervical: No cervical adenopathy.  Skin:    General: Skin is warm and dry.  Neurological:     Mental Status: She is alert and oriented to person, place, and time.  Psychiatric:        Attention and Perception: Attention normal.        Mood and Affect: Mood normal.        Behavior: Behavior normal. Behavior is cooperative.        Thought Content: Thought content normal.        Judgment: Judgment normal.     Results for orders placed or performed in visit on 06/10/18  Urine Culture  Result Value Ref Range   Urine Culture, Routine Final report    Organism ID, Bacteria Comment   Microscopic Examination  Result Value Ref  Range   WBC, UA 11-30 (A) 0 - 5 /hpf   RBC, UA 3-10 (A) 0 - 2 /hpf   Epithelial Cells (non renal) 0-10 0 - 10 /hpf   Renal Epithel, UA 0-10 (A) None seen /hpf   Bacteria, UA Few None seen/Few  UA/M w/rflx Culture, Routine  Result Value Ref Range   Specific Gravity, UA 1.010 1.005 - 1.030   pH, UA 6.5 5.0 - 7.5   Color, UA Yellow Yellow   Appearance Ur Cloudy (A) Clear   Leukocytes, UA 2+ (A) Negative   Protein, UA Negative Negative/Trace   Glucose, UA  Negative Negative   Ketones, UA Negative Negative   RBC, UA 2+ (A) Negative   Bilirubin, UA Negative Negative   Urobilinogen, Ur 0.2 0.2 - 1.0 mg/dL   Nitrite, UA Negative Negative   Microscopic Examination See below:       Assessment & Plan:   Problem List Items Addressed This Visit      Other   Cough - Primary    Flu swab negative A & B.  Appears bronchitis in nature, will obtain CXR d/t presence of cough x 2 weeks.  Script sent for Doxycycline BID and Prednisone 30 MG QDAY x 5 days.  Albuterol nebs refilled.  Tessalon as needed.  Recommended increase hydration at home and rest.  Claritin 10 MG once a day for rhinorrhea.  Return for worsening or continued symptoms.      Relevant Orders   Veritor Flu A/B Waived   DG Chest 2 View       Follow up plan: Return if symptoms worsen or fail to improve, for and as scheduled on 15th with Dr. Shela CommonsJ.

## 2018-09-09 NOTE — Assessment & Plan Note (Signed)
Flu swab negative A & B.  Appears bronchitis in nature, will obtain CXR d/t presence of cough x 2 weeks.  Script sent for Doxycycline BID and Prednisone 30 MG QDAY x 5 days.  Albuterol nebs refilled.  Tessalon as needed.  Recommended increase hydration at home and rest.  Claritin 10 MG once a day for rhinorrhea.  Return for worsening or continued symptoms.

## 2018-09-10 LAB — VERITOR FLU A/B WAIVED
Influenza A: NEGATIVE
Influenza B: NEGATIVE

## 2018-09-12 ENCOUNTER — Ambulatory Visit (INDEPENDENT_AMBULATORY_CARE_PROVIDER_SITE_OTHER): Payer: Medicare Other | Admitting: Family Medicine

## 2018-09-12 ENCOUNTER — Ambulatory Visit: Payer: Medicare Other | Admitting: Family Medicine

## 2018-09-12 ENCOUNTER — Encounter: Payer: Self-pay | Admitting: Family Medicine

## 2018-09-12 VITALS — BP 139/86 | HR 70 | Temp 97.5°F | Ht 64.0 in | Wt 173.3 lb

## 2018-09-12 DIAGNOSIS — R413 Other amnesia: Secondary | ICD-10-CM | POA: Diagnosis not present

## 2018-09-12 DIAGNOSIS — R21 Rash and other nonspecific skin eruption: Secondary | ICD-10-CM

## 2018-09-12 DIAGNOSIS — R05 Cough: Secondary | ICD-10-CM | POA: Diagnosis not present

## 2018-09-12 DIAGNOSIS — R059 Cough, unspecified: Secondary | ICD-10-CM

## 2018-09-12 DIAGNOSIS — F322 Major depressive disorder, single episode, severe without psychotic features: Secondary | ICD-10-CM

## 2018-09-12 LAB — UA/M W/RFLX CULTURE, ROUTINE
Bilirubin, UA: NEGATIVE
GLUCOSE, UA: NEGATIVE
Ketones, UA: NEGATIVE
Leukocytes, UA: NEGATIVE
Nitrite, UA: NEGATIVE
Protein, UA: NEGATIVE
Specific Gravity, UA: 1.015 (ref 1.005–1.030)
Urobilinogen, Ur: 0.2 mg/dL (ref 0.2–1.0)
pH, UA: 5.5 (ref 5.0–7.5)

## 2018-09-12 LAB — MICROSCOPIC EXAMINATION
BACTERIA UA: NONE SEEN
Epithelial Cells (non renal): NONE SEEN /hpf (ref 0–10)
WBC, UA: NONE SEEN /hpf (ref 0–5)

## 2018-09-12 MED ORDER — FLUOXETINE HCL 40 MG PO CAPS: 80.0000 mg | ORAL_CAPSULE | ORAL | 1 refills | Status: DC

## 2018-09-12 NOTE — Progress Notes (Signed)
BP 139/86 (BP Location: Left Arm, Patient Position: Sitting, Cuff Size: Large)   Pulse 70   Temp (!) 97.5 F (36.4 C) (Oral)   Ht 5\' 4"  (1.626 m)   Wt 173 lb 4.8 oz (78.6 kg)   LMP  (LMP Unknown)   SpO2 96%   BMI 29.75 kg/m    Subjective:    Patient ID: Renee Bridges, female    DOB: [birth year], 73 y.o.   MRN: [id number]  HPI: Renee Bridges is a 73 y.o. female  Chief Complaint  Patient presents with  . Memory Loss  . Rash    Patient states redness on face after starting doxycycline.   . Hypertension    Patient states her BP was 148/70 this morning and patient vomited and became shakey.   Presents today with her grand-daughter. Notes that she is forgetting conversations and having issues with short term memory for about the last few weeks. Has not had issues with paying her bills. Has a list that has been helpful. She feels like she has been doing OK, but her granddaughter definitely notices a changes. She does note that she just lost her brother. Had lost her daughter a few months ago.   DEPRESSION Mood status: uncontrolled Satisfied with current treatment?: Not on anything Symptom severity: moderate  Duration of current treatment : Not on anything Psychotherapy/counseling: no  Previous psychiatric medications: remeron Depressed mood: yes Anxious mood: yes Anhedonia: no Significant weight loss or gain: no Insomnia: yes  Fatigue: yes Feelings of worthlessness or guilt: yes Impaired concentration/indecisiveness: yes Suicidal ideations: yes Hopelessness: no Crying spells: yes Depression screen [facility] 2/9 09/13/2018 03/29/2018 12/28/2017 10/16/2017 09/14/2017  Decreased Interest 3 3 3  0 2  Down, Depressed, Hopeless 3 3 3 1 3   PHQ - 2 Score 6 6 6 1 5   Altered sleeping 2 3 0 2 3  Tired, decreased energy 3 3 0 0 1  Change in appetite 3 3 2  0 1  Feeling bad or failure about yourself  0 3 2 3 3   Trouble concentrating 0 3 2 0 0  Moving slowly or fidgety/restless 0 3 3 0 0    Suicidal thoughts 0 3 0 0 2  PHQ-9 Score 14 27 15 6 15   Difficult doing work/chores Very difficult - - - -  Some recent data might be hidden   RASH Duration:  days  Location: generalized  Itching: yes Burning: yes Redness: yes Oozing: no Scaling: no Blisters: no Painful: no Fevers: no Change in detergents/soaps/personal care products: no Recent illness: yes Recent travel:no History of same: no Context: stable Alleviating factors: nothing Treatments attempted:lotion/moisturizer Shortness of breath: no  Throat/tongue swelling: no Myalgias/arthralgias: no   MMSE - Mini Mental State Exam 09/12/2018 12/28/2017  Orientation to time 5 5  Orientation to Place 4 5  Registration 3 3  Attention/ Calculation 2 3  Recall 3 2  Language- name 2 objects 2 2  Language- repeat 0 0  Language- follow 3 step command 1 3  Language- read & follow direction 0 1  Write a sentence 0 1  Copy design 0 0  Total score 20 25    Relevant past medical, surgical, family and social history reviewed and updated as indicated. Interim medical history since our last visit reviewed. Allergies and medications reviewed and updated.  Review of Systems  Constitutional: Negative.   Respiratory: Negative.   Cardiovascular: Negative.   Musculoskeletal: Negative.   Skin: Positive for rash. Negative for  color change, pallor and wound.  Psychiatric/Behavioral: Positive for confusion, decreased concentration and dysphoric mood. Negative for agitation, behavioral problems, hallucinations, self-injury, sleep disturbance and suicidal ideas. The patient is nervous/anxious. The patient is not hyperactive.     Per HPI unless specifically indicated above     Objective:    BP 139/86 (BP Location: Left Arm, Patient Position: Sitting, Cuff Size: Large)   Pulse 70   Temp (!) 97.5 F (36.4 C) (Oral)   Ht 5\' 4"  (1.626 m)   Wt 173 lb 4.8 oz (78.6 kg)   LMP  (LMP Unknown)   SpO2 96%   BMI 29.75 kg/m   Wt Readings  from Last 3 Encounters:  09/12/18 173 lb 4.8 oz (78.6 kg)  09/09/18 171 lb (77.6 kg)  06/10/18 173 lb (78.5 kg)    Physical Exam Vitals signs and nursing note reviewed.  Constitutional:      General: She is not in acute distress.    Appearance: Normal appearance. She is not ill-appearing, toxic-appearing or diaphoretic.  HENT:     Head: Normocephalic and atraumatic.     Right Ear: External ear normal.     Left Ear: External ear normal.     Nose: Nose normal.     Mouth/Throat:     Mouth: Mucous membranes are moist.     Pharynx: Oropharynx is clear.  Eyes:     General: No scleral icterus.       Right eye: No discharge.        Left eye: No discharge.     Extraocular Movements: Extraocular movements intact.     Conjunctiva/sclera: Conjunctivae normal.     Pupils: Pupils are equal, round, and reactive to light.  Neck:     Musculoskeletal: Normal range of motion and neck supple.  Cardiovascular:     Rate and Rhythm: Normal rate and regular rhythm.     Pulses: Normal pulses.     Heart sounds: Normal heart sounds. No murmur. No friction rub. No gallop.   Pulmonary:     Effort: Pulmonary effort is normal. No respiratory distress.     Breath sounds: Normal breath sounds. No stridor. No wheezing, rhonchi or rales.  Chest:     Chest wall: No tenderness.  Musculoskeletal: Normal range of motion.  Skin:    General: Skin is warm and dry.     Capillary Refill: Capillary refill takes less than 2 seconds.     Coloration: Skin is not jaundiced or pale.     Findings: Rash (on arms, legs and face, fine papular rash) present. No bruising, erythema or lesion.  Neurological:     General: No focal deficit present.     Mental Status: She is alert and oriented to person, place, and time. Mental status is at baseline.  Psychiatric:        Mood and Affect: Mood is depressed.        Behavior: Behavior normal.        Thought Content: Thought content normal.        Judgment: Judgment normal.      Results for orders placed or performed in visit on 09/12/18  Microscopic Examination  Result Value Ref Range   WBC, UA None seen 0 - 5 /hpf   RBC, UA 0-2 0 - 2 /hpf   Epithelial Cells (non renal) None seen 0 - 10 /hpf   Bacteria, UA None seen None seen/Few  Comprehensive metabolic panel  Result Value Ref Range   Glucose  110 (H) 65 - 99 mg/dL   BUN 14 8 - 27 mg/dL   Creatinine, Ser 1.611.18 (H) 0.57 - 1.00 mg/dL   GFR calc non Af Amer 46 (L) >59 mL/min/1.73   GFR calc Af Amer 53 (L) >59 mL/min/1.73   BUN/Creatinine Ratio 12 12 - 28   Sodium 142 134 - 144 mmol/L   Potassium 5.0 3.5 - 5.2 mmol/L   Chloride 104 96 - 106 mmol/L   CO2 23 20 - 29 mmol/L   Calcium 10.0 8.7 - 10.3 mg/dL   Total Protein 7.3 6.0 - 8.5 g/dL   Albumin 4.5 3.5 - 4.8 g/dL   Globulin, Total 2.8 1.5 - 4.5 g/dL   Albumin/Globulin Ratio 1.6 1.2 - 2.2   Bilirubin Total 0.4 0.0 - 1.2 mg/dL   Alkaline Phosphatase 69 39 - 117 IU/L   AST 31 0 - 40 IU/L   ALT 20 0 - 32 IU/L  CBC with Differential/Platelet  Result Value Ref Range   WBC 11.1 (H) 3.4 - 10.8 x10E3/uL   RBC 4.83 3.77 - 5.28 x10E6/uL   Hemoglobin 15.1 11.1 - 15.9 g/dL   Hematocrit 09.644.6 04.534.0 - 46.6 %   MCV 92 79 - 97 fL   MCH 31.3 26.6 - 33.0 pg   MCHC 33.9 31.5 - 35.7 g/dL   RDW 40.912.2 81.111.7 - 91.415.4 %   Platelets 253 150 - 450 x10E3/uL   Neutrophils 77 Not Estab. %   Lymphs 18 Not Estab. %   Monocytes 3 Not Estab. %   Eos 0 Not Estab. %   Basos 1 Not Estab. %   Neutrophils Absolute 8.6 (H) 1.4 - 7.0 x10E3/uL   Lymphocytes Absolute 2.0 0.7 - 3.1 x10E3/uL   Monocytes Absolute 0.3 0.1 - 0.9 x10E3/uL   EOS (ABSOLUTE) 0.0 0.0 - 0.4 x10E3/uL   Basophils Absolute 0.1 0.0 - 0.2 x10E3/uL   Immature Granulocytes 1 Not Estab. %   Immature Grans (Abs) 0.1 0.0 - 0.1 x10E3/uL  B12 and Folate Panel  Result Value Ref Range   Vitamin B-12 327 232 - 1,245 pg/mL   Folate 10.1 >3.0 ng/mL  UA/M w/rflx Culture, Routine  Result Value Ref Range   Specific Gravity,  UA 1.015 1.005 - 1.030   pH, UA 5.5 5.0 - 7.5   Color, UA Yellow Yellow   Appearance Ur Clear Clear   Leukocytes, UA Negative Negative   Protein, UA Negative Negative/Trace   Glucose, UA Negative Negative   Ketones, UA Negative Negative   RBC, UA 1+ (A) Negative   Bilirubin, UA Negative Negative   Urobilinogen, Ur 0.2 0.2 - 1.0 mg/dL   Nitrite, UA Negative Negative   Microscopic Examination See below:   VITAMIN D 25 Hydroxy (Vit-D Deficiency, Fractures)  Result Value Ref Range   Vit D, 25-Hydroxy 22.1 (L) 30.0 - 100.0 ng/mL  Vitamin B1  Result Value Ref Range   Thiamine WILL FOLLOW   Thyroid Panel With TSH  Result Value Ref Range   TSH 2.580 0.450 - 4.500 uIU/mL   T4, Total 9.0 4.5 - 12.0 ug/dL   T3 Uptake Ratio 27 24 - 39 %   Free Thyroxine Index 2.4 1.2 - 4.9      Assessment & Plan:   Problem List Items Addressed This Visit      Other   Depression, major, single episode, severe (HCC) - Primary    Worsened with the recent death of her brother. Concern that this is exacerbating the issues  with her memory. She is willing to take prozac weekly- will start that and see how she does, increasing as needed. Rx sent to her pharmacy. Recheck 2-4 weeks.       Relevant Medications   FLUoxetine (PROZAC) 40 MG capsule   Cough    Lungs clear. Will stop doxycycline. Call wiht any concerns.        Other Visit Diagnoses    Memory loss       MMSE 20 down from 25 in April. Concern that this is due to depression. Will check labs and start weekly prozac and recheck 2-4 weeks.    Relevant Orders   Comprehensive metabolic panel (Completed)   CBC with Differential/Platelet (Completed)   B12 and Folate Panel (Completed)   UA/M w/rflx Culture, Routine (Completed)   VITAMIN D 25 Hydroxy (Vit-D Deficiency, Fractures) (Completed)   Vitamin B1 (Completed)   Thyroid Panel With TSH (Completed)   Rash       Likely from doxycycline- will stop it       Follow up plan: Return in about 4  weeks (around 10/10/2018).

## 2018-09-15 ENCOUNTER — Encounter: Payer: Self-pay | Admitting: Family Medicine

## 2018-09-15 NOTE — Assessment & Plan Note (Signed)
Worsened with the recent death of her brother. Concern that this is exacerbating the issues with her memory. She is willing to take prozac weekly- will start that and see how she does, increasing as needed. Rx sent to her pharmacy. Recheck 2-4 weeks.

## 2018-09-15 NOTE — Assessment & Plan Note (Signed)
Lungs clear. Will stop doxycycline. Call wiht any concerns.

## 2018-09-17 ENCOUNTER — Other Ambulatory Visit: Payer: Self-pay | Admitting: Family Medicine

## 2018-09-17 LAB — COMPREHENSIVE METABOLIC PANEL
ALT: 20 IU/L (ref 0–32)
AST: 31 IU/L (ref 0–40)
Albumin/Globulin Ratio: 1.6 (ref 1.2–2.2)
Albumin: 4.5 g/dL (ref 3.5–4.8)
Alkaline Phosphatase: 69 IU/L (ref 39–117)
BUN/Creatinine Ratio: 12 (ref 12–28)
BUN: 14 mg/dL (ref 8–27)
Bilirubin Total: 0.4 mg/dL (ref 0.0–1.2)
CO2: 23 mmol/L (ref 20–29)
Calcium: 10 mg/dL (ref 8.7–10.3)
Chloride: 104 mmol/L (ref 96–106)
Creatinine, Ser: 1.18 mg/dL — ABNORMAL HIGH (ref 0.57–1.00)
GFR calc Af Amer: 53 mL/min/{1.73_m2} — ABNORMAL LOW (ref >59)
GFR calc non Af Amer: 46 mL/min/{1.73_m2} — ABNORMAL LOW (ref >59)
Globulin, Total: 2.8 g/dL (ref 1.5–4.5)
Glucose: 110 mg/dL — ABNORMAL HIGH (ref 65–99)
Potassium: 5 mmol/L (ref 3.5–5.2)
SODIUM: 142 mmol/L (ref 134–144)
TOTAL PROTEIN: 7.3 g/dL (ref 6.0–8.5)

## 2018-09-17 LAB — VITAMIN B1: Thiamine: 147.8 nmol/L (ref 66.5–200.0)

## 2018-09-17 LAB — CBC WITH DIFFERENTIAL/PLATELET
Basophils Absolute: 0.1 10*3/uL (ref 0.0–0.2)
Basos: 1 %
EOS (ABSOLUTE): 0 10*3/uL (ref 0.0–0.4)
Eos: 0 %
Hematocrit: 44.6 % (ref 34.0–46.6)
Hemoglobin: 15.1 g/dL (ref 11.1–15.9)
Immature Grans (Abs): 0.1 10*3/uL (ref 0.0–0.1)
Immature Granulocytes: 1 %
Lymphocytes Absolute: 2 10*3/uL (ref 0.7–3.1)
Lymphs: 18 %
MCH: 31.3 pg (ref 26.6–33.0)
MCHC: 33.9 g/dL (ref 31.5–35.7)
MCV: 92 fL (ref 79–97)
MONOS ABS: 0.3 10*3/uL (ref 0.1–0.9)
Monocytes: 3 %
Neutrophils Absolute: 8.6 10*3/uL — ABNORMAL HIGH (ref 1.4–7.0)
Neutrophils: 77 %
Platelets: 253 10*3/uL (ref 150–450)
RBC: 4.83 x10E6/uL (ref 3.77–5.28)
RDW: 12.2 % (ref 11.7–15.4)
WBC: 11.1 10*3/uL — ABNORMAL HIGH (ref 3.4–10.8)

## 2018-09-17 LAB — THYROID PANEL WITH TSH
FREE THYROXINE INDEX: 2.4 (ref 1.2–4.9)
T3 Uptake Ratio: 27 % (ref 24–39)
T4, Total: 9 ug/dL (ref 4.5–12.0)
TSH: 2.58 u[IU]/mL (ref 0.450–4.500)

## 2018-09-17 LAB — B12 AND FOLATE PANEL
Folate: 10.1 ng/mL (ref >3.0)
Vitamin B-12: 327 pg/mL (ref 232–1245)

## 2018-09-17 LAB — VITAMIN D 25 HYDROXY (VIT D DEFICIENCY, FRACTURES): Vit D, 25-Hydroxy: 22.1 ng/mL — ABNORMAL LOW (ref 30.0–100.0)

## 2018-09-17 MED ORDER — VITAMIN D (ERGOCALCIFEROL) 1.25 MG (50000 UNIT) PO CAPS: 50000.0000 [IU] | ORAL_CAPSULE | ORAL | 0 refills | Status: DC

## 2018-09-18 ENCOUNTER — Ambulatory Visit: Payer: Medicare Other | Admitting: Family Medicine

## 2018-09-30 ENCOUNTER — Encounter: Payer: Self-pay | Admitting: Family Medicine

## 2018-09-30 ENCOUNTER — Ambulatory Visit (INDEPENDENT_AMBULATORY_CARE_PROVIDER_SITE_OTHER): Payer: Medicare Other | Admitting: Family Medicine

## 2018-09-30 VITALS — BP 154/79 | HR 76 | Temp 97.8°F | Wt 172.1 lb

## 2018-09-30 DIAGNOSIS — F322 Major depressive disorder, single episode, severe without psychotic features: Secondary | ICD-10-CM | POA: Diagnosis not present

## 2018-09-30 DIAGNOSIS — R413 Other amnesia: Secondary | ICD-10-CM | POA: Diagnosis not present

## 2018-09-30 NOTE — Progress Notes (Signed)
BP (!) 154/79 (BP Location: Left Arm, Cuff Size: Normal)   Pulse 76   Temp 97.8 F (36.6 C) (Oral)   Wt 172 lb 1.6 oz (78.1 kg)   LMP  (LMP Unknown)   SpO2 96%   BMI 29.54 kg/m    Subjective:    Patient ID: Renee MannDella S Vogelsang, female    DOB: 09/22/1945, 73 y.o.   MRN: [id number]  HPI: Renee Bridges is a [age] y.o. female  Chief Complaint  Patient presents with  . Depression   DEPRESSION- memory doing a lot better. Has been taking her medicine on Monday. Doing well  Mood status: better Satisfied with current treatment?: yes Symptom severity: mild  Duration of current treatment : weeks Side effects: no Medication compliance: good compliance Psychotherapy/counseling: no considering it Previous psychiatric medications: prozac Depressed mood: yes Anxious mood: yes Anhedonia: no Significant weight loss or gain: no Insomnia: no  Fatigue: yes Feelings of worthlessness or guilt: no Impaired concentration/indecisiveness: yes Suicidal ideations: no Hopelessness: no Crying spells: yes [facility] 09/30/2018 09/13/2018 03/29/2018 12/28/2017 10/16/2017  Decreased Interest 0 3 3 3  0  Down, Depressed, Hopeless 0 3 3 3 1   PHQ - 2 Score 0 6 6 6 1   Altered sleeping 3 2 3  0 2  Tired, decreased energy 0 3 3 0 0  Change in appetite 3 3 3 2  0  Feeling bad or failure about yourself  0 0 3 2 3   Trouble concentrating 0 0 3 2 0  Moving slowly or fidgety/restless 1 0 3 3 0  Suicidal thoughts 0 0 3 0 0  PHQ-9 Score 7 14 27 15 6   Difficult doing work/chores Somewhat difficult Very difficult - - -  Some recent data might be hidden   MMSE - Mini Mental State Exam 09/12/2018 12/28/2017  Orientation to time 5 5  Orientation to Place 4 5  Registration 3 3  Attention/ Calculation 2 3  Recall 3 2  Language- name 2 objects 2 2  Language- repeat 0 0  Language- follow 3 step command 1 3  Language- read & follow direction 0 1  Write a sentence 0 1  Copy design 0 0  Total score 20 25      Relevant past medical, surgical, family and social history reviewed and updated as indicated. Interim medical history since our last visit reviewed. Allergies and medications reviewed and updated.  Review of Systems  Constitutional: Negative.   Respiratory: Negative.   Cardiovascular: Negative.   Psychiatric/Behavioral: Positive for dysphoric mood. Negative for agitation, behavioral problems, confusion, decreased concentration, hallucinations, self-injury, sleep disturbance and suicidal ideas. The patient is nervous/anxious. The patient is not hyperactive.     Per HPI unless specifically indicated above     Objective:    BP (!) 154/79 (BP Location: Left Arm, Cuff Size: Normal)   Pulse 76   Temp 97.8 F (36.6 C) (Oral)   Wt 172 lb 1.6 oz (78.1 kg)   LMP  (LMP Unknown)   SpO2 96%   BMI 29.54 kg/m   Wt Readings from Last 3 Encounters:  09/30/18 172 lb 1.6 oz (78.1 kg)  09/12/18 173 lb 4.8 oz (78.6 kg)  09/09/18 171 lb (77.6 kg)    Physical Exam Vitals signs and nursing note reviewed.  Constitutional:      General: She is not in acute distress.    Appearance: Normal appearance. She is not ill-appearing, toxic-appearing or diaphoretic.  HENT:  Head: Normocephalic and atraumatic.     Right Ear: External ear normal.     Left Ear: External ear normal.     Nose: Nose normal.     Mouth/Throat:     Mouth: Mucous membranes are moist.     Pharynx: Oropharynx is clear.  Eyes:     General: No scleral icterus.       Right eye: No discharge.        Left eye: No discharge.     Extraocular Movements: Extraocular movements intact.     Conjunctiva/sclera: Conjunctivae normal.     Pupils: Pupils are equal, round, and reactive to light.  Neck:     Musculoskeletal: Normal range of motion and neck supple.  Cardiovascular:     Rate and Rhythm: Normal rate and regular rhythm.     Pulses: Normal pulses.     Heart sounds: Normal heart sounds. No murmur. No friction rub. No gallop.    Pulmonary:     Effort: Pulmonary effort is normal. No respiratory distress.     Breath sounds: Normal breath sounds. No stridor. No wheezing, rhonchi or rales.  Chest:     Chest wall: No tenderness.  Musculoskeletal: Normal range of motion.  Skin:    General: Skin is warm and dry.     Capillary Refill: Capillary refill takes less than 2 seconds.     Coloration: Skin is not jaundiced or pale.     Findings: No bruising, erythema, lesion or rash.  Neurological:     General: No focal deficit present.     Mental Status: She is alert and oriented to person, place, and time. Mental status is at baseline.  Psychiatric:        Mood and Affect: Mood normal.        Behavior: Behavior normal.        Thought Content: Thought content normal.        Judgment: Judgment normal.     Results for orders placed or performed in visit on 09/12/18  Microscopic Examination  Result Value Ref Range   WBC, UA None seen 0 - 5 /hpf   RBC, UA 0-2 0 - 2 /hpf   Epithelial Cells (non renal) None seen 0 - 10 /hpf   Bacteria, UA None seen None seen/Few  Comprehensive metabolic panel  Result Value Ref Range   Glucose 110 (H) 65 - 99 mg/dL   BUN 14 8 - 27 mg/dL   Creatinine, Ser 5.17 (H) 0.57 - 1.00 mg/dL   GFR calc non Af Amer 46 (L) >59 mL/min/1.73   GFR calc Af Amer 53 (L) >59 mL/min/1.73   BUN/Creatinine Ratio 12 12 - 28   Sodium 142 134 - 144 mmol/L   Potassium 5.0 3.5 - 5.2 mmol/L   Chloride 104 96 - 106 mmol/L   CO2 23 20 - 29 mmol/L   Calcium 10.0 8.7 - 10.3 mg/dL   Total Protein 7.3 6.0 - 8.5 g/dL   Albumin 4.5 3.5 - 4.8 g/dL   Globulin, Total 2.8 1.5 - 4.5 g/dL   Albumin/Globulin Ratio 1.6 1.2 - 2.2   Bilirubin Total 0.4 0.0 - 1.2 mg/dL   Alkaline Phosphatase 69 39 - 117 IU/L   AST 31 0 - 40 IU/L   ALT 20 0 - 32 IU/L  CBC with Differential/Platelet  Result Value Ref Range   WBC 11.1 (H) 3.4 - 10.8 x10E3/uL   RBC 4.83 3.77 - 5.28 x10E6/uL   Hemoglobin 15.1 11.1 -  15.9 g/dL   Hematocrit  78.244.6 95.634.0 - 46.6 %   MCV 92 79 - 97 fL   MCH 31.3 26.6 - 33.0 pg   MCHC 33.9 31.5 - 35.7 g/dL   RDW 21.312.2 08.611.7 - 57.815.4 %   Platelets 253 150 - 450 x10E3/uL   Neutrophils 77 Not Estab. %   Lymphs 18 Not Estab. %   Monocytes 3 Not Estab. %   Eos 0 Not Estab. %   Basos 1 Not Estab. %   Neutrophils Absolute 8.6 (H) 1.4 - 7.0 x10E3/uL   Lymphocytes Absolute 2.0 0.7 - 3.1 x10E3/uL   Monocytes Absolute 0.3 0.1 - 0.9 x10E3/uL   EOS (ABSOLUTE) 0.0 0.0 - 0.4 x10E3/uL   Basophils Absolute 0.1 0.0 - 0.2 x10E3/uL   Immature Granulocytes 1 Not Estab. %   Immature Grans (Abs) 0.1 0.0 - 0.1 x10E3/uL  B12 and Folate Panel  Result Value Ref Range   Vitamin B-12 327 232 - 1,245 pg/mL   Folate 10.1 >3.0 ng/mL  UA/M w/rflx Culture, Routine  Result Value Ref Range   Specific Gravity, UA 1.015 1.005 - 1.030   pH, UA 5.5 5.0 - 7.5   Color, UA Yellow Yellow   Appearance Ur Clear Clear   Leukocytes, UA Negative Negative   Protein, UA Negative Negative/Trace   Glucose, UA Negative Negative   Ketones, UA Negative Negative   RBC, UA 1+ (A) Negative   Bilirubin, UA Negative Negative   Urobilinogen, Ur 0.2 0.2 - 1.0 mg/dL   Nitrite, UA Negative Negative   Microscopic Examination See below:   VITAMIN D 25 Hydroxy (Vit-D Deficiency, Fractures)  Result Value Ref Range   Vit D, 25-Hydroxy 22.1 (L) 30.0 - 100.0 ng/mL  Vitamin B1  Result Value Ref Range   Thiamine 147.8 66.5 - 200.0 nmol/L  Thyroid Panel With TSH  Result Value Ref Range   TSH 2.580 0.450 - 4.500 uIU/mL   T4, Total 9.0 4.5 - 12.0 ug/dL   T3 Uptake Ratio 27 24 - 39 %   Free Thyroxine Index 2.4 1.2 - 4.9      Assessment & Plan:   Problem List Items Addressed This Visit      Other   Depression, major, single episode, severe (HCC) - Primary    Doing better with her weekly prozac. Continue current regimen. Continue to monitor. Call with any concerns. Recheck 3 months.        Other Visit Diagnoses    Memory loss       MMSE improved  on her prozac. Continue current regimen. Continue to monitor.        Follow up plan: Return in about 3 months (around 12/30/2018) for follow up.

## 2018-09-30 NOTE — Assessment & Plan Note (Signed)
Doing better with her weekly prozac. Continue current regimen. Continue to monitor. Call with any concerns. Recheck 3 months.

## 2018-10-08 NOTE — Telephone Encounter (Signed)
closing encounter

## 2018-12-23 ENCOUNTER — Telehealth: Payer: Self-pay | Admitting: Family Medicine

## 2018-12-23 NOTE — Telephone Encounter (Signed)
Patient's granddaughter called stating patient has had diarrhea and a cough.  She also states she has not been taking her medication correctly she thinks because she hs been very emotional.  Patient has an appointment 4/24 which granddaughter does not think she would be able to do a virtual visit and she has to work and could not bring her in the office.  Brayton Caves is the one that brings her to her appointments.  Please advise.  Thank you   Brayton Caves   [phone]

## 2018-12-24 ENCOUNTER — Ambulatory Visit (INDEPENDENT_AMBULATORY_CARE_PROVIDER_SITE_OTHER): Payer: Medicare Other | Admitting: Family Medicine

## 2018-12-24 ENCOUNTER — Encounter: Payer: Self-pay | Admitting: Family Medicine

## 2018-12-24 ENCOUNTER — Other Ambulatory Visit: Payer: Self-pay

## 2018-12-24 VITALS — BP 124/75 | HR 77 | Wt 165.0 lb

## 2018-12-24 DIAGNOSIS — F322 Major depressive disorder, single episode, severe without psychotic features: Secondary | ICD-10-CM | POA: Diagnosis not present

## 2018-12-24 DIAGNOSIS — R059 Cough, unspecified: Secondary | ICD-10-CM

## 2018-12-24 DIAGNOSIS — R1111 Vomiting without nausea: Secondary | ICD-10-CM

## 2018-12-24 DIAGNOSIS — R05 Cough: Secondary | ICD-10-CM | POA: Diagnosis not present

## 2018-12-24 MED ORDER — FLUOXETINE HCL 40 MG PO CAPS: ORAL_CAPSULE | ORAL | 1 refills | Status: DC

## 2018-12-24 NOTE — Assessment & Plan Note (Signed)
Will use her nebulizer 3x a day. Call if not getting better or getting worse. Continue to monitor.

## 2018-12-24 NOTE — Assessment & Plan Note (Signed)
In exacerbation. Willing to increase her prozac to 3x a week. Will increase and recheck in 1 month. Call with any concerns.

## 2018-12-24 NOTE — Progress Notes (Signed)
BP 124/75   Wt 165 lb (74.8 kg)   LMP  (LMP Unknown)   BMI 28.32 kg/m    Subjective:    Patient ID: Renee Bridges, female    DOB: 1946-08-09, 73 y.o.   MRN: [id number]  HPI: Renee Bridges is a 73 y.o. female  Chief Complaint  Patient presents with  . Emesis  . Cough  . Depression   Notes that she is feeling tired all the time. She is feeling a bit cooped up. She notes that she has been throwing up a bit. She has also been coughing a bit. She notes that she has been throwing up for a couple of days. It only seems to happen when she is eating. She has not been trying to lose weight, but is starting to. She has been feeling a bit full earlier. She has been having some issues with her allergies. Has been having a bit of cough and some congestion. She thinks that this is because of her allergies. She has been taking allergy pills and that has been helping.  She has not been using her nebulizer. She will occasionally use the   DEPRESSION- states that she has been taking her medicine for her mood Mood status: exacerbated Satisfied with current treatment?: yes Symptom severity: moderate  Duration of current treatment : months Side effects: no Medication compliance: fair compliance Psychotherapy/counseling: no  Previous psychiatric medications: prozac Depressed mood: yes Anxious mood: yes Anhedonia: no Significant weight loss or gain: yes Insomnia: no  Fatigue: yes Feelings of worthlessness or guilt: yes Impaired concentration/indecisiveness: yes Suicidal ideations: no Hopelessness: no Crying spells: yes Depression screen [facility] 2/9 12/24/2018 09/30/2018 09/13/2018 03/29/2018 12/28/2017  Decreased Interest 0 0 Down, Depressed, Hopeless 1 0 PHQ - 2 Score 1 0 Altered sleeping 0  Tired, decreased energy 3 0 3 3 0  Change in appetite Feeling bad or failure about yourself  0 0 0 3 2  Trouble concentrating 1 0 0 3 2  Moving slowly or  fidgety/restless 3 1 0 3 3  Suicidal thoughts 0 0 0 3 0  PHQ-9 Score Difficult doing work/chores Somewhat difficult Somewhat difficult Very difficult - -  Some recent data might be hidden    Relevant past medical, surgical, family and social history reviewed and updated as indicated. Interim medical history since our last visit reviewed. Allergies and medications reviewed and updated.  Review of Systems  Constitutional: Positive for fatigue. Negative for activity change, appetite change, chills, diaphoresis, fever and unexpected weight change.  HENT: Negative.   Respiratory: Positive for cough and shortness of breath. Negative for apnea, choking, chest tightness, wheezing and stridor.   Cardiovascular: Negative.   Skin: Negative.   Neurological: Negative.   Psychiatric/Behavioral: Positive for dysphoric mood. Negative for agitation, behavioral problems, confusion, decreased concentration, hallucinations, self-injury, sleep disturbance and suicidal ideas. The patient is not nervous/anxious and is not hyperactive.     Per HPI unless specifically indicated above     Objective:    BP 124/75   Wt 165 lb (74.8 kg)   LMP  (LMP Unknown)   BMI 28.32 kg/m   Wt Readings from Last 3 Encounters:  12/24/18 165 lb (74.8 kg)  09/30/18 172 lb 1.6 oz (78.1 kg)  09/12/18 173 lb 4.8 oz (78.6 kg)    Physical  Exam Vitals signs and nursing note reviewed.  Pulmonary:     Effort: Pulmonary effort is normal. No respiratory distress.     Comments: Speaking in full sentences Neurological:     Mental Status: She is alert.  Psychiatric:        Mood and Affect: Mood is anxious and depressed. Affect is tearful.        Speech: Speech is tangential.        Behavior: Behavior normal.        Thought Content: Thought content normal.        Judgment: Judgment normal.     Results for orders placed or performed in visit on 09/12/18  Microscopic Examination  Result Value Ref Range   WBC,  UA None seen 0 - 5 /hpf   RBC, UA 0-2 0 - 2 /hpf   Epithelial Cells (non renal) None seen 0 - 10 /hpf   Bacteria, UA None seen None seen/Few  Comprehensive metabolic panel  Result Value Ref Range   Glucose 110 (H) 65 - 99 mg/dL   BUN 14 8 - 27 mg/dL   Creatinine, Ser 4.091.18 (H) 0.57 - 1.00 mg/dL   GFR calc non Af Amer 46 (L) >59 mL/min/1.73   GFR calc Af Amer 53 (L) >59 mL/min/1.73   BUN/Creatinine Ratio 12 12 - 28   Sodium 142 134 - 144 mmol/L   Potassium 5.0 3.5 - 5.2 mmol/L   Chloride 104 96 - 106 mmol/L   CO2 23 20 - 29 mmol/L   Calcium 10.0 8.7 - 10.3 mg/dL   Total Protein 7.3 6.0 - 8.5 g/dL   Albumin 4.5 3.5 - 4.8 g/dL   Globulin, Total 2.8 1.5 - 4.5 g/dL   Albumin/Globulin Ratio 1.6 1.2 - 2.2   Bilirubin Total 0.4 0.0 - 1.2 mg/dL   Alkaline Phosphatase 69 39 - 117 IU/L   AST 31 0 - 40 IU/L   ALT 20 0 - 32 IU/L  CBC with Differential/Platelet  Result Value Ref Range   WBC 11.1 (H) 3.4 - 10.8 x10E3/uL   RBC 4.83 3.77 - 5.28 x10E6/uL   Hemoglobin 15.1 11.1 - 15.9 g/dL   Hematocrit 81.144.6 91.434.0 - 46.6 %   MCV 92 79 - 97 fL   MCH 31.3 26.6 - 33.0 pg   MCHC 33.9 31.5 - 35.7 g/dL   RDW 78.212.2 95.611.7 - 21.315.4 %   Platelets 253 150 - 450 x10E3/uL   Neutrophils 77 Not Estab. %   Lymphs 18 Not Estab. %   Monocytes 3 Not Estab. %   Eos 0 Not Estab. %   Basos 1 Not Estab. %   Neutrophils Absolute 8.6 (H) 1.4 - 7.0 x10E3/uL   Lymphocytes Absolute 2.0 0.7 - 3.1 x10E3/uL   Monocytes Absolute 0.3 0.1 - 0.9 x10E3/uL   EOS (ABSOLUTE) 0.0 0.0 - 0.4 x10E3/uL   Basophils Absolute 0.1 0.0 - 0.2 x10E3/uL   Immature Granulocytes 1 Not Estab. %   Immature Grans (Abs) 0.1 0.0 - 0.1 x10E3/uL  B12 and Folate Panel  Result Value Ref Range   Vitamin B-12 327 232 - 1,245 pg/mL   Folate 10.1 >3.0 ng/mL  UA/M w/rflx Culture, Routine  Result Value Ref Range   Specific Gravity, UA 1.015 1.005 - 1.030   pH, UA 5.5 5.0 - 7.5   Color, UA Yellow Yellow   Appearance Ur Clear Clear   Leukocytes, UA  Negative Negative   Protein, UA Negative Negative/Trace   Glucose, UA  Negative Negative   Ketones, UA Negative Negative   RBC, UA 1+ (A) Negative   Bilirubin, UA Negative Negative   Urobilinogen, Ur 0.2 0.2 - 1.0 mg/dL   Nitrite, UA Negative Negative   Microscopic Examination See below:   VITAMIN D 25 Hydroxy (Vit-D Deficiency, Fractures)  Result Value Ref Range   Vit D, 25-Hydroxy 22.1 (L) 30.0 - 100.0 ng/mL  Vitamin B1  Result Value Ref Range   Thiamine 147.8 66.5 - 200.0 nmol/L  Thyroid Panel With TSH  Result Value Ref Range   TSH 2.580 0.450 - 4.500 uIU/mL   T4, Total 9.0 4.5 - 12.0 ug/dL   T3 Uptake Ratio 27 24 - 39 %   Free Thyroxine Index 2.4 1.2 - 4.9      Assessment & Plan:   Problem List Items Addressed This Visit      Other   Depression, major, single episode, severe (HCC) - Primary    In exacerbation. Willing to increase her prozac to 3x a week. Will increase and recheck in 1 month. Call with any concerns.       Relevant Medications   FLUoxetine (PROZAC) 40 MG capsule   Cough    Will use her nebulizer 3x a day. Call if not getting better or getting worse. Continue to monitor.       Other Visit Diagnoses    Non-intractable vomiting without nausea, unspecified vomiting type       Continue to monitor. BRAT diet. Recheck in 4 weeks. Call if not getting better or getting worse.        Follow up plan: Return in about 4 weeks (around 01/21/2019) for follow up mood.   . This visit was completed via telephone due to the restrictions of the COVID-19 pandemic. All issues as above were discussed and addressed but no physical exam was performed. If it was felt that the patient should be evaluated in the office, they were directed there. The patient verbally consented to this visit. Patient was unable to complete an audio/visual visit due to Lack of equipment. Due to the catastrophic nature of the COVID-19 pandemic, this visit was done through audio contact only. .  Location of the patient: home . Location of the provider: work . Those involved with this call:  . Provider: Olevia Perches, DO . CMA: Tiffany Reel, CMA . Front Desk/Registration: Adela Ports  . Time spent on call: 25 minutes on the phone discussing health concerns. 40 minutes total spent in review of patient's record and preparation of their chart.

## 2018-12-25 ENCOUNTER — Telehealth: Payer: Self-pay | Admitting: Family Medicine

## 2018-12-25 NOTE — Telephone Encounter (Signed)
° ° °  Called Pt regarding Community Resource Referral for transportation: see detailed notes in Referral. LMTCB [phone].   Manuela Schwartz  Care Guide - [facility]   Connected Care  ??[email or web address]  ?? [phone]   Skype

## 2018-12-27 ENCOUNTER — Ambulatory Visit: Payer: Medicare Other | Admitting: Family Medicine

## 2019-01-24 ENCOUNTER — Ambulatory Visit (INDEPENDENT_AMBULATORY_CARE_PROVIDER_SITE_OTHER): Payer: Medicare Other | Admitting: Family Medicine

## 2019-01-24 ENCOUNTER — Other Ambulatory Visit: Payer: Self-pay

## 2019-01-24 ENCOUNTER — Encounter: Payer: Self-pay | Admitting: Family Medicine

## 2019-01-24 VITALS — BP 141/87 | Wt 168.0 lb

## 2019-01-24 DIAGNOSIS — F322 Major depressive disorder, single episode, severe without psychotic features: Secondary | ICD-10-CM | POA: Diagnosis not present

## 2019-01-24 DIAGNOSIS — R112 Nausea with vomiting, unspecified: Secondary | ICD-10-CM

## 2019-01-24 MED ORDER — TRAZODONE HCL 50 MG PO TABS: 25.0000 mg | ORAL_TABLET | Freq: Every evening | ORAL | 3 refills | Status: DC | PRN

## 2019-01-24 MED ORDER — OMEPRAZOLE 20 MG PO CPDR: 20.0000 mg | DELAYED_RELEASE_CAPSULE | Freq: Every day | ORAL | 3 refills | Status: DC

## 2019-01-24 NOTE — Assessment & Plan Note (Signed)
Under good control on current regimen. Continue current regimen. Continue to monitor. Call with any concerns. Refills given. Will start her on trazodone for her sleep.

## 2019-01-24 NOTE — Progress Notes (Signed)
BP (!) 141/87   Wt 168 lb (76.2 kg)   LMP  (LMP Unknown)   BMI 28.84 kg/m    Subjective:    Patient ID: Renee Bridges, female    DOB: 02/10/1946, 73 y.o.   MRN: [id number]  HPI: Renee MannDella S Berkovich is a 73 y.o. female  Chief Complaint  Patient presents with  . Depression   DEPRESSION Mood status: better Satisfied with current treatment?: yes Symptom severity: mild  Duration of current treatment : months Side effects: no Medication compliance: good compliance Psychotherapy/counseling: no  Previous psychiatric medications: prozac Depressed mood: yes Anxious mood: no Anhedonia: no Significant weight loss or gain: no Insomnia: yes  Fatigue: yes Feelings of worthlessness or guilt: no Impaired concentration/indecisiveness: no Suicidal ideations: no Hopelessness: no Crying spells: no [facility] screen [facility] 01/24/2019 12/24/2018 09/30/2018 09/13/2018 03/29/2018  Decreased Interest 0 0 0 3 3  Down, Depressed, Hopeless 0 1 0 3 3  PHQ - 2 Score 0 1 0 6 6  Altered sleeping 2 3 3 2 3   Tired, decreased energy 1 3 0 3 3  Change in appetite 0 3 3 3 3   Feeling bad or failure about yourself  0 0 0 0 3  Trouble concentrating 0 1 0 0 3  Moving slowly or fidgety/restless 0 3 1 0 3  Suicidal thoughts 0 0 0 0 3  PHQ-9 Score 3 14 7 14 27   Difficult doing work/chores Not difficult at all Somewhat difficult Somewhat difficult Very difficult -  Some recent data might be hidden   Relevant past medical, surgical, family and social history reviewed and updated as indicated. Interim medical history since our last visit reviewed. Allergies and medications reviewed and updated.  Review of Systems  Constitutional: Negative.   Respiratory: Negative.   Cardiovascular: Negative.   Gastrointestinal: Positive for vomiting. Negative for abdominal distention, abdominal pain, anal bleeding, blood in stool, constipation, diarrhea, nausea and rectal pain.  Psychiatric/Behavioral: Negative.     Per HPI  unless specifically indicated above     Objective:    BP (!) 141/87   Wt 168 lb (76.2 kg)   LMP  (LMP Unknown)   BMI 28.84 kg/m   Wt Readings from Last 3 Encounters:  01/24/19 168 lb (76.2 kg)  12/24/18 165 lb (74.8 kg)  09/30/18 172 lb 1.6 oz (78.1 kg)    Physical Exam Vitals signs and nursing note reviewed.  Constitutional:      General: She is not in acute distress.    Appearance: Normal appearance. She is not ill-appearing, toxic-appearing or diaphoretic.  HENT:     Head: Normocephalic and atraumatic.     Right Ear: External ear normal.     Left Ear: External ear normal.     Nose: Nose normal.     Mouth/Throat:     Mouth: Mucous membranes are moist.     Pharynx: Oropharynx is clear.  Eyes:     General: No scleral icterus.       Right eye: No discharge.        Left eye: No discharge.     Conjunctiva/sclera: Conjunctivae normal.     Pupils: Pupils are equal, round, and reactive to light.  Neck:     Musculoskeletal: Normal range of motion.  Pulmonary:     Effort: Pulmonary effort is normal. No respiratory distress.     Comments: Speaking in full sentences Musculoskeletal: Normal range of motion.  Skin:    Coloration: Skin is not  jaundiced or pale.     Findings: No bruising, erythema, lesion or rash.  Neurological:     Mental Status: She is alert and oriented to person, place, and time. Mental status is at baseline.  Psychiatric:        Mood and Affect: Mood normal.        Behavior: Behavior normal.        Thought Content: Thought content normal.        Judgment: Judgment normal.     Results for orders placed or performed in visit on 09/12/18  Microscopic Examination  Result Value Ref Range   WBC, UA None seen 0 - 5 /hpf   RBC, UA 0-2 0 - 2 /hpf   Epithelial Cells (non renal) None seen 0 - 10 /hpf   Bacteria, UA None seen None seen/Few  Comprehensive metabolic panel  Result Value Ref Range   Glucose 110 (H) 65 - 99 mg/dL   BUN 14 8 - 27 mg/dL    Creatinine, Ser 9.60 (H) 0.57 - 1.00 mg/dL   GFR calc non Af Amer 46 (L) >59 mL/min/1.73   GFR calc Af Amer 53 (L) >59 mL/min/1.73   BUN/Creatinine Ratio 12 12 - 28   Sodium 142 134 - 144 mmol/L   Potassium 5.0 3.5 - 5.2 mmol/L   Chloride 104 96 - 106 mmol/L   CO2 23 20 - 29 mmol/L   Calcium 10.0 8.7 - 10.3 mg/dL   Total Protein 7.3 6.0 - 8.5 g/dL   Albumin 4.5 3.5 - 4.8 g/dL   Globulin, Total 2.8 1.5 - 4.5 g/dL   Albumin/Globulin Ratio 1.6 1.2 - 2.2   Bilirubin Total 0.4 0.0 - 1.2 mg/dL   Alkaline Phosphatase 69 39 - 117 IU/L   AST 31 0 - 40 IU/L   ALT 20 0 - 32 IU/L  CBC with Differential/Platelet  Result Value Ref Range   WBC 11.1 (H) 3.4 - 10.8 x10E3/uL   RBC 4.83 3.77 - 5.28 x10E6/uL   Hemoglobin 15.1 11.1 - 15.9 g/dL   Hematocrit 45.4 09.8 - 46.6 %   MCV 92 79 - 97 fL   MCH 31.3 26.6 - 33.0 pg   MCHC 33.9 31.5 - 35.7 g/dL   RDW 11.9 14.7 - 82.9 %   Platelets 253 150 - 450 x10E3/uL   Neutrophils 77 Not Estab. %   Lymphs 18 Not Estab. %   Monocytes 3 Not Estab. %   Eos 0 Not Estab. %   Basos 1 Not Estab. %   Neutrophils Absolute 8.6 (H) 1.4 - 7.0 x10E3/uL   Lymphocytes Absolute 2.0 0.7 - 3.1 x10E3/uL   Monocytes Absolute 0.3 0.1 - 0.9 x10E3/uL   EOS (ABSOLUTE) 0.0 0.0 - 0.4 x10E3/uL   Basophils Absolute 0.1 0.0 - 0.2 x10E3/uL   Immature Granulocytes 1 Not Estab. %   Immature Grans (Abs) 0.1 0.0 - 0.1 x10E3/uL  B12 and Folate Panel  Result Value Ref Range   Vitamin B-12 327 232 - 1,245 pg/mL   Folate 10.1 >3.0 ng/mL  UA/M w/rflx Culture, Routine  Result Value Ref Range   Specific Gravity, UA 1.015 1.005 - 1.030   pH, UA 5.5 5.0 - 7.5   Color, UA Yellow Yellow   Appearance Ur Clear Clear   Leukocytes, UA Negative Negative   Protein, UA Negative Negative/Trace   Glucose, UA Negative Negative   Ketones, UA Negative Negative   RBC, UA 1+ (A) Negative   Bilirubin, UA Negative Negative  Urobilinogen, Ur 0.2 0.2 - 1.0 mg/dL   Nitrite, UA Negative Negative    Microscopic Examination See below:   VITAMIN D 25 Hydroxy (Vit-D Deficiency, Fractures)  Result Value Ref Range   Vit D, 25-Hydroxy 22.1 (L) 30.0 - 100.0 ng/mL  Vitamin B1  Result Value Ref Range   Thiamine 147.8 66.5 - 200.0 nmol/L  Thyroid Panel With TSH  Result Value Ref Range   TSH 2.580 0.450 - 4.500 uIU/mL   T4, Total 9.0 4.5 - 12.0 ug/dL   T3 Uptake Ratio 27 24 - 39 %   Free Thyroxine Index 2.4 1.2 - 4.9      Assessment & Plan:   Problem List Items Addressed This Visit      Other   Depression, major, single episode, severe (HCC) - Primary    Under good control on current regimen. Continue current regimen. Continue to monitor. Call with any concerns. Refills given. Will start her on trazodone for her sleep.       Relevant Medications   traZODone (DESYREL) 50 MG tablet    Other Visit Diagnoses    Non-intractable vomiting with nausea, unspecified vomiting type       Will start her on omeprazole and recheck at follow up. Call with any concerns.        Follow up plan: Return in about 4 weeks (around 02/21/2019) for follow up.    . This visit was completed via FaceTime due to the restrictions of the COVID-19 pandemic. All issues as above were discussed and addressed. Physical exam was done as above through visual confirmation on FaceTime. If it was felt that the patient should be evaluated in the office, they were directed there. The patient verbally consented to this visit. . Location of the patient: home . Location of the provider: home . Those involved with this call:  . Provider: Olevia Perches, DO . CMA: Wilhemena Durie, CMA . Front Desk/Registration: Adela Ports  . Time spent on call: 15 minutes with patient face to face via video conference. More than 50% of this time was spent in counseling and coordination of care. 23 minutes total spent in review of patient's record and preparation of their chart.

## 2019-01-31 ENCOUNTER — Telehealth: Payer: Self-pay | Admitting: Family Medicine

## 2019-01-31 NOTE — Telephone Encounter (Signed)
Copied from CRM [id number]. Topic: General - Inquiry >> Jan 31, 2019  4:40 PM Deborha Payment wrote: Reason for CRM: Patient is having issues sleeping staying up till one or two oclock. Patient believes the fluoxetine is affecting her stomach. Patient is having issues with these medications. Can PCP or nurse please advise. traZODone (DESYREL) 50 MG tablet FLUoxetine (PROZAC) 40 MG  omeprazole (PRILOSEC) 20 MG   [phone]

## 2019-01-31 NOTE — Telephone Encounter (Signed)
She can take 2 of her trazodone at bed time. She is taking the fluoxetine 3x a week, so if she's feeling bad with her stomach, it's probably not from that. If she wants to talk about it, we can schedule her an appointment to find out what's going on with her belly. Thanks.

## 2019-01-31 NOTE — Telephone Encounter (Signed)
Patient's daughter notified.

## 2019-02-28 ENCOUNTER — Ambulatory Visit (INDEPENDENT_AMBULATORY_CARE_PROVIDER_SITE_OTHER): Payer: Medicare Other | Admitting: Family Medicine

## 2019-02-28 ENCOUNTER — Encounter: Payer: Self-pay | Admitting: Family Medicine

## 2019-02-28 ENCOUNTER — Other Ambulatory Visit: Payer: Self-pay

## 2019-02-28 ENCOUNTER — Telehealth: Payer: Self-pay | Admitting: Family Medicine

## 2019-02-28 VITALS — BP 138/89 | HR 71 | Temp 97.8°F | Ht 63.0 in | Wt 171.0 lb

## 2019-02-28 DIAGNOSIS — R1084 Generalized abdominal pain: Secondary | ICD-10-CM | POA: Diagnosis not present

## 2019-02-28 DIAGNOSIS — F5101 Primary insomnia: Secondary | ICD-10-CM | POA: Diagnosis not present

## 2019-02-28 DIAGNOSIS — F322 Major depressive disorder, single episode, severe without psychotic features: Secondary | ICD-10-CM | POA: Diagnosis not present

## 2019-02-28 MED ORDER — TRAZODONE HCL 50 MG PO TABS: 50.0000 mg | ORAL_TABLET | Freq: Every evening | ORAL | 6 refills | Status: DC | PRN

## 2019-02-28 NOTE — Assessment & Plan Note (Signed)
Under good control on current regimen. Continue current regimen. Continue to monitor. Call with any concerns. Refills given. Recheck 3 months.   

## 2019-02-28 NOTE — Progress Notes (Signed)
BP 138/89   Pulse 71   Temp 97.8 F (36.6 C) (Oral)   Ht 5\' 3"  (1.6 m)   Wt 171 lb (77.6 kg)   LMP  (LMP Unknown)   SpO2 96%   BMI 30.29 kg/m    Subjective:    Patient ID: Renee Bridges, female    DOB: 01/03/1946, 73 y.o.   MRN: [id number]  HPI: Renee Bridges is a 73 y.o. female  Chief Complaint  Patient presents with  . Abdominal Pain    month f/u  . Depression   Not vomiting anymore. Feeling well. Tolerating her omeprazole well. No other concerns.   DEPRESSION Mood status: stable Satisfied with current treatment?: yes Symptom severity: mild  Duration of current treatment : chronic Side effects: no Medication compliance: excellent compliance Psychotherapy/counseling: no  Previous psychiatric medications: prozac Depressed mood: yes Anxious mood: no Anhedonia: no Significant weight loss or gain: no Insomnia: no- stable  Fatigue: yes Feelings of worthlessness or guilt: yes Impaired concentration/indecisiveness: no Suicidal ideations: no Hopelessness: no Crying spells: no Depression screen [facility] 2[facility] 02/28/2019 01/24/2019 12/24/2018 09/30/2018 09/13/2018  Decreased Interest 0 0 0 0 3  Down, Depressed, Hopeless 1 0 1 0 3  PHQ - 2 Score 1 0 1 0 6  Altered sleeping 0 2 3 3 2   Tired, decreased energy 0 1 3 0 3  Change in appetite 1 0 3 3 3   Feeling bad or failure about yourself  0 0 0 0 0  Trouble concentrating 0 0 1 0 0  Moving slowly or fidgety/restless 1 0 3 1 0  Suicidal thoughts 0 0 0 0 0  PHQ-9 Score 3 3 14 7 14   Difficult doing work/chores Somewhat difficult Not difficult at all Somewhat difficult Somewhat difficult Very difficult  Some recent data might be hidden   GAD 7 : Generalized Anxiety Score 02/28/2019  Nervous, Anxious, on Edge 1  Control/stop worrying 2  Worry too much - different things 2  Trouble relaxing 1  Restless 2  Easily annoyed or irritable 1  Afraid - awful might happen 2  Total GAD 7 Score 11  Anxiety Difficulty Not difficult at all      Relevant past medical, surgical, family and social history reviewed and updated as indicated. Interim medical history since our last visit reviewed. Allergies and medications reviewed and updated.  Review of Systems  Constitutional: Negative.   Respiratory: Negative.   Cardiovascular: Negative.   Gastrointestinal: Negative.   Musculoskeletal: Negative.   Neurological: Negative.   Psychiatric/Behavioral: Negative.     Per HPI unless specifically indicated above     Objective:    BP 138/89   Pulse 71   Temp 97.8 F (36.6 C) (Oral)   Ht 5\' 3"  (1.6 m)   Wt 171 lb (77.6 kg)   LMP  (LMP Unknown)   SpO2 96%   BMI 30.29 kg/m   Wt Readings from Last 3 Encounters:  02/28/19 171 lb (77.6 kg)  01/24/19 168 lb (76.2 kg)  12/24/18 165 lb (74.8 kg)    Physical Exam Vitals signs and nursing note reviewed.  Constitutional:      General: She is not in acute distress.    Appearance: Normal appearance. She is not ill-appearing, toxic-appearing or diaphoretic.  HENT:     Head: Normocephalic and atraumatic.     Right Ear: External ear normal.     Left Ear: External ear normal.     Nose: Nose normal.  Mouth/Throat:     Mouth: Mucous membranes are moist.     Pharynx: Oropharynx is clear.  Eyes:     General: No scleral icterus.       Right eye: No discharge.        Left eye: No discharge.     Extraocular Movements: Extraocular movements intact.     Conjunctiva/sclera: Conjunctivae normal.     Pupils: Pupils are equal, round, and reactive to light.  Neck:     Musculoskeletal: Normal range of motion and neck supple.  Cardiovascular:     Rate and Rhythm: Normal rate and regular rhythm.     Pulses: Normal pulses.     Heart sounds: Normal heart sounds. No murmur. No friction rub. No gallop.   Pulmonary:     Effort: Pulmonary effort is normal. No respiratory distress.     Breath sounds: Normal breath sounds. No stridor. No wheezing, rhonchi or rales.  Chest:     Chest wall:  No tenderness.  Musculoskeletal: Normal range of motion.  Skin:    General: Skin is warm and dry.     Capillary Refill: Capillary refill takes less than 2 seconds.     Coloration: Skin is not jaundiced or pale.     Findings: No bruising, erythema, lesion or rash.  Neurological:     General: No focal deficit present.     Mental Status: She is alert and oriented to person, place, and time. Mental status is at baseline.  Psychiatric:        Mood and Affect: Mood normal.        Behavior: Behavior normal.        Thought Content: Thought content normal.        Judgment: Judgment normal.     Results for orders placed or performed in visit on 09/12/18  Microscopic Examination   URINE  Result Value Ref Range   WBC, UA None seen 0 - 5 /hpf   RBC, UA 0-2 0 - 2 /hpf   Epithelial Cells (non renal) None seen 0 - 10 /hpf   Bacteria, UA None seen None seen/Few  Comprehensive metabolic panel  Result Value Ref Range   Glucose 110 (H) 65 - 99 mg/dL   BUN 14 8 - 27 mg/dL   Creatinine, Ser 1.611.18 (H) 0.57 - 1.00 mg/dL   GFR calc non Af Amer 46 (L) >59 mL/min/1.73   GFR calc Af Amer 53 (L) >59 mL/min/1.73   BUN/Creatinine Ratio 12 12 - 28   Sodium 142 134 - 144 mmol/L   Potassium 5.0 3.5 - 5.2 mmol/L   Chloride 104 96 - 106 mmol/L   CO2 23 20 - 29 mmol/L   Calcium 10.0 8.7 - 10.3 mg/dL   Total Protein 7.3 6.0 - 8.5 g/dL   Albumin 4.5 3.5 - 4.8 g/dL   Globulin, Total 2.8 1.5 - 4.5 g/dL   Albumin/Globulin Ratio 1.6 1.2 - 2.2   Bilirubin Total 0.4 0.0 - 1.2 mg/dL   Alkaline Phosphatase 69 39 - 117 IU/L   AST 31 0 - 40 IU/L   ALT 20 0 - 32 IU/L  CBC with Differential/Platelet  Result Value Ref Range   WBC 11.1 (H) 3.4 - 10.8 x10E3/uL   RBC 4.83 3.77 - 5.28 x10E6/uL   Hemoglobin 15.1 11.1 - 15.9 g/dL   Hematocrit 09.644.6 04.534.0 - 46.6 %   MCV 92 79 - 97 fL   MCH 31.3 26.6 - 33.0 pg   MCHC 33.9  31.5 - 35.7 g/dL   RDW 12.2 11.7 - 15.4 %   Platelets 253 150 - 450 x10E3/uL   Neutrophils 77 Not  Estab. %   Lymphs 18 Not Estab. %   Monocytes 3 Not Estab. %   Eos 0 Not Estab. %   Basos 1 Not Estab. %   Neutrophils Absolute 8.6 (H) 1.4 - 7.0 x10E3/uL   Lymphocytes Absolute 2.0 0.7 - 3.1 x10E3/uL   Monocytes Absolute 0.3 0.1 - 0.9 x10E3/uL   EOS (ABSOLUTE) 0.0 0.0 - 0.4 x10E3/uL   Basophils Absolute 0.1 0.0 - 0.2 x10E3/uL   Immature Granulocytes 1 Not Estab. %   Immature Grans (Abs) 0.1 0.0 - 0.1 x10E3/uL  B12 and Folate Panel  Result Value Ref Range   Vitamin B-12 327 232 - 1,245 pg/mL   Folate 10.1 >3.0 ng/mL  UA/M w/rflx Culture, Routine   Specimen: Urine   URINE  Result Value Ref Range   Specific Gravity, UA 1.015 1.005 - 1.030   pH, UA 5.5 5.0 - 7.5   Color, UA Yellow Yellow   Appearance Ur Clear Clear   Leukocytes, UA Negative Negative   Protein, UA Negative Negative/Trace   Glucose, UA Negative Negative   Ketones, UA Negative Negative   RBC, UA 1+ (A) Negative   Bilirubin, UA Negative Negative   Urobilinogen, Ur 0.2 0.2 - 1.0 mg/dL   Nitrite, UA Negative Negative   Microscopic Examination See below:   VITAMIN D 25 Hydroxy (Vit-D Deficiency, Fractures)  Result Value Ref Range   Vit D, 25-Hydroxy 22.1 (L) 30.0 - 100.0 ng/mL  Vitamin B1  Result Value Ref Range   Thiamine 147.8 66.5 - 200.0 nmol/L  Thyroid Panel With TSH  Result Value Ref Range   TSH 2.580 0.450 - 4.500 uIU/mL   T4, Total 9.0 4.5 - 12.0 ug/dL   T3 Uptake Ratio 27 24 - 39 %   Free Thyroxine Index 2.4 1.2 - 4.9      Assessment & Plan:   Problem List Items Addressed This Visit      Other   Depression, major, single episode, severe ([place]) - Primary    Under good control on current regimen. Continue current regimen. Continue to monitor. Call with any concerns. Refills given. Recheck 3 months.        Relevant Medications   traZODone (DESYREL) 50 MG tablet    Other Visit Diagnoses    Generalized abdominal pain       Resolved. Feeling better. Continue to monitor. Continue omeprazole    Relevant Orders   Comprehensive metabolic panel   CBC with Differential/Platelet   TSH   Primary insomnia       Doing well on the trazodone. Continue current regimen. Continue to monitor.        Follow up plan: Return in about 3 months (around 05/31/2019).

## 2019-02-28 NOTE — Telephone Encounter (Signed)
Granddaughter requests call back from social worker to see if there is any help we can get about closer housing. Thanks!

## 2019-03-07 ENCOUNTER — Telehealth: Payer: Medicaid Other

## 2019-03-11 ENCOUNTER — Telehealth: Payer: Self-pay | Admitting: Family Medicine

## 2019-03-11 NOTE — Telephone Encounter (Signed)
I have not gotten any forms- please confirm with patient that she requested something and find out what the supplies are. Thanks!

## 2019-03-11 NOTE — Telephone Encounter (Signed)
Copied from [place] [phone]. Topic: General - Other >> Mar 11, 2019  2:27 PM Leward Quan A wrote: Reason for CRM: [employer] called to request the completion and return of a CMN and an MEO form that was faxed to the office on 02/24/2019 and 02/28/2019 asking for these to be returned please so patient can receive her supplies without any more delay

## 2019-03-11 NOTE — Telephone Encounter (Signed)
LVM for patient to return phone call.  

## 2019-03-12 NOTE — Telephone Encounter (Signed)
Perfect! Thanks! I'll fill out the form as soon as we get it

## 2019-03-12 NOTE — Telephone Encounter (Signed)
Notified by front patient called back.  Returned call, no answer. LVM

## 2019-03-12 NOTE — Telephone Encounter (Signed)
Spoke with patient.  Patient state she just received her supplies (diapers). They notified her they needed a new certification to continue.  I explained that our office has not received anything from them. Patient provided me with number to call so they can refax the certification. The number is [phone].   Routing to Dr. Wynetta Emery as Juluis Rainier, please route back.

## 2019-03-12 NOTE — Telephone Encounter (Signed)
Called and spoke with [employer].  The had the right fax number and is resending it again.  I told to please put attention Eulon Allnutt/Dr. Wynetta Emery.

## 2019-03-13 NOTE — Telephone Encounter (Signed)
Form filled out today. 

## 2019-03-25 ENCOUNTER — Ambulatory Visit: Payer: Self-pay | Admitting: Licensed Clinical Social Worker

## 2019-03-25 NOTE — Chronic Care Management (AMB) (Signed)
  Chronic Care Management    Clinical Social Work Follow Up Note  03/25/2019 Name: Renee Bridges MRN: [id number] DOB: [birth year]  Renee Bridges is a 73 y.o. year old female who is a primary care patient of Valerie Roys, DO. The CCM team was consulted for assistance with Transportation Resources.   Review of patient status, including review of consultants reports, other relevant assessments, and collaboration with appropriate care team members and the patient's provider was performed as part of comprehensive patient evaluation and provision of chronic care management services.     LCSW completed CCM outreach attempt today but was unable to reach patient successfully. A HIPPA compliant voice message was left encouraging patient to return call once available if she was still having issues with transportation. [employer] contact number left on voice message as well. LCSW rescheduled CCM SW appointment as well.  Follow Up Plan: Client will contact CCM social worker if she needs transportation assistnace/education  Eula Fried, [place], MSW, [email or web address] Phone: [phone]

## 2019-04-04 ENCOUNTER — Telehealth: Payer: Self-pay

## 2019-04-04 ENCOUNTER — Ambulatory Visit: Payer: Self-pay | Admitting: Licensed Clinical Social Worker

## 2019-04-04 NOTE — Chronic Care Management (AMB) (Signed)
  Chronic Care Management    Clinical Social Work Follow Up Note  04/04/2019 Name: Renee Bridges MRN: [id number] DOB: Jul 03, 1946  Renee Bridges is a 73 y.o. year old female who is a primary care patient of Valerie Roys, DO. The CCM team was consulted for assistance with Financial Difficulties related to housing.   Review of patient status, including review of consultants reports, other relevant assessments, and collaboration with appropriate care team members and the patient's provider was performed as part of comprehensive patient evaluation and provision of chronic care management services.    LCSW completed CCM outreach attempt today but was unable to reach patient successfully. A HIPPA compliant voice message was left encouraging patient to return call once available. LCSW rescheduled CCM SW appointment as well.  Follow Up Plan: SW will follow up with patient by phone over the next 60 days  Eula Fried, [place], MSW, [email or web address] Phone: [phone]

## 2019-05-05 DIAGNOSIS — M25551 Pain in right hip: Secondary | ICD-10-CM | POA: Insufficient documentation

## 2019-05-09 ENCOUNTER — Ambulatory Visit: Payer: Self-pay | Admitting: Licensed Clinical Social Worker

## 2019-05-09 ENCOUNTER — Telehealth: Payer: Self-pay

## 2019-05-09 NOTE — Chronic Care Management (AMB) (Signed)
  Chronic Care Management    Clinical Social Work Follow Up Note  05/09/2019 Name: Renee Bridges MRN: [id number] DOB: 02-Aug-1946  Renee Bridges is a 73 y.o. year old female who is a primary care patient of Valerie Roys, DO. The CCM team was consulted for assistance with [name] .   Review of patient status, including review of consultants reports, other relevant assessments, and collaboration with appropriate care team members and the patient's provider was performed as part of comprehensive patient evaluation and provision of chronic care management services.    Outpatient Encounter Medications as of 05/09/2019  Medication Sig  . albuterol (PROVENTIL) (2.5 MG/3ML) 0.083% nebulizer solution Take 3 mLs (2.5 mg total) by nebulization every 6 (six) hours as needed for wheezing or shortness of breath.  . fexofenadine (ALLEGRA) 180 MG tablet Take 180 mg by mouth daily as needed (for sneezing, watery eyes, itchy nose/throat.).  Marland Kitchen FLUoxetine (PROZAC) 40 MG capsule Take 2 pills 3x a week  . omeprazole (PRILOSEC) 20 MG capsule Take 1 capsule (20 mg total) by mouth daily.  . traZODone (DESYREL) 50 MG tablet Take 1-2 tablets (50-100 mg total) by mouth at bedtime as needed for sleep.   No facility-administered encounter medications on file as of 05/09/2019.    LCSW completed CCM outreach attempt today but was unable to reach patient successfully. A HIPPA compliant voice message was left encouraging patient to return call once available. LCSW rescheduled CCM SW appointment as well.  Follow Up Plan: SW will follow up with patient by phone over the next 45 days  Eula Fried, [place], MSW, [email or web address] Phone: [phone]

## 2019-05-14 ENCOUNTER — Telehealth: Payer: Self-pay

## 2019-05-14 NOTE — Telephone Encounter (Signed)
Copied from [place] [phone]. Topic: General - Other >> May 14, 2019  3:54 PM Leward Quan A wrote: Reason for CRM: Patient called and asked if [facility] can prescribe her some Tizanidine states that she need it because at night her legs give her a hard time. Per patient she took them a few years ago and is in need of them again. Please advise   Ph# [phone]   Routing to provider.

## 2019-05-16 NOTE — Telephone Encounter (Signed)
Routing to provider  

## 2019-05-16 NOTE — Telephone Encounter (Signed)
[facility], [place]  [place] [address]  Phone: [phone] Fax: [phone]   Pt called to check status, states that muscle spasms are very bad in her calves and feet. Please advise

## 2019-05-19 NOTE — Telephone Encounter (Signed)
Needs appointment. Virtual OK 

## 2019-05-19 NOTE — Telephone Encounter (Signed)
Appt made

## 2019-05-19 NOTE — Telephone Encounter (Signed)
Pt called again about the spasms she is having and asked about the status of her request for medication / please call Pts home number and advise

## 2019-05-22 ENCOUNTER — Encounter: Payer: Self-pay | Admitting: Family Medicine

## 2019-05-22 ENCOUNTER — Other Ambulatory Visit: Payer: Self-pay

## 2019-05-22 ENCOUNTER — Ambulatory Visit (INDEPENDENT_AMBULATORY_CARE_PROVIDER_SITE_OTHER): Payer: Medicare Other | Admitting: Family Medicine

## 2019-05-22 VITALS — BP 135/79 | HR 66 | Temp 97.8°F | Ht 63.0 in | Wt 178.0 lb

## 2019-05-22 DIAGNOSIS — M62838 Other muscle spasm: Secondary | ICD-10-CM

## 2019-05-22 DIAGNOSIS — Z23 Encounter for immunization: Secondary | ICD-10-CM

## 2019-05-22 DIAGNOSIS — F322 Major depressive disorder, single episode, severe without psychotic features: Secondary | ICD-10-CM | POA: Diagnosis not present

## 2019-05-22 MED ORDER — FLUOXETINE HCL 40 MG PO CAPS: ORAL_CAPSULE | ORAL | 1 refills | Status: DC

## 2019-05-22 NOTE — Progress Notes (Signed)
BP 135/79   Pulse 66   Temp 97.8 F (36.6 C) (Oral)   Ht 5\' 3"  (1.6 m)   Wt 178 lb (80.7 kg)   LMP  (LMP Unknown)   SpO2 95%   BMI 31.53 kg/m    Subjective:    Patient ID: Renee Bridges, female    DOB: 02-15-1946, 73 y.o.   MRN: [id number]  HPI: Renee Bridges is a 73 y.o. female  Chief Complaint  Patient presents with  . Spasms    on back, legs and hands. ongoing 3-4 weeks   LEG CRAMPS Duration: couple of months Pain: yes Severity: moderate  Quality:  cramping Location:  lower legs Bilateral:  yes Onset: gradual Frequency: at night  Time of  day:   night time Sudden unintentional leg jerking:   yes Paresthesias:   yes Decreased sensation:  yes Weakness:   yes Insomnia:   yes Fatigue:   yes Status: worse  DEPRESSION- stopped taking her prozac, not sure when. Her dog got sick, so she put an ointment for her dog in her medicine bottle, and put her medicine in a bag, then forgot about it Mood status: worse Satisfied with current treatment?: no Symptom severity: moderate  Duration of current treatment : months Side effects: no Medication compliance: poor compliance Psychotherapy/counseling: no  Previous psychiatric medications: prozac Depressed mood: yes Anxious mood: yes Anhedonia: no Significant weight loss or gain: no Insomnia: yes hard to fall asleep Fatigue: yes Feelings of worthlessness or guilt: yes Impaired concentration/indecisiveness: yes Suicidal ideations: no Hopelessness: no Crying spells: no Depression screen [facility] 2/9 05/22/2019 02/28/2019 01/24/2019 12/24/2018 09/30/2018  Decreased Interest 1 0 0 0 0  Down, Depressed, Hopeless 0 1 0 1 0  PHQ - 2 Score 1 1 0 1 0  Altered sleeping 3 0 2 3 3   Tired, decreased energy 1 0 1 3 0  Change in appetite 3 1 0 3 3  Feeling bad or failure about yourself  0 0 0 0 0  Trouble concentrating 0 0 0 1 0  Moving slowly or fidgety/restless 0 1 0 3 1  Suicidal thoughts 0 0 0 0 0  PHQ-9 Score 8 3 3 14 7    Difficult doing work/chores Somewhat difficult Somewhat difficult Not difficult at all Somewhat difficult Somewhat difficult  Some recent data might be hidden    Relevant past medical, surgical, family and social history reviewed and updated as indicated. Interim medical history since our last visit reviewed. Allergies and medications reviewed and updated.  Review of Systems  Constitutional: Negative.   Respiratory: Negative.   Cardiovascular: Negative.   Musculoskeletal: Positive for myalgias. Negative for arthralgias, back pain, gait problem, joint swelling, neck pain and neck stiffness.  Skin: Negative.   Psychiatric/Behavioral: Positive for dysphoric mood and sleep disturbance. Negative for agitation, behavioral problems, confusion, decreased concentration, hallucinations, self-injury and suicidal ideas. The patient is nervous/anxious. The patient is not hyperactive.     Per HPI unless specifically indicated above     Objective:    BP 135/79   Pulse 66   Temp 97.8 F (36.6 C) (Oral)   Ht 5\' 3"  (1.6 m)   Wt 178 lb (80.7 kg)   LMP  (LMP Unknown)   SpO2 95%   BMI 31.53 kg/m   Wt Readings from Last 3 Encounters:  05/22/19 178 lb (80.7 kg)  02/28/19 171 lb (77.6 kg)  01/24/19 168 lb (76.2 kg)    Physical Exam Vitals signs and  nursing note reviewed.  Constitutional:      General: She is not in acute distress.    Appearance: Normal appearance. She is not ill-appearing, toxic-appearing or diaphoretic.  HENT:     Head: Normocephalic and atraumatic.     Right Ear: External ear normal.     Left Ear: External ear normal.     Nose: Nose normal.     Mouth/Throat:     Mouth: Mucous membranes are moist.     Pharynx: Oropharynx is clear.  Eyes:     General: No scleral icterus.       Right eye: No discharge.        Left eye: No discharge.     Extraocular Movements: Extraocular movements intact.     Conjunctiva/sclera: Conjunctivae normal.     Pupils: Pupils are equal, round,  and reactive to light.  Neck:     Musculoskeletal: Normal range of motion and neck supple.  Cardiovascular:     Rate and Rhythm: Normal rate and regular rhythm.     Pulses: Normal pulses.     Heart sounds: Normal heart sounds. No murmur. No friction rub. No gallop.   Pulmonary:     Effort: Pulmonary effort is normal. No respiratory distress.     Breath sounds: Normal breath sounds. No stridor. No wheezing, rhonchi or rales.  Chest:     Chest wall: No tenderness.  Musculoskeletal: Normal range of motion.  Skin:    General: Skin is warm and dry.     Capillary Refill: Capillary refill takes less than 2 seconds.     Coloration: Skin is not jaundiced or pale.     Findings: No bruising, erythema, lesion or rash.  Neurological:     General: No focal deficit present.     Mental Status: She is alert and oriented to person, place, and time. Mental status is at baseline.  Psychiatric:        Mood and Affect: Mood normal.        Behavior: Behavior normal.        Thought Content: Thought content normal.        Judgment: Judgment normal.     Results for orders placed or performed in visit on 05/22/19  Comprehensive metabolic panel  Result Value Ref Range   Glucose 108 (H) 65 - 99 mg/dL   BUN 19 8 - 27 mg/dL   Creatinine, Ser 5.401.09 (H) 0.57 - 1.00 mg/dL   GFR calc non Af Amer 50 (L) >59 mL/min/1.73   GFR calc Af Amer 57 (L) >59 mL/min/1.73   BUN/Creatinine Ratio 17 12 - 28   Sodium 140 134 - 144 mmol/L   Potassium 4.6 3.5 - 5.2 mmol/L   Chloride 102 96 - 106 mmol/L   CO2 25 20 - 29 mmol/L   Calcium 9.4 8.7 - 10.3 mg/dL   Total Protein 6.7 6.0 - 8.5 g/dL   Albumin 4.0 3.7 - 4.7 g/dL   Globulin, Total 2.7 1.5 - 4.5 g/dL   Albumin/Globulin Ratio 1.5 1.2 - 2.2   Bilirubin Total 0.6 0.0 - 1.2 mg/dL   Alkaline Phosphatase 69 39 - 117 IU/L   AST 17 0 - 40 IU/L   ALT 21 0 - 32 IU/L  CBC with Differential/Platelet  Result Value Ref Range   WBC 11.4 (H) 3.4 - 10.8 x10E3/uL   RBC 4.95 3.77  - 5.28 x10E6/uL   Hemoglobin 15.6 11.1 - 15.9 g/dL   Hematocrit 98.145.5 19.134.0 - 46.6 %  MCV 92 79 - 97 fL   MCH 31.5 26.6 - 33.0 pg   MCHC 34.3 31.5 - 35.7 g/dL   RDW 12.0 11.7 - 15.4 %   Platelets 245 150 - 450 x10E3/uL   Neutrophils 64 Not Estab. %   Lymphs 26 Not Estab. %   Monocytes 8 Not Estab. %   Eos 1 Not Estab. %   Basos 1 Not Estab. %   Neutrophils Absolute 7.4 (H) 1.4 - 7.0 x10E3/uL   Lymphocytes Absolute 2.9 0.7 - 3.1 x10E3/uL   Monocytes Absolute 0.9 0.1 - 0.9 x10E3/uL   EOS (ABSOLUTE) 0.1 0.0 - 0.4 x10E3/uL   Basophils Absolute 0.1 0.0 - 0.2 x10E3/uL   Immature Granulocytes 0 Not Estab. %   Immature Grans (Abs) 0.0 0.0 - 0.1 x10E3/uL  Bayer DCA Hb A1c Waived  Result Value Ref Range   HB A1C (BAYER DCA - WAIVED) 5.8 <7.0 %  TSH  Result Value Ref Range   TSH 3.210 0.450 - 4.500 uIU/mL  VITAMIN D 25 Hydroxy (Vit-D Deficiency, Fractures)  Result Value Ref Range   Vit D, 25-Hydroxy 19.6 (L) 30.0 - 100.0 ng/mL  Iron and TIBC  Result Value Ref Range   Total Iron Binding Capacity 332 250 - 450 ug/dL   UIBC 154 118 - 369 ug/dL   Iron 178 (H) 27 - 139 ug/dL   Iron Saturation 54 15 - 55 %  Ferritin  Result Value Ref Range   Ferritin 60 15 - 150 ng/mL      Assessment & Plan:   Problem List Items Addressed This Visit      Other   Depression, major, single episode, severe ([place])    Not under good control. Will restart her prozac and will try to get her into CCM to see about helping with medication compliance.       Relevant Medications   FLUoxetine (PROZAC) 40 MG capsule   Other Relevant Orders   Referral to Chronic Care Management Services    Other Visit Diagnoses    Muscle spasm    -  Primary   Of unclear etiology- likely due to dehydration. Will check labs and treat as needed. Call with any concerns.    Relevant Orders   Comprehensive metabolic panel (Completed)   CBC with Differential/Platelet (Completed)   Bayer DCA Hb A1c Waived (Completed)   TSH  (Completed)   VITAMIN D 25 Hydroxy (Vit-D Deficiency, Fractures) (Completed)   Iron and TIBC (Completed)   Ferritin (Completed)   Flu vaccine need       Flu shot given today.    Relevant Orders   Flu Vaccine QUAD High Dose(Fluad) (Completed)       Follow up plan: Return in about 4 weeks (around 06/19/2019).

## 2019-05-22 NOTE — Patient Instructions (Signed)
Call to make your mammogram appointment: [facility]  Address: [address], , [place] [address]  Phone: [phone]  

## 2019-05-23 ENCOUNTER — Telehealth: Payer: Self-pay | Admitting: Family Medicine

## 2019-05-23 LAB — COMPREHENSIVE METABOLIC PANEL
ALT: 21 IU/L (ref 0–32)
AST: 17 IU/L (ref 0–40)
Albumin/Globulin Ratio: 1.5 (ref 1.2–2.2)
Albumin: 4 g/dL (ref 3.7–4.7)
Alkaline Phosphatase: 69 IU/L (ref 39–117)
BUN/Creatinine Ratio: 17 (ref 12–28)
BUN: 19 mg/dL (ref 8–27)
Bilirubin Total: 0.6 mg/dL (ref 0.0–1.2)
CO2: 25 mmol/L (ref 20–29)
Calcium: 9.4 mg/dL (ref 8.7–10.3)
Chloride: 102 mmol/L (ref 96–106)
Creatinine, Ser: 1.09 mg/dL — ABNORMAL HIGH (ref 0.57–1.00)
GFR calc Af Amer: 57 mL/min/{1.73_m2} — ABNORMAL LOW (ref >59)
GFR calc non Af Amer: 50 mL/min/{1.73_m2} — ABNORMAL LOW (ref >59)
Globulin, Total: 2.7 g/dL (ref 1.5–4.5)
Glucose: 108 mg/dL — ABNORMAL HIGH (ref 65–99)
Potassium: 4.6 mmol/L (ref 3.5–5.2)
Sodium: 140 mmol/L (ref 134–144)
Total Protein: 6.7 g/dL (ref 6.0–8.5)

## 2019-05-23 LAB — CBC WITH DIFFERENTIAL/PLATELET
Basophils Absolute: 0.1 10*3/uL (ref 0.0–0.2)
Basos: 1 %
EOS (ABSOLUTE): 0.1 10*3/uL (ref 0.0–0.4)
Eos: 1 %
Hematocrit: 45.5 % (ref 34.0–46.6)
Hemoglobin: 15.6 g/dL (ref 11.1–15.9)
Immature Grans (Abs): 0 10*3/uL (ref 0.0–0.1)
Immature Granulocytes: 0 %
Lymphocytes Absolute: 2.9 10*3/uL (ref 0.7–3.1)
Lymphs: 26 %
MCH: 31.5 pg (ref 26.6–33.0)
MCHC: 34.3 g/dL (ref 31.5–35.7)
MCV: 92 fL (ref 79–97)
Monocytes Absolute: 0.9 10*3/uL (ref 0.1–0.9)
Monocytes: 8 %
Neutrophils Absolute: 7.4 10*3/uL — ABNORMAL HIGH (ref 1.4–7.0)
Neutrophils: 64 %
Platelets: 245 10*3/uL (ref 150–450)
RBC: 4.95 x10E6/uL (ref 3.77–5.28)
RDW: 12 % (ref 11.7–15.4)
WBC: 11.4 10*3/uL — ABNORMAL HIGH (ref 3.4–10.8)

## 2019-05-23 LAB — TSH: TSH: 3.21 u[IU]/mL (ref 0.450–4.500)

## 2019-05-23 LAB — IRON AND TIBC
Iron Saturation: 54 % (ref 15–55)
Iron: 178 ug/dL — ABNORMAL HIGH (ref 27–139)
Total Iron Binding Capacity: 332 ug/dL (ref 250–450)
UIBC: 154 ug/dL (ref 118–369)

## 2019-05-23 LAB — FERRITIN: Ferritin: 60 ng/mL (ref 15–150)

## 2019-05-23 LAB — VITAMIN D 25 HYDROXY (VIT D DEFICIENCY, FRACTURES): Vit D, 25-Hydroxy: 19.6 ng/mL — ABNORMAL LOW (ref 30.0–100.0)

## 2019-05-23 LAB — BAYER DCA HB A1C WAIVED: HB A1C (BAYER DCA - WAIVED): 5.8 % (ref <7.0)

## 2019-05-23 NOTE — Telephone Encounter (Signed)
Called and spoke to patient and patient's granddaughter [name], per patient request. Message relayed. (DPR reviewed) verbalized understanding.

## 2019-05-23 NOTE — Telephone Encounter (Signed)
Copied from [place] [id number]. Topic: General - Other >> May 23, 2019  4:30 PM Leward Quan A wrote: Reason for CRM: Patient called to request a call back about her lab results. She stated that [facility] say she would call her today but have not done so. She would also like the lab results mailed out to her. Ph# [phone]

## 2019-05-23 NOTE — Telephone Encounter (Signed)
See below

## 2019-05-23 NOTE — Telephone Encounter (Signed)
Please let her know that she was a little dehydrated and has some low vitamin D. I'd like her to increase her water and take a 1000IU D3 OTC. We'll recheck at her follow up

## 2019-05-25 ENCOUNTER — Encounter: Payer: Self-pay | Admitting: Family Medicine

## 2019-05-25 NOTE — Assessment & Plan Note (Signed)
Not under good control. Will restart her prozac and will try to get her into CCM to see about helping with medication compliance.

## 2019-06-06 ENCOUNTER — Ambulatory Visit: Payer: Medicare Other | Admitting: Family Medicine

## 2019-06-20 ENCOUNTER — Ambulatory Visit: Payer: Medicare Other | Admitting: Family Medicine

## 2019-06-25 ENCOUNTER — Telehealth: Payer: Medicaid Other

## 2019-06-27 ENCOUNTER — Other Ambulatory Visit: Payer: Self-pay

## 2019-06-27 ENCOUNTER — Encounter: Payer: Self-pay | Admitting: Family Medicine

## 2019-06-27 ENCOUNTER — Telehealth: Payer: Self-pay

## 2019-06-27 ENCOUNTER — Ambulatory Visit (INDEPENDENT_AMBULATORY_CARE_PROVIDER_SITE_OTHER): Payer: Medicare Other | Admitting: Family Medicine

## 2019-06-27 ENCOUNTER — Telehealth: Payer: Self-pay | Admitting: Family Medicine

## 2019-06-27 VITALS — BP 116/75 | HR 68 | Temp 97.9°F | Ht 63.0 in | Wt 176.0 lb

## 2019-06-27 DIAGNOSIS — F322 Major depressive disorder, single episode, severe without psychotic features: Secondary | ICD-10-CM | POA: Diagnosis not present

## 2019-06-27 MED ORDER — FLUOXETINE HCL 40 MG PO CAPS: 40.0000 mg | ORAL_CAPSULE | Freq: Every day | ORAL | 3 refills | Status: DC

## 2019-06-27 MED ORDER — FLUOXETINE HCL 40 MG PO CAPS: ORAL_CAPSULE | ORAL | 1 refills | Status: DC

## 2019-06-27 NOTE — Progress Notes (Signed)
BP 116/75   Pulse 68   Temp 97.9 F (36.6 C) (Oral)   Ht 5\' 3"  (1.6 m)   Wt 176 lb (79.8 kg)   LMP  (LMP Unknown)   SpO2 95%   BMI 31.18 kg/m    Subjective:    Patient ID: Renee Bridges, female    DOB: 03/31/1946, 73 y.o.   MRN: [id number]  HPI: Renee Bridges is a 73 y.o. female  Chief Complaint  Patient presents with  . Depression   DEPRESSION- her dog passed away a couple of weeks ago. Making her feel a little jittery- has cut down to 1 3x a week- and that is helping. Mood status: exacerbated Satisfied with current treatment?: no Symptom severity: moderate  Duration of current treatment : months Side effects: no Medication compliance: fair compliance Psychotherapy/counseling: no  Previous psychiatric medications: prozac Depressed mood: yes Anxious mood: yes Anhedonia: yes Significant weight loss or gain: no Insomnia: no  Fatigue: yes Feelings of worthlessness or guilt: yes Impaired concentration/indecisiveness: yes Suicidal ideations: no Hopelessness: no Crying spells: yes Depression screen [facility] 2/9 06/27/2019 05/22/2019 02/28/2019 01/24/2019 12/24/2018  Decreased Interest 1 1 0 0 0  Down, Depressed, Hopeless 2 0 1 0 1  PHQ - 2 Score 3 1 1  0 1  Altered sleeping 3 3 0 2 3  Tired, decreased energy 3 1 0 1 3  Change in appetite 3 3 1  0 3  Feeling bad or failure about yourself  0 0 0 0 0  Trouble concentrating 0 0 0 0 1  Moving slowly or fidgety/restless 0 0 1 0 3  Suicidal thoughts 0 0 0 0 0  PHQ-9 Score 12 8 3 3 14   Difficult doing work/chores Very difficult Somewhat difficult Somewhat difficult Not difficult at all Somewhat difficult  Some recent data might be hidden    Relevant past medical, surgical, family and social history reviewed and updated as indicated. Interim medical history since our last visit reviewed. Allergies and medications reviewed and updated.  Review of Systems  Constitutional: Negative.   Respiratory: Negative.   Cardiovascular:  Negative.   Skin: Negative.   Psychiatric/Behavioral: Positive for dysphoric mood and sleep disturbance. Negative for agitation, behavioral problems, confusion, decreased concentration, hallucinations, self-injury and suicidal ideas. The patient is nervous/anxious. The patient is not hyperactive.     Per HPI unless specifically indicated above     Objective:    BP 116/75   Pulse 68   Temp 97.9 F (36.6 C) (Oral)   Ht 5\' 3"  (1.6 m)   Wt 176 lb (79.8 kg)   LMP  (LMP Unknown)   SpO2 95%   BMI 31.18 kg/m   Wt Readings from Last 3 Encounters:  06/27/19 176 lb (79.8 kg)  05/22/19 178 lb (80.7 kg)  02/28/19 171 lb (77.6 kg)    Physical Exam Vitals signs and nursing note reviewed.  Constitutional:      General: She is not in acute distress.    Appearance: Normal appearance. She is not ill-appearing, toxic-appearing or diaphoretic.  HENT:     Head: Normocephalic and atraumatic.     Right Ear: External ear normal.     Left Ear: External ear normal.     Nose: Nose normal.     Mouth/Throat:     Mouth: Mucous membranes are moist.     Pharynx: Oropharynx is clear.  Eyes:     General: No scleral icterus.       Right eye:  No discharge.        Left eye: No discharge.     Extraocular Movements: Extraocular movements intact.     Conjunctiva/sclera: Conjunctivae normal.     Pupils: Pupils are equal, round, and reactive to light.  Neck:     Musculoskeletal: Normal range of motion and neck supple.  Cardiovascular:     Rate and Rhythm: Normal rate and regular rhythm.     Pulses: Normal pulses.     Heart sounds: Normal heart sounds. No murmur. No friction rub. No gallop.   Pulmonary:     Effort: Pulmonary effort is normal. No respiratory distress.     Breath sounds: Normal breath sounds. No stridor. No wheezing, rhonchi or rales.  Chest:     Chest wall: No tenderness.  Musculoskeletal: Normal range of motion.  Skin:    General: Skin is warm and dry.     Capillary Refill: Capillary  refill takes less than 2 seconds.     Coloration: Skin is not jaundiced or pale.     Findings: No bruising, erythema, lesion or rash.  Neurological:     General: No focal deficit present.     Mental Status: She is alert and oriented to person, place, and time. Mental status is at baseline.  Psychiatric:        Mood and Affect: Mood is depressed.        Behavior: Behavior normal.        Thought Content: Thought content normal.        Judgment: Judgment normal.     Results for orders placed or performed in visit on 05/22/19  Comprehensive metabolic panel  Result Value Ref Range   Glucose 108 (H) 65 - 99 mg/dL   BUN 19 8 - 27 mg/dL   Creatinine, Ser 4.091.09 (H) 0.57 - 1.00 mg/dL   GFR calc non Af Amer 50 (L) >59 mL/min/1.73   GFR calc Af Amer 57 (L) >59 mL/min/1.73   BUN/Creatinine Ratio 17 12 - 28   Sodium 140 134 - 144 mmol/L   Potassium 4.6 3.5 - 5.2 mmol/L   Chloride 102 96 - 106 mmol/L   CO2 25 20 - 29 mmol/L   Calcium 9.4 8.7 - 10.3 mg/dL   Total Protein 6.7 6.0 - 8.5 g/dL   Albumin 4.0 3.7 - 4.7 g/dL   Globulin, Total 2.7 1.5 - 4.5 g/dL   Albumin/Globulin Ratio 1.5 1.2 - 2.2   Bilirubin Total 0.6 0.0 - 1.2 mg/dL   Alkaline Phosphatase 69 39 - 117 IU/L   AST 17 0 - 40 IU/L   ALT 21 0 - 32 IU/L  CBC with Differential/Platelet  Result Value Ref Range   WBC 11.4 (H) 3.4 - 10.8 x10E3/uL   RBC 4.95 3.77 - 5.28 x10E6/uL   Hemoglobin 15.6 11.1 - 15.9 g/dL   Hematocrit 81.145.5 91.434.0 - 46.6 %   MCV 92 79 - 97 fL   MCH 31.5 26.6 - 33.0 pg   MCHC 34.3 31.5 - 35.7 g/dL   RDW 78.212.0 95.611.7 - 21.315.4 %   Platelets 245 150 - 450 x10E3/uL   Neutrophils 64 Not Estab. %   Lymphs 26 Not Estab. %   Monocytes 8 Not Estab. %   Eos 1 Not Estab. %   Basos 1 Not Estab. %   Neutrophils Absolute 7.4 (H) 1.4 - 7.0 x10E3/uL   Lymphocytes Absolute 2.9 0.7 - 3.1 x10E3/uL   Monocytes Absolute 0.9 0.1 - 0.9 x10E3/uL  EOS (ABSOLUTE) 0.1 0.0 - 0.4 x10E3/uL   Basophils Absolute 0.1 0.0 - 0.2 x10E3/uL    Immature Granulocytes 0 Not Estab. %   Immature Grans (Abs) 0.0 0.0 - 0.1 x10E3/uL  Bayer DCA Hb A1c Waived  Result Value Ref Range   HB A1C (BAYER DCA - WAIVED) 5.8 <7.0 %  TSH  Result Value Ref Range   TSH 3.210 0.450 - 4.500 uIU/mL  VITAMIN D 25 Hydroxy (Vit-D Deficiency, Fractures)  Result Value Ref Range   Vit D, 25-Hydroxy 19.6 (L) 30.0 - 100.0 ng/mL  Iron and TIBC  Result Value Ref Range   Total Iron Binding Capacity 332 250 - 450 ug/dL   UIBC 017 510 - 258 ug/dL   Iron 527 (H) 27 - 782 ug/dL   Iron Saturation 54 15 - 55 %  Ferritin  Result Value Ref Range   Ferritin 60 15 - 150 ng/mL      Assessment & Plan:   Problem List Items Addressed This Visit      Other   Depression, major, single episode, severe ([facility]) - Primary    Not doing well with the death of her dog. Will increase prozac to daily and recheck 1 month. Continue working with Child psychotherapist. Call with any concerns.       Relevant Medications   FLUoxetine (PROZAC) 40 MG capsule       Follow up plan: Return in about 1 month (around 07/28/2019) for Physical/wellness/follow up.

## 2019-06-27 NOTE — Telephone Encounter (Signed)
Chris from pharmacy called in and stated taht they need to clarify the FLUoxetine (PROZAC) 40 MG capsule [[id number]]  .  They have 2 different scritps  Best  [phone]

## 2019-06-27 NOTE — Assessment & Plan Note (Signed)
Not doing well with the death of her dog. Will increase prozac to daily and recheck 1 month. Continue working with Education officer, museum. Call with any concerns.

## 2019-06-27 NOTE — Telephone Encounter (Signed)
Called pharmacy, they state that they already received a call to cancel the incorrect RX.

## 2019-06-30 ENCOUNTER — Telehealth: Payer: Self-pay

## 2019-06-30 ENCOUNTER — Ambulatory Visit: Payer: Self-pay | Admitting: Licensed Clinical Social Worker

## 2019-06-30 NOTE — Chronic Care Management (AMB) (Signed)
  Care Management   Follow Up Note   06/30/2019 Name: Renee Bridges MRN: [id number] DOB: [birth year]  Referred by: Valerie Roys, DO Reason for referral : Care Coordination   Renee Bridges is a 73 y.o. year old female who is a primary care patient of Valerie Roys, DO. The care management team was consulted for assistance with care management and care coordination needs.    Review of patient status, including review of consultants reports, relevant laboratory and other test results, and collaboration with appropriate care team members and the patient's provider was performed as part of comprehensive patient evaluation and provision of chronic care management services.    LCSW completed CCM outreach attempt today but was unable to reach patient successfully. A HIPPA compliant voice message was left encouraging patient to return call once available. LCSW rescheduled CCM SW appointment as well.  A HIPPA compliant phone message was left for the patient providing contact information and requesting a return call.   Eula Fried, BSW, MSW, [facility] Care Management [email or web address] Phone: [phone]

## 2019-07-09 ENCOUNTER — Telehealth: Payer: Self-pay

## 2019-07-25 ENCOUNTER — Ambulatory Visit: Payer: Self-pay | Admitting: Licensed Clinical Social Worker

## 2019-07-25 ENCOUNTER — Telehealth: Payer: Self-pay

## 2019-07-25 NOTE — Chronic Care Management (AMB) (Signed)
  Care Management   Follow Up Note   07/25/2019 Name: Renee Bridges MRN: [id number] DOB: 09-21-1945  Referred by: Valerie Roys, DO Reason for referral : Care Coordination   Renee Bridges is a 73 y.o. year old female who is a primary care patient of Valerie Roys, DO. The care management team was consulted for assistance with care management and care coordination needs.    Review of patient status, including review of consultants reports, relevant laboratory and other test results, and collaboration with appropriate care team members and the patient's provider was performed as part of comprehensive patient evaluation and provision of chronic care management services.    LCSW completed CCM outreach attempt today but was unable to reach patient successfully. A HIPPA compliant voice message was left encouraging patient to return call once available. LCSW rescheduled CCM SW appointment as well.  A HIPPA compliant phone message was left for the patient providing contact information and requesting a return call.   Eula Fried, BSW, MSW, [facility] Care Management [email or web address] Phone: [phone]

## 2019-08-04 ENCOUNTER — Encounter: Payer: Medicare Other | Admitting: Family Medicine

## 2019-08-08 ENCOUNTER — Telehealth: Payer: Self-pay

## 2019-08-19 ENCOUNTER — Ambulatory Visit: Payer: Self-pay | Admitting: *Deleted

## 2019-08-19 ENCOUNTER — Telehealth: Payer: Self-pay

## 2019-08-19 NOTE — Chronic Care Management (AMB) (Signed)
  Chronic Care Management   Outreach Note  08/19/2019 Name: Renee Bridges MRN: [id number] DOB: 14-Jan-1946  Referred by: Valerie Roys, DO Reason for referral : Chronic Care Management (Unsuccessful Outreach )   An unsuccessful telephone outreach was attempted today. The patient was referred to the case management team by for assistance with care management and care coordination.   Follow Up Plan: A HIPPA compliant phone message was left for the patient providing contact information and requesting a return call.  The care management team will reach out to the patient again over the next 60 days.   Merlene Morse Otillia Cordone RN, BSN Nurse Case Editor, [facility] Care Management  [phone]) Business Mobile

## 2019-09-27 ENCOUNTER — Ambulatory Visit: Payer: Self-pay

## 2019-09-30 ENCOUNTER — Telehealth: Payer: Self-pay

## 2019-10-03 ENCOUNTER — Ambulatory Visit (INDEPENDENT_AMBULATORY_CARE_PROVIDER_SITE_OTHER): Payer: Medicare Other | Admitting: General Practice

## 2019-10-03 ENCOUNTER — Telehealth: Payer: Medicare Other | Admitting: General Practice

## 2019-10-03 ENCOUNTER — Telehealth: Payer: Medicare Other

## 2019-10-03 DIAGNOSIS — F322 Major depressive disorder, single episode, severe without psychotic features: Secondary | ICD-10-CM

## 2019-10-03 DIAGNOSIS — I10 Essential (primary) hypertension: Secondary | ICD-10-CM

## 2019-10-03 DIAGNOSIS — N183 Chronic kidney disease, stage 3 unspecified: Secondary | ICD-10-CM

## 2019-10-03 DIAGNOSIS — E782 Mixed hyperlipidemia: Secondary | ICD-10-CM

## 2019-10-03 NOTE — Patient Instructions (Signed)
Visit Information  Goals Addressed            This Visit's Progress   . RNCM: Chronic Disease Managment and suppor       Current Barriers:  . Chronic Disease Management support, education, and care coordination needs related to HTN, HLD, CKD Stage 3, and Depression  Clinical Goal(s) related to HTN, HLD, CKD Stage 3, and Depression:  Over the next 90 days, patient will:  . Work with the care management team to address educational, disease management, and care coordination needs  . Begin or continue self health monitoring activities as directed today Measure and record blood pressure 2 times per week . Call provider office for new or worsened signs and symptoms Blood pressure findings outside established parameters, New or worsened symptom related to HTN, HLD, Chronic Kidney Disease 3, and depression . Call care management team with questions or concerns . Verbalize basic understanding of patient centered plan of care established today  Interventions related to HTN, HLD, CKD Stage 3, and Depression:  . Evaluation of current treatment plans and patient's adherence to plan as established by provider . Assessed patient understanding of disease states . Assessed patient's education and care coordination needs . Provided disease specific education to patient  . Collaborated with appropriate clinical care team members regarding patient needs  Patient Self Care Activities related to HTN, HLD, CKD Stage 3, and Depression:  . Patient is unable to independently self-manage chronic health conditions  Initial goal documentation     . RNCM: Tell Dr. Wynetta Emery I stopped taking that other medicine. It makes me jittery       Current Barriers:  Marland Kitchen Knowledge Deficits related to understanding the significance of taking medications as prescribed for chronic medical conditions because the patient "feels good" . Lacks caregiver support. Daughter Lovey Newcomer Deceased x 2 years, Daughter Judeen Hammans not involved, has a  granddaughter [name] that helps when she can . Literacy barriers . Transportation barriers . Non-adherence to scheduled provider appointments . Non-adherence to prescribed medication regimen . Cognitive Deficits . Chronic Disease Management support and education needs related to HTN, HLD, Chronic Kidney Disease stage 3 and Depression  Nurse Case Manager Clinical Goal(s):  Marland Kitchen Over the next 90 days, patient will work with pcp and CCM team  to address needs related to management of chronic disease processes and taking prescribed medications as directed  . Over the next 90 days, patient will demonstrate a decrease in depression  exacerbations as evidenced by of the patient taking medications as directed . Over the next 90 days, patient will demonstrate improved health management independence as evidenced by following recommendations by the care team to help the patient with her health and well being . Over the next 90 days, patient will work with CM team pharmacist to manage medications and compliance concerns . Over the next 90 days, patient will work with CM clinical social worker to gain effective coping skills for her depression and the loss of her daughter 2 years ago . Over the next 90 days, patient will work with care guide team to assist with low income housing closer to her granddaughter in [place] . Over the next 90 days, the patient will demonstrate ongoing self health care management ability as evidenced by following the recommendations of the pcp and CCM team  Interventions:  . Evaluation of current treatment plan related to HTN, HLD, Chronic Kidney disease, and depression and patient's adherence to plan as established by provider. . Advised patient to  schedule an appointment to come in to see pcp: front office staff contacted to reach out to the granddaughter [name] to schedule an appointment  . Reviewed medications with patient and discussed noncompliance of medication regimen and  the need to take medications as directed  . Collaborated with pcp and CCM team  regarding noncompliance of treatment regimen for chronic disease management  . Discussed plans with patient for ongoing care management follow up and provided patient with direct contact information for care management team . Care Guide referral for assistance in finding low income housing in [place] so the patient can be closer to her granddaughter  . Social Work referral for depression and multiple issues related to grief and loss . Pharmacy referral for noncompliance to prescribed medication regimen- the patient confirmed she is not taking any medications because she "feels good" and she stopped taking that "other medicine" because it made her feel "jittery" (medication fluoxetine for her depression)  Patient Self Care Activities:  . Patient verbalizes understanding of plan to see the pcp in the next 30 days to evaluate health and wellbeing . Self administers medications as prescribed . Attends all scheduled provider appointments . Calls provider office for new concerns or questions . Unable to independently manage chronic disease processes  . Unable to self administer medications as prescribed . Does not adhere to provider recommendations re: care plan and taking medications as prescribed  . Lacks social connections  Initial goal documentation        Ms. Hoppel was given information about Chronic Care Management services today including:  1. CCM service includes personalized support from designated clinical staff supervised by her physician, including individualized plan of care and coordination with other care providers 2. 24/7 contact phone numbers for assistance for urgent and routine care needs. 3. Service will only be billed when office clinical staff spend 20 minutes or more in a month to coordinate care. 4. Only one practitioner may furnish and bill the service in a calendar month. 5. The patient  may stop CCM services at any time (effective at the end of the month) by phone call to the office staff. 6. The patient will be responsible for cost sharing (co-pay) of up to 20% of the service fee (after annual deductible is met).  Patient agreed to services and verbal consent obtained.   The patient verbalized understanding of instructions provided today and declined a print copy of patient instruction materials.   The care management team will reach out to the patient again over the next 60 days.   [name] RN, MSN, [facility] Mobile: [phone]

## 2019-10-03 NOTE — Chronic Care Management (AMB) (Signed)
Chronic Care Management   Initial Visit Note  10/03/2019 Name: Renee Bridges MRN: [id number] DOB: 11-22-1945  Referred by: Valerie Roys, DO Reason for referral : Chronic Care Management (Initial: HTN/HLD/Chronic kidney Disease/Depression)   Renee Bridges is a 74 y.o. year old female who is a primary care patient of Valerie Roys, DO. The CCM team was consulted for assistance with chronic disease management and care coordination needs related to HTN, HLD, CKD Stage 3 and Depression  Review of patient status, including review of consultants reports, relevant laboratory and other test results, and collaboration with appropriate care team members and the patient's provider was performed as part of comprehensive patient evaluation and provision of chronic care management services.    SDOH (Social Determinants of Health) screening performed today: Biomedical engineer  Food Insecurity  Depression   Social Connections Tobacco Use Stress. See Care Plan for related entries.   Medications: Outpatient Encounter Medications as of 10/03/2019  Medication Sig  . FLUoxetine (PROZAC) 40 MG capsule Take 1 capsule (40 mg total) by mouth daily. (Patient not taking: Reported on 10/03/2019)   No facility-administered encounter medications on file as of 10/03/2019.     Objective:   BP Readings from Last 3 Encounters:  06/27/19 116/75  05/22/19 135/79  02/28/19 138/89    Lab Results  Component Value Date   CHOL 224 (H) 04/03/2018   HDL 43 04/03/2018   LDLCALC 148 (H) 04/03/2018   TRIG 166 (H) 04/03/2018   Goals Addressed            This Visit's Progress   . RNCM: Chronic Disease Managment and suppor       Current Barriers:  . Chronic Disease Management support, education, and care coordination needs related to HTN, HLD, CKD Stage 3, and Depression  Clinical Goal(s) related to HTN, HLD, CKD Stage 3, and Depression:  Over the next 90 days, patient will:  . Work with  the care management team to address educational, disease management, and care coordination needs  . Begin or continue self health monitoring activities as directed today Measure and record blood pressure 2 times per week . Call provider office for new or worsened signs and symptoms Blood pressure findings outside established parameters, New or worsened symptom related to HTN, HLD, Chronic Kidney Disease 3, and depression . Call care management team with questions or concerns . Verbalize basic understanding of patient centered plan of care established today  Interventions related to HTN, HLD, CKD Stage 3, and Depression:  . Evaluation of current treatment plans and patient's adherence to plan as established by provider . Assessed patient understanding of disease states . Assessed patient's education and care coordination needs . Provided disease specific education to patient  . Collaborated with appropriate clinical care team members regarding patient needs  Patient Self Care Activities related to HTN, HLD, CKD Stage 3, and Depression:  . Patient is unable to independently self-manage chronic health conditions  Initial goal documentation     . RNCM: Tell Dr. Wynetta Emery I stopped taking that other medicine. It makes me jittery       Current Barriers:  Marland Kitchen Knowledge Deficits related to understanding the significance of taking medications as prescribed for chronic medical conditions because the patient "feels good" . Lacks caregiver support. Daughter Lovey Newcomer Deceased x 2 years, Daughter Judeen Hammans not involved, has a granddaughter [name] that helps when she can . Literacy barriers . Transportation barriers . Non-adherence to scheduled provider appointments .  Non-adherence to prescribed medication regimen . Cognitive Deficits . Chronic Disease Management support and education needs related to HTN, HLD, Chronic Kidney Disease stage 3 and Depression  Nurse Case Manager Clinical Goal(s):  Marland Kitchen Over the next  90 days, patient will work with pcp and CCM team  to address needs related to management of chronic disease processes and taking prescribed medications as directed  . Over the next 90 days, patient will demonstrate a decrease in depression  exacerbations as evidenced by of the patient taking medications as directed . Over the next 90 days, patient will demonstrate improved health management independence as evidenced by following recommendations by the care team to help the patient with her health and well being . Over the next 90 days, patient will work with CM team pharmacist to manage medications and compliance concerns . Over the next 90 days, patient will work with CM clinical social worker to gain effective coping skills for her depression and the loss of her daughter 2 years ago . Over the next 90 days, patient will work with care guide team to assist with low income housing closer to her granddaughter in [place] . Over the next 90 days, the patient will demonstrate ongoing self health care management ability as evidenced by following the recommendations of the pcp and CCM team  Interventions:  . Evaluation of current treatment plan related to HTN, HLD, Chronic Kidney disease, and depression and patient's adherence to plan as established by provider. . Advised patient to schedule an appointment to come in to see pcp: front office staff contacted to reach out to the granddaughter [name] to schedule an appointment  . Reviewed medications with patient and discussed noncompliance of medication regimen and the need to take medications as directed  . Collaborated with pcp and CCM team  regarding noncompliance of treatment regimen for chronic disease management  . Discussed plans with patient for ongoing care management follow up and provided patient with direct contact information for care management team . Care Guide referral for assistance in finding low income housing in [place] so the  patient can be closer to her granddaughter  . Social Work referral for depression and multiple issues related to grief and loss . Pharmacy referral for noncompliance to prescribed medication regimen- the patient confirmed she is not taking any medications because she "feels good" and she stopped taking that "other medicine" because it made her feel "jittery" (medication fluoxetine for her depression)  Patient Self Care Activities:  . Patient verbalizes understanding of plan to see the pcp in the next 30 days to evaluate health and wellbeing . Self administers medications as prescribed . Attends all scheduled provider appointments . Calls provider office for new concerns or questions . Unable to independently manage chronic disease processes  . Unable to self administer medications as prescribed . Does not adhere to provider recommendations re: care plan and taking medications as prescribed  . Lacks social connections  Initial goal documentation         Ms. Pryde was given information about Chronic Care Management services today including:  1. CCM service includes personalized support from designated clinical staff supervised by her physician, including individualized plan of care and coordination with other care providers 2. 24/7 contact phone numbers for assistance for urgent and routine care needs. 3. Service will only be billed when office clinical staff spend 20 minutes or more in a month to coordinate care. 4. Only one practitioner may furnish and bill the service in  a calendar month. 5. The patient may stop CCM services at any time (effective at the end of the month) by phone call to the office staff. 6. The patient will be responsible for cost sharing (co-pay) of up to 20% of the service fee (after annual deductible is met).  Patient agreed to services and verbal consent obtained.   Plan:   The care management team will reach out to the patient again over the next 60 days.    [name] RN, MSN, [facility] Mobile: [phone]

## 2019-10-06 ENCOUNTER — Telehealth: Payer: Self-pay | Admitting: Family Medicine

## 2019-10-06 NOTE — Chronic Care Management (AMB) (Signed)
  Chronic Care Management   Note  10/06/2019 Name: Renee Bridges MRN: [id number] DOB: 11/14/1945  Renee Bridges is a 74 y.o. year old female who is a primary care patient of [name], DO. Renee Bridges is currently enrolled in care management services. An additional referral for Pharmacist was placed.   Follow up plan: Unsuccessful telephone outreach attempt made to Granddaughter Renee Bridges Per Patient request, Voice mail box was full. Message was left with the patient providing contact information and requesting Renee Bridges to return our call.  The care management team will reach out to the patient again over the next 7 days.  If patient returns call to provider office, please advise to call Embedded Care Management Care Guide Penne Lash at [phone]  Penne Lash, RMA Care Guide, Embedded Care Coordination [facility]  [place], [place] [address] Direct Dial: [phone] [email or web address] Website: [email or web address]

## 2019-10-10 NOTE — Chronic Care Management (AMB) (Signed)
  Care Management   Note  10/10/2019 Name: JACARRA BOBAK MRN: [id number] DOB: 1946-02-04  SHAUNESSY DOBRATZ is a 74 y.o. year old female who is a primary care patient of [name], DO and is actively engaged with the care management team. I reached out to [name] granddaughter [name] by phone today to assist with scheduling an initial visit with the Pharmacist  Follow up plan: Telephone appointment with care management team member scheduled for:11/14/2018  Penne Lash, RMA Care Guide, Embedded Care Coordination [facility]  [place], [place] [address] Direct Dial: [phone] [email or web address] Website: [email or web address]

## 2019-10-20 ENCOUNTER — Telehealth: Payer: Self-pay | Admitting: Family Medicine

## 2019-10-20 NOTE — Telephone Encounter (Signed)
° ° °  Called pt's granddaughter [name].[place] regarding [employer] Referral for housing, vm box was full . Patient is a 74 y.o. female  and is under the care of [name], DO  Manuela Schwartz  Care Guide  Embedded Care Coordination [place]   Care Management ??[email or web address]   ??[phone]

## 2019-10-22 NOTE — Telephone Encounter (Signed)
Connecting with pt's granddaughter over MS teams and followed up by email with resources. Closing referral pending any other needs of patient.  Manuela Schwartz  Care Guide . Embedded Care Coordination [place]  Care Management ??[email or web address]  ??[phone]    EMAIL to pt granddaughter  From: Manuela Schwartz [facility])  Sent: Wednesday, October 22, 2019 3:04 PM To: [place], [email or web address]> Subject: Secure: Housing Resources for [employer], Please see attached the Assisted Living Resources for [facility] I have highlighted in yellow the two that are in [place].  This website is also a wonderful tool to help you locate options. Type in your zip code and fill out a few preliminary questions about your mother's status and they will compile a list and email it to you.  [email or web address]  Please let me know if you need anything further,   Manuela Schwartz  Care Guide . Embedded Care Coordination   Care Management ??[email or web address]  ??[phone]

## 2019-10-31 ENCOUNTER — Ambulatory Visit (INDEPENDENT_AMBULATORY_CARE_PROVIDER_SITE_OTHER): Payer: Medicare Other | Admitting: Licensed Clinical Social Worker

## 2019-10-31 DIAGNOSIS — I10 Essential (primary) hypertension: Secondary | ICD-10-CM

## 2019-10-31 DIAGNOSIS — N183 Chronic kidney disease, stage 3 unspecified: Secondary | ICD-10-CM

## 2019-10-31 DIAGNOSIS — R413 Other amnesia: Secondary | ICD-10-CM

## 2019-10-31 DIAGNOSIS — F322 Major depressive disorder, single episode, severe without psychotic features: Secondary | ICD-10-CM

## 2019-10-31 NOTE — Chronic Care Management (AMB) (Signed)
Chronic Care Management    Clinical Social Work Follow Up Note  10/31/2019 Name: Renee Bridges MRN: [id number] DOB: [birth year]  Renee Bridges is a 74 y.o. year old female who is a primary care patient of Valerie Roys, DO. The CCM team was consulted for assistance with Mental Health Counseling and Resources.   Review of patient status, including review of consultants reports, other relevant assessments, and collaboration with appropriate care team members and the patient's provider was performed as part of comprehensive patient evaluation and provision of chronic care management services.    SDOH (Social Determinants of Health) assessments performed: Yes    Advanced Directives Status: <no information> See Care Plan for related entries.   Outpatient Encounter Medications as of 10/31/2019  Medication Sig  . FLUoxetine (PROZAC) 40 MG capsule Take 1 capsule (40 mg total) by mouth daily. (Patient not taking: Reported on 10/03/2019)   No facility-administered encounter medications on file as of 10/31/2019.     Goals Addressed    . SW: We need more help and support with Asa Lente (pt-stated)       Current Barriers:  . Chronic Mental Health needs related to Depression and Grief . Financial constraints related to managing health care expenses . Limited social support . ADL IADL limitations . Mental Health Concerns  . Limited access to caregiver . Cognitive Deficits . Memory Deficits . Inability to perform ADL's independently . Inability to perform IADL's independently . Suicidal Ideation/Homicidal Ideation: No  Clinical Social Work Goal(s):  Marland Kitchen Over the next 120 days, patient will work with SW  bi-monthly  by telephone or in person to reduce or manage symptoms related to depression, grief, anxiety and ongoing stressors . Over the next 120 days, patient will demonstrate improved health management independence as evidenced by implementing positive self-care into her daily  routines  Interventions: . Patient interviewed and appropriate assessments performed: brief mental health assessment . Provided family with personal care service resource education . Patient interviewed and appropriate assessments performed . Provided mental health counseling with regard to grief  . Provided patient with information about available mental health resources within the nearby area . Discussed plans with patient for ongoing care management follow up and provided patient with direct contact information for care management team . Advised patient to contact CCM LCSW directly if case management needs arise . Collaborated with primary care provider re: Patient is no longer taking her prozac because it makes her feel "jittery" and family feel she needs an alternative anti-depressant for her ongoing mental health needs . Assisted patient/caregiver with obtaining information about health plan benefits . Provided education and assistance to client regarding Advanced Directives. . A voluntary and extensive discussion about advanced care planning including explanation and discussion of advanced was undertaken with the patient and Renee Bridges (caretaker). Explanation regarding healthcare proxy and living will was reviewed and packet with forms with explanation of how to fill them out was given.   . Provided education to patient/caregiver regarding level of care options. . Provided education to patient/caregiver about Hospice and/or Palliative Care services . Encouraged family to consider [place] care for grief follow up and therapy/counseling . Mindfulness or Relaxation Training education provided to family  Patient Self Care Activities:  . Attends all scheduled provider appointments . Strong family or social support  Patient Coping Strengths:  . Family . Hopefulness  Patient Self Care Deficits:  . Lacks social connections . Unable to perform ADLs independently . Unable to perform IADLs  independently  Initial goal documentation      Follow Up Plan: SW will follow up with patient by phone over the next quarter  Dickie La, BSW, MSW, LCSW [facility] Care Management [place]  [facility] [email or web address] Phone: [phone]

## 2019-11-01 ENCOUNTER — Ambulatory Visit: Payer: Self-pay

## 2019-11-11 ENCOUNTER — Telehealth: Payer: Medicare Other | Admitting: Family Medicine

## 2019-11-12 ENCOUNTER — Telehealth: Payer: Medicare Other | Admitting: Family Medicine

## 2019-11-13 ENCOUNTER — Ambulatory Visit: Payer: Medicare Other

## 2019-11-14 ENCOUNTER — Ambulatory Visit (INDEPENDENT_AMBULATORY_CARE_PROVIDER_SITE_OTHER): Payer: Medicare Other | Admitting: Pharmacist

## 2019-11-14 ENCOUNTER — Telehealth: Payer: Medicare Other

## 2019-11-14 DIAGNOSIS — F322 Major depressive disorder, single episode, severe without psychotic features: Secondary | ICD-10-CM | POA: Diagnosis not present

## 2019-11-14 DIAGNOSIS — R4182 Altered mental status, unspecified: Secondary | ICD-10-CM

## 2019-11-14 NOTE — Patient Instructions (Signed)
Visit Information  Goals Addressed            This Visit's Progress     Patient Stated   . PharmD "Grandma doesn't take her medication" (pt-stated)       CARE PLAN ENTRY (see longtitudinal plan of care for additional care plan information)  Current Barriers:  . Polypharmacy; complex patient with multiple comorbidities including HTN, CKD, anxiety/depression, chronic pain . Granddaughter, [name], notes that her grandmother has self-discontinued fluoxetine due to feeling "jittery" and feeling like she "doesn't need it" o Depression/anxiety: prescribed fluoxetine, increased to 40 mg daily by PCP at last appointment in October. [name] notes that she feels patient does better with the medication, but that patient does not feel this way. [name] indicates biggest problems are mood swings, including being quick to anger. - Hx: Citalopram, duloxetine, escitalopram, mirtazapine, trazodone  Pharmacist Clinical Goal(s):  Marland Kitchen Over the next 90 days, patient will work with PharmD and provider towards optimized medication management  Interventions: . Comprehensive medication review performed; medication list updated in electronic medical record . Discussed w/ [name] principals of antidepressant medications and requiring daily dosing for at least 4-6 weeks to see full benefit. She agrees and understands, though notes that her grandmother does not . Upcoming appointment w/ PCP next week. Could consider quetiapine to target mood. Will pass recommendation along to PCP.  Marland Kitchen Continue to collaborate w/ LCSW and RN CM for comprehensive support.   Patient Self Care Activities:  . Patient will attend PCP visit next week  Initial goal documentation        Patient verbalizes understanding of instructions provided today.    Plan: - Scheduled f/u call 12/12/19 to follow up on medication management decisions  Catie Feliz Beam, PharmD, [facility] Clinical Pharmacist [facility] [phone]

## 2019-11-14 NOTE — Chronic Care Management (AMB) (Signed)
Chronic Care Management   Note  11/14/2019 Name: Renee Bridges MRN: [id number] DOB: 21-Apr-1946   Subjective:  Renee Bridges is a 74 y.o. year old female who is a primary care patient of Valerie Roys, DO. The CCM team was consulted for assistance with chronic disease management and care coordination needs.    Contacted patient's granddaughter, [name], for medication management review. Attempted to also contact patient from clinic phone, but she immediately hung up on me.   Review of patient status, including review of consultants reports, laboratory and other test data, was performed as part of comprehensive evaluation and provision of chronic care management services.   SDOH (Social Determinants of Health) assessments and interventions performed:  no  Objective:  Lab Results  Component Value Date   CREATININE 1.09 (H) 05/22/2019   CREATININE 1.18 (H) 09/12/2018   CREATININE 1.04 (H) 04/03/2018    Lab Results  Component Value Date   HGBA1C 5.8 05/22/2019       Component Value Date/Time   CHOL 224 (H) 04/03/2018 1416   TRIG 166 (H) 04/03/2018 1416   HDL 43 04/03/2018 1416   LDLCALC 148 (H) 04/03/2018 1416    Clinical ASCVD: No  The 10-year ASCVD risk score Mikey Bussing DC Jr., et al., 2013) is: 13.5%   Values used to calculate the score:     Age: 74 years     Sex: Female     Is Non-Hispanic African American: No     Diabetic: No     Tobacco smoker: No     Systolic Blood Pressure: 585 mmHg     Is BP treated: No     HDL Cholesterol: 43 mg/dL     Total Cholesterol: 224 mg/dL    BP Readings from Last 3 Encounters:  06/27/19 116/75  05/22/19 135/79  02/28/19 138/89    Allergies  Allergen Reactions  . Bactrim [Sulfamethoxazole-Trimethoprim] Other (See Comments)    Sweating, feeling like throat is closing up  . Codeine Rash    Medications Reviewed Today    Reviewed by De Hollingshead, [facility] (Pharmacist) on 11/14/19 at 1309  Med List Status: <None>  Medication  Order Taking? Sig Documenting Provider Last Dose Status Informant  FLUoxetine (PROZAC) 40 MG capsule [id number] No Take 1 capsule (40 mg total) by mouth daily.  Patient not taking: Reported on 1/76/2021   Valerie Roys, DO Not Taking Active            Assessment:   Goals Addressed            This Visit's Progress     Patient Stated   . PharmD "Grandma doesn't take her medication" (pt-stated)       CARE PLAN ENTRY (see longtitudinal plan of care for additional care plan information)  Current Barriers:  . Polypharmacy; complex patient with multiple comorbidities including HTN, CKD, anxiety/depression, chronic pain . Granddaughter, [name], notes that her grandmother has self-discontinued fluoxetine due to feeling "jittery" and feeling like she "doesn't need it" o Depression/anxiety: prescribed fluoxetine, increased to 40 mg daily by PCP at last appointment in October. [name] notes that she feels patient does better with the medication, but that patient does not feel this way. [name] indicates biggest problems are mood swings, including being quick to anger. - Hx: Citalopram, duloxetine, escitalopram, mirtazapine, trazodone  Pharmacist Clinical Goal(s):  Marland Kitchen Over the next 90 days, patient will work with PharmD and provider towards optimized medication management  Interventions: . Comprehensive medication review performed;  medication list updated in electronic medical record . Discussed w/ [name] principals of antidepressant medications and requiring daily dosing for at least 4-6 weeks to see full benefit. She agrees and understands, though notes that her grandmother does not . Upcoming appointment w/ PCP next week. Could consider quetiapine to target mood. Will pass recommendation along to PCP.  Marland Kitchen Continue to collaborate w/ LCSW and RN CM for comprehensive support.   Patient Self Care Activities:  . Patient will attend PCP visit next week  Initial goal documentation          Plan: - Scheduled f/u call 12/12/19 to follow up on medication management decisions  Catie Feliz Beam, PharmD, [facility] Clinical Pharmacist [facility] [phone]

## 2019-11-21 ENCOUNTER — Ambulatory Visit (INDEPENDENT_AMBULATORY_CARE_PROVIDER_SITE_OTHER): Payer: Medicare Other | Admitting: Family Medicine

## 2019-11-21 ENCOUNTER — Other Ambulatory Visit: Payer: Self-pay

## 2019-11-21 ENCOUNTER — Encounter: Payer: Self-pay | Admitting: Family Medicine

## 2019-11-21 ENCOUNTER — Telehealth: Payer: Self-pay

## 2019-11-21 VITALS — BP 133/83 | HR 79 | Temp 97.4°F | Ht 63.0 in | Wt 176.0 lb

## 2019-11-21 DIAGNOSIS — N183 Chronic kidney disease, stage 3 unspecified: Secondary | ICD-10-CM | POA: Diagnosis not present

## 2019-11-21 DIAGNOSIS — I129 Hypertensive chronic kidney disease with stage 1 through stage 4 chronic kidney disease, or unspecified chronic kidney disease: Secondary | ICD-10-CM

## 2019-11-21 DIAGNOSIS — N3941 Urge incontinence: Secondary | ICD-10-CM

## 2019-11-21 DIAGNOSIS — F322 Major depressive disorder, single episode, severe without psychotic features: Secondary | ICD-10-CM | POA: Diagnosis not present

## 2019-11-21 DIAGNOSIS — E782 Mixed hyperlipidemia: Secondary | ICD-10-CM

## 2019-11-21 DIAGNOSIS — F419 Anxiety disorder, unspecified: Secondary | ICD-10-CM

## 2019-11-21 MED ORDER — QUETIAPINE FUMARATE ER 50 MG PO TB24: 50.0000 mg | ORAL_TABLET | Freq: Every day | ORAL | 3 refills | Status: DC

## 2019-11-21 NOTE — Assessment & Plan Note (Signed)
States that she is doing better, but off and on. Will try XR seroquel at bedtime to help sleep and help with anxiety during the day. Call with any concerns. Recheck 1 month.

## 2019-11-21 NOTE — Progress Notes (Signed)
BP 133/83   Pulse 79   Temp (!) 97.4 F (36.3 C) (Oral)   Ht 5\' 3"  (1.6 m)   Wt 176 lb (79.8 kg)   LMP  (LMP Unknown)   SpO2 96%   BMI 31.18 kg/m    Subjective:    Patient ID: Renee Bridges, female    DOB: 04-Aug-1946, 74 y.o.   MRN: [id number]  HPI: Renee Bridges is a 74 y.o. female  Chief Complaint  Patient presents with  . Depression   ANXIETY/DEPRESSION- stopped taking her fluoxetine. She notes that it was making her jittery. She notes that she is feeling better. She got a new dog and feels like that has helped a lot.  Duration:stable Anxious mood: yes  Excessive worrying: yes Irritability: no  Sweating: no Nausea: no Palpitations:no Hyperventilation: no Panic attacks: yes Agoraphobia: no  Obscessions/compulsions: no Depressed mood: yes Depression screen [facility] 2/9 11/21/2019 06/27/2019 05/22/2019 02/28/2019 01/24/2019  Decreased Interest 0 1 1 0 0  Down, Depressed, Hopeless 0 2 0 1 0  PHQ - 2 Score 0 3 1 1  0  Altered sleeping 3 3 3  0 2  Tired, decreased energy 0 3 1 0 1  Change in appetite 2 3 3 1  0  Feeling bad or failure about yourself  0 0 0 0 0  Trouble concentrating 0 0 0 0 0  Moving slowly or fidgety/restless 2 0 0 1 0  Suicidal thoughts 0 0 0 0 0  PHQ-9 Score 7 12 8 3 3   Difficult doing work/chores Somewhat difficult Very difficult Somewhat difficult Somewhat difficult Not difficult at all  Some recent data might be hidden   Anhedonia: no Weight changes: no Insomnia: yes hard to stay asleep  Hypersomnia: no Fatigue/loss of energy: yes Feelings of worthlessness: no Feelings of guilt: no Impaired concentration/indecisiveness: no Suicidal ideations: no  Crying spells: yes Recent Stressors/Life Changes: yes   Relationship problems: no   Family stress: no     Financial stress: yes    Job stress: no    Recent death/loss: yes  HYPERTENSION / HYPERLIPIDEMIA Satisfied with current treatment? yes Duration of hypertension: chronic BP monitoring  frequency: not checking BP medication side effects: not on anything Past BP meds: none now Duration of hyperlipidemia: chronic Cholesterol medication side effects: not on anything Cholesterol supplements: none Aspirin: no Recent stressors: yes Recurrent headaches: no Visual changes: no Palpitations: no Dyspnea: no Chest pain: no Lower extremity edema: no Dizzy/lightheaded: no  Relevant past medical, surgical, family and social history reviewed and updated as indicated. Interim medical history since our last visit reviewed. Allergies and medications reviewed and updated.  Review of Systems  Constitutional: Negative.   HENT: Negative.   Respiratory: Negative.   Cardiovascular: Negative.   Musculoskeletal: Negative.   Skin: Negative.   Psychiatric/Behavioral: Positive for confusion, decreased concentration, dysphoric mood and sleep disturbance. Negative for agitation, behavioral problems, hallucinations, self-injury and suicidal ideas. The patient is nervous/anxious. The patient is not hyperactive.     Per HPI unless specifically indicated above     Objective:    BP 133/83   Pulse 79   Temp (!) 97.4 F (36.3 C) (Oral)   Ht 5\' 3"  (1.6 m)   Wt 176 lb (79.8 kg)   LMP  (LMP Unknown)   SpO2 96%   BMI 31.18 kg/m   Wt Readings from Last 3 Encounters:  11/21/19 176 lb (79.8 kg)  06/27/19 176 lb (79.8 kg)  05/22/19 178 lb (80.7  kg)    Physical Exam Vitals and nursing note reviewed.  Constitutional:      General: She is not in acute distress.    Appearance: Normal appearance. She is not ill-appearing, toxic-appearing or diaphoretic.  HENT:     Head: Normocephalic and atraumatic.     Right Ear: External ear normal.     Left Ear: External ear normal.     Nose: Nose normal.     Mouth/Throat:     Mouth: Mucous membranes are moist.     Pharynx: Oropharynx is clear.  Eyes:     General: No scleral icterus.       Right eye: No discharge.        Left eye: No discharge.      Extraocular Movements: Extraocular movements intact.     Conjunctiva/sclera: Conjunctivae normal.     Pupils: Pupils are equal, round, and reactive to light.  Cardiovascular:     Rate and Rhythm: Normal rate and regular rhythm.     Pulses: Normal pulses.     Heart sounds: Normal heart sounds. No murmur. No friction rub. No gallop.   Pulmonary:     Effort: Pulmonary effort is normal. No respiratory distress.     Breath sounds: Normal breath sounds. No stridor. No wheezing, rhonchi or rales.  Chest:     Chest wall: No tenderness.  Musculoskeletal:        General: Normal range of motion.     Cervical back: Normal range of motion and neck supple.  Skin:    General: Skin is warm and dry.     Capillary Refill: Capillary refill takes less than 2 seconds.     Coloration: Skin is not jaundiced or pale.     Findings: No bruising, erythema, lesion or rash.  Neurological:     General: No focal deficit present.     Mental Status: She is alert and oriented to person, place, and time. Mental status is at baseline.  Psychiatric:        Mood and Affect: Mood normal.        Behavior: Behavior normal.        Thought Content: Thought content normal.        Judgment: Judgment normal.     Results for orders placed or performed in visit on 05/22/19  Comprehensive metabolic panel  Result Value Ref Range   Glucose 108 (H) 65 - 99 mg/dL   BUN 19 8 - 27 mg/dL   Creatinine, Ser 1.09 (H) 0.57 - 1.00 mg/dL   GFR calc non Af Amer 50 (L) >59 mL/min/1.73   GFR calc Af Amer 57 (L) >59 mL/min/1.73   BUN/Creatinine Ratio 17 12 - 28   Sodium 140 134 - 144 mmol/L   Potassium 4.6 3.5 - 5.2 mmol/L   Chloride 102 96 - 106 mmol/L   CO2 25 20 - 29 mmol/L   Calcium 9.4 8.7 - 10.3 mg/dL   Total Protein 6.7 6.0 - 8.5 g/dL   Albumin 4.0 3.7 - 4.7 g/dL   Globulin, Total 2.7 1.5 - 4.5 g/dL   Albumin/Globulin Ratio 1.5 1.2 - 2.2   Bilirubin Total 0.6 0.0 - 1.2 mg/dL   Alkaline Phosphatase 69 39 - 117 IU/L   AST 17  0 - 40 IU/L   ALT 21 0 - 32 IU/L  CBC with Differential/Platelet  Result Value Ref Range   WBC 11.4 (H) 3.4 - 10.8 x10E3/uL   RBC 4.95 3.77 - 5.28 x10E6/uL  Hemoglobin 15.6 11.1 - 15.9 g/dL   Hematocrit 22.6 33.3 - 46.6 %   MCV 92 79 - 97 fL   MCH 31.5 26.6 - 33.0 pg   MCHC 34.3 31.5 - 35.7 g/dL   RDW 54.5 62.5 - 63.8 %   Platelets 245 150 - 450 x10E3/uL   Neutrophils 64 Not Estab. %   Lymphs 26 Not Estab. %   Monocytes 8 Not Estab. %   Eos 1 Not Estab. %   Basos 1 Not Estab. %   Neutrophils Absolute 7.4 (H) 1.4 - 7.0 x10E3/uL   Lymphocytes Absolute 2.9 0.7 - 3.1 x10E3/uL   Monocytes Absolute 0.9 0.1 - 0.9 x10E3/uL   EOS (ABSOLUTE) 0.1 0.0 - 0.4 x10E3/uL   Basophils Absolute 0.1 0.0 - 0.2 x10E3/uL   Immature Granulocytes 0 Not Estab. %   Immature Grans (Abs) 0.0 0.0 - 0.1 x10E3/uL  Bayer DCA Hb A1c Waived  Result Value Ref Range   HB A1C (BAYER DCA - WAIVED) 5.8 <7.0 %  TSH  Result Value Ref Range   TSH 3.210 0.450 - 4.500 uIU/mL  VITAMIN D 25 Hydroxy (Vit-D Deficiency, Fractures)  Result Value Ref Range   Vit D, 25-Hydroxy 19.6 (L) 30.0 - 100.0 ng/mL  Iron and TIBC  Result Value Ref Range   Total Iron Binding Capacity 332 250 - 450 ug/dL   UIBC 937 342 - 876 ug/dL   Iron 811 (H) 27 - 572 ug/dL   Iron Saturation 54 15 - 55 %  Ferritin  Result Value Ref Range   Ferritin 60 15 - 150 ng/mL      Assessment & Plan:   Problem List Items Addressed This Visit      Genitourinary   Benign hypertensive renal disease    Under good control off medicine. Continue to monitor. Call with any concerns. Checking labs today.      Relevant Orders   CBC with Differential/Platelet   Comprehensive metabolic panel   TSH   Chronic kidney disease, stage 3    Rechecking labs today. Await results. Call with any concerns.       Relevant Orders   CBC with Differential/Platelet   Comprehensive metabolic panel   Microalbumin, Urine Waived     Other   Urge incontinence     Rechecking urine today. Await results. Call with any concerns.       Relevant Orders   CBC with Differential/Platelet   Comprehensive metabolic panel   UA/M w/rflx Culture, Routine   Anxiety    States that she is doing better, but off and on. Will try XR seroquel at bedtime to help sleep and help with anxiety during the day. Call with any concerns. Recheck 1 month.       Relevant Orders   CBC with Differential/Platelet   Comprehensive metabolic panel   TSH   Depression, major, single episode, severe (HCC) - Primary    States that she is doing better, but off and on. Will try XR seroquel at bedtime to help sleep and help with anxiety during the day. Call with any concerns. Recheck 1 month.       Relevant Orders   CBC with Differential/Platelet   Comprehensive metabolic panel   TSH   Hyperlipidemia    Rechecking labs today. Await results. Call with any concerns.       Relevant Orders   CBC with Differential/Platelet   Comprehensive metabolic panel   Lipid Panel w/o Chol/HDL Ratio  Follow up plan: Return in about 4 weeks (around 12/19/2019) for Physical/wellness.

## 2019-11-21 NOTE — Assessment & Plan Note (Signed)
Rechecking urine today. Await results. Call with any concerns.  

## 2019-11-21 NOTE — Assessment & Plan Note (Signed)
Rechecking labs today. Await results. Call with any concerns.  

## 2019-11-21 NOTE — Assessment & Plan Note (Signed)
Under good control off medicine. Continue to monitor. Call with any concerns. Checking labs today.

## 2019-11-21 NOTE — Assessment & Plan Note (Signed)
States that she is doing better, but off and on. Will try XR seroquel at bedtime to help sleep and help with anxiety during the day. Call with any concerns. Recheck 1 month.  

## 2019-11-22 LAB — CBC WITH DIFFERENTIAL/PLATELET
Basophils Absolute: 0 10*3/uL (ref 0.0–0.2)
Basos: 1 %
EOS (ABSOLUTE): 0.4 10*3/uL (ref 0.0–0.4)
Eos: 6 %
Hematocrit: 48.7 % — ABNORMAL HIGH (ref 34.0–46.6)
Hemoglobin: 15.8 g/dL (ref 11.1–15.9)
Immature Grans (Abs): 0 10*3/uL (ref 0.0–0.1)
Immature Granulocytes: 0 %
Lymphocytes Absolute: 1.8 10*3/uL (ref 0.7–3.1)
Lymphs: 27 %
MCH: 31 pg (ref 26.6–33.0)
MCHC: 32.4 g/dL (ref 31.5–35.7)
MCV: 96 fL (ref 79–97)
Monocytes Absolute: 0.8 10*3/uL (ref 0.1–0.9)
Monocytes: 13 %
Neutrophils Absolute: 3.6 10*3/uL (ref 1.4–7.0)
Neutrophils: 53 %
Platelets: 245 10*3/uL (ref 150–450)
RBC: 5.09 x10E6/uL (ref 3.77–5.28)
RDW: 12 % (ref 11.7–15.4)
WBC: 6.7 10*3/uL (ref 3.4–10.8)

## 2019-11-22 LAB — COMPREHENSIVE METABOLIC PANEL
ALT: 18 IU/L (ref 0–32)
AST: 27 IU/L (ref 0–40)
Albumin/Globulin Ratio: 1.6 (ref 1.2–2.2)
Albumin: 4.2 g/dL (ref 3.7–4.7)
Alkaline Phosphatase: 86 IU/L (ref 39–117)
BUN/Creatinine Ratio: 11 — ABNORMAL LOW (ref 12–28)
BUN: 13 mg/dL (ref 8–27)
Bilirubin Total: 0.5 mg/dL (ref 0.0–1.2)
CO2: 22 mmol/L (ref 20–29)
Calcium: 9.7 mg/dL (ref 8.7–10.3)
Chloride: 105 mmol/L (ref 96–106)
Creatinine, Ser: 1.2 mg/dL — ABNORMAL HIGH (ref 0.57–1.00)
GFR calc Af Amer: 51 mL/min/{1.73_m2} — ABNORMAL LOW (ref >59)
GFR calc non Af Amer: 44 mL/min/{1.73_m2} — ABNORMAL LOW (ref >59)
Globulin, Total: 2.7 g/dL (ref 1.5–4.5)
Glucose: 99 mg/dL (ref 65–99)
Potassium: 5 mmol/L (ref 3.5–5.2)
Sodium: 143 mmol/L (ref 134–144)
Total Protein: 6.9 g/dL (ref 6.0–8.5)

## 2019-11-22 LAB — LIPID PANEL W/O CHOL/HDL RATIO
Cholesterol, Total: 197 mg/dL (ref 100–199)
HDL: 36 mg/dL — ABNORMAL LOW (ref >39)
LDL Chol Calc (NIH): 135 mg/dL — ABNORMAL HIGH (ref 0–99)
Triglycerides: 146 mg/dL (ref 0–149)
VLDL Cholesterol Cal: 26 mg/dL (ref 5–40)

## 2019-11-22 LAB — TSH: TSH: 3.37 u[IU]/mL (ref 0.450–4.500)

## 2019-11-24 LAB — UA/M W/RFLX CULTURE, ROUTINE
Bilirubin, UA: NEGATIVE
Glucose, UA: NEGATIVE
Nitrite, UA: NEGATIVE
Specific Gravity, UA: 1.02 (ref 1.005–1.030)
Urobilinogen, Ur: 1 mg/dL (ref 0.2–1.0)
pH, UA: 6 (ref 5.0–7.5)

## 2019-11-24 LAB — URINE CULTURE, REFLEX

## 2019-11-24 LAB — MICROSCOPIC EXAMINATION

## 2019-11-24 LAB — MICROALBUMIN, URINE WAIVED
Creatinine, Urine Waived: 300 mg/dL (ref 10–300)
Microalb, Ur Waived: 80 mg/L — ABNORMAL HIGH (ref 0–19)

## 2019-11-25 ENCOUNTER — Ambulatory Visit: Payer: Medicaid Other

## 2019-11-25 ENCOUNTER — Ambulatory Visit: Payer: Self-pay | Admitting: General Practice

## 2019-11-25 NOTE — Chronic Care Management (AMB) (Signed)
°  Chronic Care Management   Outreach Note  11/25/2019 Name: Renee Bridges MRN: [id number] DOB: 1945/11/26  Referred by: [name], DO Reason for referral : Chronic Care Management (follow up on med compliance and depression and other chronic conditions-Attempt)   An unsuccessful telephone outreach was attempted today. The patient was referred to the case management team for assistance with care management and care coordination.   Follow Up Plan: A HIPPA compliant phone message was left for the patient providing contact information and requesting a return call.   Alto Denver RN, MSN, CCM Community Care Coordinator [place]   [facility] Mobile: [phone]

## 2019-12-04 ENCOUNTER — Ambulatory Visit: Payer: Medicare Other

## 2019-12-12 ENCOUNTER — Telehealth: Payer: Medicaid Other

## 2019-12-15 ENCOUNTER — Ambulatory Visit (INDEPENDENT_AMBULATORY_CARE_PROVIDER_SITE_OTHER): Payer: Medicare Other | Admitting: Family Medicine

## 2019-12-15 ENCOUNTER — Other Ambulatory Visit: Payer: Self-pay

## 2019-12-15 ENCOUNTER — Encounter: Payer: Self-pay | Admitting: Family Medicine

## 2019-12-15 DIAGNOSIS — F419 Anxiety disorder, unspecified: Secondary | ICD-10-CM | POA: Diagnosis not present

## 2019-12-15 DIAGNOSIS — F339 Major depressive disorder, recurrent, unspecified: Secondary | ICD-10-CM

## 2019-12-15 NOTE — Assessment & Plan Note (Signed)
See discussion under depression.  ?

## 2019-12-15 NOTE — Assessment & Plan Note (Addendum)
Really not doing well. In exacerbation. Has not been taking her medicine. Does not want counseling at this time. Strongly encouraged her to take her medicine daily for the next 2 weeks. Will revisit counseling again at that point. Will have LCSW reach out to see if she can help. Continue to monitor closely.

## 2019-12-15 NOTE — Progress Notes (Signed)
BP (!) 155/77 (BP Location: Left Arm, Patient Position: Sitting, Cuff Size: Normal)   Pulse 71   Temp 97.8 F (36.6 C) (Oral)   Ht 5' 1.61" (1.565 m)   Wt 176 lb 3.2 oz (79.9 kg)   LMP  (LMP Unknown)   SpO2 97%   BMI 32.63 kg/m    Subjective:    Patient ID: Renee Bridges, female    DOB: 1946-04-24, 74 y.o.   MRN: [id number]  HPI: Renee Bridges is a 74 y.o. female  Chief Complaint  Patient presents with  . Anxiety   ANXIETY/DEPRESSION- not doing well. Her dog is too big and pulled her over when she was walking him. She is calling her granddaughter >20x a day and under a lot of stress. She has not been taking her medicine.  Duration: Chronic Status:exacerbated Anxious mood: yes  Excessive worrying: yes Irritability: yes  Sweating: no Nausea: no Palpitations:no Hyperventilation: no Panic attacks: yes Agoraphobia: no  Obscessions/compulsions: yes Depressed mood: yes Depression screen [facility] 2/9 12/15/2019 11/21/2019 06/27/2019 05/22/2019 02/28/2019  Decreased Interest 0 0 1 1 0  Down, Depressed, Hopeless 0 0 2 0 1  PHQ - 2 Score 0 0 3 1 1   Altered sleeping 2 3 3 3  0  Tired, decreased energy 0 0 3 1 0  Change in appetite 0 2 3 3 1   Feeling bad or failure about yourself  0 0 0 0 0  Trouble concentrating 0 0 0 0 0  Moving slowly or fidgety/restless 2 2 0 0 1  Suicidal thoughts 0 0 0 0 0  PHQ-9 Score 4 7 12 8 3   Difficult doing work/chores Very difficult Somewhat difficult Very difficult Somewhat difficult Somewhat difficult  Some recent data might be hidden   GAD 7 : Generalized Anxiety Score 12/15/2019 02/28/2019  Nervous, Anxious, on Edge 3 1  Control/stop worrying 3 2  Worry too much - different things 3 2  Trouble relaxing 2 1  Restless 2 2  Easily annoyed or irritable 3 1  Afraid - awful might happen 3 2  Total GAD 7 Score 19 11  Anxiety Difficulty Extremely difficult Not difficult at all   Anhedonia: yes Weight changes: no Insomnia: no   Hypersomnia:  no Fatigue/loss of energy: yes Feelings of worthlessness: yes Feelings of guilt: yes Impaired concentration/indecisiveness: yes Suicidal ideations: no  Crying spells: yes Recent Stressors/Life Changes: yes   Relationship problems: no   Family stress: yes     Financial stress: yes    Job stress: no    Recent death/loss: no  Relevant past medical, surgical, family and social history reviewed and updated as indicated. Interim medical history since our last visit reviewed. Allergies and medications reviewed and updated.  Review of Systems  Constitutional: Negative.   Respiratory: Negative.   Cardiovascular: Negative.   Gastrointestinal: Negative.   Musculoskeletal: Negative.   Skin: Negative.   Psychiatric/Behavioral: Positive for agitation, behavioral problems, confusion, decreased concentration, dysphoric mood and sleep disturbance. Negative for hallucinations, self-injury and suicidal ideas. The patient is nervous/anxious. The patient is not hyperactive.     Per HPI unless specifically indicated above     Objective:    BP (!) 155/77 (BP Location: Left Arm, Patient Position: Sitting, Cuff Size: Normal)   Pulse 71   Temp 97.8 F (36.6 C) (Oral)   Ht 5' 1.61" (1.565 m)   Wt 176 lb 3.2 oz (79.9 kg)   LMP  (LMP Unknown)   SpO2 97%  BMI 32.63 kg/m   Wt Readings from Last 3 Encounters:  12/15/19 176 lb 3.2 oz (79.9 kg)  11/21/19 176 lb (79.8 kg)  06/27/19 176 lb (79.8 kg)    Physical Exam Vitals and nursing note reviewed.  Constitutional:      General: She is not in acute distress.    Appearance: Normal appearance. She is not ill-appearing, toxic-appearing or diaphoretic.  HENT:     Head: Normocephalic and atraumatic.     Right Ear: External ear normal.     Left Ear: External ear normal.     Nose: Nose normal.     Mouth/Throat:     Mouth: Mucous membranes are moist.     Pharynx: Oropharynx is clear.  Eyes:     General: No scleral icterus.       Right eye: No  discharge.        Left eye: No discharge.     Extraocular Movements: Extraocular movements intact.     Conjunctiva/sclera: Conjunctivae normal.     Pupils: Pupils are equal, round, and reactive to light.  Cardiovascular:     Rate and Rhythm: Normal rate and regular rhythm.     Pulses: Normal pulses.     Heart sounds: Normal heart sounds. No murmur. No friction rub. No gallop.   Pulmonary:     Effort: Pulmonary effort is normal. No respiratory distress.     Breath sounds: Normal breath sounds. No stridor. No wheezing, rhonchi or rales.  Chest:     Chest wall: No tenderness.  Musculoskeletal:        General: Normal range of motion.     Cervical back: Normal range of motion and neck supple.  Skin:    General: Skin is warm and dry.     Capillary Refill: Capillary refill takes less than 2 seconds.     Coloration: Skin is not jaundiced or pale.     Findings: No bruising, erythema, lesion or rash.  Neurological:     General: No focal deficit present.     Mental Status: She is alert and oriented to person, place, and time. Mental status is at baseline.  Psychiatric:        Mood and Affect: Mood is anxious and depressed. Affect is tearful.        Speech: Speech normal.        Behavior: Behavior normal.        Thought Content: Thought content normal.        Judgment: Judgment normal.     Results for orders placed or performed in visit on 11/21/19  Microscopic Examination   URINE  Result Value Ref Range   WBC, UA 11-30 (A) 0 - 5 /hpf   RBC 3-10 (A) 0 - 2 /hpf   Epithelial Cells (non renal) 0-10 0 - 10 /hpf   Casts Present None seen /lpf   Cast Type Granular casts (A) N/A   Bacteria, UA Moderate (A) None seen/Few  Urine Culture, Reflex   URINE  Result Value Ref Range   Urine Culture, Routine Final report    Organism ID, Bacteria Comment   CBC with Differential/Platelet  Result Value Ref Range   WBC 6.7 3.4 - 10.8 x10E3/uL   RBC 5.09 3.77 - 5.28 x10E6/uL   Hemoglobin 15.8 11.1  - 15.9 g/dL   Hematocrit 79.1 (H) 50.5 - 46.6 %   MCV 96 79 - 97 fL   MCH 31.0 26.6 - 33.0 pg   MCHC 32.4 31.5 -  35.7 g/dL   RDW 12.0 11.7 - 15.4 %   Platelets 245 150 - 450 x10E3/uL   Neutrophils 53 Not Estab. %   Lymphs 27 Not Estab. %   Monocytes 13 Not Estab. %   Eos 6 Not Estab. %   Basos 1 Not Estab. %   Neutrophils Absolute 3.6 1.4 - 7.0 x10E3/uL   Lymphocytes Absolute 1.8 0.7 - 3.1 x10E3/uL   Monocytes Absolute 0.8 0.1 - 0.9 x10E3/uL   EOS (ABSOLUTE) 0.4 0.0 - 0.4 x10E3/uL   Basophils Absolute 0.0 0.0 - 0.2 x10E3/uL   Immature Granulocytes 0 Not Estab. %   Immature Grans (Abs) 0.0 0.0 - 0.1 x10E3/uL  Comprehensive metabolic panel  Result Value Ref Range   Glucose 99 65 - 99 mg/dL   BUN 13 8 - 27 mg/dL   Creatinine, Ser 1.20 (H) 0.57 - 1.00 mg/dL   GFR calc non Af Amer 44 (L) >59 mL/min/1.73   GFR calc Af Amer 51 (L) >59 mL/min/1.73   BUN/Creatinine Ratio 11 (L) 12 - 28   Sodium 143 134 - 144 mmol/L   Potassium 5.0 3.5 - 5.2 mmol/L   Chloride 105 96 - 106 mmol/L   CO2 22 20 - 29 mmol/L   Calcium 9.7 8.7 - 10.3 mg/dL   Total Protein 6.9 6.0 - 8.5 g/dL   Albumin 4.2 3.7 - 4.7 g/dL   Globulin, Total 2.7 1.5 - 4.5 g/dL   Albumin/Globulin Ratio 1.6 1.2 - 2.2   Bilirubin Total 0.5 0.0 - 1.2 mg/dL   Alkaline Phosphatase 86 39 - 117 IU/L   AST 27 0 - 40 IU/L   ALT 18 0 - 32 IU/L  Lipid Panel w/o Chol/HDL Ratio  Result Value Ref Range   Cholesterol, Total 197 100 - 199 mg/dL   Triglycerides 146 0 - 149 mg/dL   HDL 36 (L) >39 mg/dL   VLDL Cholesterol Cal 26 5 - 40 mg/dL   LDL Chol Calc (NIH) 135 (H) 0 - 99 mg/dL  Microalbumin, Urine Waived  Result Value Ref Range   Microalb, Ur Waived 80 (H) 0 - 19 mg/L   Creatinine, Urine Waived 300 10 - 300 mg/dL   Microalb/Creat Ratio 30-300 (H) <30 mg/g  TSH  Result Value Ref Range   TSH 3.370 0.450 - 4.500 uIU/mL  UA/M w/rflx Culture, Routine   Specimen: Urine   URINE  Result Value Ref Range   Specific Gravity, UA 1.020  1.005 - 1.030   pH, UA 6.0 5.0 - 7.5   Color, UA Yellow Yellow   Appearance Ur Cloudy (A) Clear   Leukocytes,UA 2+ (A) Negative   Protein,UA 1+ (A) Negative/Trace   Glucose, UA Negative Negative   Ketones, UA Trace (A) Negative   RBC, UA 2+ (A) Negative   Bilirubin, UA Negative Negative   Urobilinogen, Ur 1.0 0.2 - 1.0 mg/dL   Nitrite, UA Negative Negative   Microscopic Examination See below:    Urinalysis Reflex Comment       Assessment & Plan:   Problem List Items Addressed This Visit      Other   Anxiety    See discussion under depression.      Depression, recurrent ([place])    Really not doing well. In exacerbation. Has not been taking her medicine. Does not want counseling at this time. Strongly encouraged her to take her medicine daily for the next 2 weeks. Will revisit counseling again at that point. Will have LCSW reach out  to see if she can help. Continue to monitor closely.          Follow up plan: Return in about 2 weeks (around 12/29/2019) for wellness/physical.

## 2019-12-24 ENCOUNTER — Ambulatory Visit: Payer: Medicaid Other | Admitting: Licensed Clinical Social Worker

## 2019-12-24 DIAGNOSIS — F419 Anxiety disorder, unspecified: Secondary | ICD-10-CM

## 2019-12-24 DIAGNOSIS — I129 Hypertensive chronic kidney disease with stage 1 through stage 4 chronic kidney disease, or unspecified chronic kidney disease: Secondary | ICD-10-CM

## 2019-12-24 NOTE — Chronic Care Management (AMB) (Signed)
Care Management   Follow Up Note   12/24/2019 Name: Renee Bridges MRN: [id number] DOB: [birth year]  Referred by: Valerie Roys, DO Reason for referral : Care Coordination   Renee Bridges is a 74 y.o. year old female who is a primary care patient of Valerie Roys, DO. The care management team was consulted for assistance with care management and care coordination needs.    Review of patient status, including review of consultants reports, relevant laboratory and other test results, and collaboration with appropriate care team members and the patient's provider was performed as part of comprehensive patient evaluation and provision of chronic care management services.    SDOH (Social Determinants of Health) assessments performed: Yes See Care Plan activities for detailed interventions related to [facility])    Advanced Directives: See Care Plan and Vynca application for related entries.   Goals Addressed    . SW: We need more help and support with Renee Bridges (pt-stated)       Current Barriers:  . Chronic Mental Health needs related to Depression and Grief . Financial constraints related to managing health care expenses . Limited social support . ADL IADL limitations . Mental Health Concerns  . Limited access to caregiver . Cognitive Deficits . Memory Deficits . Inability to perform ADL's independently . Inability to perform IADL's independently . Suicidal Ideation/Homicidal Ideation: No  Clinical Social Work Goal(s):  Marland Kitchen Over the next 120 days, patient will work with SW  bi-monthly  by telephone or in person to reduce or manage symptoms related to depression, grief, anxiety and ongoing stressors . Over the next 120 days, patient will demonstrate improved health management independence as evidenced by implementing positive self-care into her daily routines  Interventions: . Patient interviewed and appropriate assessments performed: brief mental health assessment . Provided family with  personal care service resource education . Granddaughter Renee Bridges reports that patient often isolates where she resides now and she feels that patient would thrive more if she were to relocate near her and her family. Renee Bridges reports living in [place] area. Family is wanting C3 referral for low income housing resources for patient.  . Granddaughter reports that patient was provided a medium sized dog after she lost her own but this dog was too big and could have knocked her over so they went back and got her a smaller dog which is more suitable to patient's needs.  . Patient interviewed and appropriate assessments performed . Provided mental health counseling with regard to grief  . Provided patient with information about available mental health resources within the nearby area . Discussed plans with patient for ongoing care management follow up and provided patient with direct contact information for care management team . Advised patient to contact CCM LCSW directly if case management needs arise . Collaborated with primary care provider re: Patient has new small dog that is helping her cope with the loss of her last one.  . Assisted patient/caregiver with obtaining information about health plan benefits . Provided education and assistance to client regarding Advanced Directives. . A voluntary and extensive discussion about advanced care planning including explanation and discussion of advanced was undertaken with the patient and Renee Bridges (caretaker). Explanation regarding healthcare proxy and living will was reviewed and packet with forms with explanation of how to fill them out was given.   . Provided education to patient/caregiver regarding level of care options. . Provided education to patient/caregiver about Hospice and/or Palliative Care services . Encouraged family  to consider [facility] for grief follow up and therapy/counseling . Solution-Focused Strategies education  provided to family  Patient Self Care Activities:  . Attends all scheduled provider appointments . Strong family or social support  Patient Coping Strengths:  . Family . Hopefulness  Patient Self Care Deficits:  . Lacks social connections . Unable to perform ADLs independently . Unable to perform IADLs independently  Please see past updates related to this goal by clicking on the "Past Updates" button in the selected goal      The care management team will reach out to the patient again over the next quarter.   Dickie La, BSW, MSW, LCSW [facility] Care Management [place] [place]  [facility] [email or web address] Phone: [phone]

## 2019-12-30 ENCOUNTER — Ambulatory Visit: Payer: Medicaid Other | Admitting: General Practice

## 2019-12-30 ENCOUNTER — Telehealth: Payer: Self-pay

## 2019-12-30 ENCOUNTER — Ambulatory Visit (INDEPENDENT_AMBULATORY_CARE_PROVIDER_SITE_OTHER): Payer: Medicare Other | Admitting: General Practice

## 2019-12-30 DIAGNOSIS — F339 Major depressive disorder, recurrent, unspecified: Secondary | ICD-10-CM | POA: Diagnosis not present

## 2019-12-30 DIAGNOSIS — I129 Hypertensive chronic kidney disease with stage 1 through stage 4 chronic kidney disease, or unspecified chronic kidney disease: Secondary | ICD-10-CM | POA: Diagnosis not present

## 2019-12-30 DIAGNOSIS — E782 Mixed hyperlipidemia: Secondary | ICD-10-CM

## 2019-12-30 DIAGNOSIS — F419 Anxiety disorder, unspecified: Secondary | ICD-10-CM

## 2019-12-30 NOTE — Telephone Encounter (Signed)
12/30/19 Spoke with patient's granddaughter Dorcas Mcmurray about housing resources for [facility]. Emailed resources to [email or web address].   Will follow-up in a few days. Olean Ree [phone]

## 2019-12-30 NOTE — Patient Instructions (Signed)
Visit Information  Goals Addressed            This Visit's Progress   . RNCM: Chronic Disease Managment and suppor       Current Barriers:  . Chronic Disease Management support, education, and care coordination needs related to HTN, HLD, CKD Stage 3, and Depression  Clinical Goal(s) related to HTN, HLD, CKD Stage 3, and Depression:  Over the next 120 days, patient will:  . Work with the care management team to address educational, disease management, and care coordination needs  . Begin or continue self health monitoring activities as directed today Measure and record blood pressure 2 times per week . Call provider office for new or worsened signs and symptoms Blood pressure findings outside established parameters, New or worsened symptom related to HTN, HLD, Chronic Kidney Disease 3, and depression . Call care management team with questions or concerns . Verbalize basic understanding of patient centered plan of care established today  Interventions related to HTN, HLD, CKD Stage 3, and Depression:  . Evaluation of current treatment plans and patient's adherence to plan as established by provider. The patient's granddaughter [name] states the patient seems to be doing better since the last pcp visit on April 12th.  The patient has a new dog and this is a smaller dog and working out better than the bigger dog.  . Assessed patient understanding of disease states.  The granddaughter states she understands her chronic conditions but wants to talk to pcp about the patients mood changes and not acting age appropriate. The granddaughter is open to ideas and recommendations from the care team.  . Assessed patient's education and care coordination needs. Follow up on care guide referral that was placed on 12-24-2019 by LCSW.  The granddaughter wants to get the patient closer to her.  She feels this will help her not feel so isolated and the granddaughter can check on her more frequently. Will send a  message to the care guide pool for request to reach out to the granddaughter  . Provided disease specific education to patient. Education on help and resources to help with her depression and anxiety. The patient seems to be doing better since getting a smaller dog. The patient seems to be taking her medication at this time.  [name] with appropriate clinical care team members regarding patient needs.  Pharmacy and LCSW ongoing support and help with managing health and well being of the patient.   Patient Self Care Activities related to HTN, HLD, CKD Stage 3, and Depression:  . Patient is unable to independently self-manage chronic health conditions  Please see past updates related to this goal by clicking on the "Past Updates" button in the selected goal         Patient verbalizes understanding of instructions provided today.   The care management team will reach out to the patient again over the next 60 days.   Alto Denver RN, MSN, CCM Community Care Coordinator [place]  [facility] Mobile: [phone]

## 2019-12-30 NOTE — Chronic Care Management (AMB) (Signed)
Chronic Care Management   Follow Up Note   12/30/2019 Name: Renee Bridges MRN: [id number] DOB: [birth year]  Referred by: Renee Carrow, DO Reason for referral : Chronic Care Management (Follow up: HTN/HLD/CKD3/Depression/Anxiety)   Renee Bridges is a 74 y.o. year old female who is a primary care patient of Renee Carrow, DO. The CCM team was consulted for assistance with chronic disease management and care coordination needs.    Review of patient status, including review of consultants reports, relevant laboratory and other test results, and collaboration with appropriate care team members and the patient's provider was performed as part of comprehensive patient evaluation and provision of chronic care management services.    SDOH (Social Determinants of Health) assessments performed: Yes See Care Plan activities for detailed interventions related to Renee Bridges)     Outpatient Encounter Medications as of 12/30/2019  Medication Sig   QUEtiapine (SEROQUEL XR) 50 MG TB24 24 hr tablet Take 1 tablet (50 mg total) by mouth at bedtime.   No facility-administered encounter medications on file as of 12/30/2019.     Objective:  BP Readings from Last 3 Encounters:  12/15/19 (!) 155/77  11/21/19 133/83  06/27/19 116/75    Goals Addressed            This Visit's Progress    RNCM: Chronic Disease Managment and suppor       Current Barriers:   Chronic Disease Management support, education, and care coordination needs related to HTN, HLD, CKD Stage 3, and Depression  Clinical Goal(s) related to HTN, HLD, CKD Stage 3, and Depression:  Over the next 120 days, patient will:   Work with the care management team to address educational, disease management, and care coordination needs   Begin or continue self health monitoring activities as directed today Measure and record blood pressure 2 times per week  Call provider office for new or worsened signs and symptoms Blood pressure findings  outside established parameters, New or worsened symptom related to HTN, HLD, Chronic Kidney Disease 3, and depression  Call care management team with questions or concerns  Verbalize basic understanding of patient centered plan of care established today  Interventions related to HTN, HLD, CKD Stage 3, and Depression:   Evaluation of current treatment plans and patient's adherence to plan as established by provider. The patient's granddaughter Renee Bridges states the patient seems to be doing better since the last pcp visit on April 12th.  The patient has a new dog and this is a smaller dog and working out better than the bigger dog.   Assessed patient understanding of disease states.  The granddaughter states she understands her chronic conditions but wants to talk to pcp about the patients mood changes and not acting age appropriate. The granddaughter is open to ideas and recommendations from the care team.   Assessed patient's education and care coordination needs. Follow up on care guide referral that was placed on 12-24-2019 by LCSW.  The granddaughter wants to get the patient closer to her.  She feels this will help her not feel so isolated and the granddaughter can check on her more frequently. Will send a message to the care guide pool for request to reach out to the granddaughter   Provided disease specific education to patient. Education on help and resources to help with her depression and anxiety. The patient seems to be doing better since getting a smaller dog. The patient seems to be taking her medication at this time.  Collaborated with appropriate clinical care team members regarding patient needs.  Pharmacy and LCSW ongoing support and help with managing health and well being of the patient.   Patient Self Care Activities related to HTN, HLD, CKD Stage 3, and Depression:   Patient is unable to independently self-manage chronic health conditions  Please see past updates related to  this goal by clicking on the "Past Updates" button in the selected goal          Plan:   The care management team will reach out to the patient again over the next 60 days.    [name] RN, MSN, [facility] Mobile: [phone]

## 2020-01-01 ENCOUNTER — Telehealth: Payer: Self-pay

## 2020-01-01 NOTE — Telephone Encounter (Signed)
Copied from CRM [id number]. Topic: Referral - Status >> Jan 01, 2020  1:01 PM Ricarda Frame D wrote: 01/01/20 Left message on voicemail for patient's granddaughter to return my call  regarding housing resources that were emailed on 12/30/19.  Olean Ree [phone]

## 2020-01-02 ENCOUNTER — Telehealth: Payer: Self-pay

## 2020-01-02 NOTE — Telephone Encounter (Signed)
Copied from CRM [id number]. Topic: Referral - Status >> Jan 02, 2020 12:28 PM Ricarda Frame D wrote: 01/02/20 Spoke with patient's granddaughter Frances Furbish about housing resources.  She has contacted resources in [facility] and is waiting for a response.  I also gave her information for [employer], [employer] and [employer].  [name] has my contact information should she need further assistance.  No other resources are needed for this patient at this time.  Closing referral. Olean Ree [phone]

## 2020-01-12 ENCOUNTER — Encounter: Payer: Self-pay | Admitting: Family Medicine

## 2020-01-12 ENCOUNTER — Ambulatory Visit (INDEPENDENT_AMBULATORY_CARE_PROVIDER_SITE_OTHER): Payer: Medicare Other

## 2020-01-12 ENCOUNTER — Other Ambulatory Visit: Payer: Self-pay

## 2020-01-12 ENCOUNTER — Ambulatory Visit (INDEPENDENT_AMBULATORY_CARE_PROVIDER_SITE_OTHER): Payer: Medicare Other | Admitting: Family Medicine

## 2020-01-12 VITALS — BP 144/82 | HR 74 | Temp 97.7°F | Ht 67.5 in | Wt 175.0 lb

## 2020-01-12 DIAGNOSIS — I129 Hypertensive chronic kidney disease with stage 1 through stage 4 chronic kidney disease, or unspecified chronic kidney disease: Secondary | ICD-10-CM

## 2020-01-12 DIAGNOSIS — E782 Mixed hyperlipidemia: Secondary | ICD-10-CM

## 2020-01-12 DIAGNOSIS — Z0000 Encounter for general adult medical examination without abnormal findings: Secondary | ICD-10-CM | POA: Diagnosis not present

## 2020-01-12 DIAGNOSIS — F419 Anxiety disorder, unspecified: Secondary | ICD-10-CM

## 2020-01-12 DIAGNOSIS — F339 Major depressive disorder, recurrent, unspecified: Secondary | ICD-10-CM

## 2020-01-12 DIAGNOSIS — R8281 Pyuria: Secondary | ICD-10-CM | POA: Diagnosis not present

## 2020-01-12 DIAGNOSIS — Z13820 Encounter for screening for osteoporosis: Secondary | ICD-10-CM

## 2020-01-12 DIAGNOSIS — N3941 Urge incontinence: Secondary | ICD-10-CM

## 2020-01-12 DIAGNOSIS — N183 Chronic kidney disease, stage 3 unspecified: Secondary | ICD-10-CM | POA: Diagnosis not present

## 2020-01-12 LAB — URINALYSIS, ROUTINE W REFLEX MICROSCOPIC
Bilirubin, UA: NEGATIVE
Glucose, UA: NEGATIVE
Ketones, UA: NEGATIVE
Nitrite, UA: NEGATIVE
Protein,UA: NEGATIVE
Specific Gravity, UA: 1.015 (ref 1.005–1.030)
Urobilinogen, Ur: 0.2 mg/dL (ref 0.2–1.0)
pH, UA: 6.5 (ref 5.0–7.5)

## 2020-01-12 LAB — MICROSCOPIC EXAMINATION: WBC, UA: >30 /hpf — AB (ref 0–5)

## 2020-01-12 LAB — MICROALBUMIN, URINE WAIVED
Creatinine, Urine Waived: 50 mg/dL (ref 10–300)
Microalb, Ur Waived: 30 mg/L — ABNORMAL HIGH (ref 0–19)

## 2020-01-12 MED ORDER — CETIRIZINE HCL 10 MG PO TABS: 10.0000 mg | ORAL_TABLET | Freq: Every day | ORAL | 11 refills | Status: DC

## 2020-01-12 MED ORDER — QUETIAPINE FUMARATE ER 50 MG PO TB24: 50.0000 mg | ORAL_TABLET | Freq: Every day | ORAL | 1 refills | Status: DC

## 2020-01-12 NOTE — Assessment & Plan Note (Signed)
Rechecking labs today. Has been doing well. Continue diet and continue to monitor. Call with any concerns.

## 2020-01-12 NOTE — Assessment & Plan Note (Signed)
Checking labs today. Await results.  

## 2020-01-12 NOTE — Patient Instructions (Addendum)
Call to schedule your bone density: [facility]  Address: [address], [place], [place] [address]  Phone: [phone]    Health Maintenance After Age 76 After age 76, you are at a higher risk for certain long-term diseases and infections as well as injuries from falls. Falls are a major cause of broken bones and head injuries in people who are older than age 76. Getting regular preventive care can help to keep you healthy and well. Preventive care includes getting regular testing and making lifestyle changes as recommended by your health care provider. Talk with your health care provider about:  Which screenings and tests you should have. A screening is a test that checks for a disease when you have no symptoms.  A diet and exercise plan that is right for you. What should I know about screenings and tests to prevent falls? Screening and testing are the best ways to find a health problem early. Early diagnosis and treatment give you the best chance of managing medical conditions that are common after age 76. Certain conditions and lifestyle choices may make you more likely to have a fall. Your health care provider may recommend:  Regular vision checks. Poor vision and conditions such as cataracts can make you more likely to have a fall. If you wear glasses, make sure to get your prescription updated if your vision changes.  Medicine review. Work with your health care provider to regularly review all of the medicines you are taking, including over-the-counter medicines. Ask your health care provider about any side effects that may make you more likely to have a fall. Tell your health care provider if any medicines that you take make you feel dizzy or sleepy.  Osteoporosis screening. Osteoporosis is a condition that causes the bones to get weaker. This can make the bones weak and cause them to break more easily.  Blood pressure screening. Blood pressure  changes and medicines to control blood pressure can make you feel dizzy.  Strength and balance checks. Your health care provider may recommend certain tests to check your strength and balance while standing, walking, or changing positions.  Foot health exam. Foot pain and numbness, as well as not wearing proper footwear, can make you more likely to have a fall.  Depression screening. You may be more likely to have a fall if you have a fear of falling, feel emotionally low, or feel unable to do activities that you used to do.  Alcohol use screening. Using too much alcohol can affect your balance and may make you more likely to have a fall. What actions can I take to lower my risk of falls? General instructions  Talk with your health care provider about your risks for falling. Tell your health care provider if: ? You fall. Be sure to tell your health care provider about all falls, even ones that seem minor. ? You feel dizzy, sleepy, or off-balance.  Take over-the-counter and prescription medicines only as told by your health care provider. These include any supplements.  Eat a healthy diet and maintain a healthy weight. A healthy diet includes low-fat dairy products, low-fat (lean) meats, and fiber from whole grains, beans, and lots of fruits and vegetables. Home safety  Remove any tripping hazards, such as rugs, cords, and clutter.  Install safety equipment such as grab bars in bathrooms and safety rails on stairs.  Keep rooms and walkways well-lit. Activity   Follow a regular exercise program to   stay fit. This will help you maintain your balance. Ask your health care provider what types of exercise are appropriate for you.  If you need a cane or walker, use it as recommended by your health care provider.  Wear supportive shoes that have nonskid soles. Lifestyle  Do not drink alcohol if your health care provider tells you not to drink.  If you drink alcohol, limit how much you  have: ? 0-1 drink a day for women. ? 0-2 drinks a day for men.  Be aware of how much alcohol is in your drink. In the [place] one drink equals one typical bottle of beer (12 oz), one-half glass of wine (5 oz), or one shot of hard liquor (1 oz).  Do not use any products that contain nicotine or tobacco, such as cigarettes and e-cigarettes. If you need help quitting, ask your health care provider. Summary  Having a healthy lifestyle and getting preventive care can help to protect your health and wellness after age 76.  Screening and testing are the best way to find a health problem early and help you avoid having a fall. Early diagnosis and treatment give you the best chance for managing medical conditions that are more common for people who are older than age 76.  Falls are a major cause of broken bones and head injuries in people who are older than age 76. Take precautions to prevent a fall at home.  Work with your health care provider to learn what changes you can make to improve your health and wellness and to prevent falls. This information is not intended to replace advice given to you by your health care provider. Make sure you discuss any questions you have with your health care provider. Document Revised: 12/12/2018 Document Reviewed: 07/04/2017 Elsevier Patient Education  2020 Elsevier Inc.  

## 2020-01-12 NOTE — Assessment & Plan Note (Signed)
Running slightly high today. Has been doing well. Continue DASH diet and continue to monitor. Call with any concerns.

## 2020-01-12 NOTE — Assessment & Plan Note (Signed)
Doing better on her seroquel. Discussed the importance of her taking her medicine daily. Declined referral to counseling or psychiatry. Continue to monitor.  

## 2020-01-12 NOTE — Progress Notes (Signed)
Subjective:   Renee Bridges is a 74 y.o. female who presents for Medicare Annual (Subsequent) preventive examination.  Review of Systems:   Cardiac Risk Factors include: advanced age (>17men, >72 women);dyslipidemia;hypertension     Objective:     Vitals: LMP  (LMP Unknown)   There is no height or weight on file to calculate BMI.  Advanced Directives 01/12/2020 08/25/2017 05/04/2017 01/10/2017 05/13/2015 05/06/2015  Does Patient Have a Medical Advance Directive? No No No No No No  Would patient like information on creating a medical advance directive? Yes (MAU/Ambulatory/Procedural Areas - Information given) No - Patient declined Yes (MAU/Ambulatory/Procedural Areas - Information given) No - Patient declined No - patient declined information No - patient declined information    Tobacco Social History   Tobacco Use  Smoking Status Never Smoker  Smokeless Tobacco Never Used     Counseling given: Not Answered   Clinical Intake:  Pre-visit preparation completed: Yes  Pain : No/denies pain     Nutritional Status: BMI 25 -29 Overweight Nutritional Risks: None Diabetes: No  How often do you need to have someone help you when you read instructions, pamphlets, or other written materials from your doctor or pharmacy?: 1 - Never  Interpreter Needed?: No  Information entered by :: Isolde Skaff,LPN  Past Medical History:  Diagnosis Date  . Anxiety   . Arthritis   . Asthma   . Complication of surgical procedure 03/08/2018  . Constipation 07/17/2016  . Depression   . Headache   . History of hiatal hernia   . Hypertension   . Immobility 09/25/2016  . Kidney cyst, acquired   . Seizures (HCC)    hx. as child  . Shortness of breath dyspnea    with excertion   Past Surgical History:  Procedure Laterality Date  . ABDOMINAL HYSTERECTOMY    . BACK SURGERY    . colonoscopy a year ago    . LEG SURGERY Left 03/2015  . mass removed from back - 20 years ago    . SPINAL CORD  STIMULATOR INSERTION N/A 01/17/2017   Procedure: LUMBAR SPINAL CORD STIMULATOR INSERTION;  Surgeon: Venita Lick, MD;  Location: [facility] OR;  Service: Orthopedics;  Laterality: N/A;  Requests 3 hrs  . TONSILLECTOMY    . TOTAL KNEE REVISION Left 05/13/2015   Procedure: REVISION LEFT  KNEE ARTHROPLASTY, REVISION PATELLA POLY EXCHANGE;  Surgeon: Samson Frederic, MD;  Location: [facility];  Service: Orthopedics;  Laterality: Left;   Family History  Problem Relation Age of Onset  . Cancer Mother   . Cancer Father   . Diabetes Brother   . Cancer Brother   . Cancer Brother    Social History   Socioeconomic History  . Marital status: Divorced    Spouse name: Not on file  . Number of children: Not on file  . Years of education: Not on file  . Highest education level: Not on file  Occupational History  . Not on file  Tobacco Use  . Smoking status: Never Smoker  . Smokeless tobacco: Never Used  Substance and Sexual Activity  . Alcohol use: No    Alcohol/week: 0.0 standard drinks  . Drug use: No  . Sexual activity: Never  Other Topics Concern  . Not on file  Social History Narrative  . Not on file   Social Determinants of Health   Financial Resource Strain:   . Difficulty of Paying Living Expenses:   Food Insecurity: No Food Insecurity  .  Worried About Programme researcher, broadcasting/film/video in the Last Year: Never true  . Ran Out of Food in the Last Year: Never true  Transportation Needs: Unmet Transportation Needs  . Lack of Transportation (Medical): Yes  . Lack of Transportation (Non-Medical): Yes  Physical Activity:   . Days of Exercise per Week:   . Minutes of Exercise per Session:   Stress: Stress Concern Present  . Feeling of Stress : To some extent  Social Connections:   . Frequency of Communication with Friends and Family:   . Frequency of Social Gatherings with Friends and Family:   . Attends Religious Services:   . Active Member of Clubs or Organizations:   . Attends Banker  Meetings:   Marland Kitchen Marital Status:     Outpatient Encounter Medications as of 01/12/2020  Medication Sig  . cetirizine (ZYRTEC) 10 MG tablet Take 1 tablet (10 mg total) by mouth daily.  . QUEtiapine (SEROQUEL XR) 50 MG TB24 24 hr tablet Take 1 tablet (50 mg total) by mouth at bedtime.   No facility-administered encounter medications on file as of 01/12/2020.    Activities of Daily Living In your present state of health, do you have any difficulty performing the following activities: 01/12/2020 01/12/2020  Hearing? Y Y  Comment hearing aids -  Vision? N N  Comment eyeglasses, [employer] -  Difficulty concentrating or making decisions? N N  Walking or climbing stairs? N N  Dressing or bathing? N N  Doing errands, shopping? Y N  Comment family drives -  Quarry manager and eating ? N -  Using the Toilet? N -  In the past six months, have you accidently leaked urine? N -  Do you have problems with loss of bowel control? N -  Managing your Medications? N -  Managing your Finances? N -  Housekeeping or managing your Housekeeping? N -  Some recent data might be hidden    Patient Care Team: [name], DO as PCP - General (Family Medicine) Samson Frederic, MD as Consulting Physician (Orthopedic Surgery) Marlowe Sax, RN as Case Manager (General Practice) Lourena Simmonds, [facility] (Pharmacist) Gustavus Bryant, LCSW as Social Worker (Licensed Clinical Social Worker)    Assessment:   This is a routine wellness examination for [place].  Exercise Activities and Dietary recommendations Current Exercise Habits: Home exercise routine, Type of exercise: walking, Time (Minutes): 20, Frequency (Times/Week): 3, Weekly Exercise (Minutes/Week): 60, Intensity: Mild, Exercise limited by: None identified  Goals Addressed   None     Fall Risk: Fall Risk  01/12/2020 12/24/2018 09/14/2017 05/04/2017 03/21/2017  Falls in the past year? 0 0 No No No  Number falls in past yr: 0 0 - - -  Injury with Fall?  0 0 - - -    FALL RISK PREVENTION PERTAINING TO THE HOME:  Any stairs in or around the home? No  If so, are there any without handrails? Yes   Home free of loose throw rugs in walkways, pet beds, electrical cords, etc? Yes  Adequate lighting in your home to reduce risk of falls? Yes   ASSISTIVE DEVICES UTILIZED TO PREVENT FALLS:  Life alert? No  Use of a cane, walker or w/c? No  Grab bars in the bathroom? Yes  Shower chair or bench in shower? No  Elevated toilet seat or a handicapped toilet? Yes   DME ORDERS:  DME order needed?  No   TIMED UP AND GO:  Was the  test performed? Yes .  Length of time to ambulate 10 feet: 9 sec.   GAIT:  Appearance of gait: Gait steady and fast without the use of an assistive device.  Education: Fall risk prevention has been discussed.  Intervention(s) required? No   DME/home health order needed?  No    Depression Screen PHQ 2/9 Scores 12/15/2019 11/21/2019 06/27/2019 05/22/2019  PHQ - 2 Score 0 0 3 1  PHQ- 9 Score 4 7 12 8      Cognitive Function MMSE - Mini Mental State Exam 09/30/2018 09/12/2018 12/28/2017  Orientation to time 5 5 5   Orientation to Place 5 4 5   Registration 3 3 3   Attention/ Calculation 4 2 3   Recall 3 3 2   Language- name 2 objects 2 2 2   Language- repeat 1 0 0  Language- follow 3 step command 2 1 3   Language- read & follow direction 1 0 1  Write a sentence 1 0 1  Copy design 0 0 0  Total score 27 20 25      6CIT Screen 05/04/2017  What Year? 0 points  What month? 0 points  What time? 0 points  Count back from 20 0 points  Months in reverse 0 points  Repeat phrase 0 points  Total Score 0    Immunization History  Administered Date(s) Administered  . Fluad Quad(high Dose 65+) 05/22/2019  . Influenza, High Dose Seasonal PF 07/17/2016, 09/14/2017, 06/10/2018  . Influenza,inj,Quad PF,6+ Mos 05/15/2015  . PFIZER SARS-COV-2 Vaccination 11/13/2019, 12/11/2019  . Pneumococcal Conjugate-13 12/22/2013  .  Pneumococcal Polysaccharide-23 12/22/2008, 12/27/2015  . Zoster 12/22/2013    Qualifies for Shingles Vaccine? Yes  Zostavax completed n/a. Due for Shingrix. Education has been provided regarding the importance of this vaccine. Pt has been advised to call insurance company to determine out of pocket expense. Advised may also receive vaccine at local pharmacy or Health Dept. Verbalized acceptance and understanding.  Tdap: Discussed need for TD/TDAP vaccine, patient verbalized understanding that this is not covered as a preventative with there insurance and to call the office if she  develops any new skin injuries, ie: cuts, scrapes, bug bites, or open wounds.  Flu Vaccine: up to date   Pneumococcal Vaccine: up to date   Covid-19 Vaccine:Completed vaccines  Screening Tests Health Maintenance  Topic Date Due  . TETANUS/TDAP  02/12/2020 (Originally 03/05/1965)  . INFLUENZA VACCINE  04/04/2020  . COLONOSCOPY  06/24/2023  . DEXA SCAN  Completed  . COVID-19 Vaccine  Completed  . Hepatitis C Screening  Completed  . PNA vac Low Risk Adult  Completed    Cancer Screenings:  Colorectal Screening: Completed 06/2013. Repeat every 10 years  Mammogram: no longer required   Bone Density: up to date   Lung Cancer Screening: (Low Dose CT Chest recommended if Age 18-80 years, 30 pack-year currently smoking OR have quit w/in 15years.) does not qualify.     Additional Screening:  Hepatitis C Screening: does qualify; Completed 2017  Vision Screening: Recommended annual ophthalmology exams for early detection of glaucoma and other disorders of the eye. Is the patient up to date with their annual eye exam?  No   Dental Screening: Recommended annual dental exams for proper oral hygiene  Community Resource Referral:  CRR required this visit?  No       Plan:  I have personally reviewed and addressed the Medicare Annual Wellness questionnaire and have noted the following in the patient's chart:    A. Medical and social  history B. Use of alcohol, tobacco or illicit drugs  C. Current medications and supplements D. Functional ability and status E.  Nutritional status F.  Physical activity G. Advance directives H. List of other physicians I.  Hospitalizations, surgeries, and ER visits in previous 12 months J.  Vitals K. Screenings such as hearing and vision if needed, cognitive and depression L. Referrals and appointments   In addition, I have reviewed and discussed with patient certain preventive protocols, quality metrics, and best practice recommendations. A written personalized care plan for preventive services as well as general preventive health recommendations were provided to patient.  Signed,    Collene Schlichter, LPN  [id number] Nurse Health Advisor   Nurse Notes: none

## 2020-01-12 NOTE — Assessment & Plan Note (Signed)
Doing better on her seroquel. Discussed the importance of her taking her medicine daily. Declined referral to counseling or psychiatry. Continue to monitor.

## 2020-01-12 NOTE — Assessment & Plan Note (Signed)
Rechecking labs today. Await results. Call with any concerns.  

## 2020-01-12 NOTE — Progress Notes (Signed)
BP (!) 144/82 (BP Location: Left Arm, Patient Position: Sitting, Cuff Size: Normal)   Pulse 74   Temp 97.7 F (36.5 C) (Oral)   Ht 5' 7.5" (1.715 m)   Wt 175 lb (79.4 kg)   LMP  (LMP Unknown)   SpO2 97%   BMI 27.00 kg/m    Subjective:    Patient ID: Renee Bridges, female    DOB: [birth year], 74 y.o.   MRN: [id number]  HPI: Renee Bridges is a 74 y.o. female presenting on 01/12/2020 for comprehensive medical examination. Current medical complaints include:  HYPERTENSION / HYPERLIPIDEMIA Satisfied with current treatment? yes Duration of hypertension: chronic BP monitoring frequency: not checking BP medication side effects: no Past BP meds: none Duration of hyperlipidemia: chronic Cholesterol medication side effects: not on anything Past cholesterol medications: none Medication compliance: poor compliance Aspirin: no Recent stressors: yes Recurrent headaches: no Visual changes: no Palpitations: no Dyspnea: no Chest pain: no Lower extremity edema: no Dizzy/lightheaded: no  ANXIETY/DEPRESSION- has been waking up at 2AM with a headache. Doing much better on her seroquel Duration:better Anxious mood: yes  Excessive worrying: yes Irritability: yes  Sweating: no Nausea: no Palpitations:no Hyperventilation: no Panic attacks: yes Agoraphobia: no  Obscessions/compulsions: no Depressed mood: yes Depression screen [facility] 2/9 12/15/2019 11/21/2019 06/27/2019 05/22/2019 02/28/2019  Decreased Interest 0 0 1 1 0  Down, Depressed, Hopeless 0 0 2 0 1  PHQ - 2 Score 0 0 3 1 1   Altered sleeping 2 3 3 3  0  Tired, decreased energy 0 0 3 1 0  Change in appetite 0 2 3 3 1   Feeling bad or failure about yourself  0 0 0 0 0  Trouble concentrating 0 0 0 0 0  Moving slowly or fidgety/restless 2 2 0 0 1  Suicidal thoughts 0 0 0 0 0  PHQ-9 Score 4 7 12 8 3   Difficult doing work/chores Very difficult Somewhat difficult Very difficult Somewhat difficult Somewhat difficult  Some recent data  might be hidden   Anhedonia: no Weight changes: no Insomnia: no   Hypersomnia: no Fatigue/loss of energy: no Feelings of worthlessness: yes Feelings of guilt: yes Impaired concentration/indecisiveness: yes Suicidal ideations: no  Crying spells: no Recent Stressors/Life Changes: yes   Relationship problems: no   Family stress: yes     Financial stress: no    Job stress: no    Recent death/loss: yes  She currently lives with: alone Menopausal Symptoms: no  Depression Screen done today and results listed below:  Depression screen [facility] 2/9 12/15/2019 11/21/2019 06/27/2019 05/22/2019 02/28/2019  Decreased Interest 0 0 1 1 0  Down, Depressed, Hopeless 0 0 2 0 1  PHQ - 2 Score 0 0 3 1 1   Altered sleeping 2 3 3 3  0  Tired, decreased energy 0 0 3 1 0  Change in appetite 0 2 3 3 1   Feeling bad or failure about yourself  0 0 0 0 0  Trouble concentrating 0 0 0 0 0  Moving slowly or fidgety/restless 2 2 0 0 1  Suicidal thoughts 0 0 0 0 0  PHQ-9 Score 4 7 12 8 3   Difficult doing work/chores Very difficult Somewhat difficult Very difficult Somewhat difficult Somewhat difficult  Some recent data might be hidden    Past Medical History:  Past Medical History:  Diagnosis Date  . Anxiety   . Arthritis   . Asthma   . Complication of surgical procedure 03/08/2018  . Constipation 07/17/2016  .  Depression   . Headache   . History of hiatal hernia   . Hypertension   . Immobility 09/25/2016  . Kidney cyst, acquired   . Seizures ([place])    hx. as child  . Shortness of breath dyspnea    with excertion    Surgical History:  Past Surgical History:  Procedure Laterality Date  . ABDOMINAL HYSTERECTOMY    . BACK SURGERY    . colonoscopy a year ago    . LEG SURGERY Left 03/2015  . mass removed from back - 20 years ago    . SPINAL CORD STIMULATOR INSERTION N/A 01/17/2017   Procedure: LUMBAR SPINAL CORD STIMULATOR INSERTION;  Surgeon: Melina Schools, MD;  Location: [facility];  Service: Orthopedics;   Laterality: N/A;  Requests 3 hrs  . TONSILLECTOMY    . TOTAL KNEE REVISION Left 05/13/2015   Procedure: REVISION LEFT  KNEE ARTHROPLASTY, REVISION PATELLA POLY EXCHANGE;  Surgeon: Rod Can, MD;  Location: [facility];  Service: Orthopedics;  Laterality: Left;    Medications:  No current outpatient medications on file prior to visit.   No current facility-administered medications on file prior to visit.    Allergies:  Allergies  Allergen Reactions  . Bactrim [Sulfamethoxazole-Trimethoprim] Other (See Comments)    Sweating, feeling like throat is closing up  . Codeine Rash    Social History:  Social History   Socioeconomic History  . Marital status: Divorced    Spouse name: Not on file  . Number of children: Not on file  . Years of education: Not on file  . Highest education level: Not on file  Occupational History  . Not on file  Tobacco Use  . Smoking status: Never Smoker  . Smokeless tobacco: Never Used  Substance and Sexual Activity  . Alcohol use: No    Alcohol/week: 0.0 standard drinks  . Drug use: No  . Sexual activity: Never  Other Topics Concern  . Not on file  Social History Narrative  . Not on file   Social Determinants of Health   Financial Resource Strain:   . Difficulty of Paying Living Expenses:   Food Insecurity: No Food Insecurity  . Worried About Charity fundraiser in the Last Year: Never true  . Ran Out of Food in the Last Year: Never true  Transportation Needs: Unmet Transportation Needs  . Lack of Transportation (Medical): Yes  . Lack of Transportation (Non-Medical): Yes  Physical Activity:   . Days of Exercise per Week:   . Minutes of Exercise per Session:   Stress: Stress Concern Present  . Feeling of Stress : To some extent  Social Connections:   . Frequency of Communication with Friends and Family:   . Frequency of Social Gatherings with Friends and Family:   . Attends Religious Services:   . Active Member of Clubs or  Organizations:   . Attends Archivist Meetings:   Marland Kitchen Marital Status:   Intimate Partner Violence:   . Fear of Current or Ex-Partner:   . Emotionally Abused:   Marland Kitchen Physically Abused:   . Sexually Abused:    Social History   Tobacco Use  Smoking Status Never Smoker  Smokeless Tobacco Never Used   Social History   Substance and Sexual Activity  Alcohol Use No  . Alcohol/week: 0.0 standard drinks    Family History:  Family History  Problem Relation Age of Onset  . Cancer Mother   . Cancer Father   . Diabetes Brother   .  Cancer Brother   . Cancer Brother     Past medical history, surgical history, medications, allergies, family history and social history reviewed with patient today and changes made to appropriate areas of the chart.   Review of Systems  Constitutional: Positive for diaphoresis. Negative for fever, malaise/fatigue and weight loss.  HENT: Positive for congestion and ear discharge. Negative for ear pain, hearing loss, nosebleeds, sinus pain, sore throat and tinnitus.   Eyes: Negative.   Respiratory: Negative.  Negative for stridor.   Cardiovascular: Positive for palpitations. Negative for chest pain, orthopnea, claudication, leg swelling and PND.  Gastrointestinal: Negative.   Genitourinary: Positive for dysuria (x 1 week- resolves with increased fluids) and frequency. Negative for flank pain, hematuria and urgency.  Musculoskeletal: Positive for joint pain and myalgias. Negative for back pain, falls and neck pain.  Skin: Negative.   Neurological: Positive for headaches. Negative for dizziness, tingling, tremors, sensory change, speech change, focal weakness, seizures, loss of consciousness and weakness.  Endo/Heme/Allergies: Positive for environmental allergies. Negative for polydipsia. Bruises/bleeds easily.  Psychiatric/Behavioral: Positive for depression. Negative for hallucinations, memory loss, substance abuse and suicidal ideas. The patient is  nervous/anxious. The patient does not have insomnia.     All other ROS negative except what is listed above and in the HPI.      Objective:    BP (!) 144/82 (BP Location: Left Arm, Patient Position: Sitting, Cuff Size: Normal)   Pulse 74   Temp 97.7 F (36.5 C) (Oral)   Ht 5' 7.5" (1.715 m)   Wt 175 lb (79.4 kg)   LMP  (LMP Unknown)   SpO2 97%   BMI 27.00 kg/m   Wt Readings from Last 3 Encounters:  01/12/20 175 lb (79.4 kg)  01/12/20 175 lb (79.4 kg)  12/15/19 176 lb 3.2 oz (79.9 kg)    Physical Exam Vitals and nursing note reviewed.  Constitutional:      General: She is not in acute distress.    Appearance: Normal appearance. She is not ill-appearing, toxic-appearing or diaphoretic.  HENT:     Head: Normocephalic and atraumatic.     Right Ear: Tympanic membrane, ear canal and external ear normal. There is no impacted cerumen.     Left Ear: Tympanic membrane, ear canal and external ear normal. There is no impacted cerumen.     Nose: Nose normal. No congestion or rhinorrhea.     Mouth/Throat:     Mouth: Mucous membranes are moist.     Pharynx: Oropharynx is clear. No oropharyngeal exudate or posterior oropharyngeal erythema.  Eyes:     General: No scleral icterus.       Right eye: No discharge.        Left eye: No discharge.     Extraocular Movements: Extraocular movements intact.     Conjunctiva/sclera: Conjunctivae normal.     Pupils: Pupils are equal, round, and reactive to light.  Neck:     Vascular: No carotid bruit.  Cardiovascular:     Rate and Rhythm: Normal rate and regular rhythm.     Pulses: Normal pulses.     Heart sounds: No murmur. No friction rub. No gallop.   Pulmonary:     Effort: Pulmonary effort is normal. No respiratory distress.     Breath sounds: Normal breath sounds. No stridor. No wheezing, rhonchi or rales.  Chest:     Chest wall: No tenderness.  Abdominal:     General: Abdomen is flat. Bowel sounds are normal. There is  no distension.      Palpations: Abdomen is soft. There is no mass.     Tenderness: There is no abdominal tenderness. There is no right CVA tenderness, left CVA tenderness, guarding or rebound.     Hernia: No hernia is present.  Genitourinary:    Comments: Genital exam deferred with shared decision making Musculoskeletal:        General: No swelling, tenderness, deformity or signs of injury.     Cervical back: Normal range of motion and neck supple. No rigidity. No muscular tenderness.     Right lower leg: No edema.     Left lower leg: No edema.  Lymphadenopathy:     Cervical: No cervical adenopathy.  Skin:    General: Skin is warm and dry.     Capillary Refill: Capillary refill takes less than 2 seconds.     Coloration: Skin is not jaundiced or pale.     Findings: No bruising, erythema, lesion or rash.  Neurological:     General: No focal deficit present.     Mental Status: She is alert and oriented to person, place, and time.     Cranial Nerves: No cranial nerve deficit.     Sensory: No sensory deficit.     Motor: No weakness.     Coordination: Coordination normal.     Gait: Gait normal.     Deep Tendon Reflexes: Reflexes normal.  Psychiatric:        Mood and Affect: Mood normal.        Behavior: Behavior normal.        Thought Content: Thought content normal.        Judgment: Judgment normal.     Results for orders placed or performed in visit on 11/21/19  Microscopic Examination   URINE  Result Value Ref Range   WBC, UA 11-30 (A) 0 - 5 /hpf   RBC 3-10 (A) 0 - 2 /hpf   Epithelial Cells (non renal) 0-10 0 - 10 /hpf   Casts Present None seen /lpf   Cast Type Granular casts (A) N/A   Bacteria, UA Moderate (A) None seen/Few  Urine Culture, Reflex   URINE  Result Value Ref Range   Urine Culture, Routine Final report    Organism ID, Bacteria Comment   CBC with Differential/Platelet  Result Value Ref Range   WBC 6.7 3.4 - 10.8 x10E3/uL   RBC 5.09 3.77 - 5.28 x10E6/uL   Hemoglobin 15.8  11.1 - 15.9 g/dL   Hematocrit 41.3 (H) 24.4 - 46.6 %   MCV 96 79 - 97 fL   MCH 31.0 26.6 - 33.0 pg   MCHC 32.4 31.5 - 35.7 g/dL   RDW 01.0 27.2 - 53.6 %   Platelets 245 150 - 450 x10E3/uL   Neutrophils 53 Not Estab. %   Lymphs 27 Not Estab. %   Monocytes 13 Not Estab. %   Eos 6 Not Estab. %   Basos 1 Not Estab. %   Neutrophils Absolute 3.6 1.4 - 7.0 x10E3/uL   Lymphocytes Absolute 1.8 0.7 - 3.1 x10E3/uL   Monocytes Absolute 0.8 0.1 - 0.9 x10E3/uL   EOS (ABSOLUTE) 0.4 0.0 - 0.4 x10E3/uL   Basophils Absolute 0.0 0.0 - 0.2 x10E3/uL   Immature Granulocytes 0 Not Estab. %   Immature Grans (Abs) 0.0 0.0 - 0.1 x10E3/uL  Comprehensive metabolic panel  Result Value Ref Range   Glucose 99 65 - 99 mg/dL   BUN 13 8 - 27 mg/dL  Creatinine, Ser 1.20 (H) 0.57 - 1.00 mg/dL   GFR calc non Af Amer 44 (L) >59 mL/min/1.73   GFR calc Af Amer 51 (L) >59 mL/min/1.73   BUN/Creatinine Ratio 11 (L) 12 - 28   Sodium 143 134 - 144 mmol/L   Potassium 5.0 3.5 - 5.2 mmol/L   Chloride 105 96 - 106 mmol/L   CO2 22 20 - 29 mmol/L   Calcium 9.7 8.7 - 10.3 mg/dL   Total Protein 6.9 6.0 - 8.5 g/dL   Albumin 4.2 3.7 - 4.7 g/dL   Globulin, Total 2.7 1.5 - 4.5 g/dL   Albumin/Globulin Ratio 1.6 1.2 - 2.2   Bilirubin Total 0.5 0.0 - 1.2 mg/dL   Alkaline Phosphatase 86 39 - 117 IU/L   AST 27 0 - 40 IU/L   ALT 18 0 - 32 IU/L  Lipid Panel w/o Chol/HDL Ratio  Result Value Ref Range   Cholesterol, Total 197 100 - 199 mg/dL   Triglycerides 161 0 - 149 mg/dL   HDL 36 (L) >09 mg/dL   VLDL Cholesterol Cal 26 5 - 40 mg/dL   LDL Chol Calc (NIH) 604 (H) 0 - 99 mg/dL  Microalbumin, Urine Waived  Result Value Ref Range   Microalb, Ur Waived 80 (H) 0 - 19 mg/L   Creatinine, Urine Waived 300 10 - 300 mg/dL   Microalb/Creat Ratio 30-300 (H) <30 mg/g  TSH  Result Value Ref Range   TSH 3.370 0.450 - 4.500 uIU/mL  UA/M w/rflx Culture, Routine   Specimen: Urine   URINE  Result Value Ref Range   Specific Gravity, UA  1.020 1.005 - 1.030   pH, UA 6.0 5.0 - 7.5   Color, UA Yellow Yellow   Appearance Ur Cloudy (A) Clear   Leukocytes,UA 2+ (A) Negative   Protein,UA 1+ (A) Negative/Trace   Glucose, UA Negative Negative   Ketones, UA Trace (A) Negative   RBC, UA 2+ (A) Negative   Bilirubin, UA Negative Negative   Urobilinogen, Ur 1.0 0.2 - 1.0 mg/dL   Nitrite, UA Negative Negative   Microscopic Examination See below:    Urinalysis Reflex Comment       Assessment & Plan:   Problem List Items Addressed This Visit      Genitourinary   Benign hypertensive renal disease    Running slightly high today. Has been doing well. Continue DASH diet and continue to monitor. Call with any concerns.       Relevant Orders   CBC with Differential/Platelet   Comprehensive metabolic panel   Microalbumin, Urine Waived   TSH   Chronic kidney disease, stage 3    Rechecking labs today. Await results. Call with any concerns.       Relevant Orders   CBC with Differential/Platelet   Comprehensive metabolic panel     Other   Urge incontinence    Checking labs today. Await results.       Relevant Orders   CBC with Differential/Platelet   Comprehensive metabolic panel   Urinalysis, Routine w reflex microscopic   Anxiety    Doing better on her seroquel. Discussed the importance of her taking her medicine daily. Declined referral to counseling or psychiatry. Continue to monitor.       Depression, recurrent (HCC)    Doing better on her seroquel. Discussed the importance of her taking her medicine daily. Declined referral to counseling or psychiatry. Continue to monitor.       Relevant Orders   CBC  with Differential/Platelet   Comprehensive metabolic panel   TSH   Hyperlipidemia    Rechecking labs today. Has been doing well. Continue diet and continue to monitor. Call with any concerns.       Relevant Orders   CBC with Differential/Platelet   Comprehensive metabolic panel   Lipid Panel w/o Chol/HDL  Ratio    Other Visit Diagnoses    Routine general medical examination at a health care facility    -  Primary   Vaccines up to date. Screening labs checked today. Mammogram and pap N/A. Colonoscopy UTD. DEXA ordered today. Continue diet and exercise. Call with concerns.    Screening for osteoporosis       DEXA ordered today.   Relevant Orders   DG Bone Density   Pyuria       Await culture. Treat as needed.    Relevant Orders   Urine Culture       Follow up plan: Return in about 3 months (around 04/13/2020).   LABORATORY TESTING:  - Pap smear: not applicable  IMMUNIZATIONS:   - Tdap: Tetanus vaccination status reviewed: Post-poned due to covid vaccine. - Influenza: Up to date - Pneumovax: Up to date - Prevnar: Up to date  SCREENING: -Mammogram: Not applicable  - Colonoscopy: Up to date  - Bone Density: Ordered today   PATIENT COUNSELING:   Advised to take 1 mg of folate supplement per day if capable of pregnancy.   Sexuality: Discussed sexually transmitted diseases, partner selection, use of condoms, avoidance of unintended pregnancy  and contraceptive alternatives.   Advised to avoid cigarette smoking.  I discussed with the patient that most people either abstain from alcohol or drink within safe limits (<=14/week and <=4 drinks/occasion for males, <=7/weeks and <= 3 drinks/occasion for females) and that the risk for alcohol disorders and other health effects rises proportionally with the number of drinks per week and how often a drinker exceeds daily limits.  Discussed cessation/primary prevention of drug use and availability of treatment for abuse.   Diet: Encouraged to adjust caloric intake to maintain  or achieve ideal body weight, to reduce intake of dietary saturated fat and total fat, to limit sodium intake by avoiding high sodium foods and not adding table salt, and to maintain adequate dietary potassium and calcium preferably from fresh fruits, vegetables, and  low-fat dairy products.    stressed the importance of regular exercise  Injury prevention: Discussed safety belts, safety helmets, smoke detector, smoking near bedding or upholstery.   Dental health: Discussed importance of regular tooth brushing, flossing, and dental visits.    NEXT PREVENTATIVE PHYSICAL DUE IN 1 YEAR. Return in about 3 months (around 04/13/2020).

## 2020-01-12 NOTE — Patient Instructions (Signed)
Renee Bridges , Thank you for taking time to come for your Medicare Wellness Visit. I appreciate your ongoing commitment to your health goals. Please review the following plan we discussed and let me know if I can assist you in the future.   Screening recommendations/referrals: Colonoscopy: no longer required  Mammogram: no longer required  Bone Density: no longer required  Recommended yearly ophthalmology/optometry visit for glaucoma screening and checkup Recommended yearly dental visit for hygiene and checkup  Vaccinations: Influenza vaccine: up to date  Pneumococcal vaccine: up to date  Tdap vaccine: due now  Shingles vaccine: shingrix eligible    Covid-19: completed   Advanced directives: Advance directive discussed with you today. I have provided a copy for you to complete at home and have notarized. Once this is complete please bring a copy in to our office so we can scan it into your chart.  Conditions/risks identified: discussed possible pill packaging, will discuss option with Dr.Johnson  Next appointment: Follow up in one year for your annual wellness visit.    Preventive Care 77 Years and Older, Female Preventive care refers to lifestyle choices and visits with your health care provider that can promote health and wellness. What does preventive care include?  A yearly physical exam. This is also called an annual well check.  Dental exams once or twice a year.  Routine eye exams. Ask your health care provider how often you should have your eyes checked.  Personal lifestyle choices, including:  Daily care of your teeth and gums.  Regular physical activity.  Eating a healthy diet.  Avoiding tobacco and drug use.  Limiting alcohol use.  Practicing safe sex.  Taking low-dose aspirin every day.  Taking vitamin and mineral supplements as recommended by your health care provider. What happens during an annual well check? The services and screenings done by your  health care provider during your annual well check will depend on your age, overall health, lifestyle risk factors, and family history of disease. Counseling  Your health care provider may ask you questions about your:  Alcohol use.  Tobacco use.  Drug use.  Emotional well-being.  Home and relationship well-being.  Sexual activity.  Eating habits.  History of falls.  Memory and ability to understand (cognition).  Work and work Astronomer.  Reproductive health. Screening  You may have the following tests or measurements:  Height, weight, and BMI.  Blood pressure.  Lipid and cholesterol levels. These may be checked every 5 years, or more frequently if you are over 92 years old.  Skin check.  Lung cancer screening. You may have this screening every year starting at age 37 if you have a 30-pack-year history of smoking and currently smoke or have quit within the past 15 years.  Fecal occult blood test (FOBT) of the stool. You may have this test every year starting at age 20.  Flexible sigmoidoscopy or colonoscopy. You may have a sigmoidoscopy every 5 years or a colonoscopy every 10 years starting at age 18.  Hepatitis C blood test.  Hepatitis B blood test.  Sexually transmitted disease (STD) testing.  Diabetes screening. This is done by checking your blood sugar (glucose) after you have not eaten for a while (fasting). You may have this done every 1-3 years.  Bone density scan. This is done to screen for osteoporosis. You may have this done starting at age 70.  Mammogram. This may be done every 1-2 years. Talk to your health care provider about how often you  should have regular mammograms. Talk with your health care provider about your test results, treatment options, and if necessary, the need for more tests. Vaccines  Your health care provider may recommend certain vaccines, such as:  Influenza vaccine. This is recommended every year.  Tetanus, diphtheria, and  acellular pertussis (Tdap, Td) vaccine. You may need a Td booster every 10 years.  Zoster vaccine. You may need this after age 69.  Pneumococcal 13-valent conjugate (PCV13) vaccine. One dose is recommended after age 32.  Pneumococcal polysaccharide (PPSV23) vaccine. One dose is recommended after age 67. Talk to your health care provider about which screenings and vaccines you need and how often you need them. This information is not intended to replace advice given to you by your health care provider. Make sure you discuss any questions you have with your health care provider. Document Released: 09/17/2015 Document Revised: 05/10/2016 Document Reviewed: 06/22/2015 Elsevier Interactive Patient Education  2017 [employer] Prevention in the Home Falls can cause injuries. They can happen to people of all ages. There are many things you can do to make your home safe and to help prevent falls. What can I do on the outside of my home?  Regularly fix the edges of walkways and driveways and fix any cracks.  Remove anything that might make you trip as you walk through a door, such as a raised step or threshold.  Trim any bushes or trees on the path to your home.  Use bright outdoor lighting.  Clear any walking paths of anything that might make someone trip, such as rocks or tools.  Regularly check to see if handrails are loose or broken. Make sure that both sides of any steps have handrails.  Any raised decks and porches should have guardrails on the edges.  Have any leaves, snow, or ice cleared regularly.  Use sand or salt on walking paths during winter.  Clean up any spills in your garage right away. This includes oil or grease spills. What can I do in the bathroom?  Use night lights.  Install grab bars by the toilet and in the tub and shower. Do not use towel bars as grab bars.  Use non-skid mats or decals in the tub or shower.  If you need to sit down in the shower, use  a plastic, non-slip stool.  Keep the floor dry. Clean up any water that spills on the floor as soon as it happens.  Remove soap buildup in the tub or shower regularly.  Attach bath mats securely with double-sided non-slip rug tape.  Do not have throw rugs and other things on the floor that can make you trip. What can I do in the bedroom?  Use night lights.  Make sure that you have a light by your bed that is easy to reach.  Do not use any sheets or blankets that are too big for your bed. They should not hang down onto the floor.  Have a firm chair that has side arms. You can use this for support while you get dressed.  Do not have throw rugs and other things on the floor that can make you trip. What can I do in the kitchen?  Clean up any spills right away.  Avoid walking on wet floors.  Keep items that you use a lot in easy-to-reach places.  If you need to reach something above you, use a strong step stool that has a grab bar.  Keep electrical cords out  of the way.  Do not use floor polish or wax that makes floors slippery. If you must use wax, use non-skid floor wax.  Do not have throw rugs and other things on the floor that can make you trip. What can I do with my stairs?  Do not leave any items on the stairs.  Make sure that there are handrails on both sides of the stairs and use them. Fix handrails that are broken or loose. Make sure that handrails are as long as the stairways.  Check any carpeting to make sure that it is firmly attached to the stairs. Fix any carpet that is loose or worn.  Avoid having throw rugs at the top or bottom of the stairs. If you do have throw rugs, attach them to the floor with carpet tape.  Make sure that you have a light switch at the top of the stairs and the bottom of the stairs. If you do not have them, ask someone to add them for you. What else can I do to help prevent falls?  Wear shoes that:  Do not have high heels.  Have  rubber bottoms.  Are comfortable and fit you well.  Are closed at the toe. Do not wear sandals.  If you use a stepladder:  Make sure that it is fully opened. Do not climb a closed stepladder.  Make sure that both sides of the stepladder are locked into place.  Ask someone to hold it for you, if possible.  Clearly mark and make sure that you can see:  Any grab bars or handrails.  First and last steps.  Where the edge of each step is.  Use tools that help you move around (mobility aids) if they are needed. These include:  Canes.  Walkers.  Scooters.  Crutches.  Turn on the lights when you go into a dark area. Replace any light bulbs as soon as they burn out.  Set up your furniture so you have a clear path. Avoid moving your furniture around.  If any of your floors are uneven, fix them.  If there are any pets around you, be aware of where they are.  Review your medicines with your doctor. Some medicines can make you feel dizzy. This can increase your chance of falling. Ask your doctor what other things that you can do to help prevent falls. This information is not intended to replace advice given to you by your health care provider. Make sure you discuss any questions you have with your health care provider. Document Released: 06/17/2009 Document Revised: 01/27/2016 Document Reviewed: 09/25/2014 Elsevier Interactive Patient Education  2017 [employer].

## 2020-01-13 LAB — LIPID PANEL W/O CHOL/HDL RATIO
Cholesterol, Total: 219 mg/dL — ABNORMAL HIGH (ref 100–199)
HDL: 43 mg/dL (ref >39)
LDL Chol Calc (NIH): 147 mg/dL — ABNORMAL HIGH (ref 0–99)
Triglycerides: 160 mg/dL — ABNORMAL HIGH (ref 0–149)
VLDL Cholesterol Cal: 29 mg/dL (ref 5–40)

## 2020-01-13 LAB — COMPREHENSIVE METABOLIC PANEL
ALT: 16 IU/L (ref 0–32)
AST: 24 IU/L (ref 0–40)
Albumin/Globulin Ratio: 1.7 (ref 1.2–2.2)
Albumin: 4.2 g/dL (ref 3.7–4.7)
Alkaline Phosphatase: 82 IU/L (ref 39–117)
BUN/Creatinine Ratio: 11 — ABNORMAL LOW (ref 12–28)
BUN: 13 mg/dL (ref 8–27)
Bilirubin Total: 0.5 mg/dL (ref 0.0–1.2)
CO2: 25 mmol/L (ref 20–29)
Calcium: 9.8 mg/dL (ref 8.7–10.3)
Chloride: 102 mmol/L (ref 96–106)
Creatinine, Ser: 1.17 mg/dL — ABNORMAL HIGH (ref 0.57–1.00)
GFR calc Af Amer: 53 mL/min/{1.73_m2} — ABNORMAL LOW (ref >59)
GFR calc non Af Amer: 46 mL/min/{1.73_m2} — ABNORMAL LOW (ref >59)
Globulin, Total: 2.5 g/dL (ref 1.5–4.5)
Glucose: 87 mg/dL (ref 65–99)
Potassium: 4.7 mmol/L (ref 3.5–5.2)
Sodium: 139 mmol/L (ref 134–144)
Total Protein: 6.7 g/dL (ref 6.0–8.5)

## 2020-01-13 LAB — CBC WITH DIFFERENTIAL/PLATELET
Basophils Absolute: 0.1 10*3/uL (ref 0.0–0.2)
Basos: 1 %
EOS (ABSOLUTE): 0.1 10*3/uL (ref 0.0–0.4)
Eos: 2 %
Hematocrit: 47.3 % — ABNORMAL HIGH (ref 34.0–46.6)
Hemoglobin: 15.5 g/dL (ref 11.1–15.9)
Immature Grans (Abs): 0 10*3/uL (ref 0.0–0.1)
Immature Granulocytes: 0 %
Lymphocytes Absolute: 2.3 10*3/uL (ref 0.7–3.1)
Lymphs: 38 %
MCH: 31.3 pg (ref 26.6–33.0)
MCHC: 32.8 g/dL (ref 31.5–35.7)
MCV: 95 fL (ref 79–97)
Monocytes Absolute: 0.5 10*3/uL (ref 0.1–0.9)
Monocytes: 9 %
Neutrophils Absolute: 3 10*3/uL (ref 1.4–7.0)
Neutrophils: 50 %
Platelets: 232 10*3/uL (ref 150–450)
RBC: 4.96 x10E6/uL (ref 3.77–5.28)
RDW: 12.5 % (ref 11.7–15.4)
WBC: 6.1 10*3/uL (ref 3.4–10.8)

## 2020-01-13 LAB — TSH: TSH: 2.85 u[IU]/mL (ref 0.450–4.500)

## 2020-01-14 LAB — URINE CULTURE

## 2020-01-29 DIAGNOSIS — M545 Low back pain: Secondary | ICD-10-CM | POA: Diagnosis not present

## 2020-02-18 DIAGNOSIS — H903 Sensorineural hearing loss, bilateral: Secondary | ICD-10-CM | POA: Diagnosis not present

## 2020-02-20 ENCOUNTER — Telehealth: Payer: Medicaid Other

## 2020-02-24 ENCOUNTER — Ambulatory Visit (INDEPENDENT_AMBULATORY_CARE_PROVIDER_SITE_OTHER): Payer: Medicare Other | Admitting: General Practice

## 2020-02-24 ENCOUNTER — Telehealth: Payer: Medicaid Other | Admitting: General Practice

## 2020-02-24 DIAGNOSIS — N183 Chronic kidney disease, stage 3 unspecified: Secondary | ICD-10-CM

## 2020-02-24 DIAGNOSIS — E782 Mixed hyperlipidemia: Secondary | ICD-10-CM

## 2020-02-24 DIAGNOSIS — I10 Essential (primary) hypertension: Secondary | ICD-10-CM | POA: Diagnosis not present

## 2020-02-24 DIAGNOSIS — F339 Major depressive disorder, recurrent, unspecified: Secondary | ICD-10-CM | POA: Diagnosis not present

## 2020-02-24 DIAGNOSIS — F419 Anxiety disorder, unspecified: Secondary | ICD-10-CM

## 2020-02-24 DIAGNOSIS — I129 Hypertensive chronic kidney disease with stage 1 through stage 4 chronic kidney disease, or unspecified chronic kidney disease: Secondary | ICD-10-CM

## 2020-02-24 NOTE — Patient Instructions (Signed)
Visit Information  Goals Addressed            This Visit's Progress    RNCM: Chronic Disease Managment and suppor       Current Barriers:   Chronic Disease Management support, education, and care coordination needs related to HTN, HLD, CKD Stage 3, and Depression  Clinical Goal(s) related to HTN, HLD, CKD Stage 3, and Depression:  Over the next 120 days, patient will:   Work with the care management team to address educational, disease management, and care coordination needs   Begin or continue self health monitoring activities as directed today Measure and record blood pressure 2 times per week  Call provider office for new or worsened signs and symptoms Blood pressure findings outside established parameters, New or worsened symptom related to HTN, HLD, Chronic Kidney Disease 3, and depression  Call care management team with questions or concerns  Verbalize basic understanding of patient centered plan of care established today  Interventions related to HTN, HLD, CKD Stage 3, and Depression:   Evaluation of current treatment plans and patient's adherence to plan as established by provider. The patient's granddaughter [name] states the patient seems to be doing better since the last pcp visit on April 12th.  The patient has a new dog and this is a smaller dog and working out better than the bigger dog. 02-24-2020: The granddaughter feels the patient is not taking her medications as directed. The granddaughter further states that the patient is "worked up" right now and there is a lot of family drama going on with the patients grandson and the use of the patients Zenaida Niece that the patient purchased 3 months ago that the granddaughter knew nothing about. The patient has been avoiding talking to the granddaughter and depending on her to help in her care.   Assessed patient understanding of disease states.  The granddaughter states she understands her chronic conditions but wants to talk to pcp  about the patients mood changes and not acting age appropriate. The granddaughter is open to ideas and recommendations from the care team. 02-24-2020:  The granddaughter continue to ask for recommendations on how to best help the patient. She is concerned about the patient driving. She feels if she is taking her medications as directed that it is not an issue; however if she goes out driving in a rage she is concerned that she will hurt herself or someone else.  Currently the patient does not have her Zenaida Niece because her grandson is using it but the patients granddaughter ask for suggestions and recommendations from the pcp.  Will send an in basket message to Dr. Laural Benes asking for recommendations.   Assessed patient's education and care coordination needs. Follow up on care guide referral that was placed on 12-24-2019 by LCSW.  The granddaughter wants to get the patient closer to her.  She feels this will help her not feel so isolated and the granddaughter can check on her more frequently. Will send a message to the care guide pool for request to reach out to the granddaughter.  Completed  Evaluation of HTN/HLD/CKD/Depression/Anxiety: The patients granddaughter does not have any vital sign readings for the [facility]. Attempts to reach the patient were unsuccessful. The granddaughter verbalized the patient has tried to call her today but the patient is upset and likely wants to "fuss" about her grandson having her Zenaida Niece. The granddaughter feels the patient is not being honest with her and is lying about taking care of herself and other  things. The granddaughter has not been told by the patient that she has a Printmaker.  The granddaughter says the patient creates a lot of "drama" in the family.  Empathetic listening and support given. The granddaughter feels the patient is safe but does wish she would consider talking to a psychiatrist.  The patient has refused this in the past.   Provided disease specific education to patient.  Education on help and resources to help with her depression and anxiety. The patient seems to be doing better since getting a smaller dog. The patient seems to be taking her medication at this time.   Collaborated with appropriate clinical care team members regarding patient needs.  Pharmacy and LCSW ongoing support and help with managing health and well being of the patient.   Patient Self Care Activities related to HTN, HLD, CKD Stage 3, and Depression:   Patient is unable to independently self-manage chronic health conditions  Please see past updates related to this goal by clicking on the "Past Updates" button in the selected goal       RNCM: Tell Dr. Wynetta Emery I stopped taking that other medicine. It makes me jittery       Current Barriers:   Knowledge Deficits related to understanding the significance of taking medications as prescribed for chronic medical conditions because the patient "feels good"  Lacks caregiver support. Daughter Lovey Newcomer Deceased x 2 years, Daughter Judeen Hammans not involved, has a granddaughter [name] that helps when she can  Literacy barriers  Transportation barriers  Non-adherence to scheduled provider appointments  Non-adherence to prescribed medication regimen  Cognitive Deficits  Chronic Disease Management support and education needs related to HTN, HLD, Chronic Kidney Disease stage 3 and Depression  Nurse Case Manager Clinical Goal(s):   Over the next 120 days, patient will work with pcp and CCM team  to address needs related to management of chronic disease processes and taking prescribed medications as directed   Over the next 120 days, patient will demonstrate a decrease in depression  exacerbations as evidenced by of the patient taking medications as directed  Over the next 120 days, patient will demonstrate improved health management independence as evidenced by following recommendations by the care team to help the patient with her health and well  being  Over the next 90 days, patient will work with CM team pharmacist to manage medications and compliance concerns  Over the next 90 days, patient will work with CM clinical social worker to gain effective coping skills for her depression and the loss of her daughter 2 years ago  Over the next 50 days, patient will work with care guide team to assist with low income housing closer to her granddaughter in [place]  Over the next 75 days, the patient will demonstrate ongoing self health care management ability as evidenced by following the recommendations of the pcp and CCM team  Interventions:   Evaluation of current treatment plan related to HTN, HLD, Chronic Kidney disease, and depression and patient's adherence to plan as established by provider.  Advised patient to schedule an appointment to come in to see pcp: front office staff contacted to reach out to the granddaughter [name] to schedule an appointment.  02-24-2020: The patient next appointment with the pcp is 04-15-2020.   Reviewed medications with patient and discussed noncompliance of medication regimen and the need to take medications as directed.  02-24-2020: The patients granddaughter states that the patient tells her she is taking her medication as directed but she  feels the patient is lying to her about it. RNCM attempted to call the patient to discuss her health and wellness but the patient did not answer. RNCM left a message.   Collaborated with pcp and CCM team  regarding noncompliance of treatment regimen for chronic disease management.  02-24-2020: This is an ongoing issue with the patient per the patients granddaughter.   Discussed plans with patient for ongoing care management follow up and provided patient with direct contact information for care management team  Care Guide referral for assistance in finding low income housing in [place] so the patient can be closer to her granddaughter.  02-24-2020: the granddaughter  verbalized she has checked into some places closer to her but the price is out of range. She is not sure she would move or not. The patient purchased a Zenaida Niece and has not told the granddaughter. The granddaughter says she has had a Zenaida Niece about 3 months. [name]'s brother Clide Cliff finally told her that the patient was allowing him to use it at this time until he could get his own transportation. [name] states that the patient has not been talking to her much and has been avoiding her for about 3 weeks.   Social Work referral for depression and multiple issues related to grief and loss  Pharmacy referral for noncompliance to prescribed medication regimen- the patient confirmed she is not taking any medications because she "feels good" and she stopped taking that "other medicine" because it made her feel "jittery" (medication fluoxetine for her depression)  Patient Self Care Activities:   Patient verbalizes understanding of plan to see the pcp in the next 30 days to evaluate health and wellbeing  Self administers medications as prescribed  Attends all scheduled provider appointments  Calls provider office for new concerns or questions  Unable to independently manage chronic disease processes   Unable to self administer medications as prescribed  Does not adhere to provider recommendations re: care plan and taking medications as prescribed   Lacks social connections  Please see past updates related to this goal by clicking on the "Past Updates" button in the selected goal         Patient verbalizes understanding of instructions provided today.   The care management team will reach out to the patient again over the next 30 to 60 days.   Alto Denver RN, MSN, CCM Community Care Coordinator [place]   [facility] Mobile: [phone]

## 2020-02-24 NOTE — Chronic Care Management (AMB) (Signed)
Chronic Care Management   Follow Up Note   02/24/2020 Name: Renee Bridges MRN: [id number] DOB: 13-Sep-1945  Referred by: Renee Carrow, DO Reason for referral : Chronic Care Management (Follow up: RNCM Chronic Disease Management and Care Coordination Needs)   Renee Bridges is a 74 y.o. year old female who is a primary care patient of Renee Carrow, DO. The CCM team was consulted for assistance with chronic disease management and care coordination needs.    Review of patient status, including review of consultants reports, relevant laboratory and other test results, and collaboration with appropriate care team members and the patient's provider was performed as part of comprehensive patient evaluation and provision of chronic care management services.    SDOH (Social Determinants of Health) assessments performed: Yes See Care Plan activities for detailed interventions related to [facility])     Outpatient Encounter Medications as of 02/24/2020  Medication Sig  . cetirizine (ZYRTEC) 10 MG tablet Take 1 tablet (10 mg total) by mouth daily.  . QUEtiapine (SEROQUEL XR) 50 MG TB24 24 hr tablet Take 1 tablet (50 mg total) by mouth at bedtime.   No facility-administered encounter medications on file as of 02/24/2020.     Objective:  BP Readings from Last 3 Encounters:  01/12/20 (!) 144/82  01/12/20 (!) 144/82  12/15/19 (!) 155/77    Goals Addressed            This Visit's Progress   . RNCM: Chronic Disease Managment and suppor       Current Barriers:  . Chronic Disease Management support, education, and care coordination needs related to HTN, HLD, CKD Stage 3, and Depression  Clinical Goal(s) related to HTN, HLD, CKD Stage 3, and Depression:  Over the next 120 days, patient will:  . Work with the care management team to address educational, disease management, and care coordination needs  . Begin or continue self health monitoring activities as directed today Measure and record  blood pressure 2 times per week . Call provider office for new or worsened signs and symptoms Blood pressure findings outside established parameters, New or worsened symptom related to HTN, HLD, Chronic Kidney Disease 3, and depression . Call care management team with questions or concerns . Verbalize basic understanding of patient centered plan of care established today  Interventions related to HTN, HLD, CKD Stage 3, and Depression:  . Evaluation of current treatment plans and patient's adherence to plan as established by provider. The patient's granddaughter Renee Bridges states the patient seems to be doing better since the last pcp visit on April 12th.  The patient has a new dog and this is a smaller dog and working out better than the bigger dog. 02-24-2020: The granddaughter feels the patient is not taking her medications as directed. The granddaughter further states that the patient is "worked up" right now and there is a lot of family drama going on with the patients grandson and the use of the patients Zenaida Niece that the patient purchased 3 months ago that the granddaughter knew nothing about. The patient has been avoiding talking to the granddaughter and depending on her to help in her care.  . Assessed patient understanding of disease states.  The granddaughter states she understands her chronic conditions but wants to talk to pcp about the patients mood changes and not acting age appropriate. The granddaughter is open to ideas and recommendations from the care team. 02-24-2020:  The granddaughter continue to ask for recommendations on how to  best help the patient. She is concerned about the patient driving. She feels if she is taking her medications as directed that it is not an issue; however if she goes out driving in a rage she is concerned that she will hurt herself or someone else.  Currently the patient does not have her Renee Bridges because her grandson is using it but the patients granddaughter ask for  suggestions and recommendations from the pcp.  Will send an in basket message to Dr. Wynetta Bridges asking for recommendations.  . Assessed patient's education and care coordination needs. Follow up on care guide referral that was placed on 12-24-2019 by LCSW.  The granddaughter wants to get the patient closer to her.  She feels this will help her not feel so isolated and the granddaughter can check on her more frequently. Will send a message to the care guide pool for request to reach out to the granddaughter.  Completed . Evaluation of HTN/HLD/CKD/Depression/Anxiety: The patients granddaughter does not have any vital sign readings for the [facility]. Attempts to reach the patient were unsuccessful. The granddaughter verbalized the patient has tried to call her today but the patient is upset and likely wants to "fuss" about her grandson having her Renee Bridges. The granddaughter feels the patient is not being honest with her and is lying about taking care of herself and other things. The granddaughter has not been told by the patient that she has a Printmaker.  The granddaughter says the patient creates a lot of "drama" in the family.  Empathetic listening and support given. The granddaughter feels the patient is safe but does wish she would consider talking to a psychiatrist.  The patient has refused this in the past.  . Provided disease specific education to patient. Education on help and resources to help with her depression and anxiety. The patient seems to be doing better since getting a smaller dog. The patient seems to be taking her medication at this time.  Renee Bridges with appropriate clinical care team members regarding patient needs.  Pharmacy and LCSW ongoing support and help with managing health and well being of the patient.   Patient Self Care Activities related to HTN, HLD, CKD Stage 3, and Depression:  . Patient is unable to independently self-manage chronic health conditions  Please see past updates related to this  goal by clicking on the "Past Updates" button in the selected goal      . RNCM: Tell Dr. Wynetta Bridges I stopped taking that other medicine. It makes me jittery       Current Barriers:  Marland Kitchen Knowledge Deficits related to understanding the significance of taking medications as prescribed for chronic medical conditions because the patient "feels good" . Lacks caregiver support. Daughter Renee Bridges Deceased x 2 years, Daughter Renee Bridges not involved, has a granddaughter Renee Bridges that helps when she can . Literacy barriers . Transportation barriers . Non-adherence to scheduled provider appointments . Non-adherence to prescribed medication regimen . Cognitive Deficits . Chronic Disease Management support and education needs related to HTN, HLD, Chronic Kidney Disease stage 3 and Depression  Nurse Case Manager Clinical Goal(s):  Marland Kitchen Over the next 120 days, patient will work with pcp and CCM team  to address needs related to management of chronic disease processes and taking prescribed medications as directed  . Over the next 120 days, patient will demonstrate a decrease in depression  exacerbations as evidenced by of the patient taking medications as directed . Over the next 120 days, patient will demonstrate improved  health management independence as evidenced by following recommendations by the care team to help the patient with her health and well being . Over the next 90 days, patient will work with CM team pharmacist to manage medications and compliance concerns . Over the next 90 days, patient will work with CM clinical social worker to gain effective coping skills for her depression and the loss of her daughter 2 years ago . Over the next 90 days, patient will work with care guide team to assist with low income housing closer to her granddaughter in [place] . Over the next 90 days, the patient will demonstrate ongoing self health care management ability as evidenced by following the recommendations of the pcp  and CCM team  Interventions:  . Evaluation of current treatment plan related to HTN, HLD, Chronic Kidney disease, and depression and patient's adherence to plan as established by provider. . Advised patient to schedule an appointment to come in to see pcp: front office staff contacted to reach out to the granddaughter Renee Bridges to schedule an appointment.  02-24-2020: The patient next appointment with the pcp is 04-15-2020.  Marland Kitchen Reviewed medications with patient and discussed noncompliance of medication regimen and the need to take medications as directed.  02-24-2020: The patients granddaughter states that the patient tells her she is taking her medication as directed but she feels the patient is lying to her about it. RNCM attempted to call the patient to discuss her health and wellness but the patient did not answer. RNCM left a message.  Marland Kitchen Collaborated with pcp and CCM team  regarding noncompliance of treatment regimen for chronic disease management.  02-24-2020: This is an ongoing issue with the patient per the patients granddaughter.  . Discussed plans with patient for ongoing care management follow up and provided patient with direct contact information for care management team . Care Guide referral for assistance in finding low income housing in [place] so the patient can be closer to her granddaughter.  02-24-2020: the granddaughter verbalized she has checked into some places closer to her but the price is out of range. She is not sure she would move or not. The patient purchased a Zenaida Niece and has not told the granddaughter. The granddaughter says she has had a Zenaida Niece about 3 months. Renee Bridges's brother Renee Bridges finally told her that the patient was allowing him to use it at this time until he could get his own transportation. Renee Bridges states that the patient has not been talking to her much and has been avoiding her for about 3 weeks.  . Social Work referral for depression and multiple issues related to grief and  loss . Pharmacy referral for noncompliance to prescribed medication regimen- the patient confirmed she is not taking any medications because she "feels good" and she stopped taking that "other medicine" because it made her feel "jittery" (medication fluoxetine for her depression)  Patient Self Care Activities:  . Patient verbalizes understanding of plan to see the pcp in the next 30 days to evaluate health and wellbeing . Self administers medications as prescribed . Attends all scheduled provider appointments . Calls provider office for new concerns or questions . Unable to independently manage chronic disease processes  . Unable to self administer medications as prescribed . Does not adhere to provider recommendations re: care plan and taking medications as prescribed  . Lacks social connections  Please see past updates related to this goal by clicking on the "Past Updates" button in the selected goal  Plan:   The care management team will reach out to the patient again over the next 30 to 60 days.    Alto Denver RN, MSN, CCM Community Care Coordinator [place]  [facility] Mobile: [phone]

## 2020-02-26 ENCOUNTER — Ambulatory Visit: Payer: Medicare Other | Admitting: Family Medicine

## 2020-02-27 ENCOUNTER — Encounter: Payer: Self-pay | Admitting: Family Medicine

## 2020-02-27 ENCOUNTER — Other Ambulatory Visit: Payer: Self-pay

## 2020-02-27 ENCOUNTER — Ambulatory Visit (INDEPENDENT_AMBULATORY_CARE_PROVIDER_SITE_OTHER): Payer: Medicare Other | Admitting: Family Medicine

## 2020-02-27 VITALS — BP 125/86 | HR 82 | Temp 97.5°F | Wt 169.6 lb

## 2020-02-27 DIAGNOSIS — F339 Major depressive disorder, recurrent, unspecified: Secondary | ICD-10-CM | POA: Diagnosis not present

## 2020-02-27 DIAGNOSIS — F419 Anxiety disorder, unspecified: Secondary | ICD-10-CM | POA: Diagnosis not present

## 2020-02-27 DIAGNOSIS — Z638 Other specified problems related to primary support group: Secondary | ICD-10-CM

## 2020-02-27 DIAGNOSIS — S81801A Unspecified open wound, right lower leg, initial encounter: Secondary | ICD-10-CM

## 2020-02-27 DIAGNOSIS — Z23 Encounter for immunization: Secondary | ICD-10-CM | POA: Diagnosis not present

## 2020-02-27 DIAGNOSIS — Z9189 Other specified personal risk factors, not elsewhere classified: Secondary | ICD-10-CM

## 2020-02-27 NOTE — Progress Notes (Signed)
BP 125/86 (BP Location: Left Arm, Patient Position: Sitting, Cuff Size: Normal)   Pulse 82   Temp (!) 97.5 F (36.4 C) (Oral)   Wt 169 lb 9.6 oz (76.9 kg)   LMP  (LMP Unknown)   SpO2 96%   BMI 26.17 kg/m    Subjective:    Patient ID: Renee Bridges, female    DOB: June 19, 1946, 74 y.o.   MRN: [id number]  HPI: CERENITI CURB is a 74 y.o. female  Chief Complaint  Patient presents with  . Depression  . Anxiety  . general concerns    driving   Presents today with her granddaughter. She bought a minivan about a year ago and did not tell her granddaughter. She drives around home. Her granddaughter notes that she is very concerned about her driving. She notes that she gets very upset and she is concerned that she is going to do harm to herself or others if she is driving when she is upset. She is calling the police on her grandson. She is crying and upset about her issues with her family.   ANXIETY/DEPRESSION Duration: chronic Status:exacerbated Anxious mood: yes  Excessive worrying: yes Irritability: yes  Sweating: no Nausea: no Palpitations:no Hyperventilation: no Panic attacks: yes Agoraphobia: no  Obscessions/compulsions: yes Depressed mood: yes Depression screen [facility] 2/9 02/27/2020 12/15/2019 11/21/2019 06/27/2019 05/22/2019  Decreased Interest 0 0 0 1 1  Down, Depressed, Hopeless 1 0 0 2 0  PHQ - 2 Score 1 0 0 3 1  Altered sleeping 3 2 3 3 3   Tired, decreased energy 3 0 0 3 1  Change in appetite 1 0 2 3 3   Feeling bad or failure about yourself  0 0 0 0 0  Trouble concentrating 0 0 0 0 0  Moving slowly or fidgety/restless 3 2 2  0 0  Suicidal thoughts 0 0 0 0 0  PHQ-9 Score 11 4 7 12 8   Difficult doing work/chores Very difficult Very difficult Somewhat difficult Very difficult Somewhat difficult  Some recent data might be hidden   GAD 7 : Generalized Anxiety Score 02/27/2020 12/15/2019 02/28/2019  Nervous, Anxious, on Edge 2 3 1   Control/stop worrying 3 3 2   Worry too  much - different things 3 3 2   Trouble relaxing 2 2 1   Restless 3 2 2   Easily annoyed or irritable 3 3 1   Afraid - awful might happen 2 3 2   Total GAD 7 Score 18 19 11   Anxiety Difficulty Very difficult Extremely difficult Not difficult at all   Anhedonia: no Weight changes: no Insomnia: yes hard to fall asleep  Hypersomnia: no Fatigue/loss of energy: yes Feelings of worthlessness: yes Feelings of guilt: yes Impaired concentration/indecisiveness: yes Suicidal ideations: no  Crying spells: yes Recent Stressors/Life Changes: yes   Relationship problems: yes   Family stress: yes     Financial stress: yes    Job stress: no    Recent death/loss: no  Relevant past medical, surgical, family and social history reviewed and updated as indicated. Interim medical history since our last visit reviewed. Allergies and medications reviewed and updated.  Review of Systems  Constitutional: Negative.   Respiratory: Negative.   Cardiovascular: Negative.   Gastrointestinal: Negative.   Neurological: Negative.   Psychiatric/Behavioral: Positive for agitation, behavioral problems, confusion, decreased concentration, dysphoric mood and sleep disturbance. Negative for hallucinations, self-injury and suicidal ideas. The patient is nervous/anxious. The patient is not hyperactive.     Per HPI unless specifically indicated  above     Objective:    BP 125/86 (BP Location: Left Arm, Patient Position: Sitting, Cuff Size: Normal)   Pulse 82   Temp (!) 97.5 F (36.4 C) (Oral)   Wt 169 lb 9.6 oz (76.9 kg)   LMP  (LMP Unknown)   SpO2 96%   BMI 26.17 kg/m   Wt Readings from Last 3 Encounters:  02/27/20 169 lb 9.6 oz (76.9 kg)  01/12/20 175 lb (79.4 kg)  01/12/20 175 lb (79.4 kg)    Physical Exam Vitals and nursing note reviewed.  Constitutional:      General: She is not in acute distress.    Appearance: Normal appearance. She is not ill-appearing, toxic-appearing or diaphoretic.  HENT:      Head: Normocephalic and atraumatic.     Right Ear: External ear normal.     Left Ear: External ear normal.     Nose: Nose normal.     Mouth/Throat:     Mouth: Mucous membranes are moist.     Pharynx: Oropharynx is clear.  Eyes:     General: No scleral icterus.       Right eye: No discharge.        Left eye: No discharge.     Extraocular Movements: Extraocular movements intact.     Conjunctiva/sclera: Conjunctivae normal.     Pupils: Pupils are equal, round, and reactive to light.  Cardiovascular:     Rate and Rhythm: Normal rate and regular rhythm.     Pulses: Normal pulses.     Heart sounds: Normal heart sounds. No murmur heard.  No friction rub. No gallop.   Pulmonary:     Effort: Pulmonary effort is normal. No respiratory distress.     Breath sounds: Normal breath sounds. No stridor. No wheezing, rhonchi or rales.  Chest:     Chest wall: No tenderness.  Musculoskeletal:        General: Normal range of motion.     Cervical back: Normal range of motion and neck supple.  Skin:    General: Skin is warm and dry.     Capillary Refill: Capillary refill takes less than 2 seconds.     Coloration: Skin is not jaundiced or pale.     Findings: No bruising, erythema, lesion or rash.  Neurological:     General: No focal deficit present.     Mental Status: She is alert and oriented to person, place, and time. Mental status is at baseline.  Psychiatric:        Mood and Affect: Mood is anxious and depressed. Affect is labile, angry and tearful.        Speech: Speech is tangential.        Behavior: Behavior is agitated and aggressive.        Thought Content: Thought content normal.        Judgment: Judgment is impulsive.     Results for orders placed or performed in visit on 01/12/20  Urine Culture   Specimen: Urine   UR  Result Value Ref Range   Urine Culture, Routine Final report    Organism ID, Bacteria Comment   Microscopic Examination   URINE  Result Value Ref Range   WBC,  UA >30 (A) 0 - 5 /hpf   RBC 3-10 (A) 0 - 2 /hpf   Epithelial Cells (non renal) 0-10 0 - 10 /hpf   Mucus, UA Present Not Estab.   Bacteria, UA Moderate (A) None seen/Few  CBC with  Differential/Platelet  Result Value Ref Range   WBC 6.1 3.4 - 10.8 x10E3/uL   RBC 4.96 3.77 - 5.28 x10E6/uL   Hemoglobin 15.5 11.1 - 15.9 g/dL   Hematocrit 93.8 (H) 18.2 - 46.6 %   MCV 95 79 - 97 fL   MCH 31.3 26.6 - 33.0 pg   MCHC 32.8 31 - 35 g/dL   RDW 99.3 71.6 - 96.7 %   Platelets 232 150 - 450 x10E3/uL   Neutrophils 50 Not Estab. %   Lymphs 38 Not Estab. %   Monocytes 9 Not Estab. %   Eos 2 Not Estab. %   Basos 1 Not Estab. %   Neutrophils Absolute 3.0 1 - 7 x10E3/uL   Lymphocytes Absolute 2.3 0 - 3 x10E3/uL   Monocytes Absolute 0.5 0 - 0 x10E3/uL   EOS (ABSOLUTE) 0.1 0.0 - 0.4 x10E3/uL   Basophils Absolute 0.1 0 - 0 x10E3/uL   Immature Granulocytes 0 Not Estab. %   Immature Grans (Abs) 0.0 0.0 - 0.1 x10E3/uL  Comprehensive metabolic panel  Result Value Ref Range   Glucose 87 65 - 99 mg/dL   BUN 13 8 - 27 mg/dL   Creatinine, Ser 8.93 (H) 0.57 - 1.00 mg/dL   GFR calc non Af Amer 46 (L) >59 mL/min/1.73   GFR calc Af Amer 53 (L) >59 mL/min/1.73   BUN/Creatinine Ratio 11 (L) 12 - 28   Sodium 139 134 - 144 mmol/L   Potassium 4.7 3.5 - 5.2 mmol/L   Chloride 102 96 - 106 mmol/L   CO2 25 20 - 29 mmol/L   Calcium 9.8 8.7 - 10.3 mg/dL   Total Protein 6.7 6.0 - 8.5 g/dL   Albumin 4.2 3.7 - 4.7 g/dL   Globulin, Total 2.5 1.5 - 4.5 g/dL   Albumin/Globulin Ratio 1.7 1.2 - 2.2   Bilirubin Total 0.5 0.0 - 1.2 mg/dL   Alkaline Phosphatase 82 39 - 117 IU/L   AST 24 0 - 40 IU/L   ALT 16 0 - 32 IU/L  Lipid Panel w/o Chol/HDL Ratio  Result Value Ref Range   Cholesterol, Total 219 (H) 100 - 199 mg/dL   Triglycerides 810 (H) 0 - 149 mg/dL   HDL 43 >17 mg/dL   VLDL Cholesterol Cal 29 5 - 40 mg/dL   LDL Chol Calc (NIH) 510 (H) 0 - 99 mg/dL  Microalbumin, Urine Waived  Result Value Ref Range   Microalb,  Ur Waived 30 (H) 0 - 19 mg/L   Creatinine, Urine Waived 50 10 - 300 mg/dL   Microalb/Creat Ratio 30-300 (H) <30 mg/g  TSH  Result Value Ref Range   TSH 2.850 0.450 - 4.500 uIU/mL  Urinalysis, Routine w reflex microscopic  Result Value Ref Range   Specific Gravity, UA 1.015 1.005 - 1.030   pH, UA 6.5 5.0 - 7.5   Color, UA Yellow Yellow   Appearance Ur Hazy (A) Clear   Leukocytes,UA 2+ (A) Negative   Protein,UA Negative Negative/Trace   Glucose, UA Negative Negative   Ketones, UA Negative Negative   RBC, UA 2+ (A) Negative   Bilirubin, UA Negative Negative   Urobilinogen, Ur 0.2 0.2 - 1.0 mg/dL   Nitrite, UA Negative Negative   Microscopic Examination See below:       Assessment & Plan:   Problem List Items Addressed This Visit      Other   Anxiety    Exacerbated again. Unclear if she's taking her medicine. Having a lot  of issues with her family. Lots of social stressors. Very upset and tearful today. Agreed today to speaking to psychiatry and a counselor as her granddaughter is unclear how much longer she is going to be able to care for her at this level. Referral to psychiatry made today. Continue to monitor closely. Call with any concerns.       Relevant Orders   Ambulatory referral to Psychiatry   Depression, recurrent ([place]) - Primary    Exacerbated again. Unclear if she's taking her medicine. Having a lot of issues with her family. Lots of social stressors. Very upset and tearful today. Agreed today to speaking to psychiatry and a counselor as her granddaughter is unclear how much longer she is going to be able to care for her at this level. Referral to psychiatry made today. Continue to monitor closely. Call with any concerns.       Relevant Orders   Ambulatory referral to Psychiatry    Other Visit Diagnoses    Open wound of right lower leg, initial encounter       Consistent with bug bite. Td due. Given today   Relevant Orders   Td : Tetanus/diphtheria >7yo  Preservative  free (Completed)   Stress due to family tension       Granddaughter is unsure how much longer she can continue down current path. Will get her into psychiatry. Needs counseling. Referral generated today.   Relevant Orders   Ambulatory referral to Psychiatry   Driving safety issue       Concern expressed to DMV. I would recommend driving test. Await their input.       Follow up plan: Return as scheduled.  >45 minutes spent in counseling and coordination of care of patient today.

## 2020-02-27 NOTE — Assessment & Plan Note (Signed)
Exacerbated again. Unclear if she's taking her medicine. Having a lot of issues with her family. Lots of social stressors. Very upset and tearful today. Agreed today to speaking to psychiatry and a counselor as her granddaughter is unclear how much longer she is going to be able to care for her at this level. Referral to psychiatry made today. Continue to monitor closely. Call with any concerns.  

## 2020-02-27 NOTE — Assessment & Plan Note (Signed)
Exacerbated again. Unclear if she's taking her medicine. Having a lot of issues with her family. Lots of social stressors. Very upset and tearful today. Agreed today to speaking to psychiatry and a counselor as her granddaughter is unclear how much longer she is going to be able to care for her at this level. Referral to psychiatry made today. Continue to monitor closely. Call with any concerns.

## 2020-03-02 DIAGNOSIS — H905 Unspecified sensorineural hearing loss: Secondary | ICD-10-CM | POA: Diagnosis not present

## 2020-03-17 ENCOUNTER — Ambulatory Visit: Payer: Medicare Other | Admitting: Family Medicine

## 2020-04-02 ENCOUNTER — Ambulatory Visit: Payer: Medicare Other | Admitting: Cardiovascular Disease

## 2020-04-05 ENCOUNTER — Other Ambulatory Visit (HOSPITAL_COMMUNITY): Payer: Self-pay

## 2020-04-05 ENCOUNTER — Ambulatory Visit (INDEPENDENT_AMBULATORY_CARE_PROVIDER_SITE_OTHER): Payer: Medicare Other | Admitting: Cardiology

## 2020-04-05 ENCOUNTER — Other Ambulatory Visit: Payer: Self-pay

## 2020-04-05 ENCOUNTER — Encounter: Payer: Self-pay | Admitting: Cardiology

## 2020-04-05 VITALS — BP 112/86 | HR 66 | Ht 63.0 in | Wt 169.0 lb

## 2020-04-05 DIAGNOSIS — E7800 Pure hypercholesterolemia, unspecified: Secondary | ICD-10-CM | POA: Diagnosis not present

## 2020-04-05 MED ORDER — ATORVASTATIN CALCIUM 20 MG PO TABS
20.0000 mg | ORAL_TABLET | Freq: Every day | ORAL | 3 refills | Status: DC
Start: 2020-04-05 — End: 2020-10-04

## 2020-04-05 NOTE — Patient Instructions (Signed)
Medication Instructions:   Your physician has recommended you make the following change in your medication:   1.  START taking atorvastatin (LIPITOR): Take 1 tablet (20 mg total) by mouth daily.  *If you need a refill on your cardiac medications before your next appointment, please call your pharmacy*   Lab Work: Your physician recommends that you return for a FASTING lipid profile: Within a week of your 3 month follow up appointment. - You will need to be fasting. Please do not have anything to eat or drink after midnight the morning you have the lab work. You may only have water or black coffee with no cream or sugar. - Please go to the [facility]. You will check in at the front desk to the right as you walk into the atrium. Valet Parking is offered if needed. - No appointment needed. You may go any day between 7 am and 6 pm.  If you have labs (blood work) drawn today and your tests are completely normal, you will receive your results only by: Marland Kitchen MyChart Message (if you have MyChart) OR . A paper copy in the mail If you have any lab test that is abnormal or we need to change your treatment, we will call you to review the results.   Testing/Procedures: None Ordered.   Follow-Up: At [facility], you and your health needs are our priority.  As part of our continuing mission to provide you with exceptional heart care, we have created designated Provider Care Teams.  These Care Teams include your primary Cardiologist (physician) and Advanced Practice Providers (APPs -  Physician Assistants and Nurse Practitioners) who all work together to provide you with the care you need, when you need it.  We recommend signing up for the patient portal called "MyChart".  Sign up information is provided on this After Visit Summary.  MyChart is used to connect with patients for Virtual Visits (Telemedicine).  Patients are able to view lab/test results, encounter notes, upcoming appointments, etc.   Non-urgent messages can be sent to your provider as well.   To learn more about what you can do with MyChart, go to [email or web address].    Your next appointment:   3 month(s)  The format for your next appointment:   In Person  Provider:   Debbe Odea, MD   Other Instructions  Atorvastatin tablets What is this medicine? ATORVASTATIN (a TORE va sta tin) is known as a HMG-CoA reductase inhibitor or 'statin'. It lowers the level of cholesterol and triglycerides in the blood. This drug may also reduce the risk of heart attack, stroke, or other health problems in patients with risk factors for heart disease. Diet and lifestyle changes are often used with this drug. This medicine may be used for other purposes; ask your health care provider or pharmacist if you have questions. COMMON BRAND NAME(S): Lipitor What should I tell my health care provider before I take this medicine? They need to know if you have any of these conditions:  diabetes  if you often drink alcohol  history of stroke  kidney disease  liver disease  muscle aches or weakness  thyroid disease  an unusual or allergic reaction to atorvastatin, other medicines, foods, dyes, or preservatives  pregnant or trying to get pregnant  breast-feeding How should I use this medicine? Take this medicine by mouth with a glass of water. Follow the directions on the prescription label. You can take it with or without food. If it upsets  your stomach, take it with food. Do not take with grapefruit juice. Take your medicine at regular intervals. Do not take it more often than directed. Do not stop taking except on your doctor's advice. Talk to your pediatrician regarding the use of this medicine in children. While this drug may be prescribed for children as young as 10 for selected conditions, precautions do apply. Overdosage: If you think you have taken too much of this medicine contact a poison control center or  emergency room at once. NOTE: This medicine is only for you. Do not share this medicine with others. What if I miss a dose? If you miss a dose, take it as soon as you can. If your next dose is to be taken in less than 12 hours, then do not take the missed dose. Take the next dose at your regular time. Do not take double or extra doses. What may interact with this medicine? Do not take this medicine with any of the following medications:  dasabuvir; ombitasvir; paritaprevir; ritonavir  ombitasvir; paritaprevir; ritonavir  posaconazole  red yeast rice This medicine may also interact with the following medications:  alcohol  birth control pills  certain antibiotics like erythromycin and clarithromycin  certain antivirals for HIV or hepatitis  certain medicines for cholesterol like fenofibrate, gemfibrozil, and niacin  certain medicines for fungal infections like ketoconazole and itraconazole  colchicine  cyclosporine  digoxin  grapefruit juice  rifampin This list may not describe all possible interactions. Give your health care provider a list of all the medicines, herbs, non-prescription drugs, or dietary supplements you use. Also tell them if you smoke, drink alcohol, or use illegal drugs. Some items may interact with your medicine. What should I watch for while using this medicine? Visit your doctor or health care professional for regular check-ups. You may need regular tests to make sure your liver is working properly. Your health care professional may tell you to stop taking this medicine if you develop muscle problems. If your muscle problems do not go away after stopping this medicine, contact your health care professional. Do not become pregnant while taking this medicine. Women should inform their health care professional if they wish to become pregnant or think they might be pregnant. There is a potential for serious side effects to an unborn child. Talk to your health  care professional or pharmacist for more information. Do not breast-feed an infant while taking this medicine. This medicine may increase blood sugar. Ask your healthcare provider if changes in diet or medicines are needed if you have diabetes. If you are going to need surgery or other procedure, tell your doctor that you are using this medicine. This drug is only part of a total heart-health program. Your doctor or a dietician can suggest a low-cholesterol and low-fat diet to help. Avoid alcohol and smoking, and keep a proper exercise schedule. This medicine may cause a decrease in Co-Enzyme Q-10. You should make sure that you get enough Co-Enzyme Q-10 while you are taking this medicine. Discuss the foods you eat and the vitamins you take with your health care professional. What side effects may I notice from receiving this medicine? Side effects that you should report to your doctor or health care professional as soon as possible:  allergic reactions like skin rash, itching or hives, swelling of the face, lips, or tongue  fever  joint pain  loss of memory  redness, blistering, peeling or loosening of the skin, including inside the mouth  signs and symptoms of high blood sugar such as being more thirsty or hungry or having to urinate more than normal. You may also feel very tired or have blurry vision.  signs and symptoms of liver injury like dark yellow or brown urine; general ill feeling or flu-like symptoms; light-belly pain; unusually weak or tired; yellowing of the eyes or skin  signs and symptoms of muscle injury like dark urine; trouble passing urine or change in the amount of urine; unusually weak or tired; muscle pain or side or back pain Side effects that usually do not require medical attention (report to your doctor or health care professional if they continue or are bothersome):  diarrhea  nausea  stomach pain  trouble sleeping  upset stomach This list may not describe  all possible side effects. Call your doctor for medical advice about side effects. You may report side effects to FDA at 1-800-FDA-1088. Where should I keep my medicine? Keep out of the reach of children. Store between 20 and 25 degrees C (68 and 77 degrees F). Throw away any unused medicine after the expiration date. NOTE: This sheet is a summary. It may not cover all possible information. If you have questions about this medicine, talk to your doctor, pharmacist, or health care provider.  2020 Elsevier/Gold Standard (2018-06-12 11:36:16)

## 2020-04-05 NOTE — Progress Notes (Signed)
Cardiology Office Note:    Date:  04/05/2020   ID:  Renee Bridges, DOB 1946/03/27, MRN [id number]  PCP:  [name], DO  [facility] Cardiologist:  No primary care provider on file.  [facility] Electrophysiologist:  None   Referring MD: [name], DO   Chief Complaint  Patient presents with  . New Patient (Initial Visit)    self referral/needs paperwork filled out for DMV/wants to get established w cardiologist. Meds reviewed verbally with patient.     History of Present Illness:    Renee Bridges is a 74 y.o. female with a hx of hyperlipidemia, anxiety who presents to establish care and also needs paperwork filled.  Patient states being stressed of late.  Whenever she visits primary care provider or grandson, she gets really upset and vomits.  She otherwise feels well.  She denies any history of chest pain, shortness of breath, palpitations.  He denies any history of heart disease.  Denies syncope.  She performs all her activities of daily living without any symptoms of chest pain or shortness of breath.  She states being under a lot of stress lately, takes Seroquel but does not know why.  She lost her daughter in 2019 and has been on much stress since.  Past Medical History:  Diagnosis Date  . Anxiety   . Arthritis   . Asthma   . Complication of surgical procedure 03/08/2018  . Constipation 07/17/2016  . Depression   . Headache   . History of hiatal hernia   . Hypertension   . Immobility 09/25/2016  . Kidney cyst, acquired   . Seizures (HCC)    hx. as child  . Shortness of breath dyspnea    with excertion    Past Surgical History:  Procedure Laterality Date  . ABDOMINAL HYSTERECTOMY    . BACK SURGERY    . colonoscopy a year ago    . LEG SURGERY Left 03/2015  . mass removed from back - 20 years ago    . SPINAL CORD STIMULATOR INSERTION N/A 01/17/2017   Procedure: LUMBAR SPINAL CORD STIMULATOR INSERTION;  Surgeon: Venita Lick, MD;  Location: [facility] OR;   Service: Orthopedics;  Laterality: N/A;  Requests 3 hrs  . TONSILLECTOMY    . TOTAL KNEE REVISION Left 05/13/2015   Procedure: REVISION LEFT  KNEE ARTHROPLASTY, REVISION PATELLA POLY EXCHANGE;  Surgeon: Samson Frederic, MD;  Location: [facility] ORS;  Service: Orthopedics;  Laterality: Left;    Current Medications: Current Meds  Medication Sig  . QUEtiapine (SEROQUEL XR) 50 MG TB24 24 hr tablet Take 1 tablet (50 mg total) by mouth at bedtime.     Allergies:   Bactrim [sulfamethoxazole-trimethoprim], Pollen extract, and Codeine   Social History   Socioeconomic History  . Marital status: Divorced    Spouse name: Not on file  . Number of children: Not on file  . Years of education: Not on file  . Highest education level: Not on file  Occupational History  . Not on file  Tobacco Use  . Smoking status: Never Smoker  . Smokeless tobacco: Never Used  Vaping Use  . Vaping Use: Never used  Substance and Sexual Activity  . Alcohol use: No    Alcohol/week: 0.0 standard drinks  . Drug use: No  . Sexual activity: Never  Other Topics Concern  . Not on file  Social History Narrative  . Not on file   Social Determinants of Health   Financial Resource  Strain:   . Difficulty of Paying Living Expenses:   Food Insecurity: No Food Insecurity  . Worried About Programme researcher, broadcasting/film/video in the Last Year: Never true  . Ran Out of Food in the Last Year: Never true  Transportation Needs: Unmet Transportation Needs  . Lack of Transportation (Medical): Yes  . Lack of Transportation (Non-Medical): Yes  Physical Activity:   . Days of Exercise per Week:   . Minutes of Exercise per Session:   Stress: Stress Concern Present  . Feeling of Stress : To some extent  Social Connections:   . Frequency of Communication with Friends and Family:   . Frequency of Social Gatherings with Friends and Family:   . Attends Religious Services:   . Active Member of Clubs or Organizations:   . Attends Banker  Meetings:   Marland Kitchen Marital Status:      Family History: The patient's family history includes Cancer in her brother, brother, father, and mother; Diabetes in her brother.  ROS:   Please see the history of present illness.     All other systems reviewed and are negative.  EKGs/Labs/Other Studies Reviewed:    The following studies were reviewed today:   EKG:  EKG is  ordered today.  The ekg ordered today demonstrates normal sinus rhythm  Recent Labs: 01/12/2020: ALT 16; BUN 13; Creatinine, Ser 1.17; Hemoglobin 15.5; Platelets 232; Potassium 4.7; Sodium 139; TSH 2.850  Recent Lipid Panel    Component Value Date/Time   CHOL 219 (H) 01/12/2020 1024   TRIG 160 (H) 01/12/2020 1024   HDL 43 01/12/2020 1024   LDLCALC 147 (H) 01/12/2020 1024    Physical Exam:    VS:  BP (!) 112/86 (BP Location: Right Arm, Patient Position: Sitting, Cuff Size: Normal)   Pulse 66   Ht 5\' 3"  (1.6 m)   Wt 169 lb (76.7 kg)   LMP  (LMP Unknown)   SpO2 95%   BMI 29.94 kg/m     Wt Readings from Last 3 Encounters:  04/05/20 169 lb (76.7 kg)  02/27/20 169 lb 9.6 oz (76.9 kg)  01/12/20 175 lb (79.4 kg)     GEN:  Well nourished, well developed in no acute distress HEENT: Normal NECK: No JVD; No carotid bruits LYMPHATICS: No lymphadenopathy CARDIAC: RRR, no murmurs, rubs, gallops RESPIRATORY:  Clear to auscultation without rales, wheezing or rhonchi  ABDOMEN: Soft, non-tender, non-distended MUSCULOSKELETAL:  No edema; No deformity  SKIN: Warm and dry NEUROLOGIC:  Alert and oriented x 3 PSYCHIATRIC:  Normal affect   ASSESSMENT:    1. Pure hypercholesterolemia    PLAN:    In order of problems listed above:  1. Patient with hyperlipidemia.  10-year ASCVD risk score is 13.9.  Will start Lipitor 20 mg daily.  Get fasting lipid profile in 3 months.  Follow-up after.  Patient also has paperwork which she wants filled regarding cardiac status, driving.  Follow-up after fasting lipid profile.  Total  encounter time 60 minutes  Greater than 50% was spent in counseling and coordination of care with the patient Time also spent filling paperwork for patient regarding driving.   Medication Adjustments/Labs and Tests Ordered: Current medicines are reviewed at length with the patient today.  Concerns regarding medicines are outlined above.  Orders Placed This Encounter  Procedures  . Lipid Profile  . EKG 12-Lead   Meds ordered this encounter  Medications  . atorvastatin (LIPITOR) 20 MG tablet    Sig: Take 1  tablet (20 mg total) by mouth daily.    Dispense:  30 tablet    Refill:  3    Patient Instructions  Medication Instructions:   Your physician has recommended you make the following change in your medication:   1.  START taking atorvastatin (LIPITOR): Take 1 tablet (20 mg total) by mouth daily.  *If you need a refill on your cardiac medications before your next appointment, please call your pharmacy*   Lab Work: Your physician recommends that you return for a FASTING lipid profile: Within a week of your 3 month follow up appointment. - You will need to be fasting. Please do not have anything to eat or drink after midnight the morning you have the lab work. You may only have water or black coffee with no cream or sugar. - Please go to the [facility]. You will check in at the front desk to the right as you walk into the atrium. Valet Parking is offered if needed. - No appointment needed. You may go any day between 7 am and 6 pm.  If you have labs (blood work) drawn today and your tests are completely normal, you will receive your results only by: Marland Kitchen MyChart Message (if you have MyChart) OR . A paper copy in the mail If you have any lab test that is abnormal or we need to change your treatment, we will call you to review the results.   Testing/Procedures: None Ordered.   Follow-Up: At [facility], you and your health needs are our priority.  As part of our  continuing mission to provide you with exceptional heart care, we have created designated Provider Care Teams.  These Care Teams include your primary Cardiologist (physician) and Advanced Practice Providers (APPs -  Physician Assistants and Nurse Practitioners) who all work together to provide you with the care you need, when you need it.  We recommend signing up for the patient portal called "MyChart".  Sign up information is provided on this After Visit Summary.  MyChart is used to connect with patients for Virtual Visits (Telemedicine).  Patients are able to view lab/test results, encounter notes, upcoming appointments, etc.  Non-urgent messages can be sent to your provider as well.   To learn more about what you can do with MyChart, go to [email or web address].    Your next appointment:   3 month(s)  The format for your next appointment:   In Person  Provider:   Debbe Odea, MD   Other Instructions  Atorvastatin tablets What is this medicine? ATORVASTATIN (a TORE va sta tin) is known as a HMG-CoA reductase inhibitor or 'statin'. It lowers the level of cholesterol and triglycerides in the blood. This drug may also reduce the risk of heart attack, stroke, or other health problems in patients with risk factors for heart disease. Diet and lifestyle changes are often used with this drug. This medicine may be used for other purposes; ask your health care provider or pharmacist if you have questions. COMMON BRAND NAME(S): Lipitor What should I tell my health care provider before I take this medicine? They need to know if you have any of these conditions:  diabetes  if you often drink alcohol  history of stroke  kidney disease  liver disease  muscle aches or weakness  thyroid disease  an unusual or allergic reaction to atorvastatin, other medicines, foods, dyes, or preservatives  pregnant or trying to get pregnant  breast-feeding How should I use this medicine? Take  this medicine by mouth with a glass of water. Follow the directions on the prescription label. You can take it with or without food. If it upsets your stomach, take it with food. Do not take with grapefruit juice. Take your medicine at regular intervals. Do not take it more often than directed. Do not stop taking except on your doctor's advice. Talk to your pediatrician regarding the use of this medicine in children. While this drug may be prescribed for children as young as 10 for selected conditions, precautions do apply. Overdosage: If you think you have taken too much of this medicine contact a poison control center or emergency room at once. NOTE: This medicine is only for you. Do not share this medicine with others. What if I miss a dose? If you miss a dose, take it as soon as you can. If your next dose is to be taken in less than 12 hours, then do not take the missed dose. Take the next dose at your regular time. Do not take double or extra doses. What may interact with this medicine? Do not take this medicine with any of the following medications:  dasabuvir; ombitasvir; paritaprevir; ritonavir  ombitasvir; paritaprevir; ritonavir  posaconazole  red yeast rice This medicine may also interact with the following medications:  alcohol  birth control pills  certain antibiotics like erythromycin and clarithromycin  certain antivirals for HIV or hepatitis  certain medicines for cholesterol like fenofibrate, gemfibrozil, and niacin  certain medicines for fungal infections like ketoconazole and itraconazole  colchicine  cyclosporine  digoxin  grapefruit juice  rifampin This list may not describe all possible interactions. Give your health care provider a list of all the medicines, herbs, non-prescription drugs, or dietary supplements you use. Also tell them if you smoke, drink alcohol, or use illegal drugs. Some items may interact with your medicine. What should I watch for  while using this medicine? Visit your doctor or health care professional for regular check-ups. You may need regular tests to make sure your liver is working properly. Your health care professional may tell you to stop taking this medicine if you develop muscle problems. If your muscle problems do not go away after stopping this medicine, contact your health care professional. Do not become pregnant while taking this medicine. Women should inform their health care professional if they wish to become pregnant or think they might be pregnant. There is a potential for serious side effects to an unborn child. Talk to your health care professional or pharmacist for more information. Do not breast-feed an infant while taking this medicine. This medicine may increase blood sugar. Ask your healthcare provider if changes in diet or medicines are needed if you have diabetes. If you are going to need surgery or other procedure, tell your doctor that you are using this medicine. This drug is only part of a total heart-health program. Your doctor or a dietician can suggest a low-cholesterol and low-fat diet to help. Avoid alcohol and smoking, and keep a proper exercise schedule. This medicine may cause a decrease in Co-Enzyme Q-10. You should make sure that you get enough Co-Enzyme Q-10 while you are taking this medicine. Discuss the foods you eat and the vitamins you take with your health care professional. What side effects may I notice from receiving this medicine? Side effects that you should report to your doctor or health care professional as soon as possible:  allergic reactions like skin rash, itching or hives, swelling of the face,  lips, or tongue  fever  joint pain  loss of memory  redness, blistering, peeling or loosening of the skin, including inside the mouth  signs and symptoms of high blood sugar such as being more thirsty or hungry or having to urinate more than normal. You may also feel very  tired or have blurry vision.  signs and symptoms of liver injury like dark yellow or brown urine; general ill feeling or flu-like symptoms; light-belly pain; unusually weak or tired; yellowing of the eyes or skin  signs and symptoms of muscle injury like dark urine; trouble passing urine or change in the amount of urine; unusually weak or tired; muscle pain or side or back pain Side effects that usually do not require medical attention (report to your doctor or health care professional if they continue or are bothersome):  diarrhea  nausea  stomach pain  trouble sleeping  upset stomach This list may not describe all possible side effects. Call your doctor for medical advice about side effects. You may report side effects to FDA at 1-800-FDA-1088. Where should I keep my medicine? Keep out of the reach of children. Store between 20 and 25 degrees C (68 and 77 degrees F). Throw away any unused medicine after the expiration date. NOTE: This sheet is a summary. It may not cover all possible information. If you have questions about this medicine, talk to your doctor, pharmacist, or health care provider.  2020 Elsevier/Gold Standard (2018-06-12 11:36:16)      Signed, Debbe OdeaBrian Agbor-Etang, MD  04/05/2020 12:46 PM     [facility]

## 2020-04-07 ENCOUNTER — Telehealth: Payer: Medicaid Other

## 2020-04-07 ENCOUNTER — Telehealth: Payer: Self-pay | Admitting: Licensed Clinical Social Worker

## 2020-04-07 NOTE — Telephone Encounter (Signed)
LCSW completed CCM outreach attempt today but was unable to reach patient successfully. A HIPPA compliant voice message was left encouraging patient to return call once available. LCSW rescheduled CCM SW appointment as well.  Adaisha Campise, BSW, MSW, LCSW [facility] [place]  [facility] [email or web address] Phone: [phone]    

## 2020-04-13 ENCOUNTER — Telehealth: Payer: Medicaid Other | Admitting: General Practice

## 2020-04-13 ENCOUNTER — Ambulatory Visit (INDEPENDENT_AMBULATORY_CARE_PROVIDER_SITE_OTHER): Payer: Medicare Other | Admitting: General Practice

## 2020-04-13 DIAGNOSIS — E782 Mixed hyperlipidemia: Secondary | ICD-10-CM

## 2020-04-13 DIAGNOSIS — N183 Chronic kidney disease, stage 3 unspecified: Secondary | ICD-10-CM

## 2020-04-13 DIAGNOSIS — Z638 Other specified problems related to primary support group: Secondary | ICD-10-CM

## 2020-04-13 DIAGNOSIS — F339 Major depressive disorder, recurrent, unspecified: Secondary | ICD-10-CM

## 2020-04-13 DIAGNOSIS — F419 Anxiety disorder, unspecified: Secondary | ICD-10-CM

## 2020-04-13 DIAGNOSIS — Z9189 Other specified personal risk factors, not elsewhere classified: Secondary | ICD-10-CM

## 2020-04-13 NOTE — Patient Instructions (Signed)
Visit Information  Goals Addressed            This Visit's Progress    RNCM: Chronic Disease Managment and suppor       Current Barriers:   Chronic Disease Management support, education, and care coordination needs related to HTN, HLD, CKD Stage 3, and Depression  Clinical Goal(s) related to HTN, HLD, CKD Stage 3, and Depression:  Over the next 120 days, patient will:   Work with the care management team to address educational, disease management, and care coordination needs   Begin or continue self health monitoring activities as directed today Measure and record blood pressure 2 times per week  Call provider office for new or worsened signs and symptoms Blood pressure findings outside established parameters, New or worsened symptom related to HTN, HLD, Chronic Kidney Disease 3, and depression  Call care management team with questions or concerns  Verbalize basic understanding of patient centered plan of care established today  Interventions related to HTN, HLD, CKD Stage 3, and Depression:   Evaluation of current treatment plans and patient's adherence to plan as established by provider.  04-13-2020: The granddaughter feels the patient is not taking her medications as directed. The granddaughter further states that the patient is "worked up" right now and there is a lot of family drama going on with the patients grandson and the patient having to get paperwork filled out for the [facility] so she can drive. The granddaughter does not feel like the patient needs to be driving and is very concerned. The patient has been avoiding talking to the granddaughter and depending on her to help in her care.   Assessed patient understanding of disease states.  The granddaughter states she understands her chronic conditions but wants to talk to pcp about the patients mood changes and not acting age appropriate. The granddaughter is open to ideas and recommendations from the care team. 04/13/2020:  The  granddaughter continue to ask for recommendations on how to best help the patient. She is concerned about the patient driving. She feels if she is taking her medications as directed that it is not an issue; however if she goes out driving in a rage she is concerned that she will hurt herself or someone else.    Assessed patient's education and care coordination needs. 04-13-2020: Will touch base with the LCSW to follow up with the patient granddaughter for recommendations and help with anxiety and depression.  Evaluation of HTN/HLD/CKD/Depression/Anxiety: The patients granddaughter does not have any vital sign readings for the [facility]. Attempts to reach the patient were unsuccessful. Empathetic listening and support given. The granddaughter feels the patient is  not safe but does wish she would consider talking to a psychiatrist.  The patient has refused this in the past.   Provided disease specific education to patient. Education on help and resources to help with her depression and anxiety. The patient seems to be doing better since getting a smaller dog. The patient seems to be taking her medication at this time.   Collaborated with appropriate clinical care team members regarding patient needs.  Pharmacy and LCSW ongoing support and help with managing health and well being of the patient.   Patient Self Care Activities related to HTN, HLD, CKD Stage 3, and Depression:   Patient is unable to independently self-manage chronic health conditions  Please see past updates related to this goal by clicking on the "Past Updates" button in the selected goal  RNCM: Tell Dr. Laural BenesJohnson I stopped taking that other medicine. It makes me jittery       Current Barriers:   Knowledge Deficits related to understanding the significance of taking medications as prescribed for chronic medical conditions because the patient "feels good"  Lacks caregiver support. Daughter Andrey CampanileSandy Deceased x 2 years, Daughter Cordelia PenSherry  not involved, has a granddaughter Shanda BumpsJessica that helps when she can  Literacy barriers  Transportation barriers  Non-adherence to scheduled provider appointments  Non-adherence to prescribed medication regimen  Cognitive Deficits  Chronic Disease Management support and education needs related to HTN, HLD, Chronic Kidney Disease stage 3 and Depression  Nurse Case Manager Clinical Goal(s):   Over the next 120 days, patient will work with pcp and CCM team  to address needs related to management of chronic disease processes and taking prescribed medications as directed   Over the next 120 days, patient will demonstrate a decrease in depression  exacerbations as evidenced by of the patient taking medications as directed  Over the next 120 days, patient will demonstrate improved health management independence as evidenced by following recommendations by the care team to help the patient with her health and well being  Over the next 90 days, patient will work with CM team pharmacist to manage medications and compliance concerns  Over the next 90 days, patient will work with CM clinical social worker to gain effective coping skills for her depression and the loss of her daughter 2 years ago  Over the next 90 days, patient will work with care guide team to assist with low income housing closer to her granddaughter in [place]  Over the next 90 days, the patient will demonstrate ongoing self health care management ability as evidenced by following the recommendations of the pcp and CCM team  Interventions:   Evaluation of current treatment plan related to HTN, HLD, Chronic Kidney disease, and depression and patient's adherence to plan as established by provider.  Advised patient's granddaughter of appointment scheduled on 04-15-2020.  The patients granddaughter is going to try to bring the patient to her appointment  Reviewed medications with patient and discussed noncompliance of medication  regimen and the need to take medications as directed. 04-13-2020: The patients granddaughter states that the patient tells her she is taking her medication as directed but she feels the patient is lying to her about it. RNCM attempted to call the patient to discuss her health and wellness but the patient did not answer. RNCM left a message.   Collaborated with pcp and CCM team  regarding noncompliance of treatment regimen for chronic disease management.  04-13-2020: This is an ongoing issue with the patient per the patients granddaughter.   Discussed plans with patient for ongoing care management follow up and provided patient with direct contact information for care management team  Care Guide referral for assistance in finding low income housing in [place] so the patient can be closer to her granddaughter.  02-24-2020: the granddaughter verbalized she has checked into some places closer to her but the price is out of range. She is not sure she would move or not. The patient purchased a Zenaida Niecevan and has not told the granddaughter. The granddaughter says she has had a Zenaida Niecevan about 3 months. [name]'s brother Clide CliffRicky finally told her that the patient was allowing him to use it at this time until he could get his own transportation. Shanda BumpsJessica states that the patient has not been talking to her much and has been avoiding her  for about 3 weeks. 04-13-2020: The patients granddaughter states that she does not know how much longer she can help her grandmother in current state. She does not feel she is safe to drive and she is not taking her medications. She gets very easily upset.  Social Work referral for depression and multiple issues related to grief and loss  Pharmacy referral for noncompliance to prescribed medication regimen- the patient confirmed she is not taking any medications because she "feels good" and she stopped taking that "other medicine" because it made her feel "jittery" (medication fluoxetine for her  depression)  Patient Self Care Activities:   Patient verbalizes understanding of plan to see the pcp in the next 30 days to evaluate health and wellbeing  Self administers medications as prescribed  Attends all scheduled provider appointments  Calls provider office for new concerns or questions  Unable to independently manage chronic disease processes   Unable to self administer medications as prescribed  Does not adhere to provider recommendations re: care plan and taking medications as prescribed   Lacks social connections  Please see past updates related to this goal by clicking on the "Past Updates" button in the selected goal         Patient verbalizes understanding of instructions provided today.   Telephone follow up appointment with care management team member scheduled for: 05-18-2020 at 4 pm  Alto Denver RN, MSN, CCM Community Care Coordinator [place]   [facility] Mobile: [phone]   Mindfulness-Based Stress Reduction Mindfulness-based stress reduction (MBSR) is a program that helps people learn to practice mindfulness. Mindfulness is the practice of intentionally paying attention to the present moment. It can be learned and practiced through techniques such as education, breathing exercises, meditation, and yoga. MBSR includes several mindfulness techniques in one program. MBSR works best when you understand the treatment, are willing to try new things, and can commit to spending time practicing what you learn. MBSR training may include learning about: How your emotions, thoughts, and reactions affect your body. New ways to respond to things that cause negative thoughts to start (triggers). How to notice your thoughts and let go of them. Practicing awareness of everyday things that you normally do without thinking. The techniques and goals of different types of meditation. What are the benefits of MBSR? MBSR can have  many benefits, which include helping you to: Develop self-awareness. This refers to knowing and understanding yourself. Learn skills and attitudes that help you to participate in your own health care. Learn new ways to care for yourself. Be more accepting about how things are, and let things go. Be less judgmental and approach things with an open mind. Be patient with yourself and trust yourself more. MBSR has also been shown to: Reduce negative emotions, such as depression and anxiety. Improve memory and focus. Change how you sense and approach pain. Boost your body's ability to fight infections. Help you connect better with other people. Improve your sense of well-being. Follow these instructions at home:  Find a local in-person or online MBSR program. Set aside some time regularly for mindfulness practice. Find a mindfulness practice that works best for you. This may include one or more of the following: Meditation. Meditation involves focusing your mind on a certain thought or activity. Breathing awareness exercises. These help you to stay present by focusing on your breath. Body scan. For this practice, you lie down and pay attention to each part of your body from head  to toe. You can identify tension and soreness and intentionally relax parts of your body. Yoga. Yoga involves stretching and breathing, and it can improve your ability to move and be flexible. It can also provide an experience of testing your body's limits, which can help you release stress. Mindful eating. This way of eating involves focusing on the taste, texture, color, and smell of each bite of food. Because this slows down eating and helps you feel full sooner, it can be an important part of a weight-loss plan. Find a podcast or recording that provides guidance for breathing awareness, body scan, or meditation exercises. You can listen to these any time when you have a free moment to rest without distractions. Follow  your treatment plan as told by your health care provider. This may include taking regular medicines and making changes to your diet or lifestyle as recommended. How to practice mindfulness To do a basic awareness exercise: Find a comfortable place to sit. Pay attention to the present moment. Observe your thoughts, feelings, and surroundings just as they are. Avoid placing judgment on yourself, your feelings, or your surroundings. Make note of any judgment that comes up, and let it go. Your mind may wander, and that is okay. Make note of when your thoughts drift, and return your attention to the present moment. To do basic mindfulness meditation: Find a comfortable place to sit. This may include a stable chair or a firm floor cushion. Sit upright with your back straight. Let your arms fall next to your side with your hands resting on your legs. If sitting in a chair, rest your feet flat on the floor. If sitting on a cushion, cross your legs in front of you. Keep your head in a neutral position with your chin dropped slightly. Relax your jaw and rest the tip of your tongue on the roof of your mouth. Drop your gaze to the floor. You can close your eyes if you like. Breathe normally and pay attention to your breath. Feel the air moving in and out of your nose. Feel your belly expanding and relaxing with each breath. Your mind may wander, and that is okay. Make note of when your thoughts drift, and return your attention to your breath. Avoid placing judgment on yourself, your feelings, or your surroundings. Make note of any judgment or feelings that come up, let them go, and bring your attention back to your breath. When you are ready, lift your gaze or open your eyes. Pay attention to how your body feels after the meditation. Where to find more information You can find more information about MBSR from: Your health care provider. Community-based meditation centers or programs. Programs offered near  you. Summary Mindfulness-based stress reduction (MBSR) is a program that teaches you how to intentionally pay attention to the present moment. It is used with other treatments to help you cope better with daily stress, emotions, and pain. MBSR focuses on developing self-awareness, which allows you to respond to life stress without judgment or negative emotions. MBSR programs may involve learning different mindfulness practices, such as breathing exercises, meditation, yoga, body scan, or mindful eating. Find a mindfulness practice that works best for you, and set aside time for it on a regular basis. This information is not intended to replace advice given to you by your health care provider. Make sure you discuss any questions you have with your health care provider. Document Revised: 08/03/2017 Document Reviewed: 12/28/2016 Elsevier Patient Education  2020 [employer].

## 2020-04-13 NOTE — Chronic Care Management (AMB) (Signed)
Chronic Care Management   Follow Up Note   04/13/2020 Name: Renee Bridges MRN: [id number] DOB: 1946/07/03  Referred by: Renee Carrow, DO Reason for referral : Chronic Care Management (Follow up appointmentL Chronic Disease Management and Care Coordination needs)   Renee Bridges is a 74 y.o. year old female who is a primary care patient of Renee Carrow, DO. The CCM team was consulted for assistance with chronic disease management and care coordination needs.    Review of patient status, including review of consultants reports, relevant laboratory and other test results, and collaboration with appropriate care team members and the patient's provider was performed as part of comprehensive patient evaluation and provision of chronic care management services.    SDOH (Social Determinants of Health) assessments performed: Yes See Care Plan activities for detailed interventions related to [facility])     Outpatient Encounter Medications as of 04/13/2020  Medication Sig  . atorvastatin (LIPITOR) 20 MG tablet Take 1 tablet (20 mg total) by mouth daily.  . QUEtiapine (SEROQUEL XR) 50 MG TB24 24 hr tablet Take 1 tablet (50 mg total) by mouth at bedtime.   No facility-administered encounter medications on file as of 04/13/2020.     Objective:  BP Readings from Last 3 Encounters:  04/05/20 (!) 112/86  02/27/20 125/86  01/12/20 (!) 144/82    Goals Addressed            This Visit's Progress   . RNCM: Chronic Disease Managment and suppor       Current Barriers:  . Chronic Disease Management support, education, and care coordination needs related to HTN, HLD, CKD Stage 3, and Depression  Clinical Goal(s) related to HTN, HLD, CKD Stage 3, and Depression:  Over the next 120 days, patient will:  . Work with the care management team to address educational, disease management, and care coordination needs  . Begin or continue self health monitoring activities as directed today Measure and  record blood pressure 2 times per week . Call provider office for new or worsened signs and symptoms Blood pressure findings outside established parameters, New or worsened symptom related to HTN, HLD, Chronic Kidney Disease 3, and depression . Call care management team with questions or concerns . Verbalize basic understanding of patient centered plan of care established today  Interventions related to HTN, HLD, CKD Stage 3, and Depression:  . Evaluation of current treatment plans and patient's adherence to plan as established by provider.  04-13-2020: The granddaughter feels the patient is not taking her medications as directed. The granddaughter further states that the patient is "worked up" right now and there is a lot of family drama going on with the patients grandson and the patient having to get paperwork filled out for the [facility] so she can drive. The granddaughter does not feel like the patient needs to be driving and is very concerned. The patient has been avoiding talking to the granddaughter and depending on her to help in her care.  . Assessed patient understanding of disease states.  The granddaughter states she understands her chronic conditions but wants to talk to pcp about the patients mood changes and not acting age appropriate. The granddaughter is open to ideas and recommendations from the care team. 04/13/2020:  The granddaughter continue to ask for recommendations on how to best help the patient. She is concerned about the patient driving. She feels if she is taking her medications as directed that it is not an issue; however if  she goes out driving in a rage she is concerned that she will hurt herself or someone else.   . Assessed patient's education and care coordination needs. 04-13-2020: Will touch base with the LCSW to follow up with the patient granddaughter for recommendations and help with anxiety and depression. . Evaluation of HTN/HLD/CKD/Depression/Anxiety: The patients  granddaughter does not have any vital sign readings for the [facility]. Attempts to reach the patient were unsuccessful. Empathetic listening and support given. The granddaughter feels the patient is  not safe but does wish she would consider talking to a psychiatrist.  The patient has refused this in the past.  . Provided disease specific education to patient. Education on help and resources to help with her depression and anxiety. The patient seems to be doing better since getting a smaller dog. The patient seems to be taking her medication at this time.  [name] with appropriate clinical care team members regarding patient needs.  Pharmacy and LCSW ongoing support and help with managing health and well being of the patient.   Patient Self Care Activities related to HTN, HLD, CKD Stage 3, and Depression:  . Patient is unable to independently self-manage chronic health conditions  Please see past updates related to this goal by clicking on the "Past Updates" button in the selected goal      . RNCM: Tell Dr. Laural Benes I stopped taking that other medicine. It makes me jittery       Current Barriers:  Marland Kitchen Knowledge Deficits related to understanding the significance of taking medications as prescribed for chronic medical conditions because the patient "feels good" . Lacks caregiver support. Daughter Renee Bridges Deceased x 2 years, Daughter Renee Bridges not involved, has a granddaughter Renee Bridges that helps when she can . Literacy barriers . Transportation barriers . Non-adherence to scheduled provider appointments . Non-adherence to prescribed medication regimen . Cognitive Deficits . Chronic Disease Management support and education needs related to HTN, HLD, Chronic Kidney Disease stage 3 and Depression  Nurse Case Manager Clinical Goal(s):  Marland Kitchen Over the next 120 days, patient will work with pcp and CCM team  to address needs related to management of chronic disease processes and taking prescribed medications as  directed  . Over the next 120 days, patient will demonstrate a decrease in depression  exacerbations as evidenced by of the patient taking medications as directed . Over the next 120 days, patient will demonstrate improved health management independence as evidenced by following recommendations by the care team to help the patient with her health and well being . Over the next 90 days, patient will work with CM team pharmacist to manage medications and compliance concerns . Over the next 90 days, patient will work with CM clinical social worker to gain effective coping skills for her depression and the loss of her daughter 2 years ago . Over the next 90 days, patient will work with care guide team to assist with low income housing closer to her granddaughter in [place] . Over the next 90 days, the patient will demonstrate ongoing self health care management ability as evidenced by following the recommendations of the pcp and CCM team  Interventions:  . Evaluation of current treatment plan related to HTN, HLD, Chronic Kidney disease, and depression and patient's adherence to plan as established by provider. . Advised patient's granddaughter of appointment scheduled on 04-15-2020.  The patients granddaughter is going to try to bring the patient to her appointment . Reviewed medications with patient and discussed noncompliance of  medication regimen and the need to take medications as directed. 04-13-2020: The patients granddaughter states that the patient tells her she is taking her medication as directed but she feels the patient is lying to her about it. RNCM attempted to call the patient to discuss her health and wellness but the patient did not answer. RNCM left a message.  Marland Kitchen Collaborated with pcp and CCM team  regarding noncompliance of treatment regimen for chronic disease management.  04-13-2020: This is an ongoing issue with the patient per the patients granddaughter.  . Discussed plans with  patient for ongoing care management follow up and provided patient with direct contact information for care management team . Care Guide referral for assistance in finding low income housing in [place] so the patient can be closer to her granddaughter.  02-24-2020: the granddaughter verbalized she has checked into some places closer to her but the price is out of range. She is not sure she would move or not. The patient purchased a Zenaida Niece and has not told the granddaughter. The granddaughter says she has had a Zenaida Niece about 3 months. Renee Bridges's brother Renee Bridges finally told her that the patient was allowing him to use it at this time until he could get his own transportation. Renee Bridges states that the patient has not been talking to her much and has been avoiding her for about 3 weeks. 04-13-2020: The patients granddaughter states that she does not know how much longer she can help her grandmother in current state. She does not feel she is safe to drive and she is not taking her medications. She gets very easily upset. . Social Work referral for depression and multiple issues related to grief and loss . Pharmacy referral for noncompliance to prescribed medication regimen- the patient confirmed she is not taking any medications because she "feels good" and she stopped taking that "other medicine" because it made her feel "jittery" (medication fluoxetine for her depression)  Patient Self Care Activities:  . Patient verbalizes understanding of plan to see the pcp in the next 30 days to evaluate health and wellbeing . Self administers medications as prescribed . Attends all scheduled provider appointments . Calls provider office for new concerns or questions . Unable to independently manage chronic disease processes  . Unable to self administer medications as prescribed . Does not adhere to provider recommendations re: care plan and taking medications as prescribed  . Lacks social connections  Please see past updates  related to this goal by clicking on the "Past Updates" button in the selected goal          Plan:   Telephone follow up appointment with care management team member scheduled for: 05-18-2020 at 4 pm. Call granddaughter if can not reach the patient.    Alto Denver RN, MSN, CCM Community Care Coordinator [place]  [facility] Mobile: [phone]

## 2020-04-15 ENCOUNTER — Telehealth (INDEPENDENT_AMBULATORY_CARE_PROVIDER_SITE_OTHER): Payer: Medicare Other | Admitting: Family Medicine

## 2020-04-15 ENCOUNTER — Ambulatory Visit: Payer: Medicare Other | Admitting: Family Medicine

## 2020-04-15 ENCOUNTER — Encounter: Payer: Self-pay | Admitting: Family Medicine

## 2020-04-15 VITALS — Wt 169.0 lb

## 2020-04-15 DIAGNOSIS — F419 Anxiety disorder, unspecified: Secondary | ICD-10-CM

## 2020-04-15 DIAGNOSIS — E782 Mixed hyperlipidemia: Secondary | ICD-10-CM | POA: Diagnosis not present

## 2020-04-15 DIAGNOSIS — I129 Hypertensive chronic kidney disease with stage 1 through stage 4 chronic kidney disease, or unspecified chronic kidney disease: Secondary | ICD-10-CM

## 2020-04-15 DIAGNOSIS — F339 Major depressive disorder, recurrent, unspecified: Secondary | ICD-10-CM | POA: Diagnosis not present

## 2020-04-15 DIAGNOSIS — R4182 Altered mental status, unspecified: Secondary | ICD-10-CM

## 2020-04-15 NOTE — Progress Notes (Signed)
Wt 169 lb (76.7 kg)   LMP  (LMP Unknown)   BMI 29.94 kg/m    Subjective:    Patient ID: Renee Bridges, female    DOB: 12/09/1945, 74 y.o.   MRN: [id number]  HPI: Renee Bridges is a 74 y.o. female  Chief Complaint  Patient presents with  . Hypertension  . Hyperlipidemia   HYPERTENSION / HYPERLIPIDEMIA Satisfied with current treatment? yes Duration of hypertension: chronic BP monitoring frequency: not checking BP medication side effects: no Past BP meds: none Duration of hyperlipidemia: chronic Cholesterol medication side effects: no Cholesterol supplements: none Past cholesterol medications: atorvastatin Medication compliance: excellent compliance Aspirin: no Recent stressors: yes Recurrent headaches: no Visual changes: no Palpitations: no Dyspnea: no Chest pain: no Lower extremity edema: no Dizzy/lightheaded: no  ANXIETY/STRESS Duration: chronic Status:stable Anxious mood: yes  Excessive worrying: yes Irritability: yes  Sweating: no Nausea: no Palpitations:yes Hyperventilation: no Panic attacks: yes Agoraphobia: no  Obscessions/compulsions: no Depressed mood: yes Depression screen [facility] 2/9 02/27/2020 12/15/2019 11/21/2019 06/27/2019 05/22/2019  Decreased Interest 0 0 0 1 1  Down, Depressed, Hopeless 1 0 0 2 0  PHQ - 2 Score 1 0 0 3 1  Altered sleeping 3 2 3 3 3   Tired, decreased energy 3 0 0 3 1  Change in appetite 1 0 2 3 3   Feeling bad or failure about yourself  0 0 0 0 0  Trouble concentrating 0 0 0 0 0  Moving slowly or fidgety/restless 3 2 2  0 0  Suicidal thoughts 0 0 0 0 0  PHQ-9 Score 11 4 7 12 8   Difficult doing work/chores Very difficult Very difficult Somewhat difficult Very difficult Somewhat difficult  Some recent data might be hidden   Anhedonia: no Weight changes: no Insomnia: yes hard to fall asleep  Hypersomnia: no Fatigue/loss of energy: yes Feelings of worthlessness: yes Feelings of guilt: yes Impaired  concentration/indecisiveness: no Suicidal ideations: no  Crying spells: yes Recent Stressors/Life Changes: yes   Relationship problems: yes   Family stress: yes     Financial stress: yes    Job stress: no    Recent death/loss: yes  Relevant past medical, surgical, family and social history reviewed and updated as indicated. Interim medical history since our last visit reviewed. Allergies and medications reviewed and updated.  Review of Systems  Constitutional: Negative.   Respiratory: Negative.   Cardiovascular: Negative.   Gastrointestinal: Positive for abdominal pain and nausea. Negative for abdominal distention, anal bleeding, blood in stool, constipation, diarrhea, rectal pain and vomiting.  Musculoskeletal: Negative.   Skin: Negative.   Neurological: Negative.   Psychiatric/Behavioral: Positive for agitation, behavioral problems, confusion and dysphoric mood. Negative for decreased concentration, hallucinations, self-injury, sleep disturbance and suicidal ideas. The patient is nervous/anxious. The patient is not hyperactive.     Per HPI unless specifically indicated above     Objective:    Wt 169 lb (76.7 kg)   LMP  (LMP Unknown)   BMI 29.94 kg/m   Wt Readings from Last 3 Encounters:  04/15/20 169 lb (76.7 kg)  04/05/20 169 lb (76.7 kg)  02/27/20 169 lb 9.6 oz (76.9 kg)    Physical Exam Vitals and nursing note reviewed.  Constitutional:      General: She is not in acute distress.    Appearance: Normal appearance. She is not ill-appearing, toxic-appearing or diaphoretic.  HENT:     Head: Normocephalic and atraumatic.     Right Ear: External ear normal.  Left Ear: External ear normal.     Nose: Nose normal.     Mouth/Throat:     Mouth: Mucous membranes are moist.     Pharynx: Oropharynx is clear.  Eyes:     General: No scleral icterus.       Right eye: No discharge.        Left eye: No discharge.     Conjunctiva/sclera: Conjunctivae normal.     Pupils:  Pupils are equal, round, and reactive to light.  Pulmonary:     Effort: Pulmonary effort is normal. No respiratory distress.     Comments: Speaking in full sentences Musculoskeletal:        General: Normal range of motion.     Cervical back: Normal range of motion.  Skin:    Coloration: Skin is not jaundiced or pale.     Findings: No bruising, erythema, lesion or rash.  Neurological:     Mental Status: She is alert and oriented to person, place, and time. Mental status is at baseline.  Psychiatric:        Mood and Affect: Mood normal.        Behavior: Behavior normal.        Thought Content: Thought content normal.        Judgment: Judgment normal.     Results for orders placed or performed in visit on 01/12/20  Urine Culture   Specimen: Urine   UR  Result Value Ref Range   Urine Culture, Routine Final report    Organism ID, Bacteria Comment   Microscopic Examination   URINE  Result Value Ref Range   WBC, UA >30 (A) 0 - 5 /hpf   RBC 3-10 (A) 0 - 2 /hpf   Epithelial Cells (non renal) 0-10 0 - 10 /hpf   Mucus, UA Present Not Estab.   Bacteria, UA Moderate (A) None seen/Few  CBC with Differential/Platelet  Result Value Ref Range   WBC 6.1 3.4 - 10.8 x10E3/uL   RBC 4.96 3.77 - 5.28 x10E6/uL   Hemoglobin 15.5 11.1 - 15.9 g/dL   Hematocrit 74.1 (H) 28.7 - 46.6 %   MCV 95 79 - 97 fL   MCH 31.3 26.6 - 33.0 pg   MCHC 32.8 31 - 35 g/dL   RDW 86.7 67.2 - 09.4 %   Platelets 232 150 - 450 x10E3/uL   Neutrophils 50 Not Estab. %   Lymphs 38 Not Estab. %   Monocytes 9 Not Estab. %   Eos 2 Not Estab. %   Basos 1 Not Estab. %   Neutrophils Absolute 3.0 1 - 7 x10E3/uL   Lymphocytes Absolute 2.3 0 - 3 x10E3/uL   Monocytes Absolute 0.5 0 - 0 x10E3/uL   EOS (ABSOLUTE) 0.1 0.0 - 0.4 x10E3/uL   Basophils Absolute 0.1 0 - 0 x10E3/uL   Immature Granulocytes 0 Not Estab. %   Immature Grans (Abs) 0.0 0.0 - 0.1 x10E3/uL  Comprehensive metabolic panel  Result Value Ref Range   Glucose  87 65 - 99 mg/dL   BUN 13 8 - 27 mg/dL   Creatinine, Ser 7.09 (H) 0.57 - 1.00 mg/dL   GFR calc non Af Amer 46 (L) >59 mL/min/1.73   GFR calc Af Amer 53 (L) >59 mL/min/1.73   BUN/Creatinine Ratio 11 (L) 12 - 28   Sodium 139 134 - 144 mmol/L   Potassium 4.7 3.5 - 5.2 mmol/L   Chloride 102 96 - 106 mmol/L   CO2 25 20 -  29 mmol/L   Calcium 9.8 8.7 - 10.3 mg/dL   Total Protein 6.7 6.0 - 8.5 g/dL   Albumin 4.2 3.7 - 4.7 g/dL   Globulin, Total 2.5 1.5 - 4.5 g/dL   Albumin/Globulin Ratio 1.7 1.2 - 2.2   Bilirubin Total 0.5 0.0 - 1.2 mg/dL   Alkaline Phosphatase 82 39 - 117 IU/L   AST 24 0 - 40 IU/L   ALT 16 0 - 32 IU/L  Lipid Panel w/o Chol/HDL Ratio  Result Value Ref Range   Cholesterol, Total 219 (H) 100 - 199 mg/dL   Triglycerides 825 (H) 0 - 149 mg/dL   HDL 43 >00 mg/dL   VLDL Cholesterol Cal 29 5 - 40 mg/dL   LDL Chol Calc (NIH) 370 (H) 0 - 99 mg/dL  Microalbumin, Urine Waived  Result Value Ref Range   Microalb, Ur Waived 30 (H) 0 - 19 mg/L   Creatinine, Urine Waived 50 10 - 300 mg/dL   Microalb/Creat Ratio 30-300 (H) <30 mg/g  TSH  Result Value Ref Range   TSH 2.850 0.450 - 4.500 uIU/mL  Urinalysis, Routine w reflex microscopic  Result Value Ref Range   Specific Gravity, UA 1.015 1.005 - 1.030   pH, UA 6.5 5.0 - 7.5   Color, UA Yellow Yellow   Appearance Ur Hazy (A) Clear   Leukocytes,UA 2+ (A) Negative   Protein,UA Negative Negative/Trace   Glucose, UA Negative Negative   Ketones, UA Negative Negative   RBC, UA 2+ (A) Negative   Bilirubin, UA Negative Negative   Urobilinogen, Ur 0.2 0.2 - 1.0 mg/dL   Nitrite, UA Negative Negative   Microscopic Examination See below:       Assessment & Plan:   Problem List Items Addressed This Visit      Genitourinary   Benign hypertensive renal disease - Primary    No way to check her BP today. Encouraged her to get a BP cuff and to monitor it. Call with any concerns.         Other   Anxiety    Still not doing great.  Discussed seeing psychiatry again. She agrees. Referral placed today.      Relevant Orders   Ambulatory referral to Psychiatry   Depression, recurrent (HCC)    Still not doing great. Discussed seeing psychiatry again. She agrees. Referral placed today.      Relevant Orders   Ambulatory referral to Psychiatry   Mental status, decreased    Discussed seeing psychiatry again. She agrees. Referral placed today.      Relevant Orders   Ambulatory referral to Psychiatry   Hyperlipidemia    Just started on a statin by her new cardiologist. Continue to monitor. Call with any concerns. Due for recheck on labs in 1-3 months.           Follow up plan: Return in about 3 months (around 07/16/2020).   . This visit was completed via MyChart due to the restrictions of the COVID-19 pandemic. All issues as above were discussed and addressed. Physical exam was done as above through visual confirmation on MyChart. If it was felt that the patient should be evaluated in the office, they were directed there. The patient verbally consented to this visit. . Location of the patient: home . Location of the provider: work . Those involved with this call:  . Provider: Olevia Perches, DO . CMA: Wilhemena Durie, CMA . Front Desk/Registration: Adela Ports  . Time spent on call: 25  minutes with patient face to face via video conference. More than 50% of this time was spent in counseling and coordination of care. 40 minutes total spent in review of patient's record and preparation of their chart.

## 2020-04-16 ENCOUNTER — Telehealth: Payer: Self-pay

## 2020-04-16 ENCOUNTER — Ambulatory Visit: Payer: Self-pay | Admitting: General Practice

## 2020-04-16 DIAGNOSIS — I10 Essential (primary) hypertension: Secondary | ICD-10-CM

## 2020-04-16 DIAGNOSIS — N183 Chronic kidney disease, stage 3 unspecified: Secondary | ICD-10-CM

## 2020-04-16 DIAGNOSIS — F339 Major depressive disorder, recurrent, unspecified: Secondary | ICD-10-CM

## 2020-04-16 DIAGNOSIS — F419 Anxiety disorder, unspecified: Secondary | ICD-10-CM

## 2020-04-16 DIAGNOSIS — E782 Mixed hyperlipidemia: Secondary | ICD-10-CM

## 2020-04-16 NOTE — Patient Instructions (Signed)
Visit Information  Goals Addressed            This Visit's Progress   . RNCM: Chronic Disease Managment and suppor       Current Barriers:  . Chronic Disease Management support, education, and care coordination needs related to HTN, HLD, CKD Stage 3, and Depression  Clinical Goal(s) related to HTN, HLD, CKD Stage 3, and Depression:  Over the next 120 days, patient will:  . Work with the care management team to address educational, disease management, and care coordination needs  . Begin or continue self health monitoring activities as directed today Measure and record blood pressure 2 times per week . Call provider office for new or worsened signs and symptoms Blood pressure findings outside established parameters, New or worsened symptom related to HTN, HLD, Chronic Kidney Disease 3, and depression . Call care management team with questions or concerns . Verbalize basic understanding of patient centered plan of care established today  Interventions related to HTN, HLD, CKD Stage 3, and Depression:  . Evaluation of current treatment plans and patient's adherence to plan as established by provider.  04-13-2020: The granddaughter feels the patient is not taking her medications as directed. The granddaughter further states that the patient is "worked up" right now and there is a lot of family drama going on with the patients grandson and the patient having to get paperwork filled out for the [facility] so she can drive. The granddaughter does not feel like the patient needs to be driving and is very concerned. The patient has been avoiding talking to the granddaughter and depending on her to help in her care.   . Assessed patient understanding of disease states.  The granddaughter states she understands her chronic conditions but wants to talk to pcp about the patients mood changes and not acting age appropriate. The granddaughter is open to ideas and recommendations from the care team. 04/13/2020:  The  granddaughter continue to ask for recommendations on how to best help the patient. She is concerned about the patient driving. She feels if she is taking her medications as directed that it is not an issue; however if she goes out driving in a rage she is concerned that she will hurt herself or someone else.   . Assessed patient's education and care coordination needs. 04-13-2020: Will touch base with the LCSW to follow up with the patient granddaughter for recommendations and help with anxiety and depression. 04-16-2020: The patients granddaughter called the [facility] asking for recommendations. She discovered today that her grandmother had been scammed and has paid out >1000.00 to the scammers as they have contacted the patient by text messaging. The patient is getting ready to have her lights cut off because she has not paid her light bill. The granddaughter was seeking help with how to address the granddaughter taking over the patients finances. Education on talking to the patient and allowing her granddaughter to control her finances. Before the patients daughter did this by taking her monthly check, paying all the bills and giving the rest to the patient.  Advised the granddaughter to block the numbers off of the patients phone, tell the patient to discuss any text like this in the future before she reacts to them and discuss the best way for the patients finances to be handled. Will do a care guide referral for financial resources and have discussed the case with the LCSW. The LCSW will reach out to the patients granddaughter next week to assist  with questions or concerns. The granddaughter is going to pay the patient's light bill and will discuss with the patient the other ideas to come up with a solution to protect her in the future from being scammed.  . Evaluation of HTN/HLD/CKD/Depression/Anxiety: The patients granddaughter does not have any vital sign readings for the [facility]. Attempts to reach the patient were  unsuccessful. Empathetic listening and support given. The granddaughter feels the patient is  not safe but does wish she would consider talking to a psychiatrist.  The patient has refused this in the past.  . Provided disease specific education to patient. Education on help and resources to help with her depression and anxiety. The patient seems to be doing better since getting a smaller dog. The patient seems to be taking her medication at this time.  [name] with appropriate clinical care team members regarding patient needs.  Pharmacy and LCSW ongoing support and help with managing health and well being of the patient.   Patient Self Care Activities related to HTN, HLD, CKD Stage 3, and Depression:  . Patient is unable to independently self-manage chronic health conditions  Please see past updates related to this goal by clicking on the "Past Updates" button in the selected goal         Patient verbalizes understanding of instructions provided today.   The care management team will reach out to the patient again over the next 30 to 45 days.   Alto Denver RN, MSN, CCM Community Care Coordinator [place]  [facility] Mobile: [phone]

## 2020-04-16 NOTE — Telephone Encounter (Signed)
Thank you so much. I appreciate you helping the patient.

## 2020-04-16 NOTE — Telephone Encounter (Signed)
04/15/20 Spoke with patient's gran-daughter Frances Furbish regarding assistance with utility bill.  Emailed information to apply for [employer] and information I will need to submit an application for assistance through [employer]. Will follow-up next week. Olean Ree [phone]

## 2020-04-16 NOTE — Chronic Care Management (AMB) (Signed)
Chronic Care Management   Follow Up Note   04/16/2020 Name: Renee Bridges MRN: [id number] DOB: [birth year]  Referred by: [name], DO Reason for referral : Chronic Care Management (Incoming call from the granddaughter seeking help)   Renee Bridges is a 74 y.o. year old female who is a primary care patient of [name], DO. The CCM team was consulted for assistance with chronic disease management and care coordination needs.    Review of patient status, including review of consultants reports, relevant laboratory and other test results, and collaboration with appropriate care team members and the patient's provider was performed as part of comprehensive patient evaluation and provision of chronic care management services.    SDOH (Social Determinants of Health) assessments performed: Yes See Care Plan activities for detailed interventions related to [facility])     Outpatient Encounter Medications as of 04/16/2020  Medication Sig  . atorvastatin (LIPITOR) 20 MG tablet Take 1 tablet (20 mg total) by mouth daily.  . QUEtiapine (SEROQUEL XR) 50 MG TB24 24 hr tablet Take 1 tablet (50 mg total) by mouth at bedtime.   No facility-administered encounter medications on file as of 04/16/2020.     Objective:   Goals Addressed            This Visit's Progress   . RNCM: Chronic Disease Managment and suppor       Current Barriers:  . Chronic Disease Management support, education, and care coordination needs related to HTN, HLD, CKD Stage 3, and Depression  Clinical Goal(s) related to HTN, HLD, CKD Stage 3, and Depression:  Over the next 120 days, patient will:  . Work with the care management team to address educational, disease management, and care coordination needs  . Begin or continue self health monitoring activities as directed today Measure and record blood pressure 2 times per week . Call provider office for new or worsened signs and symptoms Blood pressure findings  outside established parameters, New or worsened symptom related to HTN, HLD, Chronic Kidney Disease 3, and depression . Call care management team with questions or concerns . Verbalize basic understanding of patient centered plan of care established today  Interventions related to HTN, HLD, CKD Stage 3, and Depression:  . Evaluation of current treatment plans and patient's adherence to plan as established by provider.  04-13-2020: The granddaughter feels the patient is not taking her medications as directed. The granddaughter further states that the patient is "worked up" right now and there is a lot of family drama going on with the patients grandson and the patient having to get paperwork filled out for the [facility] so she can drive. The granddaughter does not feel like the patient needs to be driving and is very concerned. The patient has been avoiding talking to the granddaughter and depending on her to help in her care.   . Assessed patient understanding of disease states.  The granddaughter states she understands her chronic conditions but wants to talk to pcp about the patients mood changes and not acting age appropriate. The granddaughter is open to ideas and recommendations from the care team. 04/13/2020:  The granddaughter continue to ask for recommendations on how to best help the patient. She is concerned about the patient driving. She feels if she is taking her medications as directed that it is not an issue; however if she goes out driving in a rage she is concerned that she will hurt herself or someone else.   Marland Kitchen  Assessed patient's education and care coordination needs. 04-13-2020: Will touch base with the LCSW to follow up with the patient granddaughter for recommendations and help with anxiety and depression. 04-16-2020: The patients granddaughter called the [facility] asking for recommendations. She discovered today that her grandmother had been scammed and has paid out >1000.00 to the scammers as they  have contacted the patient by text messaging. The patient is getting ready to have her lights cut off because she has not paid her light bill. The granddaughter was seeking help with how to address the granddaughter taking over the patients finances. Education on talking to the patient and allowing her granddaughter to control her finances. Before the patients daughter did this by taking her monthly check, paying all the bills and giving the rest to the patient.  Advised the granddaughter to block the numbers off of the patients phone, tell the patient to discuss any text like this in the future before she reacts to them and discuss the best way for the patients finances to be handled. Will do a care guide referral for financial resources and have discussed the case with the LCSW. The LCSW will reach out to the patients granddaughter next week to assist with questions or concerns. The granddaughter is going to pay the patient's light bill and will discuss with the patient the other ideas to come up with a solution to protect her in the future from being scammed.  . Evaluation of HTN/HLD/CKD/Depression/Anxiety: The patients granddaughter does not have any vital sign readings for the [facility] Attempts to reach the patient were unsuccessful. Empathetic listening and support given. The granddaughter feels the patient is  not safe but does wish she would consider talking to a psychiatrist.  The patient has refused this in the past.  . Provided disease specific education to patient. Education on help and resources to help with her depression and anxiety. The patient seems to be doing better since getting a smaller dog. The patient seems to be taking her medication at this time.  [name] with appropriate clinical care team members regarding patient needs.  Pharmacy and LCSW ongoing support and help with managing health and well being of the patient.   Patient Self Care Activities related to HTN, HLD, CKD Stage 3,  and Depression:  . Patient is unable to independently self-manage chronic health conditions  Please see past updates related to this goal by clicking on the "Past Updates" button in the selected goal          Plan:   The care management team will reach out to the patient again over the next 30 to 45 days.    Alto Denver RN, MSN, CCM Community Care Coordinator [place]  [facility] Mobile: [phone]

## 2020-04-18 NOTE — Assessment & Plan Note (Signed)
Still not doing great. Discussed seeing psychiatry again. She agrees. Referral placed today. °

## 2020-04-18 NOTE — Assessment & Plan Note (Signed)
Discussed seeing psychiatry again. She agrees. Referral placed today.

## 2020-04-18 NOTE — Assessment & Plan Note (Signed)
Still not doing great. Discussed seeing psychiatry again. She agrees. Referral placed today.

## 2020-04-18 NOTE — Assessment & Plan Note (Signed)
No way to check her BP today. Encouraged her to get a BP cuff and to monitor it. Call with any concerns.

## 2020-04-18 NOTE — Assessment & Plan Note (Signed)
Just started on a statin by her new cardiologist. Continue to monitor. Call with any concerns. Due for recheck on labs in 1-3 months.

## 2020-04-19 ENCOUNTER — Ambulatory Visit: Payer: Self-pay | Admitting: *Deleted

## 2020-04-19 ENCOUNTER — Telehealth: Payer: Self-pay | Admitting: Family Medicine

## 2020-04-19 NOTE — Telephone Encounter (Signed)
   Copied from CRM [id number]. Topic: General - Other >> Apr 19, 2020  1:17 PM Gwenlyn Fudge wrote: Reason for CRM: Pt called stating that Brayton Caves is bringing by some paperwork for her this afternoon. She is requesting to have a call back to let her know when the paperwork has been dropped off. Pt also states that she has started to vomit. Attempted to speak with pt about setting up an appt and she could not hear me. She states that Brayton Caves can set up the appt for her when she comes into the office. Please advise.

## 2020-04-19 NOTE — Telephone Encounter (Signed)
Patient got sick last night and reached out-  Patient thinks it was something she ate- she got sick - she feels this way every time she eats.  Patient needs to get paperwork for her driving license. [name] needs to bring them. Patient is all upset-that she has turned over her bank account to [place]."She has done me wrong- I am about ready to commit suicide- but I am not going to."  Patient states she only drives to grocery and to get money order for rent- she is very upset- patient does not want her concerns discussed. Patient wants confidentiality.  Call to office- they recommend sending 911 out to check on patient- 911 called and office notified.   Reason for Disposition . Patient is threatening suicide now  Answer Assessment - Initial Assessment Questions 1. CONCERN: "What happened that made you call today?"     Upset over losing driving privilege  2. ANXIETY SYMPTOM SCREENING: "Can you describe how you have been feeling?"  (e.g., tense, restless, panicky, anxious, keyed up, trouble sleeping, trouble concentrating)     Anxious- patient wants to speak to PCP 3. ONSET: "How long have you been feeling this way?"     Patient is very upset over losing driving privilrge  4. RECURRENT: "Have you felt this way before?"  If Yes, ask: "What happened that time?" "What helped these feelings go away in the past?"      Patient feels alone and isolated 5. RISK OF HARM - SUICIDAL IDEATION:  "Do you ever have thoughts of hurting or killing yourself?"  (e.g., yes, no, no but preoccupation with thoughts about death)   - INTENT:  "Do you have thoughts of hurting or killing yourself right NOW?" (e.g., yes, no, N/A)   - PLAN: "Do you have a specific plan for how you would do this?" (e.g., gun, knife, overdose, no plan, N/A)     Patient is very alone- she feels isolated, patient is stating she is ok- she is going 6. RISK OF HARM - HOMICIDAL IDEATION:  "Do you ever have thoughts of hurting or killing someone  else?"  (e.g., yes, no, no but preoccupation with thoughts about death)   - INTENT:  "Do you have thoughts of hurting or killing someone right NOW?" (e.g., yes, no, N/A)   - PLAN: "Do you have a specific plan for how you would do this?" (e.g., gun, knife, no plan, N/A)      no 7. FUNCTIONAL IMPAIRMENT: "How have things been going for you overall? Have you had more difficulty than usual doing your normal daily activities?"  (e.g., better, same, worse; self-care, school, work, interactions)     Patient states she is lonely 8. SUPPORT: "Who is with you now?" "Who do you live with?" "Do you have family or friends who you can talk to?"      Patient has a dog and she will not hurt herself. 9. THERAPIST: "Do you have a counselor or therapist? Name?"     no 10. STRESSORS: "Has there been any new stress or recent changes in your life?"       Recent changes- have been hard- patient is adimant she is not going to hurt herself 11. CAFFEINE USE: "Do you drink caffeinated beverages, and how much each day?" (e.g., coffee, tea, colas)       no 12. ALCOHOL USE OR SUBSTANCE USE (DRUG USE): "Do you drink alcohol or use any illegal drugs?"       no 13. OTHER  SYMPTOMS: "Do you have any other physical symptoms right now?" (e.g., chest pain, palpitations, difficulty breathing, fever)       None- she is calm now 14. PREGNANCY: "Is there any chance you are pregnant?" "When was your last menstrual period?"       n/a  Protocols used: SUICIDE CONCERNS-A-AH, ANXIETY AND PANIC ATTACK-A-AH

## 2020-04-20 ENCOUNTER — Telehealth: Payer: Self-pay

## 2020-04-20 ENCOUNTER — Telehealth: Payer: Self-pay | Admitting: Family Medicine

## 2020-04-20 NOTE — Telephone Encounter (Signed)
Due to welfare check after expressing suicidality. Noted.

## 2020-04-20 NOTE — Telephone Encounter (Signed)
04/20/20 Unable to leave message for patient's grand-daughter Dorcas Mcmurray, voicemail full.  Received financial information via email from patient's grand-daughter Dorcas Mcmurray. Submitted application to [place] at [employer].  Will keep Brayton Caves updated on status of application.  No other resources needed at this time. Closing referral. Olean Ree [phone]

## 2020-04-20 NOTE — Telephone Encounter (Signed)
Pt called in and stated that she will not be coming back to the office , she stated Dr Laural Benes will know why.  She stated "they called the police on me" so I will not be back.  She stated she was changing her phone number .

## 2020-04-21 ENCOUNTER — Ambulatory Visit: Payer: Medicaid Other | Admitting: Licensed Clinical Social Worker

## 2020-04-21 ENCOUNTER — Ambulatory Visit: Payer: Medicare Other | Admitting: Family Medicine

## 2020-04-21 NOTE — Chronic Care Management (AMB) (Addendum)
Care Management   Follow Up Note   04/21/2020 Name: Renee Bridges MRN: [id number] DOB: 1945/11/03  Referred by: Renee Carrow, DO Reason for referral : Care Coordination   Renee Bridges is a 74 y.o. year old female who is a primary care patient of Renee Carrow, DO. The care management team was consulted for assistance with care management and care coordination needs.    Review of patient status, including review of consultants reports, relevant laboratory and other test results, and collaboration with appropriate care team members and the patient's provider was performed as part of comprehensive patient evaluation and provision of chronic care management services.    LCSW completed CCM outreach attempt today to patient's granddaughter but was unable to reach her successfully or leave a voice message. LCSW sent a text message to patient's granddaughter Renee Bridges requesting a return call regarding APS report that was placed today on 04/21/20. LCSW received return call back from Renee Bridges.   Advanced Directives: See Care Plan and Vynca application for related entries.   Goals Addressed      SW: We need more help and support with Renee Bridges (pt-stated)        Current Barriers:   Chronic Mental Health needs related to Depression and Grief  Financial constraints related to managing health care expenses  Limited social support  ADL IADL limitations  Mental Health Concerns   Limited access to caregiver  Cognitive Deficits  Memory Deficits  Inability to perform ADL's independently  Inability to perform IADL's independently  Suicidal Ideation/Homicidal Ideation: No  Clinical Social Work Goal(s):   Over the next 120 days, patient will work with SW  bi-monthly  by telephone or in person to reduce or manage symptoms related to depression, grief, anxiety and ongoing stressors  Over the next 120 days, patient will demonstrate improved health management independence as evidenced by  implementing positive self-care into her daily routines  Interventions:  Patient interviewed and appropriate assessments performed: brief mental health assessment  Provided family with personal care service resource education  Granddaughter Renee Bridges reports that patient often isolates where she resides now and she feels that patient would thrive more if she were to relocate near her and her family. Renee Bridges reports living in [place] area. Family is wanting C3 referral for low income housing resources for patient.   Granddaughter reports that patient was provided a medium sized dog after she lost her own but this dog was too big and could have knocked her over so they went back and got her a smaller dog which is more suitable to patient's needs.   Patient interviewed and appropriate assessments performed  Provided mental health counseling with regard to grief   Provided patient with information about available mental health resources within the nearby area  Discussed plans with patient for ongoing care management follow up and provided patient with direct contact information for care management team  Advised patient to contact CCM LCSW directly if case management needs arise  Assisted patient/caregiver with obtaining information about health plan benefits  Provided education and assistance to client regarding Advanced Directives.  A voluntary and extensive discussion about advanced care planning including explanation and discussion of advanced was undertaken with the patient and Renee Bridges (caretaker). Explanation regarding healthcare proxy and living will was reviewed and packet with forms with explanation of how to fill them out was given.    The granddaughter feels the patient is not taking her medications as directed. The granddaughter further  states that the patient is "worked up" right now and there is a lot of family drama going on with the patients grandson and  the patient having to get paperwork filled out for the [facility] so she can drive. The granddaughter does not feel like the patient needs to be driving and is very concerned. The patient has been avoiding talking to the granddaughter and depending on her to help in her care.    The granddaughter is concerned about the patient driving. She feels if she is taking her medications as directed that it is not an issue; however if she goes out driving in a rage she is concerned that she will hurt herself or someone else.    The patients granddaughter spoke with CCM LCSW for recommendations. She discovered that her grandmother had been scammed and has paid out >1000.00 to the scammers as they have contacted the patient by text messaging. The patient is getting ready to have her lights cut off because she has not paid her light bill. The granddaughter was seeking help with how to address the granddaughter taking over the patients finances. Education on talking to the patient and allowing her granddaughter to control her finances. Before the patients daughter did this by taking her monthly check, paying all the bills and giving the rest to the patient.  Advised the granddaughter to block the numbers off of the patients phone, tell the patient to discuss any text like this in the future before she reacts to them and discuss the best way for the patients finances to be handled. Care guide referral was completed by [facility] for financial resources. The granddaughter is going to pay the patient's light bill and will discuss with the patient the other ideas to come up with a solution to protect her in the future from being scammed.  Provided education to patient/caregiver regarding level of care options.  Provided education to patient/caregiver about Hospice and/or Palliative Care services  Encouraged family to consider [facility] for grief follow up and therapy/counseling  Solution-Focused Strategies education provided to  family  LCSW filed APS report on patient with [facility] on 04/21/20 and included PCP, sister, granddaughter and CCM Nurse on report.    Patient Self Care Activities:   Attends all scheduled provider appointments  Strong family or social support  Patient Coping Strengths:   Family  Hopefulness  Patient Self Care Deficits:   Lacks social connections  Unable to perform ADLs independently  Unable to perform IADLs independently  Please see past updates related to this goal by clicking on the "Past Updates" button in the selected goal      The care management team will reach out to the patient again over the next 30 days.   Dickie La, BSW, MSW, LCSW [facility] Care Management [place]   [facility] [email or web address] Phone: [phone]

## 2020-04-22 ENCOUNTER — Telehealth: Payer: Self-pay | Admitting: Licensed Clinical Social Worker

## 2020-04-22 NOTE — Telephone Encounter (Signed)
  Care Management   Follow Up Note  04/22/2020 Name: Renee Bridges MRN: [id number] DOB: 18-Feb-1946  Renee Bridges is a 74 y.o. year old female who is a primary care patient of [name], DO. The CCM team was consulted for assistance with Level of Care Concerns.   Review of patient status, including review of consultants reports, relevant laboratory and other test results, and collaboration with appropriate care team members and the patient's provider was performed as part of comprehensive patient evaluation and provision of chronic care management services.    LCSW received incoming text message from granddaughter confirming that APS completed their initial visit yesterday after LCSW filed report. Family reports that patient is very upset over this and has called granddaughter 4 times and sent 7 text messages in a matter of hours regarding this matter. LCSW updated CCM team.   Outpatient Encounter Medications as of 04/22/2020  Medication Sig  . atorvastatin (LIPITOR) 20 MG tablet Take 1 tablet (20 mg total) by mouth daily.  . QUEtiapine (SEROQUEL XR) 50 MG TB24 24 hr tablet Take 1 tablet (50 mg total) by mouth at bedtime.   No facility-administered encounter medications on file as of 04/22/2020.    Follow Up Plan: SW will follow up with patient by phone over the next 14 days with APS.   Dickie La, BSW, MSW, LCSW [facility] Care [facility] [place]  [facility] [email or web address] Phone: [phone]

## 2020-04-28 ENCOUNTER — Telehealth: Payer: Medicaid Other

## 2020-05-03 ENCOUNTER — Ambulatory Visit: Payer: Medicaid Other | Admitting: Licensed Clinical Social Worker

## 2020-05-03 ENCOUNTER — Telehealth: Payer: Self-pay | Admitting: Licensed Clinical Social Worker

## 2020-05-03 DIAGNOSIS — N183 Chronic kidney disease, stage 3 unspecified: Secondary | ICD-10-CM | POA: Diagnosis not present

## 2020-05-03 DIAGNOSIS — F419 Anxiety disorder, unspecified: Secondary | ICD-10-CM

## 2020-05-03 DIAGNOSIS — I10 Essential (primary) hypertension: Secondary | ICD-10-CM

## 2020-05-03 DIAGNOSIS — F339 Major depressive disorder, recurrent, unspecified: Secondary | ICD-10-CM | POA: Diagnosis not present

## 2020-05-03 DIAGNOSIS — E782 Mixed hyperlipidemia: Secondary | ICD-10-CM

## 2020-05-03 NOTE — Chronic Care Management (AMB) (Signed)
Chronic Care Management    Clinical Social Work Follow Up Note  05/03/2020 Name: Renee Bridges MRN: [id number] DOB: 04/02/1946  Renee Bridges is a 74 y.o. year old female who is a primary care patient of [name], DO. The CCM team was consulted for assistance with Level of Care Concerns.   Review of patient status, including review of consultants reports, other relevant assessments, and collaboration with appropriate care team members and the patient's provider was performed as part of comprehensive patient evaluation and provision of chronic care management services.    SDOH (Social Determinants of Health) assessments performed: Yes    Outpatient Encounter Medications as of 05/03/2020  Medication Sig   atorvastatin (LIPITOR) 20 MG tablet Take 1 tablet (20 mg total) by mouth daily.   QUEtiapine (SEROQUEL XR) 50 MG TB24 24 hr tablet Take 1 tablet (50 mg total) by mouth at bedtime.   No facility-administered encounter medications on file as of 05/03/2020.     Goals Addressed      SW: We need more help and support with Renee Bridges (pt-stated)        Current Barriers:   Chronic Mental Health needs related to Depression and Grief  Financial constraints related to managing health care expenses  Limited social support  ADL IADL limitations  Mental Health Concerns   Limited access to caregiver  Cognitive Deficits  Memory Deficits  Inability to perform ADL's independently  Inability to perform IADL's independently  Suicidal Ideation/Homicidal Ideation: No  Clinical Social Work Goal(s):   Over the next 120 days, patient will work with SW  bi-monthly  by telephone or in person to reduce or manage symptoms related to depression, grief, anxiety and ongoing stressors  Over the next 120 days, patient will demonstrate improved health management independence as evidenced by implementing positive self-care into her daily routines  Interventions:  Patient interviewed and  appropriate assessments performed: brief mental health assessment  Provided family with personal care service resource education  Granddaughter [name] reports that patient often isolates where she resides now and she feels that patient would thrive more if she were to relocate near her and her family. [name] reports living in [place] area. Family is wanting C3 referral for low income housing resources for patient.   Granddaughter reports that patient was provided a medium sized dog after she lost her own but this dog was too big and could have knocked her over so they went back and got her a smaller dog which is more suitable to patient's needs.   Patient interviewed and appropriate assessments performed  Provided mental health counseling with regard to grief   Provided patient with information about available mental health resources within the nearby area  Discussed plans with patient for ongoing care management follow up and provided patient with direct contact information for care management team  Advised patient to contact CCM LCSW directly if case management needs arise  Collaborated with primary care provider re: Patient has new small dog that is helping her cope with the loss of her last one.   Assisted patient/caregiver with obtaining information about health plan benefits  Provided education and assistance to client regarding Advanced Directives.  A voluntary and extensive discussion about advanced care planning including explanation and discussion of advanced was undertaken with the patient and [name] (caretaker). Explanation regarding healthcare proxy and living will was reviewed and packet with forms with explanation of how to fill them out was given.  Provided education to patient/caregiver regarding level of care options.  Provided education to patient/caregiver about Hospice and/or Palliative Care services  The granddaughter feels the patient  is not taking her medications as directed. The granddaughter further states that the patient is "worked up" right now and there is a lot of family drama going on with the patients grandson and the patient having to get paperwork filled out for the [facility] so she can drive. The granddaughter does not feel like the patient needs to be driving and is very concerned. The patient has been avoiding talking to the granddaughter and depending on her to help in her care.    The granddaughter is concerned about the patient driving. She feels if she is taking her medications as directed that it is not an issue; however if she goes out driving in a rage she is concerned that she will hurt herself or someone else.    The patients granddaughter spoke with CCM LCSW for recommendations. She discovered that her grandmother had been scammed and has paid out >1000.00 to the scammers as they have contacted the patient by text messaging. The patient is getting ready to have her lights cut off because she has not paid her light bill. The granddaughter was seeking help with how to address the granddaughter taking over the patients finances. Education on talking to the patient and allowing her granddaughter to control her finances. Before the patients daughter did this by taking her monthly check, paying all the bills and giving the rest to the patient.  Advised the granddaughter to block the numbers off of the patients phone, tell the patient to discuss any text like this in the future before she reacts to them and discuss the best way for the patients finances to be handled. Care guide referral was completed by [facility] for financial resources. The granddaughter is going to pay the patient's light bill and will discuss with the patient the other ideas to come up with a solution to protect her in the future from being scammed.  Encouraged family to consider [facility] for grief follow up and therapy/counseling  Solution-Focused  Strategies education provided to family  LCSW filed APS report on patient with [facility] on 04/21/20 and included PCP, sister, granddaughter and CCM Nurse on report. LCSW spoke with granddaughter on 05/03/20 and was informed that she had been unable to receive a phone call back from patient's current APS caseworker Mauricio Po. LCSW completed call to APS caseworker Koleen Nimrod at [phone] and left a detailed voice message encouraging a return cal to CFP and family in order to provide care coordination and receive update on current investigation.    Patient Self Care Activities:   Attends all scheduled provider appointments  Strong family or social support  Patient Coping Strengths:   Family  Hopefulness  Patient Self Care Deficits:   Lacks social connections  Unable to perform ADLs independently  Unable to perform IADLs independently  Please see past updates related to this goal by clicking on the "Past Updates" button in the selected goal       Follow Up Plan: SW will follow up with patient by phone over the next 30 days  Dickie La, BSW, MSW, LCSW [facility] Care [facility] [place]   [facility] [email or web address] Phone: [phone]

## 2020-05-03 NOTE — Telephone Encounter (Signed)
  Chronic Care Management    Clinical Social Work General Follow Up Note  05/03/2020 Name: ZUHA DEJONGE MRN: [id number] DOB: 1945/09/27  ROSELINA BURGUENO is a 74 y.o. year old female who is a primary care patient of [name], DO. The CCM team was consulted for assistance with Level of Care Concerns.   LCSW received incoming call from patient's APS caseworker. She informed LCSW that she is only able to inform me if the case is currently active or not and cannot give any further details. She encouraged CCM team, family and PCP to contact her if any new safety concerns arise. She reports that this case is fairly new to her and she has completed one home visit with patient already but is unable to provide any details regarding how this went. APS caseworker will only update LCSW once her investigation is completed. LCSW updated patient's granddaughter. APS caseworker is agreeable to contact granddaughter today as well to provide this very update. [name] reports that patient's license was revoked from the [facility] today.    Review of patient status, including review of consultants reports, relevant laboratory and other test results, and collaboration with appropriate care team members and the patient's provider was performed as part of comprehensive patient evaluation and provision of chronic care management services.    Outpatient Encounter Medications as of 05/03/2020  Medication Sig  . atorvastatin (LIPITOR) 20 MG tablet Take 1 tablet (20 mg total) by mouth daily.  . QUEtiapine (SEROQUEL XR) 50 MG TB24 24 hr tablet Take 1 tablet (50 mg total) by mouth at bedtime.   No facility-administered encounter medications on file as of 05/03/2020.    Follow Up Plan: SW will follow up with patient by phone over the next 30 days   Dickie La, BSW, MSW, LCSW [facility] Care [facility] [place]  [facility] [email or web address] Phone: [phone]

## 2020-05-12 ENCOUNTER — Ambulatory Visit (INDEPENDENT_AMBULATORY_CARE_PROVIDER_SITE_OTHER): Payer: Medicare Other | Admitting: General Practice

## 2020-05-12 DIAGNOSIS — Z9189 Other specified personal risk factors, not elsewhere classified: Secondary | ICD-10-CM

## 2020-05-12 DIAGNOSIS — I10 Essential (primary) hypertension: Secondary | ICD-10-CM

## 2020-05-12 DIAGNOSIS — F419 Anxiety disorder, unspecified: Secondary | ICD-10-CM

## 2020-05-12 DIAGNOSIS — E782 Mixed hyperlipidemia: Secondary | ICD-10-CM | POA: Diagnosis not present

## 2020-05-12 DIAGNOSIS — F339 Major depressive disorder, recurrent, unspecified: Secondary | ICD-10-CM

## 2020-05-12 DIAGNOSIS — I129 Hypertensive chronic kidney disease with stage 1 through stage 4 chronic kidney disease, or unspecified chronic kidney disease: Secondary | ICD-10-CM | POA: Diagnosis not present

## 2020-05-12 DIAGNOSIS — N183 Chronic kidney disease, stage 3 unspecified: Secondary | ICD-10-CM | POA: Diagnosis not present

## 2020-05-12 NOTE — Chronic Care Management (AMB) (Signed)
Chronic Care Management   Follow Up Note   05/12/2020 Name: VIVIENNE SANGIOVANNI MRN: [id number] DOB: [birth year]  Referred by: [name], DO Reason for referral : Chronic Care Management (RNCM follow up outreach for Chronic Disease Management and Care Coordination Needs)   PAELYN SMICK is a 74 y.o. year old female who is a primary care patient of [name], DO. The CCM team was consulted for assistance with chronic disease management and care coordination needs.    Review of patient status, including review of consultants reports, relevant laboratory and other test results, and collaboration with appropriate care team members and the patient's provider was performed as part of comprehensive patient evaluation and provision of chronic care management services.    SDOH (Social Determinants of Health) assessments performed: Yes See Care Plan activities for detailed interventions related to [facility])     Outpatient Encounter Medications as of 05/12/2020  Medication Sig  . atorvastatin (LIPITOR) 20 MG tablet Take 1 tablet (20 mg total) by mouth daily.  . QUEtiapine (SEROQUEL XR) 50 MG TB24 24 hr tablet Take 1 tablet (50 mg total) by mouth at bedtime.   No facility-administered encounter medications on file as of 05/12/2020.     Objective:  BP Readings from Last 3 Encounters:  04/05/20 (!) 112/86  02/27/20 125/86  01/12/20 (!) 144/82    Goals Addressed            This Visit's Progress   . RNCM: Chronic Disease Managment and suppor       Current Barriers:  . Chronic Disease Management support, education, and care coordination needs related to HTN, HLD, CKD Stage 3, and Depression  Clinical Goal(s) related to HTN, HLD, CKD Stage 3, and Depression:  Over the next 120 days, patient will:  . Work with the care management team to address educational, disease management, and care coordination needs  . Begin or continue self health monitoring activities as directed today Measure and  record blood pressure 2 times per week . Call provider office for new or worsened signs and symptoms Blood pressure findings outside established parameters, New or worsened symptom related to HTN, HLD, Chronic Kidney Disease 3, and depression . Call care management team with questions or concerns . Verbalize basic understanding of patient centered plan of care established today  Interventions related to HTN, HLD, CKD Stage 3, and Depression:  . Evaluation of current treatment plans and patient's adherence to plan as established by provider.  04-13-2020: The granddaughter feels the patient is not taking her medications as directed. The granddaughter further states that the patient is "worked up" right now and there is a lot of family drama going on with the patients grandson and the patient having to get paperwork filled out for the [facility] so she can drive. The granddaughter does not feel like the patient needs to be driving and is very concerned. The patient has been avoiding talking to the granddaughter and depending on her to help in her care.  05-12-2020: [name] states the patient is doing well at this time. She is a little upset because [name] can not come with her to her appointment tomorrow, but other than that she is doing well.  . Assessed patient understanding of disease states.  The granddaughter states she understands her chronic conditions but wants to talk to pcp about the patients mood changes and not acting age appropriate. The granddaughter is open to ideas and recommendations from the care team. 04/13/2020:  The  granddaughter continue to ask for recommendations on how to best help the patient. She is concerned about the patient driving. She feels if she is taking her medications as directed that it is not an issue; however if she goes out driving in a rage she is concerned that she will hurt herself or someone else.  05-12-2020: The patient sees Dr. Laural Benes tomorrow to get paperwork filled out for  the [facility].  The paperwork has to be turned into the [facility] and they will notify the patient of the decision if she is still able to safely drive.  . Assessed patient's education and care coordination needs. 04-13-2020: Will touch base with the LCSW to follow up with the patient granddaughter for recommendations and help with anxiety and depression. 05-12-2020: The patient is stable right now.  . Evaluation of HTN/HLD/CKD/Depression/Anxiety: The patients granddaughter does not have any vital sign readings for the [facility] Attempts to reach the patient were unsuccessful. Empathetic listening and support given. The granddaughter feels the patient is  not safe but does wish she would consider talking to a psychiatrist.  The patient has refused this in the past.  . Provided disease specific education to patient. Education on help and resources to help with her depression and anxiety. The patient seems to be doing better since getting a smaller dog. The patient seems to be taking her medication at this time.  [name] with appropriate clinical care team members regarding patient needs.  Pharmacy and LCSW ongoing support and help with managing health and well being of the patient.   Patient Self Care Activities related to HTN, HLD, CKD Stage 3, and Depression:  . Patient is unable to independently self-manage chronic health conditions  Please see past updates related to this goal by clicking on the "Past Updates" button in the selected goal      . RNCM: Tell Dr. Laural Benes I stopped taking that other medicine. It makes me jittery       Current Barriers:  Marland Kitchen Knowledge Deficits related to understanding the significance of taking medications as prescribed for chronic medical conditions because the patient "feels good" . Lacks caregiver support. Daughter [name] Deceased x 2 years, Daughter [name] not involved, has a granddaughter [name] that helps when she can . Literacy barriers . Transportation  barriers . Non-adherence to scheduled provider appointments . Non-adherence to prescribed medication regimen . Cognitive Deficits . Chronic Disease Management support and education needs related to HTN, HLD, Chronic Kidney Disease stage 3 and Depression  Nurse Case Manager Clinical Goal(s):  Marland Kitchen Over the next 120 days, patient will work with pcp and CCM team  to address needs related to management of chronic disease processes and taking prescribed medications as directed  . Over the next 120 days, patient will demonstrate a decrease in depression  exacerbations as evidenced by of the patient taking medications as directed . Over the next 120 days, patient will demonstrate improved health management independence as evidenced by following recommendations by the care team to help the patient with her health and well being . Over the next 90 days, patient will work with CM team pharmacist to manage medications and compliance concerns . Over the next 90 days, patient will work with CM clinical social worker to gain effective coping skills for her depression and the loss of her daughter 2 years ago . Over the next 90 days, patient will work with care guide team to assist with low income housing closer to her granddaughter in [place] .  Over the next 90 days, the patient will demonstrate ongoing self health care management ability as evidenced by following the recommendations of the pcp and CCM team  Interventions:  . Evaluation of current treatment plan related to HTN, HLD, Chronic Kidney disease, and depression and patient's adherence to plan as established by provider. . Advised patient's granddaughter of appointment scheduled on 04-15-2020.  The patients granddaughter is going to try to bring the patient to her appointment . Reviewed medications with patient and discussed noncompliance of medication regimen and the need to take medications as directed. 04-13-2020: The patients granddaughter states that  the patient tells her she is taking her medication as directed but she feels the patient is lying to her about it. RNCM attempted to call the patient to discuss her health and wellness but the patient did not answer. RNCM left a message. 9-8-202: [name] states that the patient is doing good at this time. She does have a follow up with the pcp tomorrow.  [name] can not come with the patient because she and her family have had COVID and the patient is upset about that but otherwise doing well at this time.  Marland Kitchen Collaborated with pcp and CCM team  regarding noncompliance of treatment regimen for chronic disease management.  04-13-2020: This is an ongoing issue with the patient per the patients granddaughter.  . Discussed plans with patient for ongoing care management follow up and provided patient with direct contact information for care management team . Care Guide referral for assistance in finding low income housing in [place] so the patient can be closer to her granddaughter.  02-24-2020: the granddaughter verbalized she has checked into some places closer to her but the price is out of range. She is not sure she would move or not. The patient purchased a Zenaida Niece and has not told the granddaughter. The granddaughter says she has had a Zenaida Niece about 3 months. [name]'s brother Clide Cliff finally told her that the patient was allowing him to use it at this time until he could get his own transportation. [name] states that the patient has not been talking to her much and has been avoiding her for about 3 weeks. 04-13-2020: The patients granddaughter states that she does not know how much longer she can help her grandmother in current state. She does not feel she is safe to drive and she is not taking her medications. She gets very easily upset. 05-12-2020: [name] states that the patient is actually doing well right now. She is not having any current acute exacerbations of anxiety and depression. The granddaughter expressed  gratitude for help with questions and concerns.  . Social Work referral for depression and multiple issues related to grief and loss . Pharmacy referral for noncompliance to prescribed medication regimen- the patient confirmed she is not taking any medications because she "feels good" and she stopped taking that "other medicine" because it made her feel "jittery" (medication fluoxetine for her depression)  Patient Self Care Activities:  . Patient verbalizes understanding of plan to see the pcp in the next 30 days to evaluate health and wellbeing . Self administers medications as prescribed . Attends all scheduled provider appointments . Calls provider office for new concerns or questions . Unable to independently manage chronic disease processes  . Unable to self administer medications as prescribed . Does not adhere to provider recommendations re: care plan and taking medications as prescribed  . Lacks social connections  Please see past updates related to this goal  by clicking on the "Past Updates" button in the selected goal          Plan:   Telephone follow up appointment with care management team member scheduled for: 06-29-2020 at 11:45 am   Alto DenverPam Tate RN, MSN, CCM Community Care Coordinator [place]  [facility] Mobile: [phone]

## 2020-05-12 NOTE — Patient Instructions (Signed)
Visit Information  Goals Addressed            This Visit's Progress   . RNCM: Chronic Disease Managment and suppor       Current Barriers:  . Chronic Disease Management support, education, and care coordination needs related to HTN, HLD, CKD Stage 3, and Depression  Clinical Goal(s) related to HTN, HLD, CKD Stage 3, and Depression:  Over the next 120 days, patient will:  . Work with the care management team to address educational, disease management, and care coordination needs  . Begin or continue self health monitoring activities as directed today Measure and record blood pressure 2 times per week . Call provider office for new or worsened signs and symptoms Blood pressure findings outside established parameters, New or worsened symptom related to HTN, HLD, Chronic Kidney Disease 3, and depression . Call care management team with questions or concerns . Verbalize basic understanding of patient centered plan of care established today  Interventions related to HTN, HLD, CKD Stage 3, and Depression:  . Evaluation of current treatment plans and patient's adherence to plan as established by provider.  04-13-2020: The granddaughter feels the patient is not taking her medications as directed. The granddaughter further states that the patient is "worked up" right now and there is a lot of family drama going on with the patients grandson and the patient having to get paperwork filled out for the [facility] so she can drive. The granddaughter does not feel like the patient needs to be driving and is very concerned. The patient has been avoiding talking to the granddaughter and depending on her to help in her care.  05-12-2020: Renee Bridges states the patient is doing well at this time. She is a little upset because Renee Bridges can not come with her to her appointment tomorrow, but other than that she is doing well.  . Assessed patient understanding of disease states.  The granddaughter states she understands her chronic  conditions but wants to talk to pcp about the patients mood changes and not acting age appropriate. The granddaughter is open to ideas and recommendations from the care team. 04/13/2020:  The granddaughter continue to ask for recommendations on how to best help the patient. She is concerned about the patient driving. She feels if she is taking her medications as directed that it is not an issue; however if she goes out driving in a rage she is concerned that she will hurt herself or someone else.  05-12-2020: The patient sees Renee Bridges tomorrow to get paperwork filled out for the [facility].  The paperwork has to be turned into the [facility] and they will notify the patient of the decision if she is still able to safely drive.  . Assessed patient's education and care coordination needs. 04-13-2020: Will touch base with the LCSW to follow up with the patient granddaughter for recommendations and help with anxiety and depression. 05-12-2020: The patient is stable right now.  . Evaluation of HTN/HLD/CKD/Depression/Anxiety: The patients granddaughter does not have any vital sign readings for the [facility] Attempts to reach the patient were unsuccessful. Empathetic listening and support given. The granddaughter feels the patient is  not safe but does wish she would consider talking to a psychiatrist.  The patient has refused this in the past.  . Provided disease specific education to patient. Education on help and resources to help with her depression and anxiety. The patient seems to be doing better since getting a smaller dog. The patient seems to be  taking her medication at this time.  Renee Bridges with appropriate clinical care team members regarding patient needs.  Pharmacy and LCSW ongoing support and help with managing health and well being of the patient.   Patient Self Care Activities related to HTN, HLD, CKD Stage 3, and Depression:  . Patient is unable to independently self-manage chronic health conditions  Please see  past updates related to this goal by clicking on the "Past Updates" button in the selected goal      . RNCM: Tell Renee Bridges I stopped taking that other medicine. It makes me jittery       Current Barriers:  Marland Kitchen Knowledge Deficits related to understanding the significance of taking medications as prescribed for chronic medical conditions because the patient "feels good" . Lacks caregiver support. Daughter Renee Bridges Deceased x 2 years, Daughter Renee Bridges not involved, has a granddaughter Renee Bridges that helps when she can . Literacy barriers . Transportation barriers . Non-adherence to scheduled provider appointments . Non-adherence to prescribed medication regimen . Cognitive Deficits . Chronic Disease Management support and education needs related to HTN, HLD, Chronic Kidney Disease stage 3 and Depression  Nurse Case Manager Clinical Goal(s):  Marland Kitchen Over the next 120 days, patient will work with pcp and CCM team  to address needs related to management of chronic disease processes and taking prescribed medications as directed  . Over the next 120 days, patient will demonstrate a decrease in depression  exacerbations as evidenced by of the patient taking medications as directed . Over the next 120 days, patient will demonstrate improved health management independence as evidenced by following recommendations by the care team to help the patient with her health and well being . Over the next 90 days, patient will work with CM team pharmacist to manage medications and compliance concerns . Over the next 90 days, patient will work with CM clinical social worker to gain effective coping skills for her depression and the loss of her daughter 2 years ago . Over the next 90 days, patient will work with care guide team to assist with low income housing closer to her granddaughter in [place] . Over the next 90 days, the patient will demonstrate ongoing self health care management ability as evidenced by following the  recommendations of the pcp and CCM team  Interventions:  . Evaluation of current treatment plan related to HTN, HLD, Chronic Kidney disease, and depression and patient's adherence to plan as established by provider. . Advised patient's granddaughter of appointment scheduled on 04-15-2020.  The patients granddaughter is going to try to bring the patient to her appointment . Reviewed medications with patient and discussed noncompliance of medication regimen and the need to take medications as directed. 04-13-2020: The patients granddaughter states that the patient tells her she is taking her medication as directed but she feels the patient is lying to her about it. RNCM attempted to call the patient to discuss her health and wellness but the patient did not answer. RNCM left a message. 9-8-202: Renee Bridges states that the patient is doing good at this time. She does have a follow up with the pcp tomorrow.  Renee Bridges can not come with the patient because she and her family have had COVID and the patient is upset about that but otherwise doing well at this time.  Marland Kitchen Collaborated with pcp and CCM team  regarding noncompliance of treatment regimen for chronic disease management.  04-13-2020: This is an ongoing issue with the patient per the patients granddaughter.  Marland Kitchen  Discussed plans with patient for ongoing care management follow up and provided patient with direct contact information for care management team . Care Guide referral for assistance in finding low income housing in [place] so the patient can be closer to her granddaughter.  02-24-2020: the granddaughter verbalized she has checked into some places closer to her but the price is out of range. She is not sure she would move or not. The patient purchased a Renee Bridges and has not told the granddaughter. The granddaughter says she has had a Renee Bridges about 3 months. Renee Bridges's brother Renee Bridges finally told her that the patient was allowing him to use it at this time until he could  get his own transportation. Renee Bridges states that the patient has not been talking to her much and has been avoiding her for about 3 weeks. 04-13-2020: The patients granddaughter states that she does not know how much longer she can help her grandmother in current state. She does not feel she is safe to drive and she is not taking her medications. She gets very easily upset. 05-12-2020: Renee Bridges states that the patient is actually doing well right now. She is not having any current acute exacerbations of anxiety and depression. The granddaughter expressed gratitude for help with questions and concerns.  . Social Work referral for depression and multiple issues related to grief and loss . Pharmacy referral for noncompliance to prescribed medication regimen- the patient confirmed she is not taking any medications because she "feels good" and she stopped taking that "other medicine" because it made her feel "jittery" (medication fluoxetine for her depression)  Patient Self Care Activities:  . Patient verbalizes understanding of plan to see the pcp in the next 30 days to evaluate health and wellbeing . Self administers medications as prescribed . Attends all scheduled provider appointments . Calls provider office for new concerns or questions . Unable to independently manage chronic disease processes  . Unable to self administer medications as prescribed . Does not adhere to provider recommendations re: care plan and taking medications as prescribed  . Lacks social connections  Please see past updates related to this goal by clicking on the "Past Updates" button in the selected goal         Patient verbalizes understanding of instructions provided today.   Telephone follow up appointment with care management team member scheduled for: 06-29-2020 at 11:45 am  Alto Denver RN, MSN, CCM Community Care Coordinator [place]  [facility] Mobile: [phone]

## 2020-05-13 ENCOUNTER — Ambulatory Visit: Payer: Medicare Other | Admitting: Family Medicine

## 2020-05-17 ENCOUNTER — Telehealth: Payer: Medicaid Other

## 2020-05-18 ENCOUNTER — Telehealth: Payer: Medicaid Other

## 2020-05-24 ENCOUNTER — Telehealth: Payer: Medicaid Other

## 2020-05-28 ENCOUNTER — Ambulatory Visit: Payer: Self-pay | Admitting: Licensed Clinical Social Worker

## 2020-05-28 ENCOUNTER — Telehealth: Payer: Medicaid Other

## 2020-05-28 ENCOUNTER — Telehealth: Payer: Self-pay | Admitting: Licensed Clinical Social Worker

## 2020-05-28 DIAGNOSIS — E782 Mixed hyperlipidemia: Secondary | ICD-10-CM | POA: Diagnosis not present

## 2020-05-28 DIAGNOSIS — N183 Chronic kidney disease, stage 3 unspecified: Secondary | ICD-10-CM

## 2020-05-28 DIAGNOSIS — F339 Major depressive disorder, recurrent, unspecified: Secondary | ICD-10-CM

## 2020-05-28 DIAGNOSIS — I10 Essential (primary) hypertension: Secondary | ICD-10-CM

## 2020-05-28 DIAGNOSIS — I129 Hypertensive chronic kidney disease with stage 1 through stage 4 chronic kidney disease, or unspecified chronic kidney disease: Secondary | ICD-10-CM

## 2020-05-28 DIAGNOSIS — F419 Anxiety disorder, unspecified: Secondary | ICD-10-CM

## 2020-05-28 NOTE — Chronic Care Management (AMB) (Signed)
Chronic Care Management Note    Clinical Social Work Follow Up   05/28/2020 Name: Renee Bridges MRN: [id number] DOB: 09/01/1946  Renee Bridges is a 74 y.o. year old female who is a primary care patient of [name], DO. The CCM team was consulted for assistance with Level of Care Concerns.   Review of patient status, including review of consultants reports, other relevant assessments, and collaboration with appropriate care team members and the patient's provider was performed as part of comprehensive patient evaluation and provision of chronic care management services.    SDOH (Social Determinants of Health) assessments performed: Yes    Outpatient Encounter Medications as of 05/28/2020  Medication Sig  . atorvastatin (LIPITOR) 20 MG tablet Take 1 tablet (20 mg total) by mouth daily.  . QUEtiapine (SEROQUEL XR) 50 MG TB24 24 hr tablet Take 1 tablet (50 mg total) by mouth at bedtime.   No facility-administered encounter medications on file as of 05/28/2020.     Goals Addressed    .  SW: We need more help and support with Renee Bridges (pt-stated)        Current Barriers:  . Chronic Mental Health needs related to Depression and Grief . Financial constraints related to managing health care expenses . Limited social support . ADL IADL limitations . Mental Health Concerns  . Limited access to caregiver . Cognitive Deficits . Memory Deficits . Inability to perform ADL's independently . Inability to perform IADL's independently . Suicidal Ideation/Homicidal Ideation: No  Clinical Social Work Goal(s):  Marland Kitchen Over the next 120 days, patient will work with SW  bi-monthly  by telephone or in person to reduce or manage symptoms related to depression, grief, anxiety and ongoing stressors . Over the next 120 days, patient will demonstrate improved health management independence as evidenced by implementing positive self-care into her daily routines  Interventions: . Patient interviewed and  appropriate assessments performed: brief mental health assessment . Provided family with personal care service resource education . Granddaughter [name] reports that patient often isolates where she resides now and she feels that patient would thrive more if she were to relocate near her and her family. [name] reports living in [place] area. Family is wanting C3 referral for low income housing resources for patient.  . Granddaughter reports that patient was provided a medium sized dog after she lost her own but this dog was too big and could have knocked her over so they went back and got her a smaller dog which is more suitable to patient's needs.  . Patient interviewed and appropriate assessments performed . Provided mental health counseling with regard to grief  . Provided patient with information about available mental health resources within the nearby area . Discussed plans with patient for ongoing care management follow up and provided patient with direct contact information for care management team . Advised patient to contact CCM LCSW directly if case management needs arise . Collaborated with primary care provider re: Patient has new small dog that is helping her cope with the loss of her last one.  . Assisted patient/caregiver with obtaining information about health plan benefits . Provided education and assistance to client regarding Advanced Directives. . A voluntary and extensive discussion about advanced care planning including explanation and discussion of advanced was undertaken with the patient and [name] (caretaker). Explanation regarding healthcare proxy and living will was reviewed and packet with forms with explanation of how to fill them out was given.   Marland Kitchen  Provided education to patient/caregiver regarding level of care options. . Provided education to patient/caregiver about Hospice and/or Palliative Care services . The granddaughter feels the patient  is not taking her medications as directed. The granddaughter further states that the patient is "worked up" right now and there is a lot of family drama going on with the patients grandson and the patient having to get paperwork filled out for the [facility] so she can drive. The granddaughter does not feel like the patient needs to be driving and is very concerned. The patient has been avoiding talking to the granddaughter and depending on her to help in her care.   . The granddaughter is concerned about the patient driving. She feels if she is taking her medications as directed that it is not an issue; however if she goes out driving in a rage she is concerned that she will hurt herself or someone else.   . The patients granddaughter spoke with CCM LCSW for recommendations. She discovered that her grandmother had been scammed and has paid out >1000.00 to the scammers as they have contacted the patient by text messaging. The patient is getting ready to have her lights cut off because she has not paid her light bill. The granddaughter was seeking help with how to address the granddaughter taking over the patients finances. Education on talking to the patient and allowing her granddaughter to control her finances. Before the patients daughter did this by taking her monthly check, paying all the bills and giving the rest to the patient.  Advised the granddaughter to block the numbers off of the patients phone, tell the patient to discuss any text like this in the future before she reacts to them and discuss the best way for the patients finances to be handled. Care guide referral was completed by [facility] for financial resources. The granddaughter is going to pay the patient's light bill and will discuss with the patient the other ideas to come up with a solution to protect her in the future from being scammed. . Encouraged family to consider [facility] for grief follow up and therapy/counseling . Solution-Focused  Strategies education provided to family . LCSW filed APS report on patient with [facility] on 04/21/20 and included PCP, sister, granddaughter and CCM Nurse on report. LCSW spoke with granddaughter on 05/03/20 and was informed that she had been unable to receive a phone call back from patient's current APS caseworker Mauricio Po. LCSW completed call to APS caseworker Koleen Nimrod at [phone] and left a detailed voice message encouraging a return call to CFP and family in order to provide care coordination and receive update on current investigation. LCSW completed additional outreach attempt to APS caseworker on 05/28/20 and successfully received phone call back. She reports that she has spoke with patient's daughter [name] who is currently estranged from patient. She is agreeable to contact granddaughter today to provide update on case. However, she reports that she sent [facility] a fax on 05/05/20 requesting medical records but never heard back. LCSW sent secure message to [facility] front desk and PCP regarding this request. APS's fax number is [phone]. She reports that she will be making her decision on case soon which will led to case closure. CCM RNCM updated as well.   Patient Self Care Activities:  . Attends all scheduled provider appointments . Strong family or social support  Patient Coping Strengths:  . Family . Hopefulness  Patient Self Care Deficits:  . Lacks social connections . Unable to  perform ADLs independently . Unable to perform IADLs independently  Please see past updates related to this goal by clicking on the "Past Updates" button in the selected goal      Follow Up Plan: SW will follow up with patient by phone over the next 30-45 days  Dickie La, BSW, MSW, LCSW [facility] Care Management [place]  [facility] [email or web address] Phone: [phone]

## 2020-05-28 NOTE — Telephone Encounter (Signed)
  Chronic Care Management    Clinical Social Work General Follow Up Note  05/28/2020 Name: Renee Bridges MRN: [id number] DOB: 09/26/1945  Renee Bridges is a 74 y.o. year old female who is a primary care patient of [name], DO. The CCM team was consulted for assistance with Level of Care Concerns.   Review of patient status, including review of consultants reports, relevant laboratory and other test results, and collaboration with appropriate care team members and the patient's provider was performed as part of comprehensive patient evaluation and provision of chronic care management services.    LCSW completed CCM outreach attempt today to patient's APS caseworker at [phone]  but was unable to reach caseworker successfully. A HIPPA compliant voice message was left encouraging APS to return call once available to gain updates on patient's current case/report.  Outpatient Encounter Medications as of 05/28/2020  Medication Sig  . atorvastatin (LIPITOR) 20 MG tablet Take 1 tablet (20 mg total) by mouth daily.  . QUEtiapine (SEROQUEL XR) 50 MG TB24 24 hr tablet Take 1 tablet (50 mg total) by mouth at bedtime.   No facility-administered encounter medications on file as of 05/28/2020.    Follow Up Plan: SW will wait for return call from APS caseworker regarding patient's status on report that was filed. SW will follow up with patient or patient's caregiver by phone over the next quarter.    Dickie La, BSW, MSW, LCSW [facility] Care [facility] [place]  [facility] [email or web address] Phone: [phone]

## 2020-06-07 ENCOUNTER — Ambulatory Visit (INDEPENDENT_AMBULATORY_CARE_PROVIDER_SITE_OTHER): Payer: Medicare Other | Admitting: General Practice

## 2020-06-07 DIAGNOSIS — E782 Mixed hyperlipidemia: Secondary | ICD-10-CM

## 2020-06-07 DIAGNOSIS — F339 Major depressive disorder, recurrent, unspecified: Secondary | ICD-10-CM

## 2020-06-07 DIAGNOSIS — I10 Essential (primary) hypertension: Secondary | ICD-10-CM

## 2020-06-07 DIAGNOSIS — F419 Anxiety disorder, unspecified: Secondary | ICD-10-CM

## 2020-06-07 DIAGNOSIS — N183 Chronic kidney disease, stage 3 unspecified: Secondary | ICD-10-CM | POA: Diagnosis not present

## 2020-06-07 DIAGNOSIS — Z9189 Other specified personal risk factors, not elsewhere classified: Secondary | ICD-10-CM

## 2020-06-07 NOTE — Patient Instructions (Signed)
Visit Information  Goals Addressed            This Visit's Progress   . RNCM: Chronic Disease Managment and suppor       Current Barriers:  . Chronic Disease Management support, education, and care coordination needs related to HTN, HLD, CKD Stage 3, and Depression  Clinical Goal(s) related to HTN, HLD, CKD Stage 3, and Depression:  Over the next 120 days, patient will:  . Work with the care management team to address educational, disease management, and care coordination needs  . Begin or continue self health monitoring activities as directed today Measure and record blood pressure 2 times per week . Call provider office for new or worsened signs and symptoms Blood pressure findings outside established parameters, New or worsened symptom related to HTN, HLD, Chronic Kidney Disease 3, and depression . Call care management team with questions or concerns . Verbalize basic understanding of patient centered plan of care established today  Interventions related to HTN, HLD, CKD Stage 3, and Depression:  . Evaluation of current treatment plans and patient's adherence to plan as established by provider.  06-07-2020: The granddaughter feels the patient is not taking her medications as directed. The granddaughter further states that the patient is "worked up" right now and there is a lot of family drama going on with the patients grandson and the patient having to get paperwork filled out for the [facility] so she can drive. The granddaughter does not feel like the patient needs to be driving and is very concerned. The patient has been calling the [name] multiple times a day and texting. [name] was reaching out for help in management of her health and well being.  . Assessed patient understanding of disease states.  The granddaughter states she understands her chronic conditions but wants to talk to pcp about the patients mood changes and not acting age appropriate. The granddaughter is open to ideas and  recommendations from the care team. 06-07-2020:  The granddaughter continue to ask for recommendations on how to best help the patient. She is concerned about the patient driving. She feels if she is taking her medications as directed that it is not an issue; however if she goes out driving in a rage she is concerned that she will hurt herself or someone else.  The patient sees Dr. Laural Benes 06-14-2020 to get paperwork filled out for the [facility].  The paperwork has to be turned into the [facility] and they will notify the patient of the decision if she is still able to safely drive.  . Assessed patient's education and care coordination needs. 06-07-2020: Will touch base with the LCSW to follow up with the patient granddaughter for recommendations and help with anxiety and depression. . Evaluation of HTN/HLD/CKD/Depression/Anxiety: The patients granddaughter does not have any vital sign readings for the [facility]. Attempts to reach the patient were unsuccessful. Empathetic listening and support given. The granddaughter feels the patient is  not safe but does wish she would consider talking to a psychiatrist.  The patient has refused this in the past.  . Provided disease specific education to patient. Education on help and resources to help with her depression and anxiety. The patient seems to be doing better since getting a smaller dog. The patient seems to be taking her medication at this time.  [name] with appropriate clinical care team members regarding patient needs.  Pharmacy and LCSW ongoing support and help with managing health and well being of the patient.  Patient Self Care Activities related to HTN, HLD, CKD Stage 3, and Depression:  . Patient is unable to independently self-manage chronic health conditions  Please see past updates related to this goal by clicking on the "Past Updates" button in the selected goal      . RNCM: Tell Dr. Laural Benes I stopped taking that other medicine. It makes me jittery        Current Barriers:  Marland Kitchen Knowledge Deficits related to understanding the significance of taking medications as prescribed for chronic medical conditions because the patient "feels good" . Lacks caregiver support. Daughter [name] Deceased x 2 years, Daughter [name] not involved, has a granddaughter [name] that helps when she can . Literacy barriers . Transportation barriers . Non-adherence to scheduled provider appointments . Non-adherence to prescribed medication regimen . Cognitive Deficits . Chronic Disease Management support and education needs related to HTN, HLD, Chronic Kidney Disease stage 3 and Depression  Nurse Case Manager Clinical Goal(s):  Marland Kitchen Over the next 120 days, patient will work with pcp and CCM team  to address needs related to management of chronic disease processes and taking prescribed medications as directed  . Over the next 120 days, patient will demonstrate a decrease in depression  exacerbations as evidenced by of the patient taking medications as directed . Over the next 120 days, patient will demonstrate improved health management independence as evidenced by following recommendations by the care team to help the patient with her health and well being . Over the next 90 days, patient will work with CM team pharmacist to manage medications and compliance concerns . Over the next 90 days, patient will work with CM clinical social worker to gain effective coping skills for her depression and the loss of her daughter 2 years ago . Over the next 90 days, patient will work with care guide team to assist with low income housing closer to her granddaughter in [place] . Over the next 90 days, the patient will demonstrate ongoing self health care management ability as evidenced by following the recommendations of the pcp and CCM team  Interventions:  . Evaluation of current treatment plan related to HTN, HLD, Chronic Kidney disease, and depression and patient's adherence to plan as  established by provider. . Advised patient's granddaughter of appointment scheduled on 06-14-2020  The patients granddaughter is going to try to bring the patient to her appointment . Reviewed medications with patient and discussed noncompliance of medication regimen and the need to take medications as directed. 06-07-2020: The patients granddaughter states that the patient tells her she is taking her medication as directed but she feels the patient is lying to her about it. [name] is overwhelmed with the amount of times the patient is calling her and texting her daily. She states that she does not know what to do. She said the APS case worker called her and was rude to her and told her they could not do anything about the patient having poor judgment.  [name] is seeking help in how to best manage the care of the patient.  Marland Kitchen Collaborated with pcp and CCM team  regarding noncompliance of treatment regimen for chronic disease management.  06-07-2020: This is an ongoing issue with the patient per the patients granddaughter.  . Discussed plans with patient for ongoing care management follow up and provided patient with direct contact information for care management team . Care Guide referral for assistance in finding low income housing in [place] so the patient can be closer  to her granddaughter.  02-24-2020: the granddaughter verbalized she has checked into some places closer to her but the price is out of range. She is not sure she would move or not. The patient purchased a Zenaida Niece and has not told the granddaughter. The granddaughter says she has had a Zenaida Niece about 3 months. [name]'s brother Clide Cliff finally told her that the patient was allowing him to use it at this time until he could get his own transportation. [name] states that the patient has not been talking to her much and has been avoiding her for about 3 weeks. 06-07-2020: The patients granddaughter states that she does not know how much longer she can help  her grandmother in current state. She does not feel she is safe to drive and she is not taking her medications. She gets very easily upset. Social Worker is working with the patient and the patients granddaughter for depression and multiple issues related to grief and loss . Pharmacy referral for noncompliance to prescribed medication regimen- the patient confirmed she is not taking any medications because she "feels good" and she stopped taking that "other medicine" because it made her feel "jittery" (medication fluoxetine for her depression)  Patient Self Care Activities:  . Patient verbalizes understanding of plan to see the pcp in the next 30 days to evaluate health and wellbeing . Self administers medications as prescribed . Attends all scheduled provider appointments . Calls provider office for new concerns or questions . Unable to independently manage chronic disease processes  . Unable to self administer medications as prescribed . Does not adhere to provider recommendations re: care plan and taking medications as prescribed  . Lacks social connections  Please see past updates related to this goal by clicking on the "Past Updates" button in the selected goal         Patient verbalizes understanding of instructions provided today.   The care management team will reach out to the patient again over the next 30 to 60 days.   Alto Denver RN, MSN, CCM Community Care Coordinator [place]  [facility] Mobile: [phone]

## 2020-06-07 NOTE — Chronic Care Management (AMB) (Signed)
Chronic Care Management   Follow Up Note   06/07/2020 Name: Renee Bridges MRN: [id number] DOB: Dec 08, 1945  Referred by: [name], DO Reason for referral : Chronic Care Management (RNCM granddaughter had left a message and ask for a call back for Chronic Disease Management and Care Coordination Needs)   Renee Bridges is a 74 y.o. year old female who is a primary care patient of [name], DO. The CCM team was consulted for assistance with chronic disease management and care coordination needs.    Review of patient status, including review of consultants reports, relevant laboratory and other test results, and collaboration with appropriate care team members and the patient's provider was performed as part of comprehensive patient evaluation and provision of chronic care management services.    SDOH (Social Determinants of Health) assessments performed: Yes See Care Plan activities for detailed interventions related to [facility])     Outpatient Encounter Medications as of 06/07/2020  Medication Sig  . atorvastatin (LIPITOR) 20 MG tablet Take 1 tablet (20 mg total) by mouth daily.  . QUEtiapine (SEROQUEL XR) 50 MG TB24 24 hr tablet Take 1 tablet (50 mg total) by mouth at bedtime.   No facility-administered encounter medications on file as of 06/07/2020.     Objective:   Goals Addressed            This Visit's Progress   . RNCM: Chronic Disease Managment and suppor       Current Barriers:  . Chronic Disease Management support, education, and care coordination needs related to HTN, HLD, CKD Stage 3, and Depression  Clinical Goal(s) related to HTN, HLD, CKD Stage 3, and Depression:  Over the next 120 days, patient will:  . Work with the care management team to address educational, disease management, and care coordination needs  . Begin or continue self health monitoring activities as directed today Measure and record blood pressure 2 times per week . Call provider  office for new or worsened signs and symptoms Blood pressure findings outside established parameters, New or worsened symptom related to HTN, HLD, Chronic Kidney Disease 3, and depression . Call care management team with questions or concerns . Verbalize basic understanding of patient centered plan of care established today  Interventions related to HTN, HLD, CKD Stage 3, and Depression:  . Evaluation of current treatment plans and patient's adherence to plan as established by provider.  06-07-2020: The granddaughter feels the patient is not taking her medications as directed. The granddaughter further states that the patient is "worked up" right now and there is a lot of family drama going on with the patients grandson and the patient having to get paperwork filled out for the [facility] so she can drive. The granddaughter does not feel like the patient needs to be driving and is very concerned. The patient has been calling the [name] multiple times a day and texting. [name] was reaching out for help in management of her health and well being.  . Assessed patient understanding of disease states.  The granddaughter states she understands her chronic conditions but wants to talk to pcp about the patients mood changes and not acting age appropriate. The granddaughter is open to ideas and recommendations from the care team. 06-07-2020:  The granddaughter continue to ask for recommendations on how to best help the patient. She is concerned about the patient driving. She feels if she is taking her medications as directed that it is not an issue; however if  she goes out driving in a rage she is concerned that she will hurt herself or someone else.  The patient sees Dr. Laural Benes 06-14-2020 to get paperwork filled out for the [facility].  The paperwork has to be turned into the [facility] and they will notify the patient of the decision if she is still able to safely drive.  . Assessed patient's education and care coordination needs.  06-07-2020: Will touch base with the LCSW to follow up with the patient granddaughter for recommendations and help with anxiety and depression. . Evaluation of HTN/HLD/CKD/Depression/Anxiety: The patients granddaughter does not have any vital sign readings for the [facility]. Attempts to reach the patient were unsuccessful. Empathetic listening and support given. The granddaughter feels the patient is  not safe but does wish she would consider talking to a psychiatrist.  The patient has refused this in the past.  . Provided disease specific education to patient. Education on help and resources to help with her depression and anxiety. The patient seems to be doing better since getting a smaller dog. The patient seems to be taking her medication at this time.  [name] with appropriate clinical care team members regarding patient needs.  Pharmacy and LCSW ongoing support and help with managing health and well being of the patient.   Patient Self Care Activities related to HTN, HLD, CKD Stage 3, and Depression:  . Patient is unable to independently self-manage chronic health conditions  Please see past updates related to this goal by clicking on the "Past Updates" button in the selected goal      . RNCM: Tell Dr. Laural Benes I stopped taking that other medicine. It makes me jittery       Current Barriers:  Marland Kitchen Knowledge Deficits related to understanding the significance of taking medications as prescribed for chronic medical conditions because the patient "feels good" . Lacks caregiver support. Daughter [name] Deceased x 2 years, Daughter [name] not involved, has a granddaughter [name] that helps when she can . Literacy barriers . Transportation barriers . Non-adherence to scheduled provider appointments . Non-adherence to prescribed medication regimen . Cognitive Deficits . Chronic Disease Management support and education needs related to HTN, HLD, Chronic Kidney Disease stage 3 and Depression  Nurse  Case Manager Clinical Goal(s):  Marland Kitchen Over the next 120 days, patient will work with pcp and CCM team  to address needs related to management of chronic disease processes and taking prescribed medications as directed  . Over the next 120 days, patient will demonstrate a decrease in depression  exacerbations as evidenced by of the patient taking medications as directed . Over the next 120 days, patient will demonstrate improved health management independence as evidenced by following recommendations by the care team to help the patient with her health and well being . Over the next 90 days, patient will work with CM team pharmacist to manage medications and compliance concerns . Over the next 90 days, patient will work with CM clinical social worker to gain effective coping skills for her depression and the loss of her daughter 2 years ago . Over the next 90 days, patient will work with care guide team to assist with low income housing closer to her granddaughter in [place] . Over the next 90 days, the patient will demonstrate ongoing self health care management ability as evidenced by following the recommendations of the pcp and CCM team  Interventions:  . Evaluation of current treatment plan related to HTN, HLD, Chronic Kidney disease, and depression and  patient's adherence to plan as established by provider. . Advised patient's granddaughter of appointment scheduled on 06-14-2020  The patients granddaughter is going to try to bring the patient to her appointment . Reviewed medications with patient and discussed noncompliance of medication regimen and the need to take medications as directed. 06-07-2020: The patients granddaughter states that the patient tells her she is taking her medication as directed but she feels the patient is lying to her about it. [name] is overwhelmed with the amount of times the patient is calling her and texting her daily. She states that she does not know what to do. She said  the APS case worker called her and was rude to her and told her they could not do anything about the patient having poor judgment.  [name] is seeking help in how to best manage the care of the patient.  Marland Kitchen Collaborated with pcp and CCM team  regarding noncompliance of treatment regimen for chronic disease management.  06-07-2020: This is an ongoing issue with the patient per the patients granddaughter.  . Discussed plans with patient for ongoing care management follow up and provided patient with direct contact information for care management team . Care Guide referral for assistance in finding low income housing in [place] so the patient can be closer to her granddaughter.  02-24-2020: the granddaughter verbalized she has checked into some places closer to her but the price is out of range. She is not sure she would move or not. The patient purchased a Zenaida Niece and has not told the granddaughter. The granddaughter says she has had a Zenaida Niece about 3 months. [name]'s brother Clide Cliff finally told her that the patient was allowing him to use it at this time until he could get his own transportation. [name] states that the patient has not been talking to her much and has been avoiding her for about 3 weeks. 06-07-2020: The patients granddaughter states that she does not know how much longer she can help her grandmother in current state. She does not feel she is safe to drive and she is not taking her medications. She gets very easily upset. Social Worker is working with the patient and the patients granddaughter for depression and multiple issues related to grief and loss . Pharmacy referral for noncompliance to prescribed medication regimen- the patient confirmed she is not taking any medications because she "feels good" and she stopped taking that "other medicine" because it made her feel "jittery" (medication fluoxetine for her depression)  Patient Self Care Activities:  . Patient verbalizes understanding of plan to  see the pcp in the next 30 days to evaluate health and wellbeing . Self administers medications as prescribed . Attends all scheduled provider appointments . Calls provider office for new concerns or questions . Unable to independently manage chronic disease processes  . Unable to self administer medications as prescribed . Does not adhere to provider recommendations re: care plan and taking medications as prescribed  . Lacks social connections  Please see past updates related to this goal by clicking on the "Past Updates" button in the selected goal          Plan:   The care management team will reach out to the patient again over the next 30 to 60 days.   Alto Denver RN, MSN, CCM Community Care Coordinator [place]  [facility] Mobile: [phone]

## 2020-06-09 ENCOUNTER — Telehealth: Payer: Medicaid Other

## 2020-06-14 ENCOUNTER — Ambulatory Visit: Payer: Medicare Other | Admitting: Family Medicine

## 2020-06-24 ENCOUNTER — Encounter: Payer: Self-pay | Admitting: Family Medicine

## 2020-06-24 ENCOUNTER — Other Ambulatory Visit: Payer: Self-pay

## 2020-06-24 ENCOUNTER — Ambulatory Visit (INDEPENDENT_AMBULATORY_CARE_PROVIDER_SITE_OTHER): Payer: Medicare Other | Admitting: Family Medicine

## 2020-06-24 VITALS — BP 139/82 | HR 76 | Temp 98.0°F | Resp 16 | Ht 61.0 in | Wt 168.2 lb

## 2020-06-24 DIAGNOSIS — F419 Anxiety disorder, unspecified: Secondary | ICD-10-CM | POA: Diagnosis not present

## 2020-06-24 DIAGNOSIS — F339 Major depressive disorder, recurrent, unspecified: Secondary | ICD-10-CM

## 2020-06-24 DIAGNOSIS — Z23 Encounter for immunization: Secondary | ICD-10-CM

## 2020-06-24 MED ORDER — QUETIAPINE FUMARATE ER 50 MG PO TB24: 50.0000 mg | ORAL_TABLET | Freq: Every day | ORAL | 1 refills | Status: DC

## 2020-06-24 NOTE — Assessment & Plan Note (Signed)
Under good control on current regimen. Continue current regimen. Continue to monitor. Call with any concerns. Refills given. Recheck 3 months.   

## 2020-06-24 NOTE — Progress Notes (Signed)
BP 139/82 (BP Location: Left Arm, Patient Position: Sitting, Cuff Size: Normal)   Pulse 76   Temp 98 F (36.7 C) (Oral)   Resp 16   Ht 5\' 1"  (1.549 m)   Wt 168 lb 3.2 oz (76.3 kg)   LMP  (LMP Unknown)   SpO2 95%   BMI 31.78 kg/m    Subjective:    Patient ID: Renee Bridges, female    DOB: 24-Jun-1946, 74 y.o.   MRN: [id number]  HPI: Renee Bridges is a 74 y.o. female  Chief Complaint  Patient presents with  . Depression   Here today to have paperwork for the [facility]. She has not been talking to her granddaughter. She notes that her granddaughter is not speaking to her. She continues to be very worried about being able to drive. She states that she has been doing OK. She is feeling more like herself.   DEPRESSION Mood status: stable Satisfied with current treatment?: yes Symptom severity: moderate  Duration of current treatment : chronic Side effects: no Medication compliance: fair compliance Psychotherapy/counseling: no  Previous psychiatric medications: seroquel Depressed mood: yes Anxious mood: yes Anhedonia: no Significant weight loss or gain: no Insomnia: no  Fatigue: yes Feelings of worthlessness or guilt: no Impaired concentration/indecisiveness: no Suicidal ideations: no Hopelessness: no Crying spells: yes Depression screen [facility] 2/9 06/24/2020 06/24/2020 02/27/2020 12/15/2019 11/21/2019  Decreased Interest 0 0 0 0 0  Down, Depressed, Hopeless 0 0 1 0 0  PHQ - 2 Score 0 0 1 0 0  Altered sleeping 0 - 3 2 3   Tired, decreased energy 0 - 3 0 0  Change in appetite 0 - 1 0 2  Feeling bad or failure about yourself  0 - 0 0 0  Trouble concentrating 0 - 0 0 0  Moving slowly or fidgety/restless 0 - 3 2 2   Suicidal thoughts 0 - 0 0 0  PHQ-9 Score 0 - 11 4 7   Difficult doing work/chores Not difficult at all - Very difficult Very difficult Somewhat difficult  Some recent data might be hidden     Relevant past medical, surgical, family and social history reviewed and  updated as indicated. Interim medical history since our last visit reviewed. Allergies and medications reviewed and updated.  Review of Systems  Constitutional: Negative.   Respiratory: Negative.   Cardiovascular: Negative.   Gastrointestinal: Negative.   Musculoskeletal: Negative.   Skin: Negative.   Neurological: Negative.   Psychiatric/Behavioral: Positive for dysphoric mood. Negative for agitation, behavioral problems, confusion, decreased concentration, hallucinations, self-injury, sleep disturbance and suicidal ideas. The patient is nervous/anxious. The patient is not hyperactive.     Per HPI unless specifically indicated above     Objective:    BP 139/82 (BP Location: Left Arm, Patient Position: Sitting, Cuff Size: Normal)   Pulse 76   Temp 98 F (36.7 C) (Oral)   Resp 16   Ht 5\' 1"  (1.549 m)   Wt 168 lb 3.2 oz (76.3 kg)   LMP  (LMP Unknown)   SpO2 95%   BMI 31.78 kg/m   Wt Readings from Last 3 Encounters:  06/24/20 168 lb 3.2 oz (76.3 kg)  04/15/20 169 lb (76.7 kg)  04/05/20 169 lb (76.7 kg)    Physical Exam Vitals and nursing note reviewed.  Constitutional:      General: She is not in acute distress.    Appearance: Normal appearance. She is not ill-appearing, toxic-appearing or diaphoretic.  HENT:  Head: Normocephalic and atraumatic.     Right Ear: External ear normal.     Left Ear: External ear normal.     Nose: Nose normal.     Mouth/Throat:     Mouth: Mucous membranes are moist.     Pharynx: Oropharynx is clear.  Eyes:     General: No scleral icterus.       Right eye: No discharge.        Left eye: No discharge.     Extraocular Movements: Extraocular movements intact.     Conjunctiva/sclera: Conjunctivae normal.     Pupils: Pupils are equal, round, and reactive to light.  Cardiovascular:     Rate and Rhythm: Normal rate and regular rhythm.     Pulses: Normal pulses.     Heart sounds: Normal heart sounds. No murmur heard.  No friction rub. No  gallop.   Pulmonary:     Effort: Pulmonary effort is normal. No respiratory distress.     Breath sounds: Normal breath sounds. No stridor. No wheezing, rhonchi or rales.  Chest:     Chest wall: No tenderness.  Musculoskeletal:        General: Normal range of motion.     Cervical back: Normal range of motion and neck supple.  Skin:    General: Skin is warm and dry.     Capillary Refill: Capillary refill takes less than 2 seconds.     Coloration: Skin is not jaundiced or pale.     Findings: No bruising, erythema, lesion or rash.  Neurological:     General: No focal deficit present.     Mental Status: She is alert and oriented to person, place, and time. Mental status is at baseline.  Psychiatric:        Mood and Affect: Mood normal.        Behavior: Behavior normal.        Thought Content: Thought content normal.        Judgment: Judgment normal.     Results for orders placed or performed in visit on 01/12/20  Urine Culture   Specimen: Urine   UR  Result Value Ref Range   Urine Culture, Routine Final report    Organism ID, Bacteria Comment   Microscopic Examination   URINE  Result Value Ref Range   WBC, UA >30 (A) 0 - 5 /hpf   RBC 3-10 (A) 0 - 2 /hpf   Epithelial Cells (non renal) 0-10 0 - 10 /hpf   Mucus, UA Present Not Estab.   Bacteria, UA Moderate (A) None seen/Few  CBC with Differential/Platelet  Result Value Ref Range   WBC 6.1 3.4 - 10.8 x10E3/uL   RBC 4.96 3.77 - 5.28 x10E6/uL   Hemoglobin 15.5 11.1 - 15.9 g/dL   Hematocrit 52.7 (H) 78.2 - 46.6 %   MCV 95 79 - 97 fL   MCH 31.3 26.6 - 33.0 pg   MCHC 32.8 31 - 35 g/dL   RDW 42.3 53.6 - 14.4 %   Platelets 232 150 - 450 x10E3/uL   Neutrophils 50 Not Estab. %   Lymphs 38 Not Estab. %   Monocytes 9 Not Estab. %   Eos 2 Not Estab. %   Basos 1 Not Estab. %   Neutrophils Absolute 3.0 1.40 - 7.00 x10E3/uL   Lymphocytes Absolute 2.3 0 - 3 x10E3/uL   Monocytes Absolute 0.5 0 - 0 x10E3/uL   EOS (ABSOLUTE) 0.1 0.0 -  0.4 x10E3/uL   Basophils  Absolute 0.1 0 - 0 x10E3/uL   Immature Granulocytes 0 Not Estab. %   Immature Grans (Abs) 0.0 0.0 - 0.1 x10E3/uL  Comprehensive metabolic panel  Result Value Ref Range   Glucose 87 65 - 99 mg/dL   BUN 13 8 - 27 mg/dL   Creatinine, Ser 0.86 (H) 0.57 - 1.00 mg/dL   GFR calc non Af Amer 46 (L) >59 mL/min/1.73   GFR calc Af Amer 53 (L) >59 mL/min/1.73   BUN/Creatinine Ratio 11 (L) 12 - 28   Sodium 139 134 - 144 mmol/L   Potassium 4.7 3.5 - 5.2 mmol/L   Chloride 102 96 - 106 mmol/L   CO2 25 20 - 29 mmol/L   Calcium 9.8 8.7 - 10.3 mg/dL   Total Protein 6.7 6.0 - 8.5 g/dL   Albumin 4.2 3.7 - 4.7 g/dL   Globulin, Total 2.5 1.5 - 4.5 g/dL   Albumin/Globulin Ratio 1.7 1.2 - 2.2   Bilirubin Total 0.5 0.0 - 1.2 mg/dL   Alkaline Phosphatase 82 39 - 117 IU/L   AST 24 0 - 40 IU/L   ALT 16 0 - 32 IU/L  Lipid Panel w/o Chol/HDL Ratio  Result Value Ref Range   Cholesterol, Total 219 (H) 100 - 199 mg/dL   Triglycerides 578 (H) 0 - 149 mg/dL   HDL 43 >46 mg/dL   VLDL Cholesterol Cal 29 5 - 40 mg/dL   LDL Chol Calc (NIH) 962 (H) 0 - 99 mg/dL  Microalbumin, Urine Waived  Result Value Ref Range   Microalb, Ur Waived 30 (H) 0 - 19 mg/L   Creatinine, Urine Waived 50 10 - 300 mg/dL   Microalb/Creat Ratio 30-300 (H) <30 mg/g  TSH  Result Value Ref Range   TSH 2.850 0.450 - 4.500 uIU/mL  Urinalysis, Routine w reflex microscopic  Result Value Ref Range   Specific Gravity, UA 1.015 1.005 - 1.030   pH, UA 6.5 5.0 - 7.5   Color, UA Yellow Yellow   Appearance Ur Hazy (A) Clear   Leukocytes,UA 2+ (A) Negative   Protein,UA Negative Negative/Trace   Glucose, UA Negative Negative   Ketones, UA Negative Negative   RBC, UA 2+ (A) Negative   Bilirubin, UA Negative Negative   Urobilinogen, Ur 0.2 0.2 - 1.0 mg/dL   Nitrite, UA Negative Negative   Microscopic Examination See below:       Assessment & Plan:   Problem List Items Addressed This Visit      Other   Anxiety -  Primary    Under good control on current regimen. Continue current regimen. Continue to monitor. Call with any concerns. Refills given. Recheck 3 months.        Depression, recurrent (HCC)    Under good control on current regimen. Continue current regimen. Continue to monitor. Call with any concerns. Refills given. Recheck 3 months.            Follow up plan: Return in about 3 months (around 09/24/2020).

## 2020-06-29 ENCOUNTER — Telehealth: Payer: Medicaid Other | Admitting: General Practice

## 2020-06-29 ENCOUNTER — Ambulatory Visit: Payer: Self-pay | Admitting: General Practice

## 2020-06-29 DIAGNOSIS — E782 Mixed hyperlipidemia: Secondary | ICD-10-CM

## 2020-06-29 DIAGNOSIS — I10 Essential (primary) hypertension: Secondary | ICD-10-CM

## 2020-06-29 DIAGNOSIS — N183 Chronic kidney disease, stage 3 unspecified: Secondary | ICD-10-CM | POA: Diagnosis not present

## 2020-06-29 DIAGNOSIS — F339 Major depressive disorder, recurrent, unspecified: Secondary | ICD-10-CM

## 2020-06-29 DIAGNOSIS — F419 Anxiety disorder, unspecified: Secondary | ICD-10-CM

## 2020-06-29 NOTE — Patient Instructions (Signed)
Visit Information  Goals Addressed            This Visit's Progress    RNCM: Chronic Disease Managment and suppor       Current Barriers:   Chronic Disease Management support, education, and care coordination needs related to HTN, HLD, CKD Stage 3, and Depression  Clinical Goal(s) related to HTN, HLD, CKD Stage 3, and Depression:  Over the next 120 days, patient will:   Work with the care management team to address educational, disease management, and care coordination needs   Begin or continue self health monitoring activities as directed today Measure and record blood pressure 2 times per week  Call provider office for new or worsened signs and symptoms Blood pressure findings outside established parameters, New or worsened symptom related to HTN, HLD, Chronic Kidney Disease 3, and depression  Call care management team with questions or concerns  Verbalize basic understanding of patient centered plan of care established today  Interventions related to HTN, HLD, CKD Stage 3, and Depression:   Evaluation of current treatment plans and patient's adherence to plan as established by provider.  06-07-2020: The granddaughter feels the patient is not taking her medications as directed. The granddaughter further states that the patient is "worked up" right now and there is a lot of family drama going on with the patients grandson and the patient having to get paperwork filled out for the [facility] so she can drive. The granddaughter does not feel like the patient needs to be driving and is very concerned. The patient has been calling the [name] multiple times a day and texting. [name] was reaching out for help in management of her health and well being. 06-29-2020: [name] states that the patient is stable right now and seems to be doing well. Was only not able to pay her phone bill for this month but she will be able to pay for it next week. She had been doing better with her money but she goes to  [employer] a lot. The granddaughter states she saw family recently and that always helps her mood. She also successfully completed her appointment with Dr. Laural Benes on 06-24-2020.    Assessed patient understanding of disease states.  The granddaughter states she understands her chronic conditions but wants to talk to pcp about the patients mood changes and not acting age appropriate. The granddaughter is open to ideas and recommendations from the care team. 06-07-2020:  The granddaughter continue to ask for recommendations on how to best help the patient. She is concerned about the patient driving. She feels if she is taking her medications as directed that it is not an issue; however if she goes out driving in a rage she is concerned that she will hurt herself or someone else.  The patient sees Dr. Laural Benes 06-14-2020 to get paperwork filled out for the [facility].  The paperwork has to be turned into the [facility] and they will notify the patient of the decision if she is still able to safely drive. 06-29-2020: Competed pcp visit for paperwork for [facility].  The patient came on her own. The patient has ask her granddaughter [name] to take her to her cardiologist appointment coming up next week.   Assessed patient's education and care coordination needs. 06-07-2020: Will touch base with the LCSW to follow up with the patient granddaughter for recommendations and help with anxiety and depression.  Evaluation of HTN/HLD/CKD/Depression/Anxiety: The patients granddaughter does not have any vital sign readings for the [facility].  Attempts to reach the patient were unsuccessful. Empathetic listening and support given. The granddaughter feels the patient is  not safe but does wish she would consider talking to a psychiatrist.  The patient has refused this in the past.   Provided disease specific education to patient. Education on help and resources to help with her depression and anxiety. The patient seems to be doing better since  getting a smaller dog. The patient seems to be taking her medication at this time.   Collaborated with appropriate clinical care team members regarding patient needs.  Pharmacy and LCSW ongoing support and help with managing health and well being of the patient.   Patient Self Care Activities related to HTN, HLD, CKD Stage 3, and Depression:   Patient is unable to independently self-manage chronic health conditions  Please see past updates related to this goal by clicking on the "Past Updates" button in the selected goal       RNCM: Tell Dr. Laural Benes I stopped taking that other medicine. It makes me jittery       Current Barriers:   Knowledge Deficits related to understanding the significance of taking medications as prescribed for chronic medical conditions because the patient "feels good"  Lacks caregiver support. Daughter [name] Deceased x 2 years, Daughter [name] not involved, has a granddaughter [name] that helps when she can  Literacy barriers  Transportation barriers  Non-adherence to scheduled provider appointments  Non-adherence to prescribed medication regimen  Cognitive Deficits  Chronic Disease Management support and education needs related to HTN, HLD, Chronic Kidney Disease stage 3 and Depression  Nurse Case Manager Clinical Goal(s):   Over the next 120 days, patient will work with pcp and CCM team  to address needs related to management of chronic disease processes and taking prescribed medications as directed   Over the next 120 days, patient will demonstrate a decrease in depression  exacerbations as evidenced by of the patient taking medications as directed  Over the next 120 days, patient will demonstrate improved health management independence as evidenced by following recommendations by the care team to help the patient with her health and well being  Over the next 90 days, patient will work with CM team pharmacist to manage medications and compliance  concerns  Over the next 90 days, patient will work with CM clinical social worker to gain effective coping skills for her depression and the loss of her daughter 2 years ago  Over the next 90 days, patient will work with care guide team to assist with low income housing closer to her granddaughter in [place]  Over the next 90 days, the patient will demonstrate ongoing self health care management ability as evidenced by following the recommendations of the pcp and CCM team  Interventions:   Evaluation of current treatment plan related to HTN, HLD, Chronic Kidney disease, and depression and patient's adherence to plan as established by provider.  Advised patient's granddaughter of appointment scheduled on 06-14-2020  The patients granddaughter is going to try to bring the patient to her appointment  Reviewed medications with patient and discussed noncompliance of medication regimen and the need to take medications as directed. 06-07-2020: The patients granddaughter states that the patient tells her she is taking her medication as directed but she feels the patient is lying to her about it. [name] is overwhelmed with the amount of times the patient is calling her and texting her daily. She states that she does not know what to do. She said  the APS case worker called her and was rude to her and told her they could not do anything about the patient having poor judgment.  [name] is seeking help in how to best manage the care of the patient. 06-29-2020: Currently the patient is stable at this time. The patients granddaughter states that she has been in a good mood lately.  Her nephew just had his first birthday party and she was with family. The patient has not been as interactive with [name] as they had an argument but she did ask [name] to take her next week to her cardiology appointment.   Collaborated with pcp and CCM team  regarding noncompliance of treatment regimen for chronic disease  management.  06-29-2020: This is an ongoing issue with the patient per the patients granddaughter.   Discussed plans with patient for ongoing care management follow up and provided patient with direct contact information for care management team  Care Guide referral for assistance in finding low income housing in [place] so the patient can be closer to her granddaughter.  02-24-2020: the granddaughter verbalized she has checked into some places closer to her but the price is out of range. She is not sure she would move or not. The patient purchased a Zenaida Niece and has not told the granddaughter. The granddaughter says she has had a Zenaida Niece about 3 months. [name]'s brother Clide Cliff finally told her that the patient was allowing him to use it at this time until he could get his own transportation. [name] states that the patient has not been talking to her much and has been avoiding her for about 3 weeks. 06-07-2020: The patients granddaughter states that she does not know how much longer she can help her grandmother in current state. She does not feel she is safe to drive and she is not taking her medications. She gets very easily upset. Social Worker is working with the patient and the patients granddaughter for depression and multiple issues related to grief and loss.  06-29-2020: Currently the patient is stable. No new concerns at this time. Will continue to monitor.   Pharmacy referral for noncompliance to prescribed medication regimen- the patient confirmed she is not taking any medications because she "feels good" and she stopped taking that "other medicine" because it made her feel "jittery" (medication fluoxetine for her depression)  Patient Self Care Activities:   Patient verbalizes understanding of plan to see the pcp in the next 30 days to evaluate health and wellbeing  Self administers medications as prescribed  Attends all scheduled provider appointments  Calls provider office for new concerns or  questions  Unable to independently manage chronic disease processes   Unable to self administer medications as prescribed  Does not adhere to provider recommendations re: care plan and taking medications as prescribed   Lacks social connections  Please see past updates related to this goal by clicking on the "Past Updates" button in the selected goal         Patient verbalizes understanding of instructions provided today.   Telephone follow up appointment with care management team member scheduled for: 08-10-2020 at 1:45 pm  Alto Denver RN, MSN, CCM Community Care Coordinator [place]   [facility] Mobile: [phone]

## 2020-06-29 NOTE — Chronic Care Management (AMB) (Signed)
Chronic Care Management   Follow Up Note   06/29/2020 Name: Renee Bridges MRN: [id number] DOB: 1945/10/23  Referred by: Renee Carrow, DO Reason for referral : Chronic Care Management (RNCM Follow up Call for Chronic Disease Management and Care Coordination Needs)   Renee Bridges is a 74 y.o. year old female who is a primary care patient of Renee Carrow, DO. The CCM team was consulted for assistance with chronic disease management and care coordination needs.    Review of patient status, including review of consultants reports, relevant laboratory and other test results, and collaboration with appropriate care team members and the patient's provider was performed as part of comprehensive patient evaluation and provision of chronic care management services.    SDOH (Social Determinants of Health) assessments performed: Yes See Care Plan activities for detailed interventions related to Renee Bridges)     Outpatient Encounter Medications as of 06/29/2020  Medication Sig   atorvastatin (LIPITOR) 20 MG tablet Take 1 tablet (20 mg total) by mouth daily.   QUEtiapine (SEROQUEL XR) 50 MG TB24 24 hr tablet Take 1 tablet (50 mg total) by mouth at bedtime.   No facility-administered encounter medications on file as of 06/29/2020.     Objective:  BP Readings from Last 3 Encounters:  06/24/20 139/82  04/05/20 (!) 112/86  02/27/20 125/86    Goals Addressed            This Visit's Progress    RNCM: Chronic Disease Managment and suppor       Current Barriers:   Chronic Disease Management support, education, and care coordination needs related to HTN, HLD, CKD Stage 3, and Depression  Clinical Goal(s) related to HTN, HLD, CKD Stage 3, and Depression:  Over the next 120 days, patient will:   Work with the care management team to address educational, disease management, and care coordination needs   Begin or continue self health monitoring activities as directed today Measure and  record blood pressure 2 times per week  Call provider office for new or worsened signs and symptoms Blood pressure findings outside established parameters, New or worsened symptom related to HTN, HLD, Chronic Kidney Disease 3, and depression  Call care management team with questions or concerns  Verbalize basic understanding of patient centered plan of care established today  Interventions related to HTN, HLD, CKD Stage 3, and Depression:   Evaluation of current treatment plans and patient's adherence to plan as established by provider.  06-07-2020: The granddaughter feels the patient is not taking her medications as directed. The granddaughter further states that the patient is "worked up" right now and there is a lot of family drama going on with the patients grandson and the patient having to get paperwork filled out for the [facility] so she can drive. The granddaughter does not feel like the patient needs to be driving and is very concerned. The patient has been calling the Renee Bridges multiple times a day and texting. Renee Bridges was reaching out for help in management of her health and well being. 06-29-2020: Renee Bridges states that the patient is stable right now and seems to be doing well. Was only not able to pay her phone bill for this month but she will be able to pay for it next week. She had been doing better with her money but she goes to [employer] a lot. The granddaughter states she saw family recently and that always helps her mood. She also successfully completed her appointment with [name] on 06-24-2020.    Assessed patient understanding of disease states.  The granddaughter states she understands her chronic conditions but wants to talk to pcp about the patients mood changes and not acting age appropriate. The granddaughter is open to ideas and recommendations from the care team. 06-07-2020:  The granddaughter continue to ask for recommendations on how to best help the patient. She is concerned  about the patient driving. She feels if she is taking her medications as directed that it is not an issue; however if she goes out driving in a rage she is concerned that she will hurt herself or someone else.  The patient sees Dr. Laural Bridges 06-14-2020 to [facility]  [facility] and they will notify the patient of the decision if she is still able to safely drive. 06-29-2020: Competed pcp visit for paperwork for [facility].  The patient came on her own. The patient has ask her granddaughter Renee BumpsJessica to take her to her cardiologist appointment coming up next week.   Assessed patient's education and care coordination needs. 06-07-2020: Will touch base with the LCSW to follow up with the patient granddaughter for recommendations and help with anxiety and depression.  Evaluation of HTN/HLD/CKD/Depression/Anxiety: The patients [facility] Attempts to reach the patient were unsuccessful. Empathetic listening and support given. The granddaughter feels the patient is  not safe but does wish she would consider talking to a psychiatrist.  The patient has refused this in the past.   Provided disease specific education to patient. Education on help and resources to help with her depression and anxiety. The patient seems to be doing better since getting a smaller dog. The patient seems to be taking her medication at this time.   Collaborated with appropriate clinical care team members regarding patient needs.  Pharmacy and LCSW ongoing support and help with managing health and well being of the patient.   Patient Self Care Activities related to HTN, HLD, CKD Stage 3, and Depression:   Patient is unable to independently self-manage chronic health conditions  Please see past updates related to this goal by clicking on the "Past Updates" button in the selected goal       RNCM: Tell Dr. Laural Bridges I stopped taking that  other medicine. It makes me jittery       Current Barriers:   Knowledge Deficits related to understanding the significance of taking medications as prescribed for chronic medical conditions because the patient "feels good"  Lacks caregiver support. Daughter Andrey CampanileSandy Deceased x 2 years, Daughter Cordelia PenSherry not involved, has a granddaughter Renee BumpsJessica that helps when she can  Literacy barriers  Transportation barriers  Non-adherence to scheduled provider appointments  Non-adherence to prescribed medication regimen  Cognitive Deficits  Chronic Disease Management support and education needs related to HTN, HLD, Chronic Kidney Disease stage 3 and Depression  Nurse Case Manager Clinical Goal(s):   Over the next 120 days, patient will work with pcp and CCM team  to address needs related to management of chronic disease processes and taking prescribed medications as directed   Over the next 120 days, patient will demonstrate a decrease in depression  exacerbations as evidenced by of the patient taking medications as directed  Over the next 120 days, patient will demonstrate improved health management independence as evidenced by following recommendations by the care team to help the patient with her health and well  being  Over the next 90 days, patient will work with CM team pharmacist to manage medications and compliance concerns  Over the next 90 days, patient will work with CM clinical social worker to gain effective coping skills for her depression and the loss of her daughter 2 years ago  Over the next 90 days, patient will work with care guide team to assist with low income housing closer to her granddaughter in [place]  Over the next 90 days, the patient will demonstrate ongoing self health care management ability as evidenced by following the recommendations of the pcp and CCM team  Interventions:   Evaluation of current treatment plan related to HTN, HLD, Chronic Kidney disease, and  depression and patient's adherence to plan as established by provider.  Advised patient's granddaughter of appointment scheduled on 06-14-2020  The patients granddaughter is going to try to bring the patient to her appointment  Reviewed medications with patient and discussed noncompliance of medication regimen and the need to take medications as directed. 06-07-2020: The patients granddaughter states that the patient tells her she is taking her medication as directed but she feels the patient is lying to her about it. Renee Bridges is overwhelmed with the amount of times the patient is calling her and texting her daily. She states that she does not know what to do. She said the APS case worker called her and was rude to her and told her they could not do anything about the patient having poor judgment.  Renee Bridges is seeking help in how to best manage the care of the patient. 06-29-2020: Currently the patient is stable at this time. The patients granddaughter states that she has been in a good mood lately.  Her nephew just had his first birthday party and she was with family. The patient has not been as interactive with Renee Bridges as they had an argument but she did ask Renee Bridges to take her next week to her cardiology appointment.   Collaborated with pcp and CCM team  regarding noncompliance of treatment regimen for chronic disease management.  06-29-2020: This is an ongoing issue with the patient per the patients granddaughter.   Discussed plans with patient for ongoing care management follow up and provided patient with direct contact information for care management team  Care Guide referral for assistance in finding low income housing in [place] so the patient can be closer to her granddaughter.  02-24-2020: the granddaughter verbalized she has checked into some places closer to her but the price is out of range. She is not sure she would move or not. The patient purchased a Zenaida Niece and has not told the granddaughter.  The granddaughter says she has had a Zenaida Niece about 3 months. Renee Bridges's brother Clide Cliff finally told her that the patient was allowing him to use it at this time until he could get his own transportation. Renee Bridges states that the patient has not been talking to her much and has been avoiding her for about 3 weeks. 06-07-2020: The patients granddaughter states that she does not know how much longer she can help her grandmother in current state. She does not feel she is safe to drive and she is not taking her medications. She gets very easily upset. Social Worker is working with the patient and the patients granddaughter for depression and multiple issues related to grief and loss.  06-29-2020: Currently the patient is stable. No new concerns at this time. Will continue to monitor.   Pharmacy referral for noncompliance to prescribed  medication regimen- the patient confirmed she is not taking any medications because she "feels good" and she stopped taking that "other medicine" because it made her feel "jittery" (medication fluoxetine for her depression)  Patient Self Care Activities:   Patient verbalizes understanding of plan to see the pcp in the next 30 days to evaluate health and wellbeing  Self administers medications as prescribed  Attends all scheduled provider appointments  Calls provider office for new concerns or questions  Unable to independently manage chronic disease processes   Unable to self administer medications as prescribed  Does not adhere to provider recommendations re: care plan and taking medications as prescribed   Lacks social connections  Please see past updates related to this goal by clicking on the "Past Updates" button in the selected goal          Plan:   Telephone follow up appointment with care management team member scheduled for: 08-10-2020 at 1:45 pm   Alto Denver RN, MSN, CCM Community Care Coordinator [place]   [facility] Mobile: [phone]

## 2020-07-06 ENCOUNTER — Ambulatory Visit: Payer: Medicare Other | Admitting: Cardiology

## 2020-07-09 ENCOUNTER — Ambulatory Visit: Payer: Medicare Other | Admitting: Licensed Clinical Social Worker

## 2020-07-09 DIAGNOSIS — N183 Chronic kidney disease, stage 3 unspecified: Secondary | ICD-10-CM

## 2020-07-09 DIAGNOSIS — I10 Essential (primary) hypertension: Secondary | ICD-10-CM

## 2020-07-09 DIAGNOSIS — F419 Anxiety disorder, unspecified: Secondary | ICD-10-CM

## 2020-07-09 DIAGNOSIS — E782 Mixed hyperlipidemia: Secondary | ICD-10-CM

## 2020-07-09 DIAGNOSIS — F339 Major depressive disorder, recurrent, unspecified: Secondary | ICD-10-CM

## 2020-07-09 NOTE — Chronic Care Management (AMB) (Signed)
Chronic Care Management    Clinical Social Work Follow Up Note  07/09/2020 Name: Renee Bridges MRN: [id number] DOB: 1946/08/15  Renee Bridges is a 74 y.o. year old female who is a primary care patient of Renee Carrow, DO. The CCM team was consulted for assistance with Level of Care Concerns.   Review of patient status, including review of consultants reports, other relevant assessments, and collaboration with appropriate care team members and the patient's provider was performed as part of comprehensive patient evaluation and provision of chronic care management services.    SDOH (Social Determinants of Health) assessments performed: Yes    Outpatient Encounter Medications as of 07/09/2020  Medication Sig  . atorvastatin (LIPITOR) 20 MG tablet Take 1 tablet (20 mg total) by mouth daily.  . QUEtiapine (SEROQUEL XR) 50 MG TB24 24 hr tablet Take 1 tablet (50 mg total) by mouth at bedtime.   No facility-administered encounter medications on file as of 07/09/2020.     Goals Addressed    .  SW: We need more help and support with Renee Bridges (pt-stated)        Current Barriers:  . Chronic Mental Health needs related to Depression and Grief . Financial constraints related to managing health care expenses . Limited social support . ADL IADL limitations . Mental Health Concerns  . Limited access to caregiver . Cognitive Deficits . Memory Deficits . Inability to perform ADL's independently . Inability to perform IADL's independently . Suicidal Ideation/Homicidal Ideation: No  Clinical Social Work Goal(s):  Renee Bridges Kitchen Over the next 120 days, patient will work with SW  bi-monthly  by telephone or in person to reduce or manage symptoms related to depression, grief, anxiety and ongoing stressors . Over the next 120 days, patient will demonstrate improved health management independence as evidenced by implementing positive self-care into her daily routines  Interventions: . Patient interviewed and  appropriate assessments performed: brief mental health assessment . LCSW completed call to patient's caregiver/granddaughter Renee Bridges and provided family with personal care service resource education . Patient was deemed competent by APS after LCSW filed cased back in August of 2021. . Granddaughter Renee Bridges reports that patient often isolates where she resides now and she feels that patient would thrive more if she were to relocate near her and her family in order to gain socialization. Renee Bridges reports living in [place] area. Family is wanting C3 referral for low income housing resources for patient to relocate there. Resource education has been provided to family by C3 Guide. . Provided patient with information about available mental health resources within the nearby area . Discussed plans with patient for ongoing care management follow up and provided patient with direct contact information for care management team . Advised patient to contact CCM LCSW directly if case management needs arise . Collaborated with primary care provider re: Patient has new small dog that is helping her cope with the loss of her last one but granddaughter reports concern around her ability to take appropriate care of this dog. LCSW reported this concern to APS previously.  . Assisted patient/caregiver with obtaining information about health plan benefits . Provided education and assistance to client regarding Advanced Directives. . A voluntary and extensive discussion about advanced care planning including explanation and discussion of advanced was undertaken with the patient and Renee Bridges (caretaker). Explanation regarding healthcare proxy and living will was reviewed and packet with forms with explanation of how to fill them out was given.   . Provided  education to patient/caregiver regarding level of care options. . Provided education to patient/caregiver about Hospice and/or Palliative Care  services . The granddaughter feels the patient is not taking her medications as directed. The granddaughter does not feel like the patient needs to be driving but patient continues to do so. The patient sometimes avoids talking to granddaughter because of ongoing conflict with granddaughter being concerned about her current level of care.  . The granddaughter is concerned about the patient driving. She feels if she is taking her medications as directed that it is not an issue; however if she goes out driving in a rage she is concerned that she will hurt herself or someone else.   . The patients granddaughter reports ongoing concern for patient's ability to manager her own finances. Patient was previously put out of $1000 due to scam calls.  . Encouraged family to consider [facility] for grief follow up and therapy/counseling . Solution-Focused Strategies education provided to family . LCSW filed APS report on patient with [facility] on 04/21/20 and included PCP, sister, granddaughter and CCM Nurse on report. APS found patient to be competent so although patient is not making the best decisions regarding her care, she has the right do so. Granddaughter shares that patient is stable for now. . Patient received recent socialization at a family's birthday party which elevated her mood.    Patient Self Care Activities:  . Attends all scheduled provider appointments . Strong family or social support  Patient Coping Strengths:  . Family . Hopefulness  Patient Self Care Deficits:  . Lacks social connections . Unable to perform ADLs independently . Unable to perform IADLs independently  Please see past updates related to this goal by clicking on the "Past Updates" button in the selected goal      Follow Up Plan: SW will follow up with patient by phone over the next quarter  Dickie La, BSW, MSW, LCSW [facility] [place]  [facility] [email or web address] Phone: [phone]

## 2020-07-13 ENCOUNTER — Ambulatory Visit (INDEPENDENT_AMBULATORY_CARE_PROVIDER_SITE_OTHER): Payer: Medicare Other | Admitting: Family Medicine

## 2020-07-13 ENCOUNTER — Encounter: Payer: Self-pay | Admitting: Family Medicine

## 2020-07-13 VITALS — BP 129/91 | HR 86 | Temp 97.3°F | Wt 163.0 lb

## 2020-07-13 DIAGNOSIS — J209 Acute bronchitis, unspecified: Secondary | ICD-10-CM

## 2020-07-13 DIAGNOSIS — Z20822 Contact with and (suspected) exposure to covid-19: Secondary | ICD-10-CM

## 2020-07-13 MED ORDER — DOXYCYCLINE HYCLATE 100 MG PO TABS: 100.0000 mg | ORAL_TABLET | Freq: Two times a day (BID) | ORAL | 0 refills | Status: DC

## 2020-07-13 MED ORDER — HYDROCOD POLST-CPM POLST ER 10-8 MG/5ML PO SUER: 5.0000 mL | Freq: Every evening | ORAL | 0 refills | Status: DC | PRN

## 2020-07-13 MED ORDER — BENZONATATE 200 MG PO CAPS: 200.0000 mg | ORAL_CAPSULE | Freq: Two times a day (BID) | ORAL | 0 refills | Status: DC | PRN

## 2020-07-13 MED ORDER — PREDNISONE 10 MG PO TABS
ORAL_TABLET | ORAL | 0 refills | Status: DC
Start: 2020-07-13 — End: 2020-10-04

## 2020-07-13 NOTE — Addendum Note (Signed)
Addended by: Pablo Ledger on: 07/13/2020 04:33 PM   Modules accepted: Orders

## 2020-07-13 NOTE — Progress Notes (Signed)
BP (!) 129/91   Pulse 86   Temp (!) 97.3 F (36.3 C)   Wt 163 lb (73.9 kg)   LMP  (LMP Unknown)   BMI 30.80 kg/m    Subjective:    Patient ID: Renee Bridges, female    DOB: 1946-06-24, 74 y.o.   MRN: [id number]  HPI: Renee Bridges is a 74 y.o. female  Chief Complaint  Patient presents with  . Cough    pt states she has been coughing for about 2 weeks   . Wheezing    pt states she has chest congestion and can hear herself wheezing, pt has tried nyquil otc but doesn't seem to help   UPPER RESPIRATORY TRACT INFECTION Duration: 2 weeks, felt like she was sick, then got better, then got worse again Worst symptom: cough, fever Fever: yes - at the beginning, not now Cough: yes Shortness of breath: no Wheezing: yes Chest pain: just at the beginning Chest tightness: yes Chest congestion: yes Nasal congestion: yes Runny nose: yes Post nasal drip: yes Sneezing: yes Sore throat: no Swollen glands: no Sinus pressure: no Headache: yes Face pain: no Toothache: no Ear pain: no  Ear pressure: no  Eyes red/itching:no Eye drainage/crusting: no  Vomiting: no Rash: no Fatigue: yes Sick contacts: yes Strep contacts: no  Context: better Recurrent sinusitis: no Relief with OTC cold/cough medications: no  Treatments attempted: none    Relevant past medical, surgical, family and social history reviewed and updated as indicated. Interim medical history since our last visit reviewed. Allergies and medications reviewed and updated.  Review of Systems  Constitutional: Positive for fatigue. Negative for activity change, appetite change, chills, diaphoresis, fever and unexpected weight change.  HENT: Positive for congestion, postnasal drip and rhinorrhea. Negative for dental problem, drooling, ear discharge, ear pain, facial swelling, hearing loss, mouth sores, nosebleeds, sinus pressure, sinus pain, sneezing, sore throat, tinnitus, trouble swallowing and voice change.     Respiratory: Positive for cough, chest tightness and wheezing. Negative for apnea, choking, shortness of breath and stridor.   Cardiovascular: Negative.   Gastrointestinal: Negative.   Genitourinary: Negative.   Musculoskeletal: Negative.   Neurological: Positive for headaches.  Psychiatric/Behavioral: Negative.     Per HPI unless specifically indicated above     Objective:    BP (!) 129/91   Pulse 86   Temp (!) 97.3 F (36.3 C)   Wt 163 lb (73.9 kg)   LMP  (LMP Unknown)   BMI 30.80 kg/m   Wt Readings from Last 3 Encounters:  07/13/20 163 lb (73.9 kg)  06/24/20 168 lb 3.2 oz (76.3 kg)  04/15/20 169 lb (76.7 kg)    Physical Exam Vitals and nursing note reviewed.  Pulmonary:     Effort: Pulmonary effort is normal. No respiratory distress.     Comments: Speaking in full sentences Neurological:     Mental Status: She is alert.  Psychiatric:        Mood and Affect: Mood normal.        Behavior: Behavior normal.        Thought Content: Thought content normal.        Judgment: Judgment normal.     Results for orders placed or performed in visit on 01/12/20  Urine Culture   Specimen: Urine   UR  Result Value Ref Range   Urine Culture, Routine Final report    Organism ID, Bacteria Comment   Microscopic Examination   URINE  Result Value Ref  Range   WBC, UA >30 (A) 0 - 5 /hpf   RBC 3-10 (A) 0 - 2 /hpf   Epithelial Cells (non renal) 0-10 0 - 10 /hpf   Mucus, UA Present Not Estab.   Bacteria, UA Moderate (A) None seen/Few  CBC with Differential/Platelet  Result Value Ref Range   WBC 6.1 3.4 - 10.8 x10E3/uL   RBC 4.96 3.77 - 5.28 x10E6/uL   Hemoglobin 15.5 11.1 - 15.9 g/dL   Hematocrit 57.3 (H) 22.0 - 46.6 %   MCV 95 79 - 97 fL   MCH 31.3 26.6 - 33.0 pg   MCHC 32.8 31 - 35 g/dL   RDW 25.4 27.0 - 62.3 %   Platelets 232 150 - 450 x10E3/uL   Neutrophils 50 Not Estab. %   Lymphs 38 Not Estab. %   Monocytes 9 Not Estab. %   Eos 2 Not Estab. %   Basos 1 Not  Estab. %   Neutrophils Absolute 3.0 1.40 - 7.00 x10E3/uL   Lymphocytes Absolute 2.3 0 - 3 x10E3/uL   Monocytes Absolute 0.5 0 - 0 x10E3/uL   EOS (ABSOLUTE) 0.1 0.0 - 0.4 x10E3/uL   Basophils Absolute 0.1 0 - 0 x10E3/uL   Immature Granulocytes 0 Not Estab. %   Immature Grans (Abs) 0.0 0.0 - 0.1 x10E3/uL  Comprehensive metabolic panel  Result Value Ref Range   Glucose 87 65 - 99 mg/dL   BUN 13 8 - 27 mg/dL   Creatinine, Ser 7.62 (H) 0.57 - 1.00 mg/dL   GFR calc non Af Amer 46 (L) >59 mL/min/1.73   GFR calc Af Amer 53 (L) >59 mL/min/1.73   BUN/Creatinine Ratio 11 (L) 12 - 28   Sodium 139 134 - 144 mmol/L   Potassium 4.7 3.5 - 5.2 mmol/L   Chloride 102 96 - 106 mmol/L   CO2 25 20 - 29 mmol/L   Calcium 9.8 8.7 - 10.3 mg/dL   Total Protein 6.7 6.0 - 8.5 g/dL   Albumin 4.2 3.7 - 4.7 g/dL   Globulin, Total 2.5 1.5 - 4.5 g/dL   Albumin/Globulin Ratio 1.7 1.2 - 2.2   Bilirubin Total 0.5 0.0 - 1.2 mg/dL   Alkaline Phosphatase 82 39 - 117 IU/L   AST 24 0 - 40 IU/L   ALT 16 0 - 32 IU/L  Lipid Panel w/o Chol/HDL Ratio  Result Value Ref Range   Cholesterol, Total 219 (H) 100 - 199 mg/dL   Triglycerides 831 (H) 0 - 149 mg/dL   HDL 43 >51 mg/dL   VLDL Cholesterol Cal 29 5 - 40 mg/dL   LDL Chol Calc (NIH) 761 (H) 0 - 99 mg/dL  Microalbumin, Urine Waived  Result Value Ref Range   Microalb, Ur Waived 30 (H) 0 - 19 mg/L   Creatinine, Urine Waived 50 10 - 300 mg/dL   Microalb/Creat Ratio 30-300 (H) <30 mg/g  TSH  Result Value Ref Range   TSH 2.850 0.450 - 4.500 uIU/mL  Urinalysis, Routine w reflex microscopic  Result Value Ref Range   Specific Gravity, UA 1.015 1.005 - 1.030   pH, UA 6.5 5.0 - 7.5   Color, UA Yellow Yellow   Appearance Ur Hazy (A) Clear   Leukocytes,UA 2+ (A) Negative   Protein,UA Negative Negative/Trace   Glucose, UA Negative Negative   Ketones, UA Negative Negative   RBC, UA 2+ (A) Negative   Bilirubin, UA Negative Negative   Urobilinogen, Ur 0.2 0.2 - 1.0 mg/dL  Nitrite, UA Negative Negative   Microscopic Examination See below:       Assessment & Plan:   Problem List Items Addressed This Visit    None    Visit Diagnoses    Suspected COVID-19 virus infection    -  Primary   Will get her swabbed- out of quarantine window. Call with any concerns.    Acute bronchitis, unspecified organism       Will treat with prednisone and doxycycline. Tussinex and tessalon perles for comfort. Call with any concerns.        Follow up plan: Return 1-2 weeks.    . This visit was completed via telephone due to the restrictions of the COVID-19 pandemic. All issues as above were discussed and addressed but no physical exam was performed. If it was felt that the patient should be evaluated in the office, they were directed there. The patient verbally consented to this visit. Patient was unable to complete an audio/visual visit due to Lack of equipment. Due to the catastrophic nature of the COVID-19 pandemic, this visit was done through audio contact only. . Location of the patient: home . Location of the provider: work . Those involved with this call:  . Provider: Olevia Perches, DO . CMA: Rolley Sims, CMA . Front Desk/Registration: Harriet Pho  . Time spent on call: 21 minutes with patient face to face via video conference. More than 50% of this time was spent in counseling and coordination of care. 30 minutes total spent in review of patient's record and preparation of their chart.

## 2020-07-15 LAB — NOVEL CORONAVIRUS, NAA: SARS-CoV-2, NAA: NOT DETECTED

## 2020-07-15 LAB — SARS-COV-2, NAA 2 DAY TAT

## 2020-07-22 ENCOUNTER — Ambulatory Visit: Payer: Medicare Other | Admitting: Cardiology

## 2020-07-23 ENCOUNTER — Ambulatory Visit: Payer: Medicare Other | Admitting: Family Medicine

## 2020-08-02 ENCOUNTER — Ambulatory Visit: Payer: Medicare Other | Admitting: Cardiology

## 2020-08-10 ENCOUNTER — Ambulatory Visit: Payer: Self-pay | Admitting: General Practice

## 2020-08-10 ENCOUNTER — Telehealth: Payer: Medicaid Other | Admitting: General Practice

## 2020-08-10 DIAGNOSIS — E782 Mixed hyperlipidemia: Secondary | ICD-10-CM

## 2020-08-10 DIAGNOSIS — I10 Essential (primary) hypertension: Secondary | ICD-10-CM

## 2020-08-10 DIAGNOSIS — F419 Anxiety disorder, unspecified: Secondary | ICD-10-CM

## 2020-08-10 DIAGNOSIS — F339 Major depressive disorder, recurrent, unspecified: Secondary | ICD-10-CM

## 2020-08-10 DIAGNOSIS — N183 Chronic kidney disease, stage 3 unspecified: Secondary | ICD-10-CM

## 2020-08-10 NOTE — Patient Instructions (Addendum)
Visit Information  Goals Addressed            This Visit's Progress   . RNCM: Chronic Disease Managment and suppor       Current Barriers:  . Chronic Disease Management support, education, and care coordination needs related to HTN, HLD, CKD Stage 3, and Depression  Clinical Goal(s) related to HTN, HLD, CKD Stage 3, and Depression:  Over the next 120 days, patient will:  . Work with the care management team to address educational, disease management, and care coordination needs  . Begin or continue self health monitoring activities as directed today Measure and record blood pressure 2 times per week . Call provider office for new or worsened signs and symptoms Blood pressure findings outside established parameters, New or worsened symptom related to HTN, HLD, Chronic Kidney Disease 3, and depression . Call care management team with questions or concerns . Verbalize basic understanding of patient centered plan of care established today  Interventions related to HTN, HLD, CKD Stage 3, and Depression:  . Evaluation of current treatment plans and patient's adherence to plan as established by provider.  06-29-2020: [name] states that the patient is stable right now and seems to be doing well. Was only not able to pay her phone bill for this month but she will be able to pay for it next week. She had been doing better with her money but she goes to [employer] a lot. The granddaughter states she saw family recently and that always helps her mood. She also successfully completed her appointment with Dr. Laural Benes on 06-24-2020.  08-10-2020: The patient is stable at this time. Spent Thanksgiving with her family and she did well.  . Assessed patient understanding of disease states.  The granddaughter states she understands her chronic conditions but wants to talk to pcp about the patients mood changes and not acting age appropriate. The granddaughter is open to ideas and recommendations from the care  team. 08-10-2020: The patient was tested for COVID in November but it was negative. [name] states that she still has some symptoms of being sick but denies any acute distress.  . Assessed patient's education and care coordination needs. 06-07-2020: Will touch base with the LCSW to follow up with the patient granddaughter for recommendations and help with anxiety and depression. 08-10-2020: The patients granddaughter states that there are no needs at this time.  . Evaluation of HTN/HLD/CKD/Depression/Anxiety: The patients granddaughter does not have any vital sign readings for the [facility]. Attempts to reach the patient were unsuccessful. Empathetic listening and support given. The granddaughter feels the patient is  not safe but does wish she would consider talking to a psychiatrist.  The patient has refused this in the past. 08-10-2020: The granddaughter states that she is doing well right now.  . Provided disease specific education to patient. Education on help and resources to help with her depression and anxiety. The patient seems to be doing better since getting a smaller dog. The patient seems to be taking her medication at this time.  [name] with appropriate clinical care team members regarding patient needs.  Pharmacy and LCSW ongoing support and help with managing health and well being of the patient.   Patient Self Care Activities related to HTN, HLD, CKD Stage 3, and Depression:  . Patient is unable to independently self-manage chronic health conditions  Please see past updates related to this goal by clicking on the "Past Updates" button in the selected goal      .  RNCM: Tell Dr. Laural Benes I stopped taking that other medicine. It makes me jittery       Current Barriers:  Marland Kitchen Knowledge Deficits related to understanding the significance of taking medications as prescribed for chronic medical conditions because the patient "feels good" . Lacks caregiver support. Daughter [name] Deceased x 2  years, Daughter [name] not involved, has a granddaughter [name] that helps when she can . Literacy barriers . Transportation barriers . Non-adherence to scheduled provider appointments . Non-adherence to prescribed medication regimen . Cognitive Deficits . Chronic Disease Management support and education needs related to HTN, HLD, Chronic Kidney Disease stage 3 and Depression  Nurse Case Manager Clinical Goal(s):  Marland Kitchen Over the next 120 days, patient will work with pcp and CCM team  to address needs related to management of chronic disease processes and taking prescribed medications as directed  . Over the next 120 days, patient will demonstrate a decrease in depression  exacerbations as evidenced by of the patient taking medications as directed . Over the next 120 days, patient will demonstrate improved health management independence as evidenced by following recommendations by the care team to help the patient with her health and well being . Over the next 90 days, patient will work with CM team pharmacist to manage medications and compliance concerns . Over the next 90 days, patient will work with CM clinical social worker to gain effective coping skills for her depression and the loss of her daughter 2 years ago . Over the next 90 days, patient will work with care guide team to assist with low income housing closer to her granddaughter in [place] . Over the next 90 days, the patient will demonstrate ongoing self health care management ability as evidenced by following the recommendations of the pcp and CCM team  Interventions:  . Evaluation of current treatment plan related to HTN, HLD, Chronic Kidney disease, and depression and patient's adherence to plan as established by provider. . Advised patient's granddaughter of appointment scheduled on 09-24-2020. Marland Kitchen Reviewed medications with patient and discussed noncompliance of medication regimen and the need to take medications as directed.  06-07-2020: The patients granddaughter states that the patient tells her she is taking her medication as directed but she feels the patient is lying to her about it. [name] is overwhelmed with the amount of times the patient is calling her and texting her daily. She states that she does not know what to do. She said the APS case worker called her and was rude to her and told her they could not do anything about the patient having poor judgment.  [name] is seeking help in how to best manage the care of the patient. 06-29-2020: Currently the patient is stable at this time. The patients granddaughter states that she has been in a good mood lately.  Her nephew just had his first birthday party and she was with family. The patient has not been as interactive with [name] as they had an argument but she did ask [name] to take her next week to her cardiology appointment.  Marland Kitchen Collaborated with pcp and CCM team  regarding noncompliance of treatment regimen for chronic disease management.  08-10-2020: This is an ongoing issue with the patient per the patients granddaughter.  . Discussed plans with patient for ongoing care management follow up and provided patient with direct contact information for care management team . Care Guide referral for assistance in finding low income housing in [place] so the patient can be closer to  her granddaughter.  02-24-2020: the granddaughter verbalized she has checked into some places closer to her but the price is out of range. She is not sure she would move or not. The patient purchased a Zenaida Niece and has not told the granddaughter. The granddaughter says she has had a Zenaida Niece about 3 months. [name]'s brother Clide Cliff finally told her that the patient was allowing him to use it at this time until he could get his own transportation. [name] states that the patient has not been talking to her much and has been avoiding her for about 3 weeks. 06-07-2020: The patients granddaughter states that she  does not know how much longer she can help her grandmother in current state. She does not feel she is safe to drive and she is not taking her medications. She gets very easily upset. Social Worker is working with the patient and the patients granddaughter for depression and multiple issues related to grief and loss.  08-10-2020: Currently the patient is stable. No new concerns at this time. Will continue to monitor.  Marland Kitchen Pharmacy referral for noncompliance to prescribed medication regimen- the patient confirmed she is not taking any medications because she "feels good" and she stopped taking that "other medicine" because it made her feel "jittery" (medication fluoxetine for her depression)  Patient Self Care Activities:  . Patient verbalizes understanding of plan to see the pcp in the next 30 days to evaluate health and wellbeing . Self administers medications as prescribed . Attends all scheduled provider appointments . Calls provider office for new concerns or questions . Unable to independently manage chronic disease processes  . Unable to self administer medications as prescribed . Does not adhere to provider recommendations re: care plan and taking medications as prescribed  . Lacks social connections  Please see past updates related to this goal by clicking on the "Past Updates" button in the selected goal         The patient verbalized understanding of instructions, educational materials, and care plan provided today and declined offer to receive copy of patient instructions, educational materials, and care plan.   Telephone follow up appointment with care management team member scheduled for: 10-05-2020 at 11:45 am  Alto Denver RN, MSN, CCM Community Care Coordinator [place]  [facility] Mobile: [phone]

## 2020-08-10 NOTE — Chronic Care Management (AMB) (Signed)
Chronic Care Management   Follow Up Note   08/10/2020 Name: Renee Bridges MRN: [id number] DOB: 06/22/1946  Referred by: Dorcas CarrowJohnson, Megan P, DO Reason for referral : Chronic Care Management (RNCM Follow up for Chronic Disease Management and Care Coordination Needs)   Renee Bridges is a 74 y.o. year old female who is a primary care patient of Dorcas CarrowJohnson, Megan P, DO. The CCM team was consulted for assistance with chronic disease management and care coordination needs.    Review of patient status, including review of consultants reports, relevant laboratory and other test results, and collaboration with appropriate care team members and the patient's provider was performed as part of comprehensive patient evaluation and provision of chronic care management services.    SDOH (Social Determinants of Health) assessments performed: Yes See Care Plan activities for detailed interventions related to [facility])     Outpatient Encounter Medications as of 08/10/2020  Medication Sig   atorvastatin (LIPITOR) 20 MG tablet Take 1 tablet (20 mg total) by mouth daily.   benzonatate (TESSALON) 200 MG capsule Take 1 capsule (200 mg total) by mouth 2 (two) times daily as needed for cough.   chlorpheniramine-HYDROcodone (TUSSIONEX PENNKINETIC ER) 10-8 MG/5ML SUER Take 5 mLs by mouth at bedtime as needed.   doxycycline (VIBRA-TABS) 100 MG tablet Take 1 tablet (100 mg total) by mouth 2 (two) times daily.   predniSONE (DELTASONE) 10 MG tablet 6 tabs today, 5 tabs tomorrow, decrease by 1 every day until gone   QUEtiapine (SEROQUEL XR) 50 MG TB24 24 hr tablet Take 1 tablet (50 mg total) by mouth at bedtime.   No facility-administered encounter medications on file as of 08/10/2020.     Objective:   Goals Addressed            This Visit's Progress    RNCM: Chronic Disease Managment and suppor       Current Barriers:   Chronic Disease Management support, education, and care coordination needs related to HTN,  HLD, CKD Stage 3, and Depression  Clinical Goal(s) related to HTN, HLD, CKD Stage 3, and Depression:  Over the next 120 days, patient will:   Work with the care management team to address educational, disease management, and care coordination needs   Begin or continue self health monitoring activities as directed today Measure and record blood pressure 2 times per week  Call provider office for new or worsened signs and symptoms Blood pressure findings outside established parameters, New or worsened symptom related to HTN, HLD, Chronic Kidney Disease 3, and depression  Call care management team with questions or concerns  Verbalize basic understanding of patient centered plan of care established today  Interventions related to HTN, HLD, CKD Stage 3, and Depression:   Evaluation of current treatment plans and patient's adherence to plan as established by provider.  06-29-2020: Shanda BumpsJessica states that the patient is stable right now and seems to be doing well. Was only not able to pay her phone bill for this month but she will be able to pay for it next week. She had been doing better with her money but she goes to [employer] a lot. The granddaughter states she saw family recently and that always helps her mood. She also successfully completed her appointment with Dr. Laural BenesJohnson on 06-24-2020.  08-10-2020: The patient is stable at this time. Spent Thanksgiving with her family and she did well.   Assessed patient understanding of disease states.  The granddaughter states she understands her chronic  conditions but wants to talk to pcp about the patients mood changes and not acting age appropriate. The granddaughter is open to ideas and recommendations from the care team. 08-10-2020: The patient was tested for COVID in November but it was negative. [name] states that she still has some symptoms of being sick but denies any acute distress.   Assessed patient's education and care coordination needs.  06-07-2020: Will touch base with the LCSW to follow up with the patient granddaughter for recommendations and help with anxiety and depression. 08-10-2020: The patients granddaughter states that there are no needs at this time.   Evaluation of HTN/HLD/CKD/Depression/Anxiety: The patients granddaughter does not have any vital sign readings for the [facility]. Attempts to reach the patient were unsuccessful. Empathetic listening and support given. The granddaughter feels the patient is  not safe but does wish she would consider talking to a psychiatrist.  The patient has refused this in the past. 08-10-2020: The granddaughter states that she is doing well right now.   Provided disease specific education to patient. Education on help and resources to help with her depression and anxiety. The patient seems to be doing better since getting a smaller dog. The patient seems to be taking her medication at this time.   Collaborated with appropriate clinical care team members regarding patient needs.  Pharmacy and LCSW ongoing support and help with managing health and well being of the patient.   Patient Self Care Activities related to HTN, HLD, CKD Stage 3, and Depression:   Patient is unable to independently self-manage chronic health conditions  Please see past updates related to this goal by clicking on the "Past Updates" button in the selected goal       RNCM: Tell Dr. Laural Benes I stopped taking that other medicine. It makes me jittery       Current Barriers:   Knowledge Deficits related to understanding the significance of taking medications as prescribed for chronic medical conditions because the patient "feels good"  Lacks caregiver support. Daughter [name] Deceased x 2 years, Daughter [name] not involved, has a granddaughter [name] that helps when she can  Literacy barriers  Transportation barriers  Non-adherence to scheduled provider appointments  Non-adherence to prescribed medication  regimen  Cognitive Deficits  Chronic Disease Management support and education needs related to HTN, HLD, Chronic Kidney Disease stage 3 and Depression  Nurse Case Manager Clinical Goal(s):   Over the next 120 days, patient will work with pcp and CCM team  to address needs related to management of chronic disease processes and taking prescribed medications as directed   Over the next 120 days, patient will demonstrate a decrease in depression  exacerbations as evidenced by of the patient taking medications as directed  Over the next 120 days, patient will demonstrate improved health management independence as evidenced by following recommendations by the care team to help the patient with her health and well being  Over the next 90 days, patient will work with CM team pharmacist to manage medications and compliance concerns  Over the next 90 days, patient will work with CM clinical social worker to gain effective coping skills for her depression and the loss of her daughter 2 years ago  Over the next 90 days, patient will work with care guide team to assist with low income housing closer to her granddaughter in [place]  Over the next 90 days, the patient will demonstrate ongoing self health care management ability as evidenced by following the recommendations of the  pcp and CCM team  Interventions:   Evaluation of current treatment plan related to HTN, HLD, Chronic Kidney disease, and depression and patient's adherence to plan as established by provider.  Advised patient's granddaughter of appointment scheduled on 09-24-2020.  Reviewed medications with patient and discussed noncompliance of medication regimen and the need to take medications as directed. 06-07-2020: The patients granddaughter states that the patient tells her she is taking her medication as directed but she feels the patient is lying to her about it. [name] is overwhelmed with the amount of times the patient is calling  her and texting her daily. She states that she does not know what to do. She said the APS case worker called her and was rude to her and told her they could not do anything about the patient having poor judgment.  [name] is seeking help in how to best manage the care of the patient. 06-29-2020: Currently the patient is stable at this time. The patients granddaughter states that she has been in a good mood lately.  Her nephew just had his first birthday party and she was with family. The patient has not been as interactive with [name] as they had an argument but she did ask [name] to take her next week to her cardiology appointment.   Collaborated with pcp and CCM team  regarding noncompliance of treatment regimen for chronic disease management.  08-10-2020: This is an ongoing issue with the patient per the patients granddaughter.   Discussed plans with patient for ongoing care management follow up and provided patient with direct contact information for care management team  Care Guide referral for assistance in finding low income housing in [place] so the patient can be closer to her granddaughter.  02-24-2020: the granddaughter verbalized she has checked into some places closer to her but the price is out of range. She is not sure she would move or not. The patient purchased a [employer]ena[employer] Niece and has not told the granddaughter. The granddaughter says she has had a [employer] Niece about 3 months. [name]'s brother Clide Cliff finally told her that the patient was allowing him to use it at this time until he could get his own transportation. [name] states that the patient has not been talking to her much and has been avoiding her for about 3 weeks. 06-07-2020: The patients granddaughter states that she does not know how much longer she can help her grandmother in current state. She does not feel she is safe to drive and she is not taking her medications. She gets very easily upset. Social Worker is working with the patient and the  patients granddaughter for depression and multiple issues related to grief and loss.  08-10-2020: Currently the patient is stable. No new concerns at this time. Will continue to monitor.   Pharmacy referral for noncompliance to prescribed medication regimen- the patient confirmed she is not taking any medications because she "feels good" and she stopped taking that "other medicine" because it made her feel "jittery" (medication fluoxetine for her depression)  Patient Self Care Activities:   Patient verbalizes understanding of plan to see the pcp in the next 30 days to evaluate health and wellbeing  Self administers medications as prescribed  Attends all scheduled provider appointments  Calls provider office for new concerns or questions  Unable to independently manage chronic disease processes   Unable to self administer medications as prescribed  Does not adhere to provider recommendations re: care plan and taking medications as prescribed   Lacks  social connections  Please see past updates related to this goal by clicking on the "Past Updates" button in the selected goal         There are no care plans to display for this patient.   Plan:   Telephone follow up appointment with care management team member scheduled for: 10-05-2020 at 11:45 am   Alto Denver RN, MSN, CCM Community Care Coordinator [place]   [facility] Mobile: [phone]

## 2020-08-16 ENCOUNTER — Ambulatory Visit: Payer: Medicare Other | Admitting: Cardiology

## 2020-08-26 ENCOUNTER — Ambulatory Visit: Payer: Medicare Other | Admitting: Cardiology

## 2020-09-10 ENCOUNTER — Telehealth: Payer: Medicaid Other

## 2020-09-15 ENCOUNTER — Telehealth: Payer: Medicaid Other

## 2020-09-24 ENCOUNTER — Ambulatory Visit: Payer: Medicare Other | Admitting: Family Medicine

## 2020-09-30 ENCOUNTER — Telehealth: Payer: Self-pay | Admitting: Family Medicine

## 2020-09-30 ENCOUNTER — Telehealth: Payer: Self-pay

## 2020-09-30 NOTE — Telephone Encounter (Signed)
Copied from CRM [id number]. Topic: General - Other >> Sep 30, 2020  1:09 PM Wyonia Hough E wrote: Reason for CRM: Pt needs to speak with Dr. Laural Benes / Pt stated it is extremely important that she speak with her today/ Pt stated she will lose her di=rivers license on Feb 4th/ please advise  Pt states she does not want to lose her drivers license . Would Pt need an apt for this? S.Jayme Cloud

## 2020-09-30 NOTE — Telephone Encounter (Signed)
FYI

## 2020-09-30 NOTE — Telephone Encounter (Signed)
FYI Pt states she received a letter stating that she needs an evaluation by psychologist  done to make sure she can drive and states her  insurance will not pay and will cost her $700 to go to a psychologist evaluation  .Pt sates she doesn't drive in the bad weather but did drive to the grocery store only and states she only needs her license to drive to grocery store. Pt has apt on 10/04/2020

## 2020-10-01 ENCOUNTER — Ambulatory Visit: Payer: Medicare Other | Admitting: Cardiology

## 2020-10-01 NOTE — Telephone Encounter (Signed)
Unfortunately if this is coming from the state, there is nothing I can do about this.

## 2020-10-04 ENCOUNTER — Ambulatory Visit (INDEPENDENT_AMBULATORY_CARE_PROVIDER_SITE_OTHER): Payer: Medicare Other | Admitting: Family Medicine

## 2020-10-04 ENCOUNTER — Encounter: Payer: Self-pay | Admitting: Family Medicine

## 2020-10-04 ENCOUNTER — Other Ambulatory Visit: Payer: Self-pay

## 2020-10-04 VITALS — BP 136/72 | HR 65 | Temp 97.7°F | Wt 162.0 lb

## 2020-10-04 DIAGNOSIS — E782 Mixed hyperlipidemia: Secondary | ICD-10-CM

## 2020-10-04 DIAGNOSIS — I129 Hypertensive chronic kidney disease with stage 1 through stage 4 chronic kidney disease, or unspecified chronic kidney disease: Secondary | ICD-10-CM

## 2020-10-04 DIAGNOSIS — Z9189 Other specified personal risk factors, not elsewhere classified: Secondary | ICD-10-CM | POA: Diagnosis not present

## 2020-10-04 DIAGNOSIS — F419 Anxiety disorder, unspecified: Secondary | ICD-10-CM

## 2020-10-04 DIAGNOSIS — F339 Major depressive disorder, recurrent, unspecified: Secondary | ICD-10-CM | POA: Diagnosis not present

## 2020-10-04 DIAGNOSIS — I10 Essential (primary) hypertension: Secondary | ICD-10-CM | POA: Diagnosis not present

## 2020-10-04 LAB — URINALYSIS, ROUTINE W REFLEX MICROSCOPIC
Bilirubin, UA: NEGATIVE
Glucose, UA: NEGATIVE
Ketones, UA: NEGATIVE
Nitrite, UA: NEGATIVE
Protein,UA: NEGATIVE
Specific Gravity, UA: 1.01 (ref 1.005–1.030)
Urobilinogen, Ur: 0.2 mg/dL (ref 0.2–1.0)
pH, UA: 7 (ref 5.0–7.5)

## 2020-10-04 LAB — MICROALBUMIN, URINE WAIVED
Creatinine, Urine Waived: 50 mg/dL (ref 10–300)
Microalb, Ur Waived: 10 mg/L (ref 0–19)
Microalb/Creat Ratio: <30 mg/g (ref <30)

## 2020-10-04 LAB — MICROSCOPIC EXAMINATION: Bacteria, UA: NONE SEEN

## 2020-10-04 MED ORDER — ATORVASTATIN CALCIUM 20 MG PO TABS: 20.0000 mg | ORAL_TABLET | Freq: Every day | ORAL | 1 refills | Status: DC

## 2020-10-04 MED ORDER — QUETIAPINE FUMARATE ER 50 MG PO TB24: 50.0000 mg | ORAL_TABLET | Freq: Every day | ORAL | 1 refills | Status: DC

## 2020-10-04 NOTE — Assessment & Plan Note (Signed)
Better on recheck. Continue to monitor. Call with any concerns.  

## 2020-10-04 NOTE — Assessment & Plan Note (Signed)
Has not been taking her statin. Restart and checking labs. Await results.

## 2020-10-04 NOTE — Progress Notes (Signed)
BP 136/72 (BP Location: Left Arm, Patient Position: Sitting, Cuff Size: Normal)   Pulse 65   Temp 97.7 F (36.5 C)   Wt 162 lb (73.5 kg) Comment: Patient reported  LMP  (LMP Unknown)   SpO2 93%   BMI 30.61 kg/m    Subjective:    Patient ID: Renee Bridges, female    DOB: 1946/08/26, 75 y.o.   MRN: [id number]  HPI: Renee Bridges is a 75 y.o. female  Chief Complaint  Patient presents with  . Depression  . Hyperlipidemia  . Hypertension   ANXIETY/DEPRESSION Duration: chronic Status:stable Anxious mood: yes  Excessive worrying: no Irritability: no  Sweating: no Nausea: no Palpitations:no Hyperventilation: no Panic attacks: no Agoraphobia: no  Obscessions/compulsions: no Depressed mood: yes Depression screen [facility] 2/9 06/24/2020 06/24/2020 02/27/2020 12/15/2019 11/21/2019  Decreased Interest 0 0 0 0 0  Down, Depressed, Hopeless 0 0 1 0 0  PHQ - 2 Score 0 0 1 0 0  Altered sleeping 0 - 3 2 3   Tired, decreased energy 0 - 3 0 0  Change in appetite 0 - 1 0 2  Feeling bad or failure about yourself  0 - 0 0 0  Trouble concentrating 0 - 0 0 0  Moving slowly or fidgety/restless 0 - 3 2 2   Suicidal thoughts 0 - 0 0 0  PHQ-9 Score 0 - 11 4 7   Difficult doing work/chores Not difficult at all - Very difficult Very difficult Somewhat difficult  Some recent data might be hidden   Anhedonia: no Weight changes: no Insomnia: no   Hypersomnia: no Fatigue/loss of energy: yes Feelings of worthlessness: no Feelings of guilt: yes Impaired concentration/indecisiveness: no Suicidal ideations: no  Crying spells: no Recent Stressors/Life Changes: no   Relationship problems: no   Family stress: no     Financial stress: no    Job stress: no    Recent death/loss: no  HYPERLIPIDEMIA Hyperlipidemia status: has not been taking her medicine Satisfied with current treatment?  no Side effects:  no Medication compliance: poor compliance Past cholesterol meds: atorvastatin Supplements:  none Aspirin:  no The 10-year ASCVD risk score DC Jr., et al., 2013) is: 19.6%   Values used to calculate the score:     Age: 62 years     Sex: Female     Is Non-Hispanic African American: No     Diabetic: No     Tobacco smoker: No     Systolic Blood Pressure: 136 mmHg     Is BP treated: No     HDL Cholesterol: 43 mg/dL     Total Cholesterol: 219 mg/dL Chest pain:  no Coronary artery disease:  no  Is very concerned about having to do a driving evaluation to maintain her license. It is $700 and she is concerned about the cost of this.   Relevant past medical, surgical, family and social history reviewed and updated as indicated. Interim medical history since our last visit reviewed. Allergies and medications reviewed and updated.  Review of Systems  Constitutional: Negative.   Respiratory: Negative.   Cardiovascular: Negative.   Gastrointestinal: Negative.   Musculoskeletal: Negative.   Neurological: Negative.   Psychiatric/Behavioral: Negative.     Per HPI unless specifically indicated above     Objective:    BP 136/72 (BP Location: Left Arm, Patient Position: Sitting, Cuff Size: Normal)   Pulse 65   Temp 97.7 F (36.5 C)   Wt 162 lb (73.5 kg) Comment: Patient  reported  LMP  (LMP Unknown)   SpO2 93%   BMI 30.61 kg/m   Wt Readings from Last 3 Encounters:  10/04/20 162 lb (73.5 kg)  07/13/20 163 lb (73.9 kg)  06/24/20 168 lb 3.2 oz (76.3 kg)    Physical Exam Vitals and nursing note reviewed.  Constitutional:      General: She is not in acute distress.    Appearance: Normal appearance. She is not ill-appearing, toxic-appearing or diaphoretic.  HENT:     Head: Normocephalic and atraumatic.     Right Ear: External ear normal.     Left Ear: External ear normal.     Nose: Nose normal.     Mouth/Throat:     Mouth: Mucous membranes are moist.     Pharynx: Oropharynx is clear.  Eyes:     General: No scleral icterus.       Right eye: No discharge.         Left eye: No discharge.     Extraocular Movements: Extraocular movements intact.     Conjunctiva/sclera: Conjunctivae normal.     Pupils: Pupils are equal, round, and reactive to light.  Cardiovascular:     Rate and Rhythm: Normal rate and regular rhythm.     Pulses: Normal pulses.     Heart sounds: Normal heart sounds. No murmur heard. No friction rub. No gallop.   Pulmonary:     Effort: Pulmonary effort is normal. No respiratory distress.     Breath sounds: Normal breath sounds. No stridor. No wheezing, rhonchi or rales.  Chest:     Chest wall: No tenderness.  Musculoskeletal:        General: Normal range of motion.     Cervical back: Normal range of motion and neck supple.  Skin:    General: Skin is warm and dry.     Capillary Refill: Capillary refill takes less than 2 seconds.     Coloration: Skin is not jaundiced or pale.     Findings: No bruising, erythema, lesion or rash.  Neurological:     General: No focal deficit present.     Mental Status: She is alert and oriented to person, place, and time. Mental status is at baseline.  Psychiatric:        Mood and Affect: Mood normal.        Behavior: Behavior normal.        Thought Content: Thought content normal.        Judgment: Judgment normal.     Results for orders placed or performed in visit on 07/13/20  Novel Coronavirus, NAA ([facility])   Specimen: Saline  Result Value Ref Range   SARS-CoV-2, NAA Not Detected Not Detected  SARS-COV-2, NAA 2 DAY TAT  Result Value Ref Range   SARS-CoV-2, NAA 2 DAY TAT Performed       Assessment & Plan:   Problem List Items Addressed This Visit      Genitourinary   Benign hypertensive renal disease    Better on recheck. Continue to monitor. Call with any concerns.      Relevant Orders   Urinalysis, Routine w reflex microscopic   Microalbumin, Urine Waived     Other   Anxiety - Primary    Under good control on current regimen. Continue current regimen. Continue to monitor.  Call with any concerns. Refills given. Labs drawn today.      Relevant Orders   Comprehensive metabolic panel   Depression, recurrent (HCC)    Under  good control on current regimen. Continue current regimen. Continue to monitor. Call with any concerns. Refills given. Labs drawn today.       Relevant Orders   Comprehensive metabolic panel   CBC with Differential/Platelet   Hyperlipidemia    Has not been taking her statin. Restart and checking labs. Await results.       Relevant Medications   atorvastatin (LIPITOR) 20 MG tablet   Other Relevant Orders   Lipid Panel w/o Chol/HDL Ratio   Comprehensive metabolic panel   Driving safety issue    Discussed with patient that I cannot say she is safe to drive without her having a driving evaluation. She is quite concerned as it is quite expensive. I discussed that even though it is expensive,  Given there was a concerned raised, I cannot say she is safe without one. I will send referral to CCM to see if they have any way to help get this paid for. Call with any concerns.       Relevant Orders   AMB Referral to [facility] Coordinaton       Follow up plan: Return in about 3 months (around 01/01/2021).

## 2020-10-04 NOTE — Assessment & Plan Note (Signed)
Discussed with patient that I cannot say she is safe to drive without her having a driving evaluation. She is quite concerned as it is quite expensive. I discussed that even though it is expensive,  Given there was a concerned raised, I cannot say she is safe without one. I will send referral to CCM to see if they have any way to help get this paid for. Call with any concerns.

## 2020-10-04 NOTE — Assessment & Plan Note (Signed)
Under good control on current regimen. Continue current regimen. Continue to monitor. Call with any concerns. Refills given. Labs drawn today.   

## 2020-10-05 ENCOUNTER — Ambulatory Visit (INDEPENDENT_AMBULATORY_CARE_PROVIDER_SITE_OTHER): Payer: Medicare Other | Admitting: General Practice

## 2020-10-05 ENCOUNTER — Telehealth: Payer: Self-pay | Admitting: General Practice

## 2020-10-05 ENCOUNTER — Telehealth: Payer: Medicaid Other | Admitting: General Practice

## 2020-10-05 DIAGNOSIS — Z9189 Other specified personal risk factors, not elsewhere classified: Secondary | ICD-10-CM

## 2020-10-05 DIAGNOSIS — F419 Anxiety disorder, unspecified: Secondary | ICD-10-CM

## 2020-10-05 DIAGNOSIS — R4182 Altered mental status, unspecified: Secondary | ICD-10-CM

## 2020-10-05 DIAGNOSIS — E782 Mixed hyperlipidemia: Secondary | ICD-10-CM

## 2020-10-05 DIAGNOSIS — N183 Chronic kidney disease, stage 3 unspecified: Secondary | ICD-10-CM

## 2020-10-05 DIAGNOSIS — F339 Major depressive disorder, recurrent, unspecified: Secondary | ICD-10-CM

## 2020-10-05 LAB — COMPREHENSIVE METABOLIC PANEL
ALT: 17 IU/L (ref 0–32)
AST: 25 IU/L (ref 0–40)
Albumin/Globulin Ratio: 1.8 (ref 1.2–2.2)
Albumin: 4.3 g/dL (ref 3.7–4.7)
Alkaline Phosphatase: 80 IU/L (ref 44–121)
BUN/Creatinine Ratio: 8 — ABNORMAL LOW (ref 12–28)
BUN: 9 mg/dL (ref 8–27)
Bilirubin Total: 0.5 mg/dL (ref 0.0–1.2)
CO2: 24 mmol/L (ref 20–29)
Calcium: 9.5 mg/dL (ref 8.7–10.3)
Chloride: 101 mmol/L (ref 96–106)
Creatinine, Ser: 1.09 mg/dL — ABNORMAL HIGH (ref 0.57–1.00)
GFR calc Af Amer: 57 mL/min/{1.73_m2} — ABNORMAL LOW (ref >59)
GFR calc non Af Amer: 49 mL/min/{1.73_m2} — ABNORMAL LOW (ref >59)
Globulin, Total: 2.4 g/dL (ref 1.5–4.5)
Glucose: 96 mg/dL (ref 65–99)
Potassium: 4.4 mmol/L (ref 3.5–5.2)
Sodium: 138 mmol/L (ref 134–144)
Total Protein: 6.7 g/dL (ref 6.0–8.5)

## 2020-10-05 LAB — LIPID PANEL W/O CHOL/HDL RATIO
Cholesterol, Total: 208 mg/dL — ABNORMAL HIGH (ref 100–199)
HDL: 41 mg/dL (ref >39)
LDL Chol Calc (NIH): 141 mg/dL — ABNORMAL HIGH (ref 0–99)
Triglycerides: 146 mg/dL (ref 0–149)
VLDL Cholesterol Cal: 26 mg/dL (ref 5–40)

## 2020-10-05 LAB — CBC WITH DIFFERENTIAL/PLATELET
Basophils Absolute: 0 10*3/uL (ref 0.0–0.2)
Basos: 1 %
EOS (ABSOLUTE): 0.1 10*3/uL (ref 0.0–0.4)
Eos: 2 %
Hematocrit: 46.7 % — ABNORMAL HIGH (ref 34.0–46.6)
Hemoglobin: 15 g/dL (ref 11.1–15.9)
Immature Grans (Abs): 0 10*3/uL (ref 0.0–0.1)
Immature Granulocytes: 0 %
Lymphocytes Absolute: 2.4 10*3/uL (ref 0.7–3.1)
Lymphs: 36 %
MCH: 30.5 pg (ref 26.6–33.0)
MCHC: 32.1 g/dL (ref 31.5–35.7)
MCV: 95 fL (ref 79–97)
Monocytes Absolute: 0.6 10*3/uL (ref 0.1–0.9)
Monocytes: 8 %
Neutrophils Absolute: 3.6 10*3/uL (ref 1.4–7.0)
Neutrophils: 53 %
Platelets: 219 10*3/uL (ref 150–450)
RBC: 4.92 x10E6/uL (ref 3.77–5.28)
RDW: 11.8 % (ref 11.7–15.4)
WBC: 6.8 10*3/uL (ref 3.4–10.8)

## 2020-10-05 NOTE — Chronic Care Management (AMB) (Signed)
Chronic Care Management   CCM RN Visit Note  10/05/2020 Name: Renee Bridges MRN: [id number] DOB: 1946-06-05  Subjective: Renee Bridges is a 75 y.o. year old female who is a primary care patient of [name], DO. The care management team was consulted for assistance with disease management and care coordination needs.    Engaged with patient by telephone (spoke to patients granddaughter- Renee Bridges) for follow up visit in response to provider referral for case management and/or care coordination services.   Consent to Services:  The patient was given information about Chronic Care Management services, agreed to services, and gave verbal consent prior to initiation of services.  Please see initial visit note for detailed documentation.   Patient agreed to services and verbal consent obtained.   Assessment: Review of patient past medical history, allergies, medications, health status, including review of consultants reports, laboratory and other test data, was performed as part of comprehensive evaluation and provision of chronic care management services.   SDOH (Social Determinants of Health) assessments and interventions performed:    CCM Care Plan  Allergies  Allergen Reactions   Bactrim [Sulfamethoxazole-Trimethoprim] Other (See Comments)    Sweating, feeling like throat is closing up   Pollen Extract    Codeine Rash    Outpatient Encounter Medications as of 10/05/2020  Medication Sig   atorvastatin (LIPITOR) 20 MG tablet Take 1 tablet (20 mg total) by mouth daily.   QUEtiapine (SEROQUEL XR) 50 MG TB24 24 hr tablet Take 1 tablet (50 mg total) by mouth at bedtime.   No facility-administered encounter medications on file as of 10/05/2020.    Patient Active Problem List   Diagnosis Date Noted   Driving safety issue 88/91/6945   Hyperlipidemia 04/03/2018   Mental status, decreased 12/28/2017   Depression, recurrent (HCC) 10/16/2017   Advanced care  planning/counseling discussion 06/01/2017   Chronic kidney disease, stage 3 (HCC) 03/21/2017   Chronic back pain 09/25/2016   Cyst of right kidney 07/17/2016   Benign hypertensive renal disease 12/27/2015   Failed total left knee replacement (HCC) 05/13/2015   Anxiety 03/25/2015   Urge incontinence 06/30/2013    Conditions to be addressed/monitored:HLD, Anxiety and Depression  Care Plan : RNCM: Depression (Adult)  Updates made by Renee Bridges since 10/05/2020 12:00 AM    Problem: RNCM: Depression Identification (Depression)   Priority: Medium    Goal: RNCM: Depressive Symptoms Identified   Priority: Medium  Note:   Current Barriers:   Knowledge Deficits related to effective management of depression and anxiety   Care Coordination needs related to cost constraints for paying for occupational therapy evaluation  in a patient with the need to have a safe driving evaluation   Chronic Disease Management support and education needs related to chronic conditions of depression and anxiety.   Lacks caregiver support.   Corporate treasurer.   Non-adherence to scheduled provider appointments  Non-adherence to prescribed medication regimen  Unable to independently manage exacerbations in depression and anxiety   Does not attend all scheduled provider appointments  Does not adhere to prescribed medication regimen  Lacks social connections  Does not contact provider office for questions/concerns  Nurse Case Manager Clinical Goal(s):   Over the next 120 days, patient will verbalize understanding of plan for effective management of depression and anxiety   Over the next 120 days, patient will work with [facility] and pcp to address needs related to depression and anxiety   Over the next 120 days, patient will  demonstrate a decrease in anxiety and depression  exacerbations as evidenced by normalized mood and no exacerbations of depression and anxiety   Over the next 120 days,  patient will demonstrate improved health management independence as evidenced bysablized mood, no changes in current state of depression or anxiety   Interventions:   1:1 collaboration with [name], DO regarding development and update of comprehensive plan of care as evidenced by provider attestation and co-signature  Inter-disciplinary care team collaboration (see longitudinal plan of care)  Evaluation of current treatment plan related to depression and anxiety  and patient's adherence to plan as established by provider.  Advised patient to call the office for changes in mood/anxiety/depression  Provided education to patient re: related to safe driving and help with resources to pay for an occupational health evaluation to see if the patient is safe to drive. The patients granddaughter states that the patient now wants to sell her Zenaida Niece and get an ID. She was concerned that she would not have an ID and that is why she wanted to keep her license. Have done a care guide referral to see if there are resources to help with cost of occupational therapy needs.   Reviewed medications with patient and discussed compliance. The granddaughter does not know if she is taking her medications as directed but states she is doing well at this time.   Care Guide referral for resources in the community to help with cost of occupational health  Discussed plans with patient for ongoing care management follow up and provided patient with direct contact information for care management team  Patient Goals/Self-Care Activities Over the next 120 days, patient will:  - Patient will self administer medications as prescribed Patient will attend all scheduled provider appointments Patient will call pharmacy for medication refills Patient will attend church or other social activities Patient will continue to perform IADL's independently Patient will call provider office for new concerns or questions Patient  will work with BSW to address care coordination needs and will continue to work with the clinical team to address health care and disease management related needs.   - anxiety screen reviewed - depression screen reviewed - medication list reviewed  Follow Up Plan: Telephone follow up appointment with care management team member scheduled for: 12-14-2020 at 10:30 am       Task: RNCM: Identify Depressive Symptoms and Facilitate Treatment   Note:   Care Management Activities:    - anxiety screen reviewed - depression screen reviewed - medication list reviewed       Care Plan : RNCM: HLD Management  Updates made by Renee Bridges since 10/05/2020 12:00 AM    Problem: RNCM: HLD Management   Priority: Medium    Goal: RNCM: HLD Management   Priority: Medium  Note:   Current Barriers:   Poorly controlled hyperlipidemia, complicated by depression, anxiety, non-compliance at times  Current antihyperlipidemic regimen: atorvastatin 20 mg qd   Most recent lipid panel:     Component Value Date/Time   CHOL 208 (H) 10/04/2020 1139   TRIG 146 10/04/2020 1139   HDL 41 10/04/2020 1139   LDLCALC 141 (H) 10/04/2020 1139     ASCVD risk enhancing conditions: age >22, HTN, CKD  Unable to independently manage HLD   Unable to self administer medications as prescribed  Does not adhere to prescribed medication regimen  Lacks social connections  Does not contact provider office for questions/concerns  RN Care Manager Clinical Goal(s):   Over  the next 120 days, patient will work with Medical illustrator, providers, and care team towards execution of optimized self-health management plan  Over the next 120 days, patient will verbalize understanding of plan for effective management of HLD  Over the next 120 days, patient will work with [facility] and pcp to address needs related to HLD  Interventions:  Collaboration with [name], DO regarding development and update of comprehensive  plan of care as evidenced by provider attestation and co-signature  Inter-disciplinary care team collaboration (see longitudinal plan of care)  Medication review performed; medication list updated in electronic medical record.   Inter-disciplinary care team collaboration (see longitudinal plan of care)  Referred to pharmacy team for assistance with HLD medication management  Evaluation of current treatment plan related to HLD  and patient's adherence to plan as established by provider.  Advised patient to call the office for changes in condition or questions   Provided education to patient re: heart healthy diet and life style modifications   Reviewed medications with patient and discussed compliance. The granddaughter is not sure if the patient is taking medications as directed however the patient is stable at this time.  Discussed plans with patient for ongoing care management follow up and provided patient with direct contact information for care management team   Patient Goals/Self-Care Activities:  Over the next 120 days, patient will:   - call for medicine refill 2 or 3 days before it runs out - call if I am sick and can't take my medicine - keep a list of all the medicines I take; vitamins and herbals too - learn to read medicine labels - use a pillbox to sort medicine - use an alarm clock or phone to remind me to take my medicine - drink 6 to 8 glasses of water each day - fill half the plate with nonstarchy vegetables - limit fast food meals to no more than 1 per week - prepare main meal at home 3 to 5 days each week - read food labels for fat, fiber, carbohydrates and portion size - be open to making changes - I can manage, know and watch for signs of a heart attack - if I have chest pain, call for help - learn about small changes that will make a big difference - learn my personal risk factors - administration or use of medication demonstrated - barriers to  medication adherence identified - medication list compiled - medication list reviewed - medication-adherence assessment completed - medication reminder use encouraged - self-management plan initiated or updated - strategies for improving adherence encouraged - understanding of current medications assessed   Follow Up Plan: Telephone follow up appointment with care management team member scheduled for: 12-14-2020 at 10:30 am     Task: RNCM: Optimize Medication Use   Note:   Care Management Activities:    - administration or use of medication demonstrated - barriers to medication adherence identified - medication list compiled - medication list reviewed - medication-adherence assessment completed - medication reminder use encouraged - self-management plan initiated or updated - strategies for improving adherence encouraged - understanding of current medications assessed         Plan:Telephone follow up appointment with care management team member scheduled for:  12-14-2020 at 10:30 am  Alto Denver RN, MSN, CCM Community Care Coordinator [place]   [facility] Mobile: [phone]

## 2020-10-05 NOTE — Patient Instructions (Signed)
Visit Information  PATIENT GOALS: Goals Addressed            This Visit's Progress   . COMPLETED: RNCM: Chronic Disease Managment and suppor       Current Barriers: Closing this goal and opening in new ELS . Chronic Disease Management support, education, and care coordination needs related to HTN, HLD, CKD Stage 3, and Depression  Clinical Goal(s) related to HTN, HLD, CKD Stage 3, and Depression:  Over the next 120 days, patient will:  . Work with the care management team to address educational, disease management, and care coordination needs  . Begin or continue self health monitoring activities as directed today Measure and record blood pressure 2 times per week . Call provider office for new or worsened signs and symptoms Blood pressure findings outside established parameters, New or worsened symptom related to HTN, HLD, Chronic Kidney Disease 3, and depression . Call care management team with questions or concerns . Verbalize basic understanding of patient centered plan of care established today  Interventions related to HTN, HLD, CKD Stage 3, and Depression:  . Evaluation of current treatment plans and patient's adherence to plan as established by provider.  06-29-2020: [name] states that the patient is stable right now and seems to be doing well. Was only not able to pay her phone bill for this month but she will be able to pay for it next week. She had been doing better with her money but she goes to [employer] a lot. The granddaughter states she saw family recently and that always helps her mood. She also successfully completed her appointment with Dr. Laural Benes on 06-24-2020.  08-10-2020: The patient is stable at this time. Spent Thanksgiving with her family and she did well.  . Assessed patient understanding of disease states.  The granddaughter states she understands her chronic conditions but wants to talk to pcp about the patients mood changes and not acting age appropriate.  The granddaughter is open to ideas and recommendations from the care team. 08-10-2020: The patient was tested for COVID in November but it was negative. [name] states that she still has some symptoms of being sick but denies any acute distress.  . Assessed patient's education and care coordination needs. 06-07-2020: Will touch base with the LCSW to follow up with the patient granddaughter for recommendations and help with anxiety and depression. 08-10-2020: The patients granddaughter states that there are no needs at this time.  . Evaluation of HTN/HLD/CKD/Depression/Anxiety: The patients granddaughter does not have any vital sign readings for the [facility]. Attempts to reach the patient were unsuccessful. Empathetic listening and support given. The granddaughter feels the patient is  not safe but does wish she would consider talking to a psychiatrist.  The patient has refused this in the past. 08-10-2020: The granddaughter states that she is doing well right now.  . Provided disease specific education to patient. Education on help and resources to help with her depression and anxiety. The patient seems to be doing better since getting a smaller dog. The patient seems to be taking her medication at this time.  [name] with appropriate clinical care team members regarding patient needs.  Pharmacy and LCSW ongoing support and help with managing health and well being of the patient.   Patient Self Care Activities related to HTN, HLD, CKD Stage 3, and Depression:  . Patient is unable to independently self-manage chronic health conditions  Please see past updates related to this goal by clicking on the "  Past Updates" button in the selected goal      . COMPLETED: RNCM: Tell Dr. Laural Benes I stopped taking that other medicine. It makes me jittery       Current Barriers: closing this goal and opening in new ELS . Knowledge Deficits related to understanding the significance of taking medications as prescribed for  chronic medical conditions because the patient "feels good" . Lacks caregiver support. Daughter [name] Deceased x 2 years, Daughter [name] not involved, has a granddaughter [name] that helps when she can . Literacy barriers . Transportation barriers . Non-adherence to scheduled provider appointments . Non-adherence to prescribed medication regimen . Cognitive Deficits . Chronic Disease Management support and education needs related to HTN, HLD, Chronic Kidney Disease stage 3 and Depression  Nurse Case Manager Clinical Goal(s):  Marland Kitchen Over the next 120 days, patient will work with pcp and CCM team  to address needs related to management of chronic disease processes and taking prescribed medications as directed  . Over the next 120 days, patient will demonstrate a decrease in depression  exacerbations as evidenced by of the patient taking medications as directed . Over the next 120 days, patient will demonstrate improved health management independence as evidenced by following recommendations by the care team to help the patient with her health and well being . Over the next 90 days, patient will work with CM team pharmacist to manage medications and compliance concerns . Over the next 90 days, patient will work with CM clinical social worker to gain effective coping skills for her depression and the loss of her daughter 2 years ago . Over the next 90 days, patient will work with care guide team to assist with low income housing closer to her granddaughter in [place] . Over the next 90 days, the patient will demonstrate ongoing self health care management ability as evidenced by following the recommendations of the pcp and CCM team  Interventions:  . Evaluation of current treatment plan related to HTN, HLD, Chronic Kidney disease, and depression and patient's adherence to plan as established by provider. . Advised patient's granddaughter of appointment scheduled on 09-24-2020. Marland Kitchen Reviewed medications  with patient and discussed noncompliance of medication regimen and the need to take medications as directed. 06-07-2020: The patients granddaughter states that the patient tells her she is taking her medication as directed but she feels the patient is lying to her about it. [name] is overwhelmed with the amount of times the patient is calling her and texting her daily. She states that she does not know what to do. She said the APS case worker called her and was rude to her and told her they could not do anything about the patient having poor judgment.  [name] is seeking help in how to best manage the care of the patient. 06-29-2020: Currently the patient is stable at this time. The patients granddaughter states that she has been in a good mood lately.  Her nephew just had his first birthday party and she was with family. The patient has not been as interactive with [name] as they had an argument but she did ask [name] to take her next week to her cardiology appointment.  Marland Kitchen Collaborated with pcp and CCM team  regarding noncompliance of treatment regimen for chronic disease management.  08-10-2020: This is an ongoing issue with the patient per the patients granddaughter.  . Discussed plans with patient for ongoing care management follow up and provided patient with direct contact information for care management  team . Care Guide referral for assistance in finding low income housing in [place] so the patient can be closer to her granddaughter.  02-24-2020: the granddaughter verbalized she has checked into some places closer to her but the price is out of range. She is not sure she would move or not. The patient purchased a Zenaida Niece and has not told the granddaughter. The granddaughter says she has had a Zenaida Niece about 3 months. [name]'s brother Clide Cliff finally told her that the patient was allowing him to use it at this time until he could get his own transportation. [name] states that the patient has not been talking to  her much and has been avoiding her for about 3 weeks. 06-07-2020: The patients granddaughter states that she does not know how much longer she can help her grandmother in current state. She does not feel she is safe to drive and she is not taking her medications. She gets very easily upset. Social Worker is working with the patient and the patients granddaughter for depression and multiple issues related to grief and loss.  08-10-2020: Currently the patient is stable. No new concerns at this time. Will continue to monitor.  Marland Kitchen Pharmacy referral for noncompliance to prescribed medication regimen- the patient confirmed she is not taking any medications because she "feels good" and she stopped taking that "other medicine" because it made her feel "jittery" (medication fluoxetine for her depression)  Patient Self Care Activities:  . Patient verbalizes understanding of plan to see the pcp in the next 30 days to evaluate health and wellbeing . Self administers medications as prescribed . Attends all scheduled provider appointments . Calls provider office for new concerns or questions . Unable to independently manage chronic disease processes  . Unable to self administer medications as prescribed . Does not adhere to provider recommendations re: care plan and taking medications as prescribed  . Lacks social connections  Please see past updates related to this goal by clicking on the "Past Updates" button in the selected goal      . RNCM: Track and Manage My Symptoms-Depression       Timeframe:  Long-Range Goal Priority:  Medium Start Date:                             Expected End Date:       10-05-2020                Follow Up Date 12-14-2020   - avoid negative self-talk - develop a personal safety plan - develop a plan to deal with triggers like holidays, anniversaries - have a plan for how to handle bad days - spend time or talk with others at least 2 to 3 times per week - spend time or talk with  others every day - watch for early signs of feeling worse    Why is this important?    Keeping track of your progress will help your treatment team find the right mix of medicine and therapy for you.   Write in your journal every day.   Day-to-day changes in depression symptoms are normal. It may be more helpful to check your progress at the end of each week instead of every day.            Patient verbalizes understanding of instructions provided today and agrees to view in MyChart.   Telephone follow up appointment with care management team member scheduled for:  12-14-2020 at 10:30 am  Alto Denver RN, MSN, CCM Community Care Coordinator [facility]  [facility] Mobile: [phone]

## 2020-10-05 NOTE — Telephone Encounter (Signed)
  Chronic Care Management   Note  10/05/2020 Name: Renee Bridges MRN: [id number] DOB: 13-May-1946  The patients granddaughter called the RNCM back.  See new encounter.   Follow up plan: Telephone follow up appointment with care management team member scheduled for: 12-14-2020 at 11:15 am  Alto Denver RN, MSN, CCM Community Care Coordinator [place]  [facility] Mobile: [phone]

## 2020-10-13 ENCOUNTER — Telehealth: Payer: Self-pay | Admitting: Family Medicine

## 2020-10-13 NOTE — Telephone Encounter (Signed)
   Telephone encounter was:  Successful.  10/13/2020 Name: Renee Bridges MRN: [id number] DOB: 07-20-1946  Renee Bridges is a 75 y.o. year old female who is a primary care patient of [name], DO . The community resource team was consulted for assistance with Assistance with Driving Test Cost or Transportation  Care guide performed the following interventions: Spoke with patient's granddaughter [name]. She stated that [name] is not going to pursue getting her license at this time. But she needs assistance with de-cluttering her home. Informed [name] that if  [name] has things she would like to donate she could possible give [employer] a call to see if they will come out and pick things up for her. Care Guide will also research other resources within the community. [name] stated understanding.   Follow Up Plan:  Care guide will follow up with patient by phone over the next week.   [facility] Care Guide, Embedded Care Coordination [facility], Care Management Phone: [phone] Email: [email or web address]

## 2020-10-15 NOTE — Telephone Encounter (Signed)
° °  Telephone encounter was:  Successful.  10/15/2020 Name: HENCHY MCCAULEY MRN: [id number] DOB: April 14, 1946  SHAYLA HEMING is a 75 y.o. year old female who is a primary care patient of [name], DO . The community resource team was consulted for assistance with Driving Test and Transportation  Care guide performed the following interventions: Spoke with patient's granddaughter Jessisca regarding referral.  Informed [name] that Care Guide checked to see if the [employer] will come out to pick up donated items and they will she will need to call the [employer] that is in the area to set up a date and time for them to pick up the items. [name] stated understanding. No addtional needs at this time. .  Follow Up Plan:  No further follow up planned at this time. The patient has been provided with needed resources. Referral will closed.   [facility] [place] Care Guide, Embedded Care Coordination [facility], Care Management Phone: [phone] Email: [email or web address]

## 2020-10-18 ENCOUNTER — Ambulatory Visit: Payer: Medicare Other | Admitting: Cardiology

## 2020-10-18 ENCOUNTER — Ambulatory Visit: Payer: Medicaid Other | Admitting: Licensed Clinical Social Worker

## 2020-10-18 DIAGNOSIS — F339 Major depressive disorder, recurrent, unspecified: Secondary | ICD-10-CM | POA: Diagnosis not present

## 2020-10-18 DIAGNOSIS — F419 Anxiety disorder, unspecified: Secondary | ICD-10-CM

## 2020-10-18 DIAGNOSIS — N183 Chronic kidney disease, stage 3 unspecified: Secondary | ICD-10-CM

## 2020-10-18 DIAGNOSIS — E782 Mixed hyperlipidemia: Secondary | ICD-10-CM

## 2020-10-18 NOTE — Chronic Care Management (AMB) (Signed)
Chronic Care Management    Clinical Social Work Note  10/18/2020 Name: Renee Bridges MRN: [id number] DOB: [birth year]  Renee Bridges is a 75 y.o. year old female who is a primary care patient of [name], DO. The CCM team was consulted to assist the patient with chronic disease management and/or care coordination needs related to: Level of Care Concerns.   Engaged with patient by telephone for follow up visit in response to provider referral for social work chronic care management and care coordination services.   Consent to Services:  The patient was given the following information about Chronic Care Management services today, agreed to services, and gave verbal consent: 1. CCM service includes personalized support from designated clinical staff supervised by the primary care provider, including individualized plan of care and coordination with other care providers 2. 24/7 contact phone numbers for assistance for urgent and routine care needs. 3. Service will only be billed when office clinical staff spend 20 minutes or more in a month to coordinate care. 4. Only one practitioner may furnish and bill the service in a calendar month. 5.The patient may stop CCM services at any time (effective at the end of the month) by phone call to the office staff. 6. The patient will be responsible for cost sharing (co-pay) of up to 20% of the service fee (after annual deductible is met). Patient agreed to services and consent obtained.  Patient agreed to services and consent obtained.   Assessment: Review of patient past medical history, allergies, medications, and health status, including review of relevant consultants reports was performed today as part of a comprehensive evaluation and provision of chronic care management and care coordination services.     SDOH (Social Determinants of Health) assessments and interventions performed:    Advanced Directives Status: See Care Plan for related  entries.  CCM Care Plan  Allergies  Allergen Reactions  . Bactrim [Sulfamethoxazole-Trimethoprim] Other (See Comments)    Sweating, feeling like throat is closing up  . Pollen Extract   . Codeine Rash    Outpatient Encounter Medications as of 10/18/2020  Medication Sig  . atorvastatin (LIPITOR) 20 MG tablet Take 1 tablet (20 mg total) by mouth daily.  . QUEtiapine (SEROQUEL XR) 50 MG TB24 24 hr tablet Take 1 tablet (50 mg total) by mouth at bedtime.   No facility-administered encounter medications on file as of 10/18/2020.    Patient Active Problem List   Diagnosis Date Noted  . Driving safety issue 94/70/1104  . Hyperlipidemia 04/03/2018  . Mental status, decreased 12/28/2017  . Depression, recurrent (HCC) 10/16/2017  . Advanced care planning/counseling discussion 06/01/2017  . Chronic kidney disease, stage 3 (HCC) 03/21/2017  . Chronic back pain 09/25/2016  . Cyst of right kidney 07/17/2016  . Benign hypertensive renal disease 12/27/2015  . Failed total left knee replacement (HCC) 05/13/2015  . Anxiety 03/25/2015  . Urge incontinence 06/30/2013    Conditions to be addressed/monitored: Anxiety; Limited social support, Level of care concerns and Mental Health Concerns   Care Plan : General Social Work (Adult)  Updates made by Gustavus Bryant, LCSW since 10/18/2020 12:00 AM    Problem: Quality of Life (General Plan of Care)     Long-Range Goal: Quality of Life Maintained   Start Date: 10/18/2020  Priority: Medium  Note:   Evidence-based guidance:   Assess patient's thoughts about quality of life, goals and expectations, and dissatisfaction or desire to improve.   Identify issues of primary  importance such as mental health, illness, exercise tolerance, pain, sexual function and intimacy, cognitive change, social isolation, finances and relationships.   Assess and monitor for signs/symptoms of psychosocial concerns, especially depression or ideations regarding harm to  others or self; provide or refer for mental health services as needed.   Identify sensory issues that impact quality of life such as hearing loss, vision deficit; strategize ways to maintain or improve hearing, vision.   Promote access to services in the community to support independence such as support groups, home visiting programs, financial assistance, handicapped parking tags, durable medical equipment and emergency responder.   Promote activities to decrease social isolation such as group support or social, leisure and recreational activities, employment, use of social media; consider safety concerns about being out of home for activities.   Provide patient an opportunity to share by storytelling or a "life review" to give positive meaning to life and to assist with coping and negative experiences.   Encourage patient to tap into hope to improve sense of self.   Counsel based on prognosis and as early as possible about end-of-life and palliative care; consider referral to palliative care provider.   Advocate for the development of palliative care plan that may include avoidance of unnecessary testing and intervention, symptom control, discontinuation of medications, hospice and organ donation.   Counsel as early as possible those with life-limiting chronic disease about palliative care; consider referral to palliative care provider.   Advocate for the development of palliative care plan.   Notes:   Timeframe:  Long-Range Goal Priority:  Medium  Start Date:  10/18/20                         Expected End Date:  01/15/21                    Follow Up Date- 12/13/20  Current Barriers:  . Chronic Mental Health needs related to Depression and Grief . Financial constraints related to managing health care expenses . Limited social support . ADL IADL limitations . Mental Health Concerns  . Limited access to caregiver . Cognitive Deficits . Memory Deficits . Inability to perform ADL's  independently . Inability to perform IADL's independently . Suicidal Ideation/Homicidal Ideation: No  Clinical Social Work Goal(s):  Marland Kitchen Over the next 120 days, patient will work with SW bi-monthly by telephone or in person to reduce or manage symptoms related to depression, grief, anxiety and ongoing stressors . Over the next 120 days, patient will demonstrate improved health management independence as evidenced by implementing positive self-care into her daily routines  Interventions: . Patient interviewed and appropriate assessments performed: brief mental health assessment . LCSW completed call to patient's caregiver/granddaughter [name] and provided family with personal care service resource education . Patient was deemed competent by APS after LCSW filed cased back in August of 2021. . Granddaughter [name] reports that patient often isolates where she resides now and she feels that patient would thrive more if she were to relocate near her and her family in order to gain socialization. [name] reports living in [place] area. Family is wanting C3 referral for low income housing resources for patient to relocate there. Resource education has been provided to family by C3 Guide but no relocation has been completed. [facility] team is now currently involved with patient for financial support. . Granddaughter reports that patient is stable but was upset with her because she went out  of town this weekend to visit her in [place] and was not able to take her. CCM LCSW provided caregiver with stress management coping skills to help decrease caregiver burn out and enforce healthy boundaries.  . Provided patient with information about available mental health resources within the nearby area . Discussed plans with patient for ongoing care management follow up and provided patient with direct contact information for care management team . Advised patient to contact CCM LCSW directly if case  management needs arise . Collaborated with primary care provider re: Patient has new small dog that is helping her cope with the loss of her last one but granddaughter reports concern around her ability to take appropriate care of this dog. LCSW reported this concern to APS previously.  . Assisted patient/caregiver with obtaining information about health plan benefits . Provided education and assistance to client regarding Advanced Directives. . A voluntary and extensive discussion about advanced care planning including explanation and discussion of advanced was undertaken with the patient and [name] (caretaker). Explanation regarding healthcare proxy and living will was reviewed and packet with forms with explanation of how to fill them out was given.   . Provided education to patient/caregiver regarding level of care options. . Provided education to patient/caregiver about Hospice and/or Palliative Care services . The granddaughter feels the patient is not taking her medications as directed. The granddaughter does not feel like the patient needs to be driving but patient continues to do so. The patient sometimes avoids talking to granddaughter because of ongoing conflict with granddaughter being concerned about her current level of care.  . The granddaughter is concerned about the patient driving. She feels if she is taking her medications as directed that it is not an issue; however if she goes out driving in a rage she is concerned that she will hurt herself or someone else.   . The patients granddaughter reports ongoing concern for patient's ability to manager her own finances. Patient was previously put out of $1000 due to scam calls.  . Encouraged family to consider [place] care for grief follow up and therapy/counseling . Solution-Focused Strategies education provided to family . LCSW filed APS report on patient with [facility] on 04/21/20 and included PCP, sister, granddaughter and CCM  Nurse on report. APS found patient to be competent so although patient is not making the best decisions regarding her care, she has the right do so. Granddaughter shares that patient is stable for now. . Patient received recent socialization at a family's birthday party which elevated her mood.    Patient Self Care Activities:  . Attends all scheduled provider appointments . Strong family or social support  Patient Coping Strengths:  . Family . Hopefulness  Patient Self Care Deficits:  . Lacks social connections . Unable to perform ADLs independently . Unable to perform IADLs independently  Please see past updates related to this goal by clicking on the "Past Updates" button in the selected goal     Task: Support and Maintain Acceptable Degree of Health, Comfort and Happiness   Note:   Care Management Activities:    - affirmation provided - community involvement promoted - expression of thoughts about present/future encouraged - independence in all possible areas promoted - life review by storytelling encouraged - patient strengths promoted - psychosocial concerns monitored - self-expression encouraged - sleep diary encouraged - sleep hygiene techniques encouraged - social relationships promoted - strategies to maintain hearing and/or vision promoted - strategies to maintain intimacy promoted -  wellness behaviors promoted    Notes:       Follow Up Plan: SW will follow up with patient by phone over the next quarter      Eula Fried, BSW, MSW, [email or web address] Phone: [phone]

## 2020-10-25 ENCOUNTER — Ambulatory Visit: Payer: Medicare Other | Admitting: Cardiology

## 2020-10-26 ENCOUNTER — Encounter: Payer: Self-pay | Admitting: Cardiology

## 2020-11-03 ENCOUNTER — Telehealth: Payer: Self-pay

## 2020-11-03 NOTE — Telephone Encounter (Signed)
Copied from CRM [id number]. Topic: General - Other >> Nov 03, 2020  9:50 AM Jaquita Rector A wrote: Reason for CRM: Patient grand daughter Brayton Caves called in to inquire of Dr Laural Benes toplease upload the letter for patient to be excused from Jury Duty to her My Chart so that she can print it and get it to the courts. Any questions please call Ph# [phone]

## 2020-11-05 ENCOUNTER — Encounter: Payer: Self-pay | Admitting: Family Medicine

## 2020-11-05 NOTE — Telephone Encounter (Signed)
done

## 2020-12-13 ENCOUNTER — Telehealth: Payer: Self-pay | Admitting: Licensed Clinical Social Worker

## 2020-12-13 ENCOUNTER — Telehealth: Payer: Medicaid Other

## 2020-12-13 NOTE — Telephone Encounter (Signed)
  Chronic Care Management    Clinical Social Work General Follow Up Note  12/13/2020 Name: Renee Bridges MRN: [id number] DOB: 05-Jan-1946  Renee Bridges is a 75 y.o. year old female who is a primary care patient of [name], DO. The CCM team was consulted for assistance with [employer]  and Level of Care Concerns.   Review of patient status, including review of consultants reports, relevant laboratory and other test results, and collaboration with appropriate care team members and the patient's provider was performed as part of comprehensive patient evaluation and provision of chronic care management services.    LCSW completed CCM outreach attempt today but was unable to reach patient successfully. A HIPPA compliant voice message was left encouraging patient to return call once available. LCSW will ask Scheduling Care Guide to reschedule CCM SW appointment with patient as well.  Outpatient Encounter Medications as of 12/13/2020  Medication Sig  . atorvastatin (LIPITOR) 20 MG tablet Take 1 tablet (20 mg total) by mouth daily.  . QUEtiapine (SEROQUEL XR) 50 MG TB24 24 hr tablet Take 1 tablet (50 mg total) by mouth at bedtime.   No facility-administered encounter medications on file as of 12/13/2020.   Follow Up Plan: Scheduling Care Guide will reach out to patient to reschedule appointment.   Dickie La, BSW, MSW, LCSW [facility] Care Management Central  [facility] [email or web address] Phone: [phone]

## 2020-12-14 ENCOUNTER — Telehealth: Payer: Medicaid Other

## 2020-12-14 ENCOUNTER — Telehealth: Payer: Self-pay | Admitting: General Practice

## 2020-12-14 NOTE — Telephone Encounter (Signed)
  Chronic Care Management   Outreach Note  12/14/2020 Name: Renee Bridges MRN: [id number] DOB: 1946/01/13  Referred by: [name], DO Reason for referral : Appointment (RNCM: Follow up for Chronic Disease Management and Care Coordination Needs )   An unsuccessful telephone outreach was attempted today. The patient was referred to the case management team for assistance with care management and care coordination.   Follow Up Plan: A HIPAA compliant phone message was left for the patient providing contact information and requesting a return call.   Alto Denver RN, MSN, CCM Community Care Coordinator [place]  [facility] Mobile: [phone]

## 2020-12-20 ENCOUNTER — Telehealth: Payer: Self-pay | Admitting: *Deleted

## 2020-12-20 NOTE — Chronic Care Management (AMB) (Signed)
  Care Management   Note  12/20/2020 Name: Renee Bridges MRN: [id number] DOB: 20-Aug-1946  Renee Bridges is a 75 y.o. year old female who is a primary care patient of [name], DO and is actively engaged with the care management team. I reached out to [name] by phone today to assist with re-scheduling a follow up visit with the Licensed Clinical Social Worker  Follow up plan: Unsuccessful telephone outreach attempt made. A HIPAA compliant phone message was left for the patient providing contact information and requesting a return call.  The care management team will reach out to the patient again over the next 5 days.  If patient returns call to provider office, please advise to call Embedded Care Management Care Guide Avie Arenas at [phone]  Howie Rufus [facility] Guide, Embedded Care Coordination [facility]  Care Management

## 2020-12-20 NOTE — Telephone Encounter (Signed)
Please reschedule LCSW f/u at [facility] new LCSW is [facility]

## 2020-12-23 NOTE — Chronic Care Management (AMB) (Signed)
  Care Management   Note  12/23/2020 Name: Renee Bridges MRN: [id number] DOB: Apr 11, 1946  Renee Bridges is a 75 y.o. year old female who is a primary care patient of [name], DO and is actively engaged with the care management team. I reached out to [name] by phone today to assist with re-scheduling a follow up visit with the Licensed Clinical Social Worker  Follow up plan: A second unsuccessful telephone outreach attempt made. A HIPAA compliant phone message was left for the patient providing contact information and requesting a return call.  The care management team will reach out to the patient again over the next 5 days.  If patient returns call to provider office, please advise to call Embedded Care Management Care Guide Avie Arenas at [phone]  Michaeljoseph [facility] Guide, Embedded Care Coordination [facility]  Care Management

## 2020-12-24 ENCOUNTER — Telehealth: Payer: Self-pay | Admitting: *Deleted

## 2020-12-24 NOTE — Chronic Care Management (AMB) (Signed)
  Care Management   Note  12/24/2020 Name: [name] MRN: [id number] DOB: [birth year]  Renee Bridges is a 75 y.o. year old female who is a primary care patient of [name], DO and is actively engaged with the care management team. I reached out to [name] by phone today to assist with re-scheduling a follow up visit with the Licensed Clinical Social Worker  Follow up plan: A second unsuccessful telephone outreach attempt made. A HIPAA compliant phone message was left for the patient providing contact information and requesting a return call. The care management team will reach out to the patient again over the next 7 days. If patient returns call to provider office, please advise to call Embedded Care Management Care Guide Gwenevere Ghazi at [phone].  Gwenevere Ghazi  Care Guide, Embedded Care Coordination [facility] Management

## 2021-01-06 ENCOUNTER — Telehealth: Payer: Self-pay | Admitting: *Deleted

## 2021-01-06 NOTE — Chronic Care Management (AMB) (Signed)
  Care Management   Note  01/06/2021 Name: [name] MRN: [id number] DOB: Apr 14, 1946  Renee Bridges is a 75 y.o. year old female who is a primary care patient of [name], DO and is actively engaged with the care management team. I reached out to [name] by phone today to assist with re-scheduling a follow up visit with the Licensed Clinical Social Worker  Follow up plan: Unsuccessful telephone outreach attempt made. A HIPAA compliant phone message was left for the patient providing contact information and requesting a return call. Unable to make contact on outreach attempts x 3. PCP [name], DO.  notified via routed documentation in medical record.  We have been unable to make contact with the patient for follow up. The care management team is available to follow up with the patient after provider conversation with the patient regarding recommendation for care management engagement and subsequent re-referral to the care management team.   [facility] Guide, Embedded Care Coordination [facility]  Care Management  Direct Dial: [phone]

## 2021-01-26 ENCOUNTER — Telehealth: Payer: Medicaid Other

## 2021-02-23 ENCOUNTER — Telehealth: Payer: Medicaid Other | Admitting: General Practice

## 2021-02-23 ENCOUNTER — Ambulatory Visit (INDEPENDENT_AMBULATORY_CARE_PROVIDER_SITE_OTHER): Payer: Medicare Other | Admitting: General Practice

## 2021-02-23 DIAGNOSIS — E782 Mixed hyperlipidemia: Secondary | ICD-10-CM

## 2021-02-23 DIAGNOSIS — F339 Major depressive disorder, recurrent, unspecified: Secondary | ICD-10-CM | POA: Diagnosis not present

## 2021-02-23 NOTE — Progress Notes (Signed)
Please write Rx for shower chair and I'll sign

## 2021-02-23 NOTE — Chronic Care Management (AMB) (Signed)
Chronic Care Management   CCM RN Visit Note  02/23/2021 Name: Renee Bridges MRN: [id number] DOB: [birth year]  Subjective: Renee Bridges is a 75 y.o. year old female who is a primary care patient of [name], DO. The care management team was consulted for assistance with disease management and care coordination needs.    Engaged with patient by telephone for follow up visit in response to provider referral for case management and/or care coordination services.   Consent to Services:  The patient was given information about Chronic Care Management services, agreed to services, and gave verbal consent prior to initiation of services.  Please see initial visit note for detailed documentation.   Patient agreed to services and verbal consent obtained.   Assessment: Review of patient past medical history, allergies, medications, health status, including review of consultants reports, laboratory and other test data, was performed as part of comprehensive evaluation and provision of chronic care management services.   SDOH (Social Determinants of Health) assessments and interventions performed:    CCM Care Plan  Allergies  Allergen Reactions   Bactrim [Sulfamethoxazole-Trimethoprim] Other (See Comments)    Sweating, feeling like throat is closing up   Pollen Extract    Codeine Rash    Outpatient Encounter Medications as of 02/23/2021  Medication Sig   atorvastatin (LIPITOR) 20 MG tablet Take 1 tablet (20 mg total) by mouth daily.   QUEtiapine (SEROQUEL XR) 50 MG TB24 24 hr tablet Take 1 tablet (50 mg total) by mouth at bedtime.   No facility-administered encounter medications on file as of 02/23/2021.    Patient Active Problem List   Diagnosis Date Noted   Driving safety issue 54/27/0623   Hyperlipidemia 04/03/2018   Mental status, decreased 12/28/2017   Depression, recurrent (HCC) 10/16/2017   Advanced care planning/counseling discussion 06/01/2017   Chronic kidney disease,  stage 3 (HCC) 03/21/2017   Chronic back pain 09/25/2016   Cyst of right kidney 07/17/2016   Benign hypertensive renal disease 12/27/2015   Failed total left knee replacement (HCC) 05/13/2015   Anxiety 03/25/2015   Urge incontinence 06/30/2013    Conditions to be addressed/monitored:HLD and Depression  Care Plan : RNCM: Depression (Adult)  Updates made by Marlowe Sax since 02/23/2021 12:00 AM     Problem: RNCM: Depression Identification (Depression)   Priority: Medium     Goal: RNCM: Depressive Symptoms Identified   Priority: Medium  Note:   Current Barriers:  Knowledge Deficits related to effective management of depression and anxiety  Care Coordination needs related to cost constraints for paying for occupational therapy evaluation  in a patient with the need to have a safe driving evaluation. 02-23-2021: This has been resolved. The patient has decided not to get her drivers license renewed and she has sold her Z[employer] Niece.  Chronic Disease Management support and education needs related to chronic conditions of depression and anxiety.  Lacks caregiver support.  Corporate treasurer.  Non-adherence to scheduled provider appointments Non-adherence to prescribed medication regimen Unable to independently manage exacerbations in depression and anxiety  Does not attend all scheduled provider appointments Does not adhere to prescribed medication regimen Lacks social connections Does not contact provider office for questions/concerns  Nurse Case Manager Clinical Goal(s):  Over the next 120 days, patient will verbalize understanding of plan for effective management of depression and anxiety  Over the next 120 days, patient will work with [facility] and pcp to address needs related to depression and anxiety  Over the next 120  days, patient will demonstrate a decrease in anxiety and depression  exacerbations as evidenced by normalized mood and no exacerbations of depression and anxiety  Over the  next 120 days, patient will demonstrate improved health management independence as evidenced bysablized mood, no changes in current state of depression or anxiety   Interventions:  1:1 collaboration with [name], DO regarding development and update of comprehensive plan of care as evidenced by provider attestation and co-signature Inter-disciplinary care team collaboration (see longitudinal plan of care) Evaluation of current treatment plan related to depression and anxiety  and patient's adherence to plan as established by provider. 02-23-2021: The patients granddaughter, [name] states the patient is doing well currently. The patient has sold her [employer] Niece and is not going to get her license renewed. The patient does not have any issues that are causing increased depression or anxiety at this time.   Advised patient to call the office for changes in mood/anxiety/depression Provided education to patient re: related to safe driving and help with resources to pay for an occupational health evaluation to see if the patient is safe to drive. The patients granddaughter states that the patient now wants to sell her Zena[employer] Niece and get an ID. She was concerned that she would not have an ID and that is why she wanted to keep her license. Have done a care guide referral to see if there are resources to help with cost of occupational therapy needs. 02-23-2021: The patient is no longer actively wanting to get her license renewed or drive. She is taking the bus when needed and her granddaughter takes her to the grocery store to get the things she needs. Currently things are going well for the patient.  Reviewed medications with patient and discussed compliance. The granddaughter does not know if she is taking her medications as directed but states she is doing well at this time. 02-23-2021: The granddaughter feels the patient is taking her medications correctly at this time.  Care Guide referral for resources in the community  to help with cost of occupational health. 02-23-2021- this has been completed.  Discussed plans with patient for ongoing care management follow up and provided patient with direct contact information for care management team Collaboration with Dr. Laural Benes and the clinical staff. The patients granddaughter states the patients shower chair is broken and she needs a new chair. Will ask for new script to get a new shower chair for the patient. Review of safety in the home with [name].   Patient Goals/Self-Care Activities Over the next 120 days, patient will:  - Patient will self administer medications as prescribed Patient will attend all scheduled provider appointments Patient will call pharmacy for medication refills Patient will attend church or other social activities Patient will continue to perform IADL's independently Patient will call provider office for new concerns or questions Patient will work with BSW to address care coordination needs and will continue to work with the clinical team to address health care and disease management related needs.   - anxiety screen reviewed - depression screen reviewed - medication list reviewed  Follow Up Plan: Telephone follow up appointment with care management team member scheduled for: 05-18-2021 at 0900 am        Care Plan : RNCM: HLD Management  Updates made by Marlowe Sax since 02/23/2021 12:00 AM     Problem: RNCM: HLD Management   Priority: Medium     Long-Range Goal: RNCM: HLD Management   Priority: Medium  Note:  Current Barriers:  Poorly controlled hyperlipidemia, complicated by depression, anxiety, non-compliance at times Current antihyperlipidemic regimen: atorvastatin 20 mg qd- not always compliant Most recent lipid panel:     Component Value Date/Time   CHOL 208 (H) 10/04/2020 1139   TRIG 146 10/04/2020 1139   HDL 41 10/04/2020 1139   LDLCALC 141 (H) 10/04/2020 1139   ASCVD risk enhancing conditions: age >39, HTN,  CKD Unable to independently manage HLD  Unable to self administer medications as prescribed Does not adhere to prescribed medication regimen Lacks social connections Does not contact provider office for questions/concerns  RN Care Manager Clinical Goal(s):  Over the next 120 days, patient will work with Medical illustrator, providers, and care team towards execution of optimized self-health management plan Over the next 120 days, patient will verbalize understanding of plan for effective management of HLD Over the next 120 days, patient will work with [facility] and pcp to address needs related to HLD  Interventions: Collaboration with [name], DO regarding development and update of comprehensive plan of care as evidenced by provider attestation and co-signature Inter-disciplinary care team collaboration (see longitudinal plan of care) Medication review performed; medication list updated in electronic medical record.  Inter-disciplinary care team collaboration (see longitudinal plan of care) Referred to pharmacy team for assistance with HLD medication management Evaluation of current treatment plan related to HLD  and patient's adherence to plan as established by provider. 02-23-2021: The patient is doing well and no issues at this time.  Advised patient to call the office for changes in condition or questions.   Provided education to patient re: heart healthy diet and life style modifications. 02-23-2021: The patient is eating well and denies any issues with heart  healthy diet.  Reviewed medications with patient and discussed compliance. The granddaughter is not sure if the patient is taking medications as directed however the patient is stable at this time. Discussed plans with patient for ongoing care management follow up and provided patient with direct contact information for care management team   Patient Goals/Self-Care Activities: Over the next 120 days, patient will:   - call for  medicine refill 2 or 3 days before it runs out - call if I am sick and can't take my medicine - keep a list of all the medicines I take; vitamins and herbals too - learn to read medicine labels - use a pillbox to sort medicine - use an alarm clock or phone to remind me to take my medicine - drink 6 to 8 glasses of water each day - fill half the plate with nonstarchy vegetables - limit fast food meals to no more than 1 per week - prepare main meal at home 3 to 5 days each week - read food labels for fat, fiber, carbohydrates and portion size - be open to making changes - I can manage, know and watch for signs of a heart attack - if I have chest pain, call for help - learn about small changes that will make a big difference - learn my personal risk factors - administration or use of medication demonstrated - barriers to medication adherence identified - medication list compiled - medication list reviewed - medication-adherence assessment completed - medication reminder use encouraged - self-management plan initiated or updated - strategies for improving adherence encouraged - understanding of current medications assessed   Follow Up Plan: Telephone follow up appointment with care management team member scheduled for: 05-18-2021 at 0900 am  Plan:Telephone follow up appointment with care management team member scheduled for:  05-18-2021 at 0900 am  Alto DenverPam Tate RN, MSN, CCM Community Care Coordinator [place]  [facility] Mobile: [phone]

## 2021-02-23 NOTE — Patient Instructions (Signed)
Visit Information  PATIENT GOALS:  Goals Addressed             This Visit's Progress    COMPLETED: Increase water intake       Recommend drinking at least 6-6 glasses of water a day      COMPLETED: RNCM: Track and Manage My Symptoms-Depression       Timeframe:  Long-Range Goal Priority:  Medium Start Date:                             Expected End Date:       10-05-2020                Follow Up Date 12-14-2020   - avoid negative self-talk - develop a personal safety plan - develop a plan to deal with triggers like holidays, anniversaries - have a plan for how to handle bad days - spend time or talk with others at least 2 to 3 times per week - spend time or talk with others every day - watch for early signs of feeling worse    Why is this important?   Keeping track of your progress will help your treatment team find the right mix of medicine and therapy for you.  Write in your journal every day.  Day-to-day changes in depression symptoms are normal. It may be more helpful to check your progress at the end of each week instead of every day.     02-23-2021: The patient is doing well and there is no issues with active anxiety or depression at this time. She has decided to sell her Z[employer] Niecevan and not get her drivers license renewed.         Patient Care Plan: RNCM: Depression (Adult)     Problem Identified: RNCM: Depression Identification (Depression)   Priority: Medium     Goal: RNCM: Depressive Symptoms Identified   Priority: Medium  Note:   Current Barriers:  Knowledge Deficits related to effective management of depression and anxiety  Care Coordination needs related to cost constraints for paying for occupational therapy evaluation  in a patient with the need to have a safe driving evaluation. 02-23-2021: This has been resolved. The patient has decided not to get her drivers license renewed and she has sold her Z[employer] Niecevan.  Chronic Disease Management support and education needs  related to chronic conditions of depression and anxiety.  Lacks caregiver support.  Corporate treasurerinancial Constraints.  Non-adherence to scheduled provider appointments Non-adherence to prescribed medication regimen Unable to independently manage exacerbations in depression and anxiety  Does not attend all scheduled provider appointments Does not adhere to prescribed medication regimen Lacks social connections Does not contact provider office for questions/concerns  Nurse Case Manager Clinical Goal(s):  Over the next 120 days, patient will verbalize understanding of plan for effective management of depression and anxiety  Over the next 120 days, patient will work with RNCM and pcp to address needs related to depression and anxiety  Over the next 120 days, patient will demonstrate a decrease in anxiety and depression  exacerbations as evidenced by normalized mood and no exacerbations of depression and anxiety  Over the next 120 days, patient will demonstrate improved health management independence as evidenced bysablized mood, no changes in current state of depression or anxiety   Interventions:  1:1 collaboration with Dorcas CarrowJohnson, Megan P, DO regarding development and update of comprehensive plan of care as evidenced by provider attestation and co-signature Inter-disciplinary  care team collaboration (see longitudinal plan of care) Evaluation of current treatment plan related to depression and anxiety  and patient's adherence to plan as established by provider. 02-23-2021: The patients granddaughter, [name] states the patient is doing well currently. The patient has sold her Z[employer] Niece and is not going to get her license renewed. The patient does not have any issues that are causing increased depression or anxiety at this time.   Advised patient to call the office for changes in mood/anxiety/depression Provided education to patient re: related to safe driving and help with resources to pay for an occupational health  evaluation to see if the patient is safe to drive. The patients granddaughter states that the patient now wants to sell her Zenaida Niece and get an ID. She was concerned that she would not have an ID and that is why she wanted to keep her license. Have done a care guide referral to see if there are resources to help with cost of occupational therapy needs. 02-23-2021: The patient is no longer actively wanting to get her license renewed or drive. She is taking the bus when needed and her granddaughter takes her to the grocery store to get the things she needs. Currently things are going well for the patient.  Reviewed medications with patient and discussed compliance. The granddaughter does not know if she is taking her medications as directed but states she is doing well at this time. 02-23-2021: The granddaughter feels the patient is taking her medications correctly at this time.  Care Guide referral for resources in the community to help with cost of occupational health. 02-23-2021- this has been completed.  Discussed plans with patient for ongoing care management follow up and provided patient with direct contact information for care management team Collaboration with Dr. Laural Benes and the clinical staff. The patients granddaughter states the patients shower chair is broken and she needs a new chair. Will ask for new script to get a new shower chair for the patient. Review of safety in the home with [name].   Patient Goals/Self-Care Activities Over the next 120 days, patient will:  - Patient will self administer medications as prescribed Patient will attend all scheduled provider appointments Patient will call pharmacy for medication refills Patient will attend church or other social activities Patient will continue to perform IADL's independently Patient will call provider office for new concerns or questions Patient will work with BSW to address care coordination needs and will continue to work with the  clinical team to address health care and disease management related needs.   - anxiety screen reviewed - depression screen reviewed - medication list reviewed  Follow Up Plan: Telephone follow up appointment with care management team member scheduled for: 05-18-2021 at 0900 am        Task: RNCM: Identify Depressive Symptoms and Facilitate Treatment   Note:   Care Management Activities:    - anxiety screen reviewed - depression screen reviewed - medication list reviewed        Patient Care Plan: RNCM: HLD Management     Problem Identified: RNCM: HLD Management   Priority: Medium     Long-Range Goal: RNCM: HLD Management   Priority: Medium  Note:   Current Barriers:  Poorly controlled hyperlipidemia, complicated by depression, anxiety, non-compliance at times Current antihyperlipidemic regimen: atorvastatin 20 mg qd- not always compliant Most recent lipid panel:     Component Value Date/Time   CHOL 208 (H) 10/04/2020 1139   TRIG 146 10/04/2020 1139  HDL 41 10/04/2020 1139   LDLCALC 141 (H) 10/04/2020 1139   ASCVD risk enhancing conditions: age >28, HTN, CKD Unable to independently manage HLD  Unable to self administer medications as prescribed Does not adhere to prescribed medication regimen Lacks social connections Does not contact provider office for questions/concerns  RN Care Manager Clinical Goal(s):  Over the next 120 days, patient will work with Medical illustrator, providers, and care team towards execution of optimized self-health management plan Over the next 120 days, patient will verbalize understanding of plan for effective management of HLD Over the next 120 days, patient will work with [facility] and pcp to address needs related to HLD  Interventions: Collaboration with [name], DO regarding development and update of comprehensive plan of care as evidenced by provider attestation and co-signature Inter-disciplinary care team collaboration (see  longitudinal plan of care) Medication review performed; medication list updated in electronic medical record.  Inter-disciplinary care team collaboration (see longitudinal plan of care) Referred to pharmacy team for assistance with HLD medication management Evaluation of current treatment plan related to HLD  and patient's adherence to plan as established by provider. 02-23-2021: The patient is doing well and no issues at this time.  Advised patient to call the office for changes in condition or questions.   Provided education to patient re: heart healthy diet and life style modifications. 02-23-2021: The patient is eating well and denies any issues with heart  healthy diet.  Reviewed medications with patient and discussed compliance. The granddaughter is not sure if the patient is taking medications as directed however the patient is stable at this time. Discussed plans with patient for ongoing care management follow up and provided patient with direct contact information for care management team   Patient Goals/Self-Care Activities: Over the next 120 days, patient will:   - call for medicine refill 2 or 3 days before it runs out - call if I am sick and can't take my medicine - keep a list of all the medicines I take; vitamins and herbals too - learn to read medicine labels - use a pillbox to sort medicine - use an alarm clock or phone to remind me to take my medicine - drink 6 to 8 glasses of water each day - fill half the plate with nonstarchy vegetables - limit fast food meals to no more than 1 per week - prepare main meal at home 3 to 5 days each week - read food labels for fat, fiber, carbohydrates and portion size - be open to making changes - I can manage, know and watch for signs of a heart attack - if I have chest pain, call for help - learn about small changes that will make a big difference - learn my personal risk factors - administration or use of medication demonstrated -  barriers to medication adherence identified - medication list compiled - medication list reviewed - medication-adherence assessment completed - medication reminder use encouraged - self-management plan initiated or updated - strategies for improving adherence encouraged - understanding of current medications assessed   Follow Up Plan: Telephone follow up appointment with care management team member scheduled for: 05-18-2021 at 0900 am      Task: RNCM: Optimize Medication Use   Note:   Care Management Activities:    - administration or use of medication demonstrated - barriers to medication adherence identified - medication list compiled - medication list reviewed - medication-adherence assessment completed - medication reminder use encouraged - self-management  plan initiated or updated - strategies for improving adherence encouraged - understanding of current medications assessed        Patient Care Plan: General Social Work (Adult)     Problem Identified: Quality of Life (General Plan of Care)      Long-Range Goal: Quality of Life Maintained   Start Date: 10/18/2020  Priority: Medium  Note:   Evidence-based guidance:  Assess patient's thoughts about quality of life, goals and expectations, and dissatisfaction or desire to improve.  Identify issues of primary importance such as mental health, illness, exercise tolerance, pain, sexual function and intimacy, cognitive change, social isolation, finances and relationships.  Assess and monitor for signs/symptoms of psychosocial concerns, especially depression or ideations regarding harm to others or self; provide or refer for mental health services as needed.  Identify sensory issues that impact quality of life such as hearing loss, vision deficit; strategize ways to maintain or improve hearing, vision.  Promote access to services in the community to support independence such as support groups, home visiting programs, financial  assistance, handicapped parking tags, durable medical equipment and emergency responder.  Promote activities to decrease social isolation such as group support or social, leisure and recreational activities, employment, use of social media; consider safety concerns about being out of home for activities.  Provide patient an opportunity to share by storytelling or a "life review" to give positive meaning to life and to assist with coping and negative experiences.  Encourage patient to tap into hope to improve sense of self.  Counsel based on prognosis and as early as possible about end-of-life and palliative care; consider referral to palliative care provider.  Advocate for the development of palliative care plan that may include avoidance of unnecessary testing and intervention, symptom control, discontinuation of medications, hospice and organ donation.  Counsel as early as possible those with life-limiting chronic disease about palliative care; consider referral to palliative care provider.  Advocate for the development of palliative care plan.   Notes:   Timeframe:  Long-Range Goal Priority:  Medium  Start Date:  10/18/20                         Expected End Date:  01/15/21                    Follow Up Date- 12/13/20  Current Barriers:  Chronic Mental Health needs related to Depression and Grief Financial constraints related to managing health care expenses Limited social support ADL IADL limitations Mental Health Concerns  Limited access to caregiver Cognitive Deficits Memory Deficits Inability to perform ADL's independently Inability to perform IADL's independently Suicidal Ideation/Homicidal Ideation: No  Clinical Social Work Goal(s):  Over the next 120 days, patient will work with SW bi-monthly by telephone or in person to reduce or manage symptoms related to depression, grief, anxiety and ongoing stressors Over the next 120 days, patient will demonstrate improved health  management independence as evidenced by implementing positive self-care into her daily routines  Interventions: Patient interviewed and appropriate assessments performed: brief mental health assessment LCSW completed call to patient's caregiver/granddaughter [name] and provided family with personal care service resource education Patient was deemed competent by APS after LCSW filed cased back in August of 2021. Granddaughter [name] reports that patient often isolates where she resides now and she feels that patient would thrive more if she were to relocate near her and her family in order to gain socialization. [name] reports living in  [place] area. Family is wanting C3 referral for low income housing resources for patient to relocate there. Resource education has been provided to family by C3 Guide but no relocation has been completed. C3 team is now currently involved with patient for financial support. Granddaughter reports that patient is stable but was upset with her because she went out of town this weekend to visit her in law's and was not able to take her. CCM LCSW provided caregiver with stress management coping skills to help decrease caregiver burn out and enforce healthy boundaries.  Provided patient with information about available mental health resources within the nearby area Discussed plans with patient for ongoing care management follow up and provided patient with direct contact information for care management team Advised patient to contact CCM LCSW directly if case management needs arise Collaborated with primary care provider re: Patient has new small dog that is helping her cope with the loss of her last one but granddaughter reports concern around her ability to take appropriate care of this dog. LCSW reported this concern to APS previously.  Assisted patient/caregiver with obtaining information about health plan benefits Provided education and  assistance to client regarding Advanced Directives. A voluntary and extensive discussion about advanced care planning including explanation and discussion of advanced was undertaken with the patient and [name] (caretaker). Explanation regarding healthcare proxy and living will was reviewed and packet with forms with explanation of how to fill them out was given.   Provided education to patient/caregiver regarding level of care options. Provided education to patient/caregiver about Hospice and/or Palliative Care services The granddaughter feels the patient is not taking her medications as directed. The granddaughter does not feel like the patient needs to be driving but patient continues to do so. The patient sometimes avoids talking to granddaughter because of ongoing conflict with granddaughter being concerned about her current level of care.  The granddaughter is concerned about the patient driving. She feels if she is taking her medications as directed that it is not an issue; however if she goes out driving in a rage she is concerned that she will hurt herself or someone else.   The patients granddaughter reports ongoing concern for patient's ability to manager her own finances. Patient was previously put out of $1000 due to scam calls.  Encouraged family to consider [facility] for grief follow up and therapy/counseling [employer]ed [employer]ies education provided to family LCSW filed APS report on patient with [facility] on 04/21/20 and included PCP, sister, granddaughter and CCM Nurse on report. APS found patient to be competent so although patient is not making the best decisions regarding her care, she has the right do so. Granddaughter shares that patient is stable for now. Patient received recent socialization at a family's birthday party which elevated her mood.    Patient Self Care Activities:  Attends all scheduled provider appointments Strong family or social support  Patient  Coping Strengths:  Family Hopefulness  Patient Self Care Deficits:  Lacks social connections Unable to perform ADLs independently Unable to perform IADLs independently  Please see past updates related to this goal by clicking on the "Past Updates" button in the selected goal      Task: Support and Maintain Acceptable Degree of Health, Comfort and Happiness   Note:   Care Management Activities:    - affirmation provided - community involvement promoted - expression of thoughts about present/future encouraged - independence in all possible areas promoted - life review by storytelling  encouraged - patient strengths promoted - psychosocial concerns monitored - self-expression encouraged - sleep diary encouraged - sleep hygiene techniques encouraged - social relationships promoted - strategies to maintain hearing and/or vision promoted - strategies to maintain intimacy promoted - wellness behaviors promoted    Notes:       Patient verbalizes understanding of instructions provided today and agrees to view in MyChart.   Telephone follow up appointment with care management team member scheduled for: 05-18-2021 at 0900 am  Alto Denver RN, MSN, CCM Community Care Coordinator [place]  [facility] Mobile: [phone]

## 2021-03-02 ENCOUNTER — Telehealth: Payer: Self-pay

## 2021-03-02 NOTE — Telephone Encounter (Signed)
Called and LVM asking for Marcelino Duster to please re-fax paperwork to [place] as I do not see it. Provided the fax number to her.

## 2021-03-02 NOTE — Telephone Encounter (Signed)
Copied from CRM [id number]. Topic: General - Other >> Mar 02, 2021 12:45 PM Gaetana Michaelis A wrote: Reason for CRM: Marcelino Duster with [employer] has called regarding paperwork submitted on 02/22/21  Renewal paperowrk needed for patient's incontinent supplies as well as a CMN and MEO were submitted via fax  Please contact to update on the status of paperwork when possible

## 2021-05-18 ENCOUNTER — Telehealth: Payer: Self-pay

## 2021-05-18 ENCOUNTER — Telehealth: Payer: Medicaid Other

## 2021-05-18 NOTE — Telephone Encounter (Signed)
  Care Management   Follow Up Note   05/18/2021 Name: Renee Bridges MRN: [id number] DOB: Feb 08, 1946   Referred by: [name], DO Reason for referral : Chronic Care Management (RNCM: Follow up for Chronic Disease Management and Care Coordination Needs )   An unsuccessful telephone outreach was attempted today. The patient was referred to the case management team for assistance with care management and care coordination.   Follow Up Plan: The care management team will reach out to the patient again over the next 30 days.   Alto Denver RN, MSN, CCM Community Care Coordinator [place]  [facility] Mobile: [phone]

## 2021-05-18 NOTE — Telephone Encounter (Signed)
An unsuccessful telephone call to patient today. She is due for her Annual Wellness Visit via telephone. Voicemail was not set up to leave a message.

## 2021-05-25 ENCOUNTER — Telehealth: Payer: Self-pay | Admitting: Family Medicine

## 2021-05-25 NOTE — Telephone Encounter (Signed)
Copied from CRM [id number]. Topic: Medicare AWV >> May 25, 2021  2:27 PM Leigh Aurora wrote: Reason for CRM:  Left message for patient to call back and schedule Medicare Annual Wellness Visit (AWV) to be done virtually or by telephone.  No hx of AWV eligible as of 01/12/21  Please schedule at anytime with CFP-Nurse Health Advisor.      45 Minutes appointment   Any questions, please call me at [phone]

## 2021-06-24 ENCOUNTER — Telehealth: Payer: Medicare Other | Admitting: General Practice

## 2021-06-24 ENCOUNTER — Ambulatory Visit (INDEPENDENT_AMBULATORY_CARE_PROVIDER_SITE_OTHER): Payer: Medicare Other

## 2021-06-24 DIAGNOSIS — E782 Mixed hyperlipidemia: Secondary | ICD-10-CM

## 2021-06-24 DIAGNOSIS — F339 Major depressive disorder, recurrent, unspecified: Secondary | ICD-10-CM

## 2021-06-24 DIAGNOSIS — F419 Anxiety disorder, unspecified: Secondary | ICD-10-CM

## 2021-06-24 NOTE — Chronic Care Management (AMB) (Signed)
Chronic Care Management   CCM RN Visit Note  06/24/2021 Name: Renee Bridges MRN: [id number] DOB: 1946-02-05  Subjective: Renee Bridges is a 75 y.o. year old female who is a primary care patient of [name], DO. The care management team was consulted for assistance with disease management and care coordination needs.    Engaged with patient by telephone for follow up visit in response to provider referral for case management and/or care coordination services.   Consent to Services:  The patient was given information about Chronic Care Management services, agreed to services, and gave verbal consent prior to initiation of services.  Please see initial visit note for detailed documentation.   Patient agreed to services and verbal consent obtained.   Assessment: Review of patient past medical history, allergies, medications, health status, including review of consultants reports, laboratory and other test data, was performed as part of comprehensive evaluation and provision of chronic care management services.   SDOH (Social Determinants of Health) assessments and interventions performed:  SDOH Interventions    Flowsheet Row Most Recent Value  SDOH Interventions   Food Insecurity Interventions Intervention Not Indicated  Financial Strain Interventions Other (Comment)  [fixed income, granddaughter helps her with management of her bills]  Housing Interventions Intervention Not Indicated  Intimate Partner Violence Interventions Intervention Not Indicated  Physical Activity Interventions Other (Comments)  [no structured activity]  Stress Interventions Intervention Not Indicated  Social Connections Interventions Other (Comment)  [Has support from her family. Gets lonely, support given]  Transportation Interventions Other (Comment)  [patient has resources and most of the time her granddaughter arranges her schedule so she can take the patient to appointments]        CCM Care  Plan  Allergies  Allergen Reactions   Bactrim [Sulfamethoxazole-Trimethoprim] Other (See Comments)    Sweating, feeling like throat is closing up   Pollen Extract    Codeine Rash    Outpatient Encounter Medications as of 06/24/2021  Medication Sig   atorvastatin (LIPITOR) 20 MG tablet Take 1 tablet (20 mg total) by mouth daily.   QUEtiapine (SEROQUEL XR) 50 MG TB24 24 hr tablet Take 1 tablet (50 mg total) by mouth at bedtime.   No facility-administered encounter medications on file as of 06/24/2021.    Patient Active Problem List   Diagnosis Date Noted   Driving safety issue 59/45/8592   Hyperlipidemia 04/03/2018   Mental status, decreased 12/28/2017   Depression, recurrent (HCC) 10/16/2017   Advanced care planning/counseling discussion 06/01/2017   Chronic kidney disease, stage 3 (HCC) 03/21/2017   Chronic back pain 09/25/2016   Cyst of right kidney 07/17/2016   Benign hypertensive renal disease 12/27/2015   Failed total left knee replacement (HCC) 05/13/2015   Anxiety 03/25/2015   Urge incontinence 06/30/2013    Conditions to be addressed/monitored:HLD, Anxiety, and Depression  Care Plan : RNCM: Depression (Adult)  Updates made by Marlowe Sax, RN since 06/24/2021 12:00 AM  Completed 06/24/2021   Problem: RNCM: Depression Identification (Depression) Resolved 06/24/2021  Priority: Medium     Goal: RNCM: Depressive Symptoms Identified Completed 06/24/2021  Priority: Medium  Note:   Current Barriers: Resolving, duplicate goal Knowledge Deficits related to effective management of depression and anxiety  Care Coordination needs related to cost constraints for paying for occupational therapy evaluation  in a patient with the need to have a safe driving evaluation. 02-23-2021: This has been resolved. The patient has decided not to get her drivers license renewed and she  has sold her Zenaida Niece.  Chronic Disease Management support and education needs related to chronic  conditions of depression and anxiety.  Lacks caregiver support.  Corporate treasurer.  Non-adherence to scheduled provider appointments Non-adherence to prescribed medication regimen Unable to independently manage exacerbations in depression and anxiety  Does not attend all scheduled provider appointments Does not adhere to prescribed medication regimen Lacks social connections Does not contact provider office for questions/concerns  Nurse Case Manager Clinical Goal(s):  Over the next 120 days, patient will verbalize understanding of plan for effective management of depression and anxiety  Over the next 120 days, patient will work with RNCM and pcp to address needs related to depression and anxiety  Over the next 120 days, patient will demonstrate a decrease in anxiety and depression  exacerbations as evidenced by normalized mood and no exacerbations of depression and anxiety  Over the next 120 days, patient will demonstrate improved health management independence as evidenced bysablized mood, no changes in current state of depression or anxiety   Interventions:  1:1 collaboration with [name], DO regarding development and update of comprehensive plan of care as evidenced by provider attestation and co-signature Inter-disciplinary care team collaboration (see longitudinal plan of care) Evaluation of current treatment plan related to depression and anxiety  and patient's adherence to plan as established by provider. 02-23-2021: The patients granddaughter, [name] states the patient is doing well currently. The patient has sold her Zenaida Niece and is not going to get her license renewed. The patient does not have any issues that are causing increased depression or anxiety at this time.   Advised patient to call the office for changes in mood/anxiety/depression Provided education to patient re: related to safe driving and help with resources to pay for an occupational health evaluation to see if  the patient is safe to drive. The patients granddaughter states that the patient now wants to sell her Zenaida Niece and get an ID. She was concerned that she would not have an ID and that is why she wanted to keep her license. Have done a care guide referral to see if there are resources to help with cost of occupational therapy needs. 02-23-2021: The patient is no longer actively wanting to get her license renewed or drive. She is taking the bus when needed and her granddaughter takes her to the grocery store to get the things she needs. Currently things are going well for the patient.  Reviewed medications with patient and discussed compliance. The granddaughter does not know if she is taking her medications as directed but states she is doing well at this time. 02-23-2021: The granddaughter feels the patient is taking her medications correctly at this time.  Care Guide referral for resources in the community to help with cost of occupational health. 02-23-2021- this has been completed.  Discussed plans with patient for ongoing care management follow up and provided patient with direct contact information for care management team Collaboration with Dr. Laural Benes and the clinical staff. The patients granddaughter states the patients shower chair is broken and she needs a new chair. Will ask for new script to get a new shower chair for the patient. Review of safety in the home with [name].   Patient Goals/Self-Care Activities Over the next 120 days, patient will:  - Patient will self administer medications as prescribed Patient will attend all scheduled provider appointments Patient will call pharmacy for medication refills Patient will attend church or other social activities Patient will continue to perform IADL's  independently Patient will call provider office for new concerns or questions Patient will work with BSW to address care coordination needs and will continue to work with the clinical team to address  health care and disease management related needs.   - anxiety screen reviewed - depression screen reviewed - medication list reviewed  Follow Up Plan: Telephone follow up appointment with care management team member scheduled for: 05-18-2021 at 0900 am        Task: RNCM: Identify Depressive Symptoms and Facilitate Treatment Completed 06/24/2021  Outcome: Positive  Note:   Care Management Activities:    - anxiety screen reviewed - depression screen reviewed - medication list reviewed        Care Plan : RNCM: HLD Management  Updates made by Marlowe Sax, RN since 06/24/2021 12:00 AM  Completed 06/24/2021   Problem: RNCM: HLD Management Resolved 06/24/2021  Priority: Medium     Long-Range Goal: RNCM: HLD Management Completed 06/24/2021  Priority: Medium  Note:   Current Barriers: resolving, duplicate goal Poorly controlled hyperlipidemia, complicated by depression, anxiety, non-compliance at times Current antihyperlipidemic regimen: atorvastatin 20 mg qd- not always compliant Most recent lipid panel:     Component Value Date/Time   CHOL 208 (H) 10/04/2020 1139   TRIG 146 10/04/2020 1139   HDL 41 10/04/2020 1139   LDLCALC 141 (H) 10/04/2020 1139   ASCVD risk enhancing conditions: age >34, HTN, CKD Unable to independently manage HLD  Unable to self administer medications as prescribed Does not adhere to prescribed medication regimen Lacks social connections Does not contact provider office for questions/concerns  RN Care Manager Clinical Goal(s):  Over the next 120 days, patient will work with Medical illustrator, providers, and care team towards execution of optimized self-health management plan Over the next 120 days, patient will verbalize understanding of plan for effective management of HLD Over the next 120 days, patient will work with [facility] and pcp to address needs related to HLD  Interventions: Collaboration with [name], DO regarding development and  update of comprehensive plan of care as evidenced by provider attestation and co-signature Inter-disciplinary care team collaboration (see longitudinal plan of care) Medication review performed; medication list updated in electronic medical record.  Inter-disciplinary care team collaboration (see longitudinal plan of care) Referred to pharmacy team for assistance with HLD medication management Evaluation of current treatment plan related to HLD  and patient's adherence to plan as established by provider. 02-23-2021: The patient is doing well and no issues at this time.  Advised patient to call the office for changes in condition or questions.   Provided education to patient re: heart healthy diet and life style modifications. 02-23-2021: The patient is eating well and denies any issues with heart  healthy diet.  Reviewed medications with patient and discussed compliance. The granddaughter is not sure if the patient is taking medications as directed however the patient is stable at this time. Discussed plans with patient for ongoing care management follow up and provided patient with direct contact information for care management team   Patient Goals/Self-Care Activities: Over the next 120 days, patient will:   - call for medicine refill 2 or 3 days before it runs out - call if I am sick and can't take my medicine - keep a list of all the medicines I take; vitamins and herbals too - learn to read medicine labels - use a pillbox to sort medicine - use an alarm clock or phone to remind me to  take my medicine - drink 6 to 8 glasses of water each day - fill half the plate with nonstarchy vegetables - limit fast food meals to no more than 1 per week - prepare main meal at home 3 to 5 days each week - read food labels for fat, fiber, carbohydrates and portion size - be open to making changes - I can manage, know and watch for signs of a heart attack - if I have chest pain, call for help - learn  about small changes that will make a big difference - learn my personal risk factors - administration or use of medication demonstrated - barriers to medication adherence identified - medication list compiled - medication list reviewed - medication-adherence assessment completed - medication reminder use encouraged - self-management plan initiated or updated - strategies for improving adherence encouraged - understanding of current medications assessed   Follow Up Plan: Telephone follow up appointment with care management team member scheduled for: 05-18-2021 at 0900 am      Task: RNCM: Optimize Medication Use Completed 06/24/2021  Outcome: Positive  Note:   Care Management Activities:    - administration or use of medication demonstrated - barriers to medication adherence identified - medication list compiled - medication list reviewed - medication-adherence assessment completed - medication reminder use encouraged - self-management plan initiated or updated - strategies for improving adherence encouraged - understanding of current medications assessed        Care Plan : RNCM: General Plan of Care (Adult) for Chronic Disease Management and Care Coordination Needs  Updates made by Marlowe Sax, RN since 06/24/2021 12:00 AM     Problem: RNCM: Development of plan of care for Chronic Disease Management and Care Coordination Needs (anxiety, depression, HLD)   Priority: High     Long-Range Goal: Effective management and support for Chronic Diseases (Anxiety, Depression, HLD)   Start Date: 06/24/2021  Expected End Date: 06/24/2022  Priority: High  Note:   Current Barriers:  Knowledge Deficits related to plan of care for management of HLD and Anxiety with Excessive Worry, Social Anxiety, and Depression: depressed mood anxiety  Care Coordination needs related to Limited social support, Transportation, and Mental Health Concerns   Chronic Disease Management support and  education needs related to HLD and Anxiety with Excessive Worry, Social Anxiety, and Depression: depressed mood anxiety Lacks caregiver support.  Transportation barriers  RNCM Clinical Goal(s):  Patient will verbalize understanding of plan for management of HLD, Anxiety, and Depression  verbalize basic understanding of HLD, Anxiety, and Depression disease process and self health management plan   take all medications exactly as prescribed and will call provider for medication related questions demonstrate improved and ongoing adherence to prescribed treatment plan for HLD, Anxiety, and Depression as evidenced by adherence to prescribed medication regimen contacting provider for new or worsened symptoms or questions   demonstrate improved and ongoing health management independence   continue to work with Medical illustrator and/or Social Worker to address care management and care coordination needs related to HLD, Anxiety, and Depression  demonstrate a decrease in HLD, Anxiety, and Depression exacerbations   demonstrate ongoing self health care management ability for effective management of chronic diseases  through collaboration with RN Care manager, provider, and care team.   Interventions: 1:1 collaboration with primary care provider regarding development and update of comprehensive plan of care as evidenced by provider attestation and co-signature Inter-disciplinary care team collaboration (see longitudinal plan of care) Evaluation of current treatment plan  related to  self management and patient's adherence to plan as established by provider   SDOH Barriers (Status: Goal on track: YES.)  Patient interviewed and SDOH assessment performed        SDOH Interventions    Flowsheet Row Most Recent Value  SDOH Interventions   Food Insecurity Interventions Intervention Not Indicated  Financial Strain Interventions Other (Comment)  [fixed income, granddaughter helps her with management of her  bills]  Housing Interventions Intervention Not Indicated  Intimate Partner Violence Interventions Intervention Not Indicated  Physical Activity Interventions Other (Comments)  [no structured activity]  Stress Interventions Intervention Not Indicated  Social Connections Interventions Other (Comment)  [Has support from her family. Gets lonely, support given]  Transportation Interventions Other (Comment)  [patient has resources and most of the time her granddaughter arranges her schedule so she can take the patient to appointments]     Provided patient with information about resources to help with needs in the community  Discussed plans with patient for ongoing care management follow up and provided patient with direct contact information for care management team Advised patient to call the office for changes in SDOH, questions, or needs    Anxiety and Depression   (Status: Goal on track: YES.) Evaluation of current treatment plan related to Anxiety and Depression, Limited social support, Transportation, and Mental Health Concerns  self-management and patient's adherence to plan as established by provider. Discussed plans with patient for ongoing care management follow up and provided patient with direct contact information for care management team Advised patient to call the office for changes in mood, anxiety, depression; Provided education to patient re: effective management of depression and anxiety, working with the CCM team to meet mental health needs, doing things the patient enjoys doing, talk to others about how she is feeling when she is down; Reviewed medications with patient and discussed compliance. The patient is not always compliant with medications. Her granddaughter encourages her to take medications on a conistent basis. ; Discussed plans with patient for ongoing care management follow up and provided patient with direct contact information for care management  team;  Hyperlipidemia:  (Status: Goal on track: NO.) Lab Results  Component Value Date   CHOL 208 (H) 10/04/2020   HDL 41 10/04/2020   LDLCALC 141 (H) 10/04/2020   TRIG 146 10/04/2020     Medication review performed; medication list updated in electronic medical record.  Provider established cholesterol goals reviewed; Counseled on importance of regular laboratory monitoring as prescribed; Provided HLD educational materials; Reviewed role and benefits of statin for ASCVD risk reduction; Discussed strategies to manage statin-induced myalgias; Reviewed importance of limiting foods high in cholesterol;  Patient Goals/Self-Care Activities: Patient will self administer medications as prescribed as evidenced by self report/primary caregiver report  Patient will attend all scheduled provider appointments as evidenced by clinician review of documented attendance to scheduled appointments and patient/caregiver report Patient will call pharmacy for medication refills as evidenced by patient report and review of pharmacy fill history as appropriate Patient will attend church or other social activities as evidenced by patient report Patient will continue to perform ADL's independently as evidenced by patient/caregiver report Patient will continue to perform IADL's independently as evidenced by patient/caregiver report Patient will call provider office for new concerns or questions as evidenced by review of documented incoming telephone call notes and patient report Patient will work with BSW to address care coordination needs and will continue to work with the clinical team to address health care  and disease management related needs as evidenced by documented adherence to scheduled care management/care coordination appointments - call for medicine refill 2 or 3 days before it runs out - take all medications exactly as prescribed - call doctor with any symptoms you believe are related to your  medicine - call doctor when you experience any new symptoms - go to all doctor appointments as scheduled - adhere to prescribed diet: heart healthy        Plan:Telephone follow up appointment with care management team member scheduled for:  08-31-2021 at 1 pm  Alto Denver RN, MSN, CCM Community Care Coordinator [place]  [facility] Mobile: [phone]

## 2021-06-24 NOTE — Patient Instructions (Signed)
Visit Information  PATIENT GOALS:  Goals Addressed             This Visit's Progress    RNCM: Track and Manage My Symptoms-Depression       Timeframe:  Long-Range Goal Priority:  Medium Start Date:                             Expected End Date:       06-23-2022          Follow Up Date 08-31-2021   - avoid negative self-talk - develop a personal safety plan - develop a plan to deal with triggers like holidays, anniversaries - have a plan for how to handle bad days - spend time or talk with others at least 2 to 3 times per week - spend time or talk with others every day - watch for early signs of feeling worse    Why is this important?   Keeping track of your progress will help your treatment team find the right mix of medicine and therapy for you.  Write in your journal every day.  Day-to-day changes in depression symptoms are normal. It may be more helpful to check your progress at the end of each week instead of every day.     02-23-2021: The patient is doing well and there is no issues with active anxiety or depression at this time. She has decided to sell her Zenaida Niece and not get her drivers license renewed. 06-24-2021: Spoke with the patients granddaughter and she is doing good right now. Does have a cold. She is a little upset that she will not be able to go to one of her grandchildren's birthday party this weekend. [name] states she gets lonely but she is doing pretty good right now. Will continue to monitor.         Patient verbalizes understanding of instructions provided today and agrees to view in MyChart.   Telephone follow up appointment with care management team member scheduled for: 08-31-2021 at 1 pm  Alto Denver RN, MSN, CCM Community Care Coordinator [place]  [facility] Mobile: [phone]

## 2021-07-04 DIAGNOSIS — E782 Mixed hyperlipidemia: Secondary | ICD-10-CM

## 2021-07-04 DIAGNOSIS — F339 Major depressive disorder, recurrent, unspecified: Secondary | ICD-10-CM | POA: Diagnosis not present

## 2021-08-03 ENCOUNTER — Telehealth: Payer: Self-pay | Admitting: Family Medicine

## 2021-08-03 NOTE — Progress Notes (Signed)
BP 131/86   Pulse 70   Temp (!) 97 F (36.1 C) (Oral)   Wt 176 lb (79.8 kg)   LMP  (LMP Unknown)   SpO2 97%   BMI 33.25 kg/m    Subjective:    Patient ID: Renee Bridges, female    DOB: [birth year], 75 y.o.   MRN: [id number]  HPI: Renee Bridges is a 75 y.o. female  Chief Complaint  Patient presents with   Dysuria   Polyuria    Pt has had lower abdominal pain with painful urination. Along with urgency for about 1 week. Sometimes having some incontinence.      URINARY SYMPTOMS A couple days after thanksgiving symptoms started. Dysuria: yes Urinary frequency: yes Urgency: yes Small volume voids: yes Symptom severity: yes Urinary incontinence: yes Foul odor: yes Hematuria: no Abdominal pain: no Back pain: no Suprapubic pain/pressure: no Flank pain: no Fever:  no Vomiting: no Relief with cranberry juice: no Relief with pyridium: no Status: worse Previous urinary tract infection: no Recurrent urinary tract infection: no Treatments attempted:  tylenol and water     Relevant past medical, surgical, family and social history reviewed and updated as indicated. Interim medical history since our last visit reviewed. Allergies and medications reviewed and updated.  Review of Systems  Constitutional:  Negative for fever.  Gastrointestinal:  Negative for abdominal pain and vomiting.  Genitourinary:  Positive for dysuria and frequency. Negative for decreased urine volume, flank pain, hematuria and urgency.  Musculoskeletal:  Negative for back pain.   Per HPI unless specifically indicated above     Objective:    BP 131/86   Pulse 70   Temp (!) 97 F (36.1 C) (Oral)   Wt 176 lb (79.8 kg)   LMP  (LMP Unknown)   SpO2 97%   BMI 33.25 kg/m   Wt Readings from Last 3 Encounters:  08/04/21 176 lb (79.8 kg)  10/04/20 162 lb (73.5 kg)  07/13/20 163 lb (73.9 kg)    Physical Exam Vitals and nursing note reviewed.  Constitutional:      General: She is not in acute  distress.    Appearance: Normal appearance. She is normal weight. She is not ill-appearing, toxic-appearing or diaphoretic.  HENT:     Head: Normocephalic.     Right Ear: External ear normal.     Left Ear: External ear normal.     Nose: Nose normal.     Mouth/Throat:     Mouth: Mucous membranes are moist.     Pharynx: Oropharynx is clear.  Eyes:     General:        Right eye: No discharge.        Left eye: No discharge.     Extraocular Movements: Extraocular movements intact.     Conjunctiva/sclera: Conjunctivae normal.     Pupils: Pupils are equal, round, and reactive to light.  Cardiovascular:     Rate and Rhythm: Normal rate and regular rhythm.     Heart sounds: No murmur heard. Pulmonary:     Effort: Pulmonary effort is normal. No respiratory distress.     Breath sounds: Normal breath sounds. No wheezing or rales.  Abdominal:     General: Abdomen is flat. Bowel sounds are normal. There is no distension.     Palpations: Abdomen is soft.     Tenderness: There is abdominal tenderness. There is no right CVA tenderness, left CVA tenderness or guarding.  Musculoskeletal:     Cervical back:  Normal range of motion and neck supple.  Skin:    General: Skin is warm and dry.     Capillary Refill: Capillary refill takes less than 2 seconds.  Neurological:     General: No focal deficit present.     Mental Status: She is alert and oriented to person, place, and time. Mental status is at baseline.  Psychiatric:        Mood and Affect: Mood normal.        Behavior: Behavior normal.        Thought Content: Thought content normal.        Judgment: Judgment normal.    Results for orders placed or performed in visit on 10/04/20  Microscopic Examination   Urine  Result Value Ref Range   WBC, UA 0-5 0 - 5 /hpf   RBC 0-2 0 - 2 /hpf   Epithelial Cells (non renal) 0-10 0 - 10 /hpf   Bacteria, UA None seen None seen/Few  Lipid Panel w/o Chol/HDL Ratio  Result Value Ref Range    Cholesterol, Total 208 (H) 100 - 199 mg/dL   Triglycerides 146 0 - 149 mg/dL   HDL 41 >39 mg/dL   VLDL Cholesterol Cal 26 5 - 40 mg/dL   LDL Chol Calc (NIH) 141 (H) 0 - 99 mg/dL  Comprehensive metabolic panel  Result Value Ref Range   Glucose 96 65 - 99 mg/dL   BUN 9 8 - 27 mg/dL   Creatinine, Ser 1.09 (H) 0.57 - 1.00 mg/dL   GFR calc non Af Amer 49 (L) >59 mL/min/1.73   GFR calc Af Amer 57 (L) >59 mL/min/1.73   BUN/Creatinine Ratio 8 (L) 12 - 28   Sodium 138 134 - 144 mmol/L   Potassium 4.4 3.5 - 5.2 mmol/L   Chloride 101 96 - 106 mmol/L   CO2 24 20 - 29 mmol/L   Calcium 9.5 8.7 - 10.3 mg/dL   Total Protein 6.7 6.0 - 8.5 g/dL   Albumin 4.3 3.7 - 4.7 g/dL   Globulin, Total 2.4 1.5 - 4.5 g/dL   Albumin/Globulin Ratio 1.8 1.2 - 2.2   Bilirubin Total 0.5 0.0 - 1.2 mg/dL   Alkaline Phosphatase 80 44 - 121 IU/L   AST 25 0 - 40 IU/L   ALT 17 0 - 32 IU/L  CBC with Differential/Platelet  Result Value Ref Range   WBC 6.8 3.4 - 10.8 x10E3/uL   RBC 4.92 3.77 - 5.28 x10E6/uL   Hemoglobin 15.0 11.1 - 15.9 g/dL   Hematocrit 46.7 (H) 34.0 - 46.6 %   MCV 95 79 - 97 fL   MCH 30.5 26.6 - 33.0 pg   MCHC 32.1 31.5 - 35.7 g/dL   RDW 11.8 11.7 - 15.4 %   Platelets 219 150 - 450 x10E3/uL   Neutrophils 53 Not Estab. %   Lymphs 36 Not Estab. %   Monocytes 8 Not Estab. %   Eos 2 Not Estab. %   Basos 1 Not Estab. %   Neutrophils Absolute 3.6 1.4 - 7.0 x10E3/uL   Lymphocytes Absolute 2.4 0.7 - 3.1 x10E3/uL   Monocytes Absolute 0.6 0.1 - 0.9 x10E3/uL   EOS (ABSOLUTE) 0.1 0.0 - 0.4 x10E3/uL   Basophils Absolute 0.0 0.0 - 0.2 x10E3/uL   Immature Granulocytes 0 Not Estab. %   Immature Grans (Abs) 0.0 0.0 - 0.1 x10E3/uL  Urinalysis, Routine w reflex microscopic  Result Value Ref Range   Specific Gravity, UA 1.010 1.005 - 1.030  pH, UA 7.0 5.0 - 7.5   Color, UA Yellow Yellow   Appearance Ur Clear Clear   Leukocytes,UA Trace (A) Negative   Protein,UA Negative Negative/Trace   Glucose, UA  Negative Negative   Ketones, UA Negative Negative   RBC, UA Trace (A) Negative   Bilirubin, UA Negative Negative   Urobilinogen, Ur 0.2 0.2 - 1.0 mg/dL   Nitrite, UA Negative Negative   Microscopic Examination See below:   Microalbumin, Urine Waived  Result Value Ref Range   Microalb, Ur Waived 10 0 - 19 mg/L   Creatinine, Urine Waived 50 10 - 300 mg/dL   Microalb/Creat Ratio <30 <30 mg/g      Assessment & Plan:   Problem List Items Addressed This Visit   None Visit Diagnoses     Acute cystitis with hematuria    -  Primary   UA + for leuks and blood.  Will treat with Macrobid. Will send for culture. Increase water intake and avoid caffiene. Will change antibiotic if necessary.   Relevant Orders   Urine Culture   Acute low back pain, unspecified back pain laterality, unspecified whether sciatica present       Relevant Orders   Urinalysis, Routine w reflex microscopic        Follow up plan: Return if symptoms worsen or fail to improve.

## 2021-08-03 NOTE — Telephone Encounter (Signed)
I can work the patient in.  The note below says she needs a PA prior to appt though?

## 2021-08-03 NOTE — Telephone Encounter (Signed)
Copied from CRM [id number]. Topic: Appointment Scheduling - Prior Auth Required for Appointment >> Aug 03, 2021 10:50 AM Louie Bun, Rosey Bath D wrote: No appointment has been scheduled. Patient is requesting same day work-in appointment for possible kidney pain for a couple days. Per scheduling protocol, this appointment requires a prior authorization prior to scheduling.  Route to department's PEC pool.

## 2021-08-03 NOTE — Telephone Encounter (Signed)
Spoke with patient's granddaughter. Appointment scheduled for tomorrow per request by granddaughter.

## 2021-08-03 NOTE — Telephone Encounter (Signed)
Can anyone in office this afternoon see this patient? Or schedule for tomorrow?

## 2021-08-04 ENCOUNTER — Encounter: Payer: Self-pay | Admitting: Nurse Practitioner

## 2021-08-04 ENCOUNTER — Other Ambulatory Visit: Payer: Self-pay

## 2021-08-04 ENCOUNTER — Ambulatory Visit (INDEPENDENT_AMBULATORY_CARE_PROVIDER_SITE_OTHER): Payer: Medicare Other | Admitting: Nurse Practitioner

## 2021-08-04 VITALS — BP 131/86 | HR 70 | Temp 97.0°F | Wt 176.0 lb

## 2021-08-04 DIAGNOSIS — N3001 Acute cystitis with hematuria: Secondary | ICD-10-CM

## 2021-08-04 DIAGNOSIS — M545 Low back pain, unspecified: Secondary | ICD-10-CM

## 2021-08-04 LAB — URINALYSIS, ROUTINE W REFLEX MICROSCOPIC
Bilirubin, UA: NEGATIVE
Glucose, UA: NEGATIVE
Nitrite, UA: POSITIVE — AB
Specific Gravity, UA: 1.025 (ref 1.005–1.030)
Urobilinogen, Ur: 0.2 mg/dL (ref 0.2–1.0)
pH, UA: 5.5 (ref 5.0–7.5)

## 2021-08-04 LAB — MICROSCOPIC EXAMINATION

## 2021-08-04 MED ORDER — NITROFURANTOIN MONOHYD MACRO 100 MG PO CAPS: 100.0000 mg | ORAL_CAPSULE | Freq: Two times a day (BID) | ORAL | 0 refills | Status: DC

## 2021-08-04 NOTE — Progress Notes (Signed)
Results discussed with patient during visit. Treated with antibiotics and sent for culture.

## 2021-08-09 LAB — URINE CULTURE

## 2021-08-09 NOTE — Progress Notes (Signed)
Patient is on appropriate antibiotic.  No need to change at this time.

## 2021-08-31 ENCOUNTER — Telehealth: Payer: Self-pay | Admitting: Family Medicine

## 2021-08-31 ENCOUNTER — Ambulatory Visit (INDEPENDENT_AMBULATORY_CARE_PROVIDER_SITE_OTHER): Payer: Medicare Other

## 2021-08-31 ENCOUNTER — Telehealth: Payer: Medicare Other

## 2021-08-31 DIAGNOSIS — F339 Major depressive disorder, recurrent, unspecified: Secondary | ICD-10-CM

## 2021-08-31 DIAGNOSIS — E782 Mixed hyperlipidemia: Secondary | ICD-10-CM

## 2021-08-31 DIAGNOSIS — M25562 Pain in left knee: Secondary | ICD-10-CM

## 2021-08-31 DIAGNOSIS — F419 Anxiety disorder, unspecified: Secondary | ICD-10-CM

## 2021-08-31 NOTE — Telephone Encounter (Signed)
Routing to provider, order pended.

## 2021-08-31 NOTE — Patient Instructions (Signed)
Visit Information  Thank you for taking time to visit with me today. Please don't hesitate to contact me if I can be of assistance to you before our next scheduled telephone appointment.  Following are the goals we discussed today:  RNCM Clinical Goal(s):  Patient will verbalize understanding of plan for management of HLD, Anxiety, and Depression  verbalize basic understanding of HLD, Anxiety, Chronic pain, and Depression disease process and self health management plan  take all medications exactly as prescribed and will call provider for medication related questions demonstrate improved and ongoing adherence to prescribed treatment plan for HLD, Chronic pain, Anxiety, and Depression as evidenced by adherence to prescribed medication regimen contacting provider for new or worsened symptoms or questions  demonstrate improved and ongoing health management independence  continue to work with Medical illustrator and/or Social Worker to address care management and care coordination needs related to HLD, Anxiety, chronic pain, and Depression  demonstrate a decrease in HLD, Anxiety, Chronic pain, and Depression exacerbations  demonstrate ongoing self health care management ability for effective management of chronic diseases through collaboration with RN Care manager, provider, and care team.    Interventions: 1:1 collaboration with primary care provider regarding development and update of comprehensive plan of care as evidenced by provider attestation and co-signature Inter-disciplinary care team collaboration (see longitudinal plan of care) Evaluation of current treatment plan related to  self management and patient's adherence to plan as established by provider     SDOH Barriers (Status: Goal on track: YES.)  Patient interviewed and SDOH assessment performed        SDOH Interventions     Flowsheet Row Most Recent Value  SDOH Interventions    Food Insecurity Interventions Intervention Not  Indicated  Financial Strain Interventions Other (Comment)  [fixed income, granddaughter helps her with management of her bills]  Housing Interventions Intervention Not Indicated  Intimate Partner Violence Interventions Intervention Not Indicated  Physical Activity Interventions Other (Comments)  [no structured activity]  Stress Interventions Intervention Not Indicated  Social Connections Interventions Other (Comment)  [Has support from her family. Gets lonely, support given]  Transportation Interventions Other (Comment)  [patient has resources and most of the time her granddaughter arranges her schedule so she can take the patient to appointments]       Provided patient with information about resources to help with needs in the community  Discussed plans with patient for ongoing care management follow up and provided patient with direct contact information for care management team Advised patient to call the office for changes in SDOH, questions, or needs       Anxiety and Depression   (Status: Goal on track: YES.) Evaluation of current treatment plan related to Anxiety and Depression, Limited social support, Transportation, and Mental Health Concerns  self-management and patient's adherence to plan as established by provider. 08-31-2021: The patient is always sad this time of year because it is the month that her daughter died but the granddaughter states that she is doing fairly well and the family has spent time with her for Christmas. The granddaughter did mention that the patient is hoarding and having a lot of stuff in her house. She states she picks things up that other people put out that is trash. She states that she thinks she hurt herself because recently the patient got a big mirror from the curb that would go on a dresser and she can tell she is hurting and in pain. She is concerned about the condition  of the patients apartment. Education and support given.  Discussed plans with  patient for ongoing care management follow up and provided patient with direct contact information for care management team Advised patient to call the office for changes in mood, anxiety, depression; Provided education to patient re: effective management of depression and anxiety, working with the CCM team to meet mental health needs, doing things the patient enjoys doing, talk to others about how she is feeling when she is down; Reviewed medications with patient and discussed compliance. 08-31-2021:The patient is not always compliant with medications. Her granddaughter encourages her to take medications on a conistent basis. ; Discussed plans with patient for ongoing care management follow up and provided patient with direct contact information for care management team;   Hyperlipidemia:  (Status: Goal on track: NO.)      Lab Results  Component Value Date    CHOL 208 (H) 10/04/2020    HDL 41 10/04/2020    LDLCALC 141 (H) 10/04/2020    TRIG 146 10/04/2020      Medication review performed; medication list updated in electronic medical record.  Provider established cholesterol goals reviewed; Counseled on importance of regular laboratory monitoring as prescribed. 08-31-2021: Reminded the patients granddaughter to call the office to schedule an appointment for patient to be seen for evaluation of labs and new lab work possibly; Provided HLD educational materials; Reviewed role and benefits of statin for ASCVD risk reduction; Discussed strategies to manage statin-induced myalgias; Reviewed importance of limiting foods high in cholesterol. 08-31-2021: Review and education done     Pain:  (Status: Goal on track: NO.) Long Term Goal Added on 08-31-2021 Pain assessment performed. 08-31-2021: The patients granddaughter states the patient tells her that her knees are hurting. She is walking fine but she states when she is getting in and out of the granddaughters car or sitting that she is  complaining about her knees hurting. Unable to get a number of her pain level. The granddaughter thinks she hurt herself when she picked up a large Forensic scientist from the curb. She has noticed her grimacing when she is with her. Education on getting and appointment to see pcp or taking her to [facility] for after hours support. The granddaughter states she has several appointments coming up for her children and she would need to come in as late as possible to see the pcp. Education on the granddaughter calling the office and seeing if she could get a late appointment with Dr. Laural Benes.  Medications reviewed Reviewed provider established plan for pain management; Discussed importance of adherence to all scheduled medical appointments. 08-31-2021: Discussed calling the office and getting an appointment with the pcp for evaluation and recommendations of knee pain.  Counseled on the importance of reporting any/all new or changed pain symptoms or management strategies to pain management provider; Advised patient to report to care team affect of pain on daily activities; Discussed use of relaxation techniques and/or diversional activities to assist with pain reduction (distraction, imagery, relaxation, massage, acupressure, TENS, heat, and cold application; Reviewed with patient prescribed pharmacological and nonpharmacological pain relief strategies; Advised patient to discuss unresolved pain, changes in level or intensity of pain with provider; Screening for signs and symptoms of depression related to chronic disease state;  Assessed social determinant of health barriers;   Review of UTI sx and sx. The patients granddaughter states there has been resolution in UTI sx and sx   Patient Goals/Self-Care Activities: Patient will self administer medications as prescribed  as evidenced by self report/primary caregiver report  Patient will attend all scheduled provider appointments as evidenced by clinician  review of documented attendance to scheduled appointments and patient/caregiver report Patient will call pharmacy for medication refills as evidenced by patient report and review of pharmacy fill history as appropriate Patient will attend church or other social activities as evidenced by patient report Patient will continue to perform ADL's independently as evidenced by patient/caregiver report Patient will continue to perform IADL's independently as evidenced by patient/caregiver report Patient will call provider office for new concerns or questions as evidenced by review of documented incoming telephone call notes and patient report Patient will work with BSW to address care coordination needs and will continue to work with the clinical team to address health care and disease management related needs as evidenced by documented adherence to scheduled care management/care coordination appointments - call for medicine refill 2 or 3 days before it runs out - take all medications exactly as prescribed - call doctor with any symptoms you believe are related to your medicine - call doctor when you experience any new symptoms - go to all doctor appointments as scheduled - adhere to prescribed diet: heart healthy     Our next appointment is by telephone on 11-02-2021 at 1 pm  Please call the care guide team at [phone] if you need to cancel or reschedule your appointment.   If you are experiencing a Mental Health or Behavioral Health Crisis or need someone to talk to, please call the Suicide and Crisis Lifeline: 988 call the [place] National Suicide Prevention Lifeline: 713-265-4082 or TTY: 442-614-8407 TTY (928) 816-5387) to talk to a trained counselor call 1-800-273-TALK (toll free, 24 hour hotline)   Patient verbalizes understanding of instructions provided today and agrees to view in MyChart.   Alto Denver RN, MSN, CCM Community Care Coordinator [place]   [facility] Mobile: [phone]

## 2021-08-31 NOTE — Telephone Encounter (Signed)
Copied from CRM [id number]. Topic: General - Other >> Aug 31, 2021 11:32 AM Maye Hides wrote: Reason for CRM: Pt's granddaughter Brayton Caves is requesting an order for a shower chair. She states pt is having a lot of problems with her knee. It does not matter where order is sent but would like a call to advise when this has been done >> Aug 31, 2021 12:24 PM Street, Oliver L wrote: Pt's granddaughter states she can schedule an appt in [place] but cant bring her before then. Asks if Dr. Laural Benes can call her.
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
## 2021-09-01 NOTE — Telephone Encounter (Signed)
Patients granddaughter Brayton Caves called to let us know that all communication about the shower chair should be discussed with her.

## 2021-09-01 NOTE — Telephone Encounter (Signed)
done

## 2021-09-02 NOTE — Telephone Encounter (Signed)
Faxed over order and most recent OV to [facility].

## 2021-09-03 DIAGNOSIS — E782 Mixed hyperlipidemia: Secondary | ICD-10-CM | POA: Diagnosis not present

## 2021-09-03 DIAGNOSIS — F339 Major depressive disorder, recurrent, unspecified: Secondary | ICD-10-CM

## 2021-09-08 ENCOUNTER — Ambulatory Visit (INDEPENDENT_AMBULATORY_CARE_PROVIDER_SITE_OTHER): Payer: Commercial Managed Care - HMO | Admitting: *Deleted

## 2021-09-08 DIAGNOSIS — Z0000 Encounter for general adult medical examination without abnormal findings: Secondary | ICD-10-CM

## 2021-09-08 NOTE — Progress Notes (Signed)
Subjective:   Renee Bridges is a 76 y.o. female who presents for Medicare Annual (Subsequent) preventive examination.  I connected with  Renee MannDella S Los on 09/08/21 by a telephone enabled telemedicine application and verified that I am speaking with the correct person using two identifiers.   I discussed the limitations of evaluation and management by telemedicine. The patient expressed understanding and agreed to proceed.  Patient location: home  Provider location: Tele-Health not in clinic    Review of Systems     Cardiac Risk Factors include: advanced age (>6255men, 58>65 women);obesity (BMI >30kg/m2);sedentary lifestyle     Objective:    Today's Vitals   There is no height or weight on file to calculate BMI.  Advanced Directives 09/08/2021 01/12/2020 08/25/2017 05/04/2017 01/10/2017 05/13/2015 05/06/2015  Does Patient Have a Medical Advance Directive? No No No No No No No  Would patient like information on creating a medical advance directive? No - Patient declined Yes (MAU/Ambulatory/Procedural Areas - Information given) No - Patient declined Yes (MAU/Ambulatory/Procedural Areas - Information given) No - Patient declined No - patient declined information No - patient declined information    Current Medications (verified) Outpatient Encounter Medications as of 09/08/2021  Medication Sig   atorvastatin (LIPITOR) 20 MG tablet Take 1 tablet (20 mg total) by mouth daily.   nitrofurantoin, macrocrystal-monohydrate, (MACROBID) 100 MG capsule Take 1 capsule (100 mg total) by mouth 2 (two) times daily.   QUEtiapine (SEROQUEL XR) 50 MG TB24 24 hr tablet Take 1 tablet (50 mg total) by mouth at bedtime. (Patient not taking: Reported on 08/04/2021)   No facility-administered encounter medications on file as of 09/08/2021.    Allergies (verified) Bactrim [sulfamethoxazole-trimethoprim], Pollen extract, and Codeine   History: Past Medical History:  Diagnosis Date   Anxiety    Arthritis     Asthma    Complication of surgical procedure 03/08/2018   Constipation 07/17/2016   Depression    Headache    History of hiatal hernia    Hypertension    Immobility 09/25/2016   Kidney cyst, acquired    Seizures (HCC)    hx. as child   Shortness of breath dyspnea    with excertion   Past Surgical History:  Procedure Laterality Date   ABDOMINAL HYSTERECTOMY     BACK SURGERY     colonoscopy a year ago     LEG SURGERY Left 03/2015   mass removed from back - 20 years ago     SPINAL CORD STIMULATOR INSERTION N/A 01/17/2017   Procedure: LUMBAR SPINAL CORD STIMULATOR INSERTION;  Surgeon: Venita LickBrooks, Dahari, MD;  Location: [facility] OR;  Service: Orthopedics;  Laterality: N/A;  Requests 3 hrs   TONSILLECTOMY     TOTAL KNEE REVISION Left 05/13/2015   Procedure: REVISION LEFT  KNEE ARTHROPLASTY, REVISION PATELLA POLY EXCHANGE;  Surgeon: Samson FredericBrian Swinteck, MD;  Location: [facility];  Service: Orthopedics;  Laterality: Left;   Family History  Problem Relation Age of Onset   Cancer Mother    Cancer Father    Diabetes Brother    Cancer Brother    Cancer Brother    Social History   Socioeconomic History   Marital status: Divorced    Spouse name: Not on file   Number of children: Not on file   Years of education: Not on file   Highest education level: Not on file  Occupational History   Not on file  Tobacco Use   Smoking status: Never   Smokeless tobacco: Never  Vaping Use   Vaping Use: Never used  Substance and Sexual Activity   Alcohol use: No    Alcohol/week: 0.0 standard drinks   Drug use: No   Sexual activity: Never  Other Topics Concern   Not on file  Social History Narrative   Not on file   Social Determinants of Health   Financial Resource Strain: Low Risk    Difficulty of Paying Living Expenses: Not hard at all  Food Insecurity: No Food Insecurity   Worried About Programme researcher, broadcasting/film/videounning Out of Food in the Last Year: Never true   Ran Out of Food in the Last Year: Never true  Transportation Needs:  No Transportation Needs   Lack of Transportation (Medical): No   Lack of Transportation (Non-Medical): No  Physical Activity: Inactive   Days of Exercise per Week: 0 days   Minutes of Exercise per Session: 0 min  Stress: No Stress Concern Present   Feeling of Stress : Only a little  Social Connections: Socially Isolated   Frequency of Communication with Friends and Family: Once a week   Frequency of Social Gatherings with Friends and Family: Once a week   Attends Religious Services: Never   Diplomatic Services operational officerActive Member of Clubs or Organizations: No   Attends Engineer, structuralClub or Organization Meetings: Never   Marital Status: Divorced    Tobacco Counseling Counseling given: Not Answered   Clinical Intake:  Pre-visit preparation completed: Yes  Pain : No/denies pain     Nutritional Risks: None Diabetes: No  How often do you need to have someone help you when you read instructions, pamphlets, or other written materials from your doctor or pharmacy?: 1 - Never  Diabetic?  no  Interpreter Needed?: No  Information entered by :: Remi HaggardJulie Channa Hazelett LPN   Activities of Daily Living In your present state of health, do you have any difficulty performing the following activities: 09/08/2021  Hearing? Y  Vision? N  Difficulty concentrating or making decisions? N  Walking or climbing stairs? Y  Dressing or bathing? N  Doing errands, shopping? Y  Preparing Food and eating ? N  Using the Toilet? N  In the past six months, have you accidently leaked urine? N  Do you have problems with loss of bowel control? N  Managing your Medications? N  Managing your Finances? N  Housekeeping or managing your Housekeeping? N  Some recent data might be hidden    Patient Care Team: Dorcas CarrowJohnson, Megan P, DO as PCP - General (Family Medicine) Samson FredericSwinteck, Brian, MD as Consulting Physician (Orthopedic Surgery) Marlowe Saxate, Pamela J, RN as Case Manager (General Practice) Gustavus BryantJoyce, Brooke L, LCSW as Social Worker (Licensed Clinical Social  Worker)  Indicate any recent [facility] you may have received from other than [facility] providers in the past year (date may be approximate).     Assessment:   This is a routine wellness examination for [place].  Hearing/Vision screen Hearing Screening - Comments:: Has hearing aids in right ear.  Vision Screening - Comments:: Not up to date [facility]  Dietary issues and exercise activities discussed: Current Exercise Habits: The patient does not participate in regular exercise at present, Intensity: Not Applicable, Exercise limited by: None identified   Goals Addressed   None    Depression Screen PHQ 2/9 Scores 09/08/2021 09/08/2021 06/24/2021 06/24/2020 06/24/2020 02/27/2020 12/15/2019  PHQ - 2 Score 3 3 0 0 0 1 0  PHQ- 9 Score 3 3 - 0 - 11 4    Fall Risk Fall Risk  09/08/2021  01/12/2020 12/24/2018 09/14/2017 05/04/2017  Falls in the past year? 0 0 0 No No  Number falls in past yr: 0 0 0 - -  Injury with Fall? 0 0 0 - -  Follow up Falls evaluation completed;Falls prevention discussed - - - -    FALL RISK PREVENTION PERTAINING TO THE HOME:  Any stairs in or around the home? No  If so, are there any without handrails? No  Home free of loose throw rugs in walkways, pet beds, electrical cords, etc? Yes  Adequate lighting in your home to reduce risk of falls? Yes   ASSISTIVE DEVICES UTILIZED TO PREVENT FALLS:  Life alert? No  Use of a cane, walker or w/c? No  Grab bars in the bathroom? No  Shower chair or bench in shower? No  Elevated toilet seat or a handicapped toilet? Yes   TIMED UP AND GO:  Was the test performed? No .    Cognitive Function: MMSE - Mini Mental State Exam 09/30/2018 09/12/2018 12/28/2017  Orientation to time 5 5 5   Orientation to Place 5 4 5   Registration 3 3 3   Attention/ Calculation 4 2 3   Recall 3 3 2   Language- name 2 objects 2 2 2   Language- repeat 1 0 0  Language- follow 3 step command 2 1 3   Language- read & follow direction 1 0 1  Write a sentence  1 0 1  Copy design 0 0 0  Total score 27 20 25      6CIT Screen 09/08/2021 05/04/2017  What Year? 0 points 0 points  What month? 0 points 0 points  What time? 0 points 0 points  Count back from 20 0 points 0 points  Months in reverse 0 points 0 points  Repeat phrase 4 points 0 points  Total Score 4 0    Immunizations Immunization History  Administered Date(s) Administered   Fluad Quad(high Dose 65+) 05/22/2019, 06/24/2020   Influenza, High Dose Seasonal PF 07/17/2016, 09/14/2017, 06/10/2018   Influenza,inj,Quad PF,6+ Mos 05/15/2015   PFIZER(Purple Top)SARS-COV-2 Vaccination 11/13/2019, 12/11/2019   Pneumococcal Conjugate-13 12/22/2013   Pneumococcal Polysaccharide-23 12/22/2008, 12/27/2015   Td 02/27/2020   Zoster, Live 12/22/2013    TDAP status: Up to date  Flu Vaccine status: Declined, Education has been provided regarding the importance of this vaccine but patient still declined. Advised may receive this vaccine at local pharmacy or Health Dept. Aware to provide a copy of the vaccination record if obtained from local pharmacy or Health Dept. Verbalized acceptance and understanding.  Pneumococcal vaccine status: Up to date  Covid-19 vaccine status: Declined, Education has been provided regarding the importance of this vaccine but patient still declined. Advised may receive this vaccine at local pharmacy or Health Dept.or vaccine clinic. Aware to provide a copy of the vaccination record if obtained from local pharmacy or Health Dept. Verbalized acceptance and understanding.  Qualifies for Shingles Vaccine? Yes   Zostavax completed Yes   Shingrix Completed?: No.    Education has been provided regarding the importance of this vaccine. Patient has been advised to call insurance company to determine out of pocket expense if they have not yet received this vaccine. Advised may also receive vaccine at local pharmacy or Health Dept. Verbalized acceptance and understanding.  Screening  Tests Health Maintenance  Topic Date Due   Zoster Vaccines- Shingrix (1 of 2) Never done   COVID-19 Vaccine (3 - Booster for Pfizer series) 02/05/2020   TETANUS/TDAP  02/26/2030   Pneumonia Vaccine  12+ Years old  Completed   DEXA SCAN  Completed   Hepatitis C Screening  Completed   HPV VACCINES  Aged Out   INFLUENZA VACCINE  Discontinued   COLONOSCOPY (Pts 45-49yrs Insurance coverage will need to be confirmed)  Discontinued    Health Maintenance  Health Maintenance Due  Topic Date Due   Zoster Vaccines- Shingrix (1 of 2) Never done   COVID-19 Vaccine (3 - Booster for Pfizer series) 02/05/2020    Colorectal cancer screening: No longer required.   Mammogram status: No longer required due to  .    Lung Cancer Screening: (Low Dose CT Chest recommended if Age 76-80 years, 30 pack-year currently smoking OR have quit w/in 15years.) does not qualify.   Lung Cancer Screening Referral:   Additional Screening:  Hepatitis C Screening: does not qualify; Completed 2021  Vision Screening: Recommended annual ophthalmology exams for early detection of glaucoma and other disorders of the eye. Is the patient up to date with their annual eye exam?  Yes  Who is the provider or what is the name of the office in which the patient attends annual eye exams? [facility] If pt is not established with a provider, would they like to be referred to a provider to establish care? No .   Dental Screening: Recommended annual dental exams for proper oral hygiene  Community Resource Referral / Chronic Care Management: CRR required this visit?  No   CCM required this visit?  No      Plan:     I have personally reviewed and noted the following in the patients chart:   Medical and social history Use of alcohol, tobacco or illicit drugs  Current medications and supplements including opioid prescriptions.  Functional ability and status Nutritional status Physical activity Advanced directives List  of other physicians Hospitalizations, surgeries, and ER visits in previous 12 months Vitals Screenings to include cognitive, depression, and falls Referrals and appointments  In addition, I have reviewed and discussed with patient certain preventive protocols, quality metrics, and best practice recommendations. A written personalized care plan for preventive services as well as general preventive health recommendations were provided to patient.     Remi Haggard, LPN   0/11/2120   Nurse Notes:

## 2021-09-08 NOTE — Patient Instructions (Signed)
Renee Bridges , Thank you for taking time to come for your Medicare Wellness Visit. I appreciate your ongoing commitment to your health goals. Please review the following plan we discussed and let me know if I can assist you in the future.   Screening recommendations/referrals: Colonoscopy: no longer indicated Mammogram: Education provided Bone Density: Education  provided Recommended yearly ophthalmology/optometry visit for glaucoma screening and checkup Recommended yearly dental visit for hygiene and checkup  Vaccinations: Influenza vaccine: Education provided Pneumococcal vaccine: up to date Tdap vaccine: up tod ate Shingles vaccine: Education provided    Advanced directives: Education provided  Conditions/risks identified:   Next appointment: 11-02-2021 @ 1:00  Case Manager  phone call.   Preventive Care 21 Years and Older, Female Preventive care refers to lifestyle choices and visits with your health care provider that can promote health and wellness. What does preventive care include? A yearly physical exam. This is also called an annual well check. Dental exams once or twice a year. Routine eye exams. Ask your health care provider how often you should have your eyes checked. Personal lifestyle choices, including: Daily care of your teeth and gums. Regular physical activity. Eating a healthy diet. Avoiding tobacco and drug use. Limiting alcohol use. Practicing safe sex. Taking low-dose aspirin every day. Taking vitamin and mineral supplements as recommended by your health care provider. What happens during an annual well check? The services and screenings done by your health care provider during your annual well check will depend on your age, overall health, lifestyle risk factors, and family history of disease. Counseling  Your health care provider may ask you questions about your: Alcohol use. Tobacco use. Drug use. Emotional well-being. Home and relationship  well-being. Sexual activity. Eating habits. History of falls. Memory and ability to understand (cognition). Work and work Statistician. Reproductive health. Screening  You may have the following tests or measurements: Height, weight, and BMI. Blood pressure. Lipid and cholesterol levels. These may be checked every 5 years, or more frequently if you are over 27 years old. Skin check. Lung cancer screening. You may have this screening every year starting at age 58 if you have a 30-pack-year history of smoking and currently smoke or have quit within the past 15 years. Fecal occult blood test (FOBT) of the stool. You may have this test every year starting at age 66. Flexible sigmoidoscopy or colonoscopy. You may have a sigmoidoscopy every 5 years or a colonoscopy every 10 years starting at age 39. Hepatitis C blood test. Hepatitis B blood test. Sexually transmitted disease (STD) testing. Diabetes screening. This is done by checking your blood sugar (glucose) after you have not eaten for a while (fasting). You may have this done every 1-3 years. Bone density scan. This is done to screen for osteoporosis. You may have this done starting at age 72. Mammogram. This may be done every 1-2 years. Talk to your health care provider about how often you should have regular mammograms. Talk with your health care provider about your test results, treatment options, and if necessary, the need for more tests. Vaccines  Your health care provider may recommend certain vaccines, such as: Influenza vaccine. This is recommended every year. Tetanus, diphtheria, and acellular pertussis (Tdap, Td) vaccine. You may need a Td booster every 10 years. Zoster vaccine. You may need this after age 56. Pneumococcal 13-valent conjugate (PCV13) vaccine. One dose is recommended after age 56. Pneumococcal polysaccharide (PPSV23) vaccine. One dose is recommended after age 31. Talk to your  health care provider about which  screenings and vaccines you need and how often you need them. This information is not intended to replace advice given to you by your health care provider. Make sure you discuss any questions you have with your health care provider. Document Released: 09/17/2015 Document Revised: 05/10/2016 Document Reviewed: 06/22/2015 Elsevier Interactive Patient Education  2017 [place] Prevention in the Home Falls can cause injuries. They can happen to people of all ages. There are many things you can do to make your home safe and to help prevent falls. What can I do on the outside of my home? Regularly fix the edges of walkways and driveways and fix any cracks. Remove anything that might make you trip as you walk through a door, such as a raised step or threshold. Trim any bushes or trees on the path to your home. Use bright outdoor lighting. Clear any walking paths of anything that might make someone trip, such as rocks or tools. Regularly check to see if handrails are loose or broken. Make sure that both sides of any steps have handrails. Any raised decks and porches should have guardrails on the edges. Have any leaves, snow, or ice cleared regularly. Use sand or salt on walking paths during winter. Clean up any spills in your garage right away. This includes oil or grease spills. What can I do in the bathroom? Use night lights. Install grab bars by the toilet and in the tub and shower. Do not use towel bars as grab bars. Use non-skid mats or decals in the tub or shower. If you need to sit down in the shower, use a plastic, non-slip stool. Keep the floor dry. Clean up any water that spills on the floor as soon as it happens. Remove soap buildup in the tub or shower regularly. Attach bath mats securely with double-sided non-slip rug tape. Do not have throw rugs and other things on the floor that can make you trip. What can I do in the bedroom? Use night lights. Make sure that you have a  light by your bed that is easy to reach. Do not use any sheets or blankets that are too big for your bed. They should not hang down onto the floor. Have a firm chair that has side arms. You can use this for support while you get dressed. Do not have throw rugs and other things on the floor that can make you trip. What can I do in the kitchen? Clean up any spills right away. Avoid walking on wet floors. Keep items that you use a lot in easy-to-reach places. If you need to reach something above you, use a strong step stool that has a grab bar. Keep electrical cords out of the way. Do not use floor polish or wax that makes floors slippery. If you must use wax, use non-skid floor wax. Do not have throw rugs and other things on the floor that can make you trip. What can I do with my stairs? Do not leave any items on the stairs. Make sure that there are handrails on both sides of the stairs and use them. Fix handrails that are broken or loose. Make sure that handrails are as long as the stairways. Check any carpeting to make sure that it is firmly attached to the stairs. Fix any carpet that is loose or worn. Avoid having throw rugs at the top or bottom of the stairs. If you do have throw rugs, attach them  to the floor with carpet tape. Make sure that you have a light switch at the top of the stairs and the bottom of the stairs. If you do not have them, ask someone to add them for you. What else can I do to help prevent falls? Wear shoes that: Do not have high heels. Have rubber bottoms. Are comfortable and fit you well. Are closed at the toe. Do not wear sandals. If you use a stepladder: Make sure that it is fully opened. Do not climb a closed stepladder. Make sure that both sides of the stepladder are locked into place. Ask someone to hold it for you, if possible. Clearly mark and make sure that you can see: Any grab bars or handrails. First and last steps. Where the edge of each step  is. Use tools that help you move around (mobility aids) if they are needed. These include: Canes. Walkers. Scooters. Crutches. Turn on the lights when you go into a dark area. Replace any light bulbs as soon as they burn out. Set up your furniture so you have a clear path. Avoid moving your furniture around. If any of your floors are uneven, fix them. If there are any pets around you, be aware of where they are. Review your medicines with your doctor. Some medicines can make you feel dizzy. This can increase your chance of falling. Ask your doctor what other things that you can do to help prevent falls. This information is not intended to replace advice given to you by your health care provider. Make sure you discuss any questions you have with your health care provider. Document Released: 06/17/2009 Document Revised: 01/27/2016 Document Reviewed: 09/25/2014 Elsevier Interactive Patient Education  2017 [employer].

## 2021-10-04 ENCOUNTER — Telehealth (INDEPENDENT_AMBULATORY_CARE_PROVIDER_SITE_OTHER): Payer: Medicare Other | Admitting: Nurse Practitioner

## 2021-10-04 ENCOUNTER — Encounter: Payer: Self-pay | Admitting: Nurse Practitioner

## 2021-10-04 VITALS — Wt 151.0 lb

## 2021-10-04 DIAGNOSIS — U071 COVID-19: Secondary | ICD-10-CM | POA: Insufficient documentation

## 2021-10-04 MED ORDER — PREDNISONE 10 MG PO TABS: 10.0000 mg | ORAL_TABLET | Freq: Every day | ORAL | 0 refills | Status: DC

## 2021-10-04 NOTE — Progress Notes (Signed)
Wt 151 lb (68.5 kg)    LMP  (LMP Unknown)    BMI 28.53 kg/m    Subjective:    Patient ID: Renee Bridges, female    DOB: 1946/08/20, 76 y.o.   MRN: [id number]  HPI: Renee Bridges is a 75 y.o. female  Chief Complaint  Patient presents with   Covid Positive    Patient states she took 2 at-home test. Patient states one test was positive and then the other test was negative. Patient states she received the test from her insurance. Patient states her symptoms started with a really bad headache, cough, and she is having vomiting at night. Patient states she is also having night-sweats. Patient states her symptoms about 2 weeks ago.    COVID POSITIVE Patient states she took 2 at-home test. Patient states one test was positive and then the other test was negative. Patient states she received the test from her insurance. Patient states her symptoms started with a really bad headache, cough, and she is having vomiting mucus at night. Patient states she is also having night-sweats. Patient states her symptoms about 2 weeks ago.  She is also having some fatigue and decreased appetite.  She is having some SOB and wheezing.     Relevant past medical, surgical, family and social history reviewed and updated as indicated. Interim medical history since our last visit reviewed. Allergies and medications reviewed and updated.  Review of Systems  Constitutional:  Negative for fatigue.  HENT:  Positive for congestion.   Respiratory:  Positive for cough. Negative for shortness of breath and wheezing.   Gastrointestinal:  Positive for vomiting.  Neurological:  Positive for headaches.   Per HPI unless specifically indicated above     Objective:    Wt 151 lb (68.5 kg)    LMP  (LMP Unknown)    BMI 28.53 kg/m   Wt Readings from Last 3 Encounters:  10/04/21 151 lb (68.5 kg)  08/04/21 176 lb (79.8 kg)  10/04/20 162 lb (73.5 kg)    Physical Exam Vitals and nursing note reviewed.  Pulmonary:      Effort: Pulmonary effort is normal. No respiratory distress.  Neurological:     Mental Status: She is alert.  Psychiatric:        Mood and Affect: Mood normal.        Behavior: Behavior normal.        Thought Content: Thought content normal.        Judgment: Judgment normal.    Results for orders placed or performed in visit on 08/04/21  Urine Culture   Specimen: Urine   UR  Result Value Ref Range   Urine Culture, Routine Final report (A)    Organism ID, Bacteria Escherichia coli (A)    Antimicrobial Susceptibility Comment   Microscopic Examination   Urine  Result Value Ref Range   WBC, UA 11-30 (A) 0 - 5 /hpf   RBC 3-10 (A) 0 - 2 /hpf   Epithelial Cells (non renal) 0-10 0 - 10 /hpf   Mucus, UA Present (A) Not Estab.   Bacteria, UA Few (A) None seen/Few  Urinalysis, Routine w reflex microscopic  Result Value Ref Range   Specific Gravity, UA 1.025 1.005 - 1.030   pH, UA 5.5 5.0 - 7.5   Color, UA Yellow Yellow   Appearance Ur Cloudy (A) Clear   Leukocytes,UA 2+ (A) Negative   Protein,UA Trace (A) Negative/Trace   Glucose, UA Negative Negative  Ketones, UA Trace (A) Negative   RBC, UA 2+ (A) Negative   Bilirubin, UA Negative Negative   Urobilinogen, Ur 0.2 0.2 - 1.0 mg/dL   Nitrite, UA Positive (A) Negative   Microscopic Examination See below:       Assessment & Plan:   Problem List Items Addressed This Visit       Other   COVID-19 - Primary    Prednisone sent to the pharmacy for patient. Symptoms ongoing for 2 weeks. Would like to see her in office but she declines. Will do follow up phone visit on Friday to see if symptoms are improving. If not improving will need to come in for evaluation and blood work.         Follow up plan: Return in about 3 days (around 10/07/2021) for phone visit for COVID follow up.   This visit was completed via MyChart due to the restrictions of the COVID-19 pandemic. All issues as above were discussed and addressed. Physical exam  was done as above through visual confirmation on MyChart. If it was felt that the patient should be evaluated in the office, they were directed there. The patient verbally consented to this visit. Location of the patient: Home Location of the provider: Office Those involved with this call:  Provider: Larae Grooms, NP CMA: Malen Gauze, CMA Front Desk/Registration: Channing Mutters This encounter was conducted via telephone.  I spent 15 dedicated to the care of this patient on the date of this encounter to include previsit review of 21, face to face time with the patient, and post visit ordering of testing.

## 2021-10-04 NOTE — Assessment & Plan Note (Signed)
Prednisone sent to the pharmacy for patient. Symptoms ongoing for 2 weeks. Would like to see her in office but she declines. Will do follow up phone visit on Friday to see if symptoms are improving. If not improving will need to come in for evaluation and blood work.

## 2021-10-07 ENCOUNTER — Telehealth: Payer: Medicare Other | Admitting: Family Medicine

## 2021-11-02 ENCOUNTER — Ambulatory Visit (INDEPENDENT_AMBULATORY_CARE_PROVIDER_SITE_OTHER): Payer: Medicare Other

## 2021-11-02 ENCOUNTER — Telehealth: Payer: Medicare Other

## 2021-11-02 DIAGNOSIS — F419 Anxiety disorder, unspecified: Secondary | ICD-10-CM

## 2021-11-02 DIAGNOSIS — F339 Major depressive disorder, recurrent, unspecified: Secondary | ICD-10-CM

## 2021-11-02 DIAGNOSIS — E782 Mixed hyperlipidemia: Secondary | ICD-10-CM

## 2021-11-02 NOTE — Chronic Care Management (AMB) (Signed)
Chronic Care Management   CCM RN Visit Note  11/02/2021 Name: Renee Bridges MRN: [id number] DOB: [birth year]  Subjective: Renee Bridges is a 76 y.o. year old female who is a primary care patient of Valerie Roys, DO. The care management team was consulted for assistance with disease management and care coordination needs.    Engaged with patient by telephone for follow up visit in response to provider referral for case management and/or care coordination services.   Consent to Services:  The patient was given information about Chronic Care Management services, agreed to services, and gave verbal consent prior to initiation of services.  Please see initial visit note for detailed documentation.   Patient agreed to services and verbal consent obtained.   Assessment: Review of patient past medical history, allergies, medications, health status, including review of consultants reports, laboratory and other test data, was performed as part of comprehensive evaluation and provision of chronic care management services.   SDOH (Social Determinants of Health) assessments and interventions performed:    CCM Care Plan  Allergies  Allergen Reactions   Bactrim [Sulfamethoxazole-Trimethoprim] Other (See Comments)    Sweating, feeling like throat is closing up   Pollen Extract    Codeine Rash    Outpatient Encounter Medications as of 11/02/2021  Medication Sig   atorvastatin (LIPITOR) 20 MG tablet Take 1 tablet (20 mg total) by mouth daily.   nitrofurantoin, macrocrystal-monohydrate, (MACROBID) 100 MG capsule Take 1 capsule (100 mg total) by mouth 2 (two) times daily. (Patient not taking: Reported on 10/04/2021)   predniSONE (DELTASONE) 10 MG tablet Take 1 tablet (10 mg total) by mouth daily with breakfast.   QUEtiapine (SEROQUEL XR) 50 MG TB24 24 hr tablet Take 1 tablet (50 mg total) by mouth at bedtime. (Patient not taking: Reported on 08/04/2021)   No facility-administered encounter  medications on file as of 11/02/2021.    Patient Active Problem List   Diagnosis Date Noted   COVID-19 10/04/2021   Driving safety issue 10/04/2020   Pain in joint of right hip 05/05/2019   Hyperlipidemia 04/03/2018   Mental status, decreased 12/28/2017   Depression, recurrent ([place]) 10/16/2017   Advanced care planning/counseling discussion 06/01/2017   Chronic kidney disease, stage 3 ([place]) 03/21/2017   Chronic back pain 09/25/2016   Cyst of right kidney 07/17/2016   Benign hypertensive renal disease 12/27/2015   Failed total left knee replacement ([place]) 05/13/2015   Anxiety 03/25/2015   Urge incontinence 06/30/2013    Conditions to be addressed/monitored:HLD, Anxiety, and Depression  Care Plan : RNCM: General Plan of Care (Adult) for Chronic Disease Management and Care Coordination Needs  Updates made by Vanita Ingles, RN since 11/02/2021 12:00 AM     Problem: RNCM: Development of plan of care for Chronic Disease Management and Care Coordination Needs (anxiety, depression, HLD, chronic pain )   Priority: High     Long-Range Goal: Effective management and support for Chronic Diseases (Anxiety, Depression, HLD, chronic pain )   Start Date: 06/24/2021  Expected End Date: 06/24/2022  Priority: High  Note:   Current Barriers:  Knowledge Deficits related to plan of care for management of HLD and Anxiety with Excessive Worry, Social Anxiety, and Depression: depressed mood, chronic pain  anxiety  Care Coordination needs related to Limited social support, Transportation, and Mental Health Concerns   Chronic Disease Management support and education needs related to HLD and Anxiety with Excessive Worry, Social Anxiety, and Depression: depressed mood Anxiety, chronic pain  Lacks caregiver support.  Transportation barriers  RNCM Clinical Goal(s):  Patient will verbalize understanding of plan for management of HLD, Anxiety, and Depression  verbalize basic understanding of HLD, Anxiety,  Chronic pain, and Depression disease process and self health management plan  take all medications exactly as prescribed and will call provider for medication related questions demonstrate improved and ongoing adherence to prescribed treatment plan for HLD, Chronic pain, Anxiety, and Depression as evidenced by adherence to prescribed medication regimen contacting provider for new or worsened symptoms or questions  demonstrate improved and ongoing health management independence  continue to work with Consulting civil engineer and/or Social Worker to address care management and care coordination needs related to HLD, Anxiety, chronic pain, and Depression  demonstrate a decrease in HLD, Anxiety, Chronic pain, and Depression exacerbations  demonstrate ongoing self health care management ability for effective management of chronic diseases through collaboration with RN Care manager, provider, and care team.   Interventions: 1:1 collaboration with primary care provider regarding development and update of comprehensive plan of care as evidenced by provider attestation and co-signature Inter-disciplinary care team collaboration (see longitudinal plan of care) Evaluation of current treatment plan related to  self management and patient's adherence to plan as established by provider   SDOH Barriers (Status: Goal on track: YES.)  Patient interviewed and SDOH assessment performed        SDOH Interventions    Flowsheet Row Most Recent Value  SDOH Interventions   Food Insecurity Interventions Intervention Not Indicated  Financial Strain Interventions Other (Comment)  [fixed income, granddaughter helps her with management of her bills]  Housing Interventions Intervention Not Indicated  Intimate Partner Violence Interventions Intervention Not Indicated  Physical Activity Interventions Other (Comments)  [no structured activity]  Stress Interventions Intervention Not Indicated  Social Connections Interventions Other  (Comment)  [Has support from her family. Gets lonely, support given]  Transportation Interventions Other (Comment)  [patient has resources and most of the time her granddaughter arranges her schedule so she can take the patient to appointments]     Provided patient with information about resources to help with needs in the community  Discussed plans with patient for ongoing care management follow up and provided patient with direct contact information for care management team Advised patient to call the office for changes in [place], questions, or needs    Anxiety and Depression   (Status: Goal on track: YES.) Evaluation of current treatment plan related to Anxiety and Depression, Limited social support, Transportation, and Mental Health Concerns  self-management and patient's adherence to plan as established by provider. 08-31-2021: The patient is always sad this time of year because it is the month that her daughter died but the granddaughter states that she is doing fairly well and the family has spent time with her for Christmas. The granddaughter did mention that the patient is hoarding and having a lot of stuff in her house. She states she picks things up that other people put out that is trash. She states that she thinks she hurt herself because recently the patient got a big mirror from the curb that would go on a dresser and she can tell she is hurting and in pain. She is concerned about the condition of the patients apartment. Education and support given. 11-02-2021: The patient is doing better since getting past Christmas and her daughters who is deceased birthday. She is stable and her granddaughter states she still has her moments but overall is doing well. Will continue  to monitor for changes.  Discussed plans with patient for ongoing care management follow up and provided patient with direct contact information for care management team Advised patient to call the office for changes in mood,  anxiety, depression; Provided education to patient re: effective management of depression and anxiety, working with the CCM team to meet mental health needs, doing things the patient enjoys doing, talk to others about how she is feeling when she is down; Reviewed medications with patient and discussed compliance. 11-02-2021:The patient is not always compliant with medications. Her granddaughter encourages her to take medications on a conistent basis. ; Discussed plans with patient for ongoing care management follow up and provided patient with direct contact information for care management team;  Hyperlipidemia:  (Status: Goal on track: NO.) Lab Results  Component Value Date   CHOL 208 (H) 10/04/2020   HDL 41 10/04/2020   LDLCALC 141 (H) 10/04/2020   TRIG 146 10/04/2020     Medication review performed; medication list updated in electronic medical record. 11-02-2021: Is not consistently taking anything for HLD management. The patient has a known history of non-compliance with medications.  Provider established cholesterol goals reviewed; Counseled on importance of regular laboratory monitoring as prescribed. 11-02-2021: Reminded the patients granddaughter to call the office to schedule an appointment for patient to be seen for evaluation of labs and new lab work possibly; Provided HLD educational materials; Reviewed role and benefits of statin for ASCVD risk reduction; Discussed strategies to manage statin-induced myalgias; Reviewed importance of limiting foods high in cholesterol. 11-02-2021: Review and education done. The patients granddaughter states that she has been sick with a "stomach issue". She thinks it has been going through her apartment complex. The granddaughter feels it is sometimes things she eats as well.    Pain:  (Status: Goal on track: NO.) Long Term Goal Added on 08-31-2021 Pain assessment performed. 08-31-2021: The patients granddaughter states the patient tells her that her  knees are hurting. She is walking fine but she states when she is getting in and out of the granddaughters car or sitting that she is complaining about her knees hurting. Unable to get a number of her pain level. The granddaughter thinks she hurt herself when she picked up a large Patent attorney from the curb. She has noticed her grimacing when she is with her. Education on getting and appointment to see pcp or taking her to [place] for after hours support. The granddaughter states she has several appointments coming up for her children and she would need to come in as late as possible to see the pcp. Education on the granddaughter calling the office and seeing if she could get a late appointment with Dr. Wynetta Emery. 11-02-2021: Pain is better and she denies any issues related to pain. She is still in need of a shower chair but the insurance is lagging with getting it. Discussed options with the patients granddaughter today for obtaining a shower chair for the patient.  Medications reviewed Reviewed provider established plan for pain management; Discussed importance of adherence to all scheduled medical appointments. 08-31-2021: Discussed calling the office and getting an appointment with the pcp for evaluation and recommendations of knee pain.  Counseled on the importance of reporting any/all new or changed pain symptoms or management strategies to pain management provider; Advised patient to report to care team affect of pain on daily activities; Discussed use of relaxation techniques and/or diversional activities to assist with pain reduction (distraction, imagery, relaxation, massage, acupressure, TENS,  heat, and cold application; Reviewed with patient prescribed pharmacological and nonpharmacological pain relief strategies; Advised patient to discuss unresolved pain, changes in level or intensity of pain with provider; Screening for signs and symptoms of depression related to chronic disease state;   Assessed social determinant of health barriers;   Review of UTI sx and sx. The patients granddaughter states there has been resolution in UTI sx and sx  Patient Goals/Self-Care Activities: Patient will self administer medications as prescribed as evidenced by self report/primary caregiver report  Patient will attend all scheduled provider appointments as evidenced by clinician review of documented attendance to scheduled appointments and patient/caregiver report Patient will call pharmacy for medication refills as evidenced by patient report and review of pharmacy fill history as appropriate Patient will attend church or other social activities as evidenced by patient report Patient will continue to perform ADL's independently as evidenced by patient/caregiver report Patient will continue to perform IADL's independently as evidenced by patient/caregiver report Patient will call provider office for new concerns or questions as evidenced by review of documented incoming telephone call notes and patient report Patient will work with BSW to address care coordination needs and will continue to work with the clinical team to address health care and disease management related needs as evidenced by documented adherence to scheduled care management/care coordination appointments - call for medicine refill 2 or 3 days before it runs out - take all medications exactly as prescribed - call doctor with any symptoms you believe are related to your medicine - call doctor when you experience any new symptoms - go to all doctor appointments as scheduled - adhere to prescribed diet: heart healthy        Plan:Telephone follow up appointment with care management team member scheduled for:  01-04-2022 at 1 pm  [place], MSN, [facility] Mobile: [phone]

## 2021-11-02 NOTE — Patient Instructions (Signed)
Visit Information  Thank you for taking time to visit with me today. Please don't hesitate to contact me if I can be of assistance to you before our next scheduled telephone appointment.  Following are the goals we discussed today:  RNCM Clinical Goal(s):  Patient will verbalize understanding of plan for management of HLD, Anxiety, and Depression  verbalize basic understanding of HLD, Anxiety, Chronic pain, and Depression disease process and self health management plan  take all medications exactly as prescribed and will call provider for medication related questions demonstrate improved and ongoing adherence to prescribed treatment plan for HLD, Chronic pain, Anxiety, and Depression as evidenced by adherence to prescribed medication regimen contacting provider for new or worsened symptoms or questions  demonstrate improved and ongoing health management independence  continue to work with Medical illustrator and/or Social Worker to address care management and care coordination needs related to HLD, Anxiety, chronic pain, and Depression  demonstrate a decrease in HLD, Anxiety, Chronic pain, and Depression exacerbations  demonstrate ongoing self health care management ability for effective management of chronic diseases through collaboration with RN Care manager, provider, and care team.    Interventions: 1:1 collaboration with primary care provider regarding development and update of comprehensive plan of care as evidenced by provider attestation and co-signature Inter-disciplinary care team collaboration (see longitudinal plan of care) Evaluation of current treatment plan related to  self management and patient's adherence to plan as established by provider     SDOH Barriers (Status: Goal on track: YES.)  Patient interviewed and SDOH assessment performed        SDOH Interventions     Flowsheet Row Most Recent Value  SDOH Interventions    Food Insecurity Interventions Intervention Not  Indicated  Financial Strain Interventions Other (Comment)  [fixed income, granddaughter helps her with management of her bills]  Housing Interventions Intervention Not Indicated  Intimate Partner Violence Interventions Intervention Not Indicated  Physical Activity Interventions Other (Comments)  [no structured activity]  Stress Interventions Intervention Not Indicated  Social Connections Interventions Other (Comment)  [Has support from her family. Gets lonely, support given]  Transportation Interventions Other (Comment)  [patient has resources and most of the time her granddaughter arranges her schedule so she can take the patient to appointments]       Provided patient with information about resources to help with needs in the community  Discussed plans with patient for ongoing care management follow up and provided patient with direct contact information for care management team Advised patient to call the office for changes in SDOH, questions, or needs       Anxiety and Depression   (Status: Goal on track: YES.) Evaluation of current treatment plan related to Anxiety and Depression, Limited social support, Transportation, and Mental Health Concerns  self-management and patient's adherence to plan as established by provider. 08-31-2021: The patient is always sad this time of year because it is the month that her daughter died but the granddaughter states that she is doing fairly well and the family has spent time with her for Christmas. The granddaughter did mention that the patient is hoarding and having a lot of stuff in her house. She states she picks things up that other people put out that is trash. She states that she thinks she hurt herself because recently the patient got a big mirror from the curb that would go on a dresser and she can tell she is hurting and in pain. She is concerned about the condition  of the patients apartment. Education and support given. 11-02-2021: The patient is  doing better since getting past Christmas and her daughters who is deceased birthday. She is stable and her granddaughter states she still has her moments but overall is doing well. Will continue to monitor for changes.  Discussed plans with patient for ongoing care management follow up and provided patient with direct contact information for care management team Advised patient to call the office for changes in mood, anxiety, depression; Provided education to patient re: effective management of depression and anxiety, working with the CCM team to meet mental health needs, doing things the patient enjoys doing, talk to others about how she is feeling when she is down; Reviewed medications with patient and discussed compliance. 11-02-2021:The patient is not always compliant with medications. Her granddaughter encourages her to take medications on a conistent basis. ; Discussed plans with patient for ongoing care management follow up and provided patient with direct contact information for care management team;   Hyperlipidemia:  (Status: Goal on track: NO.)      Lab Results  Component Value Date    CHOL 208 (H) 10/04/2020    HDL 41 10/04/2020    LDLCALC 141 (H) 10/04/2020    TRIG 146 10/04/2020      Medication review performed; medication list updated in electronic medical record. 11-02-2021: Is not consistently taking anything for HLD management. The patient has a known history of non-compliance with medications.  Provider established cholesterol goals reviewed; Counseled on importance of regular laboratory monitoring as prescribed. 11-02-2021: Reminded the patients granddaughter to call the office to schedule an appointment for patient to be seen for evaluation of labs and new lab work possibly; Provided HLD educational materials; Reviewed role and benefits of statin for ASCVD risk reduction; Discussed strategies to manage statin-induced myalgias; Reviewed importance of limiting foods high in  cholesterol. 11-02-2021: Review and education done. The patients granddaughter states that she has been sick with a "stomach issue". She thinks it has been going through her apartment complex. The granddaughter feels it is sometimes things she eats as well.      Pain:  (Status: Goal on track: NO.) Long Term Goal Added on 08-31-2021 Pain assessment performed. 08-31-2021: The patients granddaughter states the patient tells her that her knees are hurting. She is walking fine but she states when she is getting in and out of the granddaughters car or sitting that she is complaining about her knees hurting. Unable to get a number of her pain level. The granddaughter thinks she hurt herself when she picked up a large Forensic scientist from the curb. She has noticed her grimacing when she is with her. Education on getting and appointment to see pcp or taking her to [facility] for after hours support. The granddaughter states she has several appointments coming up for her children and she would need to come in as late as possible to see the pcp. Education on the granddaughter calling the office and seeing if she could get a late appointment with Dr. Laural Benes. 11-02-2021: Pain is better and she denies any issues related to pain. She is still in need of a shower chair but the insurance is lagging with getting it. Discussed options with the patients granddaughter today for obtaining a shower chair for the patient.  Medications reviewed Reviewed provider established plan for pain management; Discussed importance of adherence to all scheduled medical appointments. 08-31-2021: Discussed calling the office and getting an appointment with the pcp for evaluation  and recommendations of knee pain.  Counseled on the importance of reporting any/all new or changed pain symptoms or management strategies to pain management provider; Advised patient to report to care team affect of pain on daily activities; Discussed use of relaxation  techniques and/or diversional activities to assist with pain reduction (distraction, imagery, relaxation, massage, acupressure, TENS, heat, and cold application; Reviewed with patient prescribed pharmacological and nonpharmacological pain relief strategies; Advised patient to discuss unresolved pain, changes in level or intensity of pain with provider; Screening for signs and symptoms of depression related to chronic disease state;  Assessed social determinant of health barriers;   Review of UTI sx and sx. The patients granddaughter states there has been resolution in UTI sx and sx   Patient Goals/Self-Care Activities: Patient will self administer medications as prescribed as evidenced by self report/primary caregiver report  Patient will attend all scheduled provider appointments as evidenced by clinician review of documented attendance to scheduled appointments and patient/caregiver report Patient will call pharmacy for medication refills as evidenced by patient report and review of pharmacy fill history as appropriate Patient will attend church or other social activities as evidenced by patient report Patient will continue to perform ADL's independently as evidenced by patient/caregiver report Patient will continue to perform IADL's independently as evidenced by patient/caregiver report Patient will call provider office for new concerns or questions as evidenced by review of documented incoming telephone call notes and patient report Patient will work with BSW to address care coordination needs and will continue to work with the clinical team to address health care and disease management related needs as evidenced by documented adherence to scheduled care management/care coordination appointments - call for medicine refill 2 or 3 days before it runs out - take all medications exactly as prescribed - call doctor with any symptoms you believe are related to your medicine - call doctor when you  experience any new symptoms - go to all doctor appointments as scheduled - adhere to prescribed diet: heart healthy   Our next appointment is by telephone on 01-04-2022 at 1 pm  Please call the care guide team at [phone] if you need to cancel or reschedule your appointment.   If you are experiencing a Mental Health or Behavioral Health Crisis or need someone to talk to, please call the Suicide and Crisis Lifeline: 988 call the [place] National Suicide Prevention Lifeline: 2184602954 or TTY: 2184551641 TTY [phone]) to talk to a trained counselor call 1-800-273-TALK (toll free, 24 hour hotline)   Patient verbalizes understanding of instructions and care plan provided today and agrees to view in MyChart. Active MyChart status confirmed with patient.    Alto Denver RN, MSN, CCM Community Care Coordinator [place]   [facility] Mobile: [phone]

## 2021-12-02 ENCOUNTER — Ambulatory Visit
Admission: RE | Admit: 2021-12-02 | Discharge: 2021-12-02 | Disposition: A | Discharge status: Home / Self Care | Payer: Medicare Other | Source: Ambulatory Visit | Attending: Nurse Practitioner | Admitting: Nurse Practitioner

## 2021-12-02 ENCOUNTER — Ambulatory Visit (INDEPENDENT_AMBULATORY_CARE_PROVIDER_SITE_OTHER): Payer: Medicare Other | Admitting: Nurse Practitioner

## 2021-12-02 ENCOUNTER — Ambulatory Visit
Admission: RE | Admit: 2021-12-02 | Discharge: 2021-12-02 | Disposition: A | Discharge status: Home / Self Care | Payer: Medicare Other | Attending: Nurse Practitioner | Admitting: Nurse Practitioner

## 2021-12-02 ENCOUNTER — Encounter: Payer: Self-pay | Admitting: Nurse Practitioner

## 2021-12-02 VITALS — BP 140/79 | HR 78 | Temp 97.6°F | Wt 169.0 lb

## 2021-12-02 DIAGNOSIS — F32A Depression, unspecified: Secondary | ICD-10-CM

## 2021-12-02 DIAGNOSIS — M19012 Primary osteoarthritis, left shoulder: Secondary | ICD-10-CM | POA: Diagnosis not present

## 2021-12-02 DIAGNOSIS — M25512 Pain in left shoulder: Secondary | ICD-10-CM | POA: Diagnosis not present

## 2021-12-02 DIAGNOSIS — E785 Hyperlipidemia, unspecified: Secondary | ICD-10-CM

## 2021-12-02 MED ORDER — METHYLPREDNISOLONE 4 MG PO TBPK: ORAL_TABLET | ORAL | 0 refills | Status: DC

## 2021-12-02 MED ORDER — TIZANIDINE HCL 2 MG PO TABS
2.0000 mg | ORAL_TABLET | Freq: Four times a day (QID) | ORAL | 0 refills | Status: DC | PRN
Start: 2021-12-02 — End: 2022-01-20

## 2021-12-02 MED ORDER — NYSTATIN 100000 UNIT/GM EX POWD: 1.0000 "application " | Freq: Three times a day (TID) | CUTANEOUS | 0 refills | Status: DC

## 2021-12-02 NOTE — Patient Instructions (Addendum)
IMAGING LOCATION: ?[address] [place], [place] [address] ?[phone]  ? ?Shoulder Pain ?Many things can cause shoulder pain, including: ?An injury. ?Moving the shoulder in the same way again and again (overuse). ?Joint pain (arthritis). ?Pain can come from: ?Swelling and irritation (inflammation) of any part of the shoulder. ?An injury to the shoulder joint. ?An injury to: ?Tissues that connect muscle to bone (tendons). ?Tissues that connect bones to each other (ligaments). ?Bones. ?Follow these instructions at home: ?Watch for changes in your symptoms. Let your doctor know about them. Follow these instructions to help with your pain. ?If you have a sling: ?Wear the sling as told by your doctor. Remove it only as told by your doctor. ?Loosen the sling if your fingers: ?Tingle. ?Become numb. ?Turn cold and blue. ?Keep the sling clean. ?If the sling is not waterproof: ?Do not let it get wet. ?Take the sling off when you shower or bathe. ?Managing pain, stiffness, and swelling ? ?If told, put ice on the painful area: ?Put ice in a plastic bag. ?Place a towel between your skin and the bag. ?Leave the ice on for 20 minutes, 2-3 times a day. Stop putting ice on if it does not help with the pain. ?Squeeze a soft ball or a foam pad as much as possible. This prevents swelling in the shoulder. It also helps to strengthen the arm. ?General instructions ?Take over-the-counter and prescription medicines only as told by your doctor. ?Keep all follow-up visits as told by your doctor. This is important. ?Contact a doctor if: ?Your pain gets worse. ?Medicine does not help your pain. ?You have new pain in your arm, hand, or fingers. ?Get help right away if: ?Your arm, hand, or fingers: ?Tingle. ?Are numb. ?Are swollen. ?Are painful. ?Turn white or blue. ?Summary ?Shoulder pain can be caused by many things. These include injury, moving the shoulder in the same away again and again, and joint pain. ?Watch for changes in  your symptoms. Let your doctor know about them. ?This condition may be treated with a sling, ice, and pain medicine. ?Contact your doctor if the pain gets worse or you have new pain. Get help right away if your arm, hand, or fingers tingle or get numb, swollen, or painful. ?Keep all follow-up visits as told by your doctor. This is important. ?This information is not intended to replace advice given to you by your health care provider. Make sure you discuss any questions you have with your health care provider. ?Document Revised: 05/06/2021 Document Reviewed: 05/06/2021 ?Elsevier Patient Education ? 2022 [employer] ? ?

## 2021-12-02 NOTE — Assessment & Plan Note (Addendum)
Acute for 1 1/2 months with ongoing pain and decreased ROM.  At this time will obtain imagining of left shoulder -- ?shoulder impingement vs rotator cuff issue.  Scripts for steroid taper and Tizanidine sent (age >73).  Provided samples of Voltaren gel in office to utilize, recommend avoid Ibuprofen due to CKD.  Continue Tylenol as needed, may take 1000 MG TID as needed.  Referral to ortho placed.  Return in 4 weeks. ?

## 2021-12-02 NOTE — Progress Notes (Signed)
? ?BP 140/79   Pulse 78   Temp 97.6 ?F (36.4 ?C) (Oral)   Wt 169 lb (76.7 kg)   LMP  (LMP Unknown)   SpO2 97%   BMI 31.93 kg/m?   ? ?Subjective:  ? ? Patient ID: Renee Bridges, female    DOB: December 23, 1945, 76 y.o.   MRN: [id number] ? ?HPI: ?Renee Bridges is a 76 y.o. female ? ?Chief Complaint  ?Patient presents with  ? Shoulder Injury  ?  Patient granddaughter says patient has recently being rearranging her apartment and she may have hurt her shoulder. Patient states she has been having pain since then. Patient says the pain and discomfort comes and goes and says she can't bring the shoulder all the way up and has tried Tylenol.   ? ?Granddaughter at bedside to assist with HPI. ? ?SHOULDER PAIN ?To left shoulder -- has been rearranging her apartment recently.  Has been hurting for 1 1/2 months. ?Duration: months ?Involved shoulder: left ?Mechanism of injury: unknown ?Location: lateral ?Onset:gradual ?Severity: 8/10 at worst and at best 9/10 today ?Quality:  sharp, dull, aching, and throbbing ?Frequency: constant ?Radiation: no ?Aggravating factors: lifting and movement  ?Alleviating factors: none  ?Status: worse and stable ?Treatments attempted: rest, heat, and APAP  ?Relief with NSAIDs?:  No NSAIDs Taken ?Weakness: yes ?Numbness: no ?Decreased grip strength: yes ?Redness: no ?Swelling: no ?Bruising: no ?Fevers: no  ? ?Relevant past medical, surgical, family and social history reviewed and updated as indicated. Interim medical history since our last visit reviewed. ?Allergies and medications reviewed and updated. ? ?Review of Systems  ?Constitutional:  Negative for activity change, appetite change, diaphoresis, fatigue and fever.  ?Respiratory:  Negative for cough, chest tightness and shortness of breath.   ?Cardiovascular:  Negative for chest pain, palpitations and leg swelling.  ?Gastrointestinal: Negative.   ?Musculoskeletal:  Positive for arthralgias.  ?Neurological: Negative.   ?Psychiatric/Behavioral:  Negative.    ? ?Per HPI unless specifically indicated above ? ?   ?Objective:  ?  ?BP 140/79   Pulse 78   Temp 97.6 ?F (36.4 ?C) (Oral)   Wt 169 lb (76.7 kg)   LMP  (LMP Unknown)   SpO2 97%   BMI 31.93 kg/m?   ?Wt Readings from Last 3 Encounters:  ?12/02/21 169 lb (76.7 kg)  ?10/04/21 151 lb (68.5 kg)  ?08/04/21 176 lb (79.8 kg)  ?  ?Physical Exam ?Vitals and nursing note reviewed.  ?Constitutional:   ?   General: She is awake. She is not in acute distress. ?   Appearance: She is well-developed and well-groomed. She is obese. She is not ill-appearing or toxic-appearing.  ?HENT:  ?   Head: Normocephalic.  ?   Right Ear: Hearing normal.  ?   Left Ear: Hearing normal.  ?Eyes:  ?   General: Lids are normal.     ?   Right eye: No discharge.     ?   Left eye: No discharge.  ?   Conjunctiva/sclera: Conjunctivae normal.  ?   Pupils: Pupils are equal, round, and reactive to light.  ?Neck:  ?   Vascular: No carotid bruit.  ?Cardiovascular:  ?   Rate and Rhythm: Normal rate and regular rhythm.  ?   Heart sounds: Normal heart sounds. No murmur heard. ?  No gallop.  ?Pulmonary:  ?   Effort: Pulmonary effort is normal. No accessory muscle usage or respiratory distress.  ?   Breath sounds: Normal breath sounds.  ?Abdominal:  ?  General: Bowel sounds are normal.  ?   Palpations: Abdomen is soft. There is no hepatomegaly or splenomegaly.  ?Musculoskeletal:  ?   Right shoulder: Normal.  ?   Left shoulder: Tenderness (to lateral and posterior along rotator cuff aspect) and crepitus present. No swelling, effusion, laceration or bony tenderness. Decreased range of motion (decreased ROM throughout). Decreased strength.  ?   Cervical back: Normal range of motion and neck supple.  ?   Right lower leg: No edema.  ?   Left lower leg: No edema.  ?   Comments: Unable to lift arm up further then 50 degrees.  ?Skin: ?   General: Skin is warm and dry.  ?Neurological:  ?   Mental Status: She is alert and oriented to person, place, and time.   ?Psychiatric:     ?   Attention and Perception: Attention normal.     ?   Mood and Affect: Mood normal.     ?   Speech: Speech normal.     ?   Behavior: Behavior normal. Behavior is cooperative.     ?   Thought Content: Thought content normal.  ? ? ?Results for orders placed or performed in visit on 08/04/21  ?Urine Culture  ? Specimen: Urine  ? UR  ?Result Value Ref Range  ? Urine Culture, Routine Final report (A)   ? Organism ID, Bacteria Escherichia coli (A)   ? Antimicrobial Susceptibility Comment   ?Microscopic Examination  ? Urine  ?Result Value Ref Range  ? WBC, UA 11-30 (A) 0 - 5 /hpf  ? RBC 3-10 (A) 0 - 2 /hpf  ? Epithelial Cells (non renal) 0-10 0 - 10 /hpf  ? Mucus, UA Present (A) Not Estab.  ? Bacteria, UA Few (A) None seen/Few  ?Urinalysis, Routine w reflex microscopic  ?Result Value Ref Range  ? Specific Gravity, UA 1.025 1.005 - 1.030  ? pH, UA 5.5 5.0 - 7.5  ? Color, UA Yellow Yellow  ? Appearance Ur Cloudy (A) Clear  ? Leukocytes,UA 2+ (A) Negative  ? Protein,UA Trace (A) Negative/Trace  ? Glucose, UA Negative Negative  ? Ketones, UA Trace (A) Negative  ? RBC, UA 2+ (A) Negative  ? Bilirubin, UA Negative Negative  ? Urobilinogen, Ur 0.2 0.2 - 1.0 mg/dL  ? Nitrite, UA Positive (A) Negative  ? Microscopic Examination See below:   ? ?   ?Assessment & Plan:  ? ?Problem List Items Addressed This Visit   ? ?  ? Other  ? Acute pain of left shoulder - Primary  ?  Acute for 1 1/2 months with ongoing pain and decreased ROM.  At this time will obtain imagining of left shoulder -- ?shoulder impingement vs rotator cuff issue.  Scripts for steroid taper and Tizanidine sent (age >87).  Provided samples of Voltaren gel in office to utilize, recommend avoid Ibuprofen due to CKD.  Continue Tylenol as needed, may take 1000 MG TID as needed.  Referral to ortho placed.  Return in 4 weeks. ?  ?  ? Relevant Orders  ? Ambulatory referral to Orthopedics  ? DG Shoulder Left  ?  ? ?Follow up plan: ?Return in about 4 weeks  (around 12/30/2021) for Shoulder Pain. ? ? ? ? ? ?

## 2021-12-05 NOTE — Progress Notes (Signed)
Contacted via [place] ? ? ?Good morning Renee Bridges, your imaging returned noting no fracture, which is great news!! You do have some arthritis noted, but I do worry this is either frozen shoulder or rotator cuff related -- please ensure to schedule with ortho.  Any questions? ?Keep being stellar!!  Thank you for allowing me to participate in your care.  I appreciate you. ?Kindest regards, ?Orlander Norwood ?

## 2021-12-22 DIAGNOSIS — M25512 Pain in left shoulder: Secondary | ICD-10-CM | POA: Diagnosis not present

## 2021-12-22 DIAGNOSIS — M25561 Pain in right knee: Secondary | ICD-10-CM | POA: Diagnosis not present

## 2021-12-30 ENCOUNTER — Ambulatory Visit: Payer: Medicare Other | Admitting: Family Medicine

## 2022-01-04 ENCOUNTER — Telehealth: Payer: Medicare Other

## 2022-01-04 ENCOUNTER — Ambulatory Visit (INDEPENDENT_AMBULATORY_CARE_PROVIDER_SITE_OTHER): Payer: Medicare Other

## 2022-01-04 DIAGNOSIS — E782 Mixed hyperlipidemia: Secondary | ICD-10-CM

## 2022-01-04 DIAGNOSIS — M25512 Pain in left shoulder: Secondary | ICD-10-CM

## 2022-01-04 DIAGNOSIS — F419 Anxiety disorder, unspecified: Secondary | ICD-10-CM

## 2022-01-04 DIAGNOSIS — G8929 Other chronic pain: Secondary | ICD-10-CM

## 2022-01-04 DIAGNOSIS — F339 Major depressive disorder, recurrent, unspecified: Secondary | ICD-10-CM

## 2022-01-04 NOTE — Chronic Care Management (AMB) (Signed)
?Chronic Care Management  ? ?CCM RN Visit Note ? ?01/04/2022 ?Name: Renee Bridges MRN: [id number] DOB: 1946/08/12 ? ?Subjective: ?Renee Bridges is a 76 y.o. year old female who is a primary care patient of Valerie Roys, DO. The care management team was consulted for assistance with disease management and care coordination needs.   ? ?Engaged with patient by telephone for follow up visit in response to provider referral for case management and/or care coordination services.  ? ?Consent to Services:  ?The patient was given information about Chronic Care Management services, agreed to services, and gave verbal consent prior to initiation of services.  Please see initial visit note for detailed documentation.  ? ?Patient agreed to services and verbal consent obtained.  ? ?Assessment: Review of patient past medical history, allergies, medications, health status, including review of consultants reports, laboratory and other test data, was performed as part of comprehensive evaluation and provision of chronic care management services.  ? ?SDOH (Social Determinants of Health) assessments and interventions performed:   ? ?CCM Care Plan ? ?Allergies  ?Allergen Reactions  ? Bactrim [Sulfamethoxazole-Trimethoprim] Other (See Comments)  ?  Sweating, feeling like throat is closing up  ? Pollen Extract   ? Codeine Rash  ? ? ?Outpatient Encounter Medications as of 01/04/2022  ?Medication Sig  ? atorvastatin (LIPITOR) 20 MG tablet Take 1 tablet (20 mg total) by mouth daily.  ? methylPREDNISolone (MEDROL DOSEPAK) 4 MG TBPK tablet Take as directed on pack.  ? nystatin (MYCOSTATIN/NYSTOP) powder Apply 1 application. topically 3 (three) times daily.  ? tiZANidine (ZANAFLEX) 2 MG tablet Take 1 tablet (2 mg total) by mouth every 6 (six) hours as needed for muscle spasms.  ? ?No facility-administered encounter medications on file as of 01/04/2022.  ? ? ?Patient Active Problem List  ? Diagnosis Date Noted  ? Acute pain of left shoulder  12/02/2021  ? Driving safety issue 10/04/2020  ? Hyperlipidemia 04/03/2018  ? Mental status, decreased 12/28/2017  ? Depression, recurrent ([place]) 10/16/2017  ? Advanced care planning/counseling discussion 06/01/2017  ? Chronic kidney disease, stage 3 ([place]) 03/21/2017  ? Chronic back pain 09/25/2016  ? Cyst of right kidney 07/17/2016  ? Benign hypertensive renal disease 12/27/2015  ? Failed total left knee replacement ([place]) 05/13/2015  ? Anxiety 03/25/2015  ? Urge incontinence 06/30/2013  ? ? ?Conditions to be addressed/monitored:HLD, Anxiety, Depression, and Chronic pain  ? ?Care Plan : RNCM: General Plan of Care (Adult) for Chronic Disease Management and Care Coordination Needs  ?Updates made by Vanita Ingles, RN since 01/04/2022 12:00 AM  ?  ? ?Problem: RNCM: Development of plan of care for Chronic Disease Management and Care Coordination Needs (anxiety, depression, HLD, chronic pain )   ?Priority: High  ?  ? ?Long-Range Goal: Effective management and support for Chronic Diseases (Anxiety, Depression, HLD, chronic pain )   ?Start Date: 06/24/2021  ?Expected End Date: 06/24/2022  ?Priority: High  ?Note:   ?Current Barriers:  ?Knowledge Deficits related to plan of care for management of HLD and Anxiety with Excessive Worry, ?Social Anxiety, and Depression: depressed mood, chronic pain  ?anxiety  ?Care Coordination needs related to Limited social support, Transportation, and Mental Health Concerns   ?Chronic Disease Management support and education needs related to HLD and Anxiety with Excessive Worry, ?Social Anxiety, and Depression: depressed mood ?Anxiety, chronic pain  ?Lacks caregiver support.  ?Transportation barriers ? ?RNCM Clinical Goal(s):  ?Patient will verbalize understanding of plan for management of HLD, Anxiety,  and Depression  ?verbalize basic understanding of HLD, Anxiety, Chronic pain, and Depression disease process and self health management plan  ?take all medications exactly as prescribed and will  call provider for medication related questions ?demonstrate improved and ongoing adherence to prescribed treatment plan for HLD, Chronic pain, Anxiety, and Depression as evidenced by adherence to prescribed medication regimen contacting provider for new or worsened symptoms or questions  ?demonstrate improved and ongoing health management independence  ?continue to work with Consulting civil engineer and/or Social Worker to address care management and care coordination needs related to HLD, Anxiety, chronic pain, and Depression  ?demonstrate a decrease in HLD, Anxiety, Chronic pain, and Depression exacerbations  ?demonstrate ongoing self health care management ability for effective management of chronic diseases through collaboration with RN Care manager, provider, and care team.  ? ?Interventions: ?1:1 collaboration with primary care provider regarding development and update of comprehensive plan of care as evidenced by provider attestation and co-signature ?Inter-disciplinary care team collaboration (see longitudinal plan of care) ?Evaluation of current treatment plan related to  self management and patient's adherence to plan as established by provider ? ? ?SDOH Barriers (Status: Goal on track: YES.)  ?Patient interviewed and SDOH assessment performed ?       ?SDOH Interventions   ? ?Flowsheet Row Most Recent Value  ?SDOH Interventions   ?Food Insecurity Interventions Intervention Not Indicated  ?Financial Strain Interventions Other (Comment)  [fixed income, granddaughter helps her with management of her bills]  ?Housing Interventions Intervention Not Indicated  ?Intimate Partner Violence Interventions Intervention Not Indicated  ?Physical Activity Interventions Other (Comments)  [no structured activity]  ?Stress Interventions Intervention Not Indicated  ?Social Connections Interventions Other (Comment)  [Has support from her family. Gets lonely, support given]  ?Transportation Interventions Other (Comment)  [patient has  resources and most of the time her granddaughter arranges her schedule so she can take the patient to appointments]  ? ?  ?Provided patient with information about resources to help with needs in the community  ?Discussed plans with patient for ongoing care management follow up and provided patient with direct contact information for care management team ?Advised patient to call the office for changes in SDOH, questions, or needs ? ? ? ?Anxiety and Depression   (Status: Goal on track: YES.) ?Evaluation of current treatment plan related to Anxiety and Depression, Limited social support, Transportation, and Mental Health Concerns  self-management and patient's adherence to plan as established by provider. 08-31-2021: The patient is always sad this time of year because it is the month that her daughter died but the granddaughter states that she is doing fairly well and the family has spent time with her for Christmas. The granddaughter did mention that the patient is hoarding and having a lot of stuff in her house. She states she picks things up that other people put out that is trash. She states that she thinks she hurt herself because recently the patient got a big mirror from the curb that would go on a dresser and she can tell she is hurting and in pain. She is concerned about the condition of the patients apartment. Education and support given. 11-02-2021: The patient is doing better since getting past Christmas and her daughters who is deceased birthday. She is stable and her granddaughter states she still has her moments but overall is doing well. Will continue to monitor for changes. 01-04-2022: The patients granddaughter states that the patient overall is doing well. She is a little stressed out  because they told her her apartment was messy and a fire hazard and she needed to get rid of some things. [name] states that her grandmother is a Ship broker and her and her husband are actually going to go over there and  clean out some of the things that she has. She says other than that she is doing well.  ?Discussed plans with patient for ongoing care management follow up and provided patient with direct contact information fo

## 2022-01-04 NOTE — Patient Instructions (Signed)
Visit Information ? ?Thank you for taking time to visit with me today. Please don't hesitate to contact me if I can be of assistance to you before our next scheduled telephone appointment. ? ?Following are the goals we discussed today:  ?RNCM Clinical Goal(s):  ?Patient will verbalize understanding of plan for management of HLD, Anxiety, and Depression  ?verbalize basic understanding of HLD, Anxiety, Chronic pain, and Depression disease process and self health management plan  ?take all medications exactly as prescribed and will call provider for medication related questions ?demonstrate improved and ongoing adherence to prescribed treatment plan for HLD, Chronic pain, Anxiety, and Depression as evidenced by adherence to prescribed medication regimen contacting provider for new or worsened symptoms or questions  ?demonstrate improved and ongoing health management independence  ?continue to work with Medical illustrator and/or Social Worker to address care management and care coordination needs related to HLD, Anxiety, chronic pain, and Depression  ?demonstrate a decrease in HLD, Anxiety, Chronic pain, and Depression exacerbations  ?demonstrate ongoing self health care management ability for effective management of chronic diseases through collaboration with RN Care manager, provider, and care team.  ?  ?Interventions: ?1:1 collaboration with primary care provider regarding development and update of comprehensive plan of care as evidenced by provider attestation and co-signature ?Inter-disciplinary care team collaboration (see longitudinal plan of care) ?Evaluation of current treatment plan related to  self management and patient's adherence to plan as established by provider ?  ?  ?SDOH Barriers (Status: Goal on track: YES.)  ?Patient interviewed and SDOH assessment performed ?       ?SDOH Interventions   ?  ?Flowsheet Row Most Recent Value  ?SDOH Interventions    ?Food Insecurity Interventions Intervention Not  Indicated  ?Financial Strain Interventions Other (Comment)  [fixed income, granddaughter helps her with management of her bills]  ?Housing Interventions Intervention Not Indicated  ?Intimate Partner Violence Interventions Intervention Not Indicated  ?Physical Activity Interventions Other (Comments)  [no structured activity]  ?Stress Interventions Intervention Not Indicated  ?Social Connections Interventions Other (Comment)  [Has support from her family. Gets lonely, support given]  ?Transportation Interventions Other (Comment)  [patient has resources and most of the time her granddaughter arranges her schedule so she can take the patient to appointments]  ?  ?   ?Provided patient with information about resources to help with needs in the community  ?Discussed plans with patient for ongoing care management follow up and provided patient with direct contact information for care management team ?Advised patient to call the office for changes in SDOH, questions, or needs ?  ?  ?  ?Anxiety and Depression   (Status: Goal on track: YES.) ?Evaluation of current treatment plan related to Anxiety and Depression, Limited social support, Transportation, and Mental Health Concerns  self-management and patient's adherence to plan as established by provider. 08-31-2021: The patient is always sad this time of year because it is the month that her daughter died but the granddaughter states that she is doing fairly well and the family has spent time with her for Christmas. The granddaughter did mention that the patient is hoarding and having a lot of stuff in her house. She states she picks things up that other people put out that is trash. She states that she thinks she hurt herself because recently the patient got a big mirror from the curb that would go on a dresser and she can tell she is hurting and in pain. She is concerned about the condition  of the patients apartment. Education and support given. 11-02-2021: The patient is  doing better since getting past Christmas and her daughters who is deceased birthday. She is stable and her granddaughter states she still has her moments but overall is doing well. Will continue to monitor for changes. 01-04-2022: The patients granddaughter states that the patient overall is doing well. She is a little stressed out because they told her her apartment was messy and a fire hazard and she needed to get rid of some things. Shanda BumpsJessica states that her grandmother is a Chartered loss adjusterhoarder and her and her husband are actually going to go over there and clean out some of the things that she has. She says other than that she is doing well.  ?Discussed plans with patient for ongoing care management follow up and provided patient with direct contact information for care management team ?Advised patient to call the office for changes in mood, anxiety, depression; ?Provided education to patient re: effective management of depression and anxiety, working with the CCM team to meet mental health needs, doing things the patient enjoys doing, talk to others about how she is feeling when she is down; ?Reviewed medications with patient and discussed compliance. 01-04-2022:The patient is not always compliant with medications. Her granddaughter encourages her to take medications on a conistent basis. ; ?Discussed plans with patient for ongoing care management follow up and provided patient with direct contact information for care management team; ?  ?Hyperlipidemia:  (Status: Goal on track: NO.) ?     ?Lab Results  ?Component Value Date  ?  CHOL 208 (H) 10/04/2020  ?  HDL 41 10/04/2020  ?  LDLCALC 141 (H) 10/04/2020  ?  TRIG 146 10/04/2020  ?  ?  ?Medication review performed; medication list updated in electronic medical record. 01-04-2022: Is not consistently taking anything for HLD management. The pcp has ordered the patient to take Lipitor 20 mg QD. The patient has a known history of non-compliance with medications.  ?Provider established  cholesterol goals reviewed; ?Counseled on importance of regular laboratory monitoring as prescribed. 11-02-2021: Reminded the patients granddaughter to call the office to schedule an appointment for patient to be seen for evaluation of labs and new lab work possibly; ?Provided HLD educational materials; ?Reviewed role and benefits of statin for ASCVD risk reduction; ?Discussed strategies to manage statin-induced myalgias; ?Reviewed importance of limiting foods high in cholesterol. 11-02-2021: Review and education done. The patients granddaughter states that she has been sick with a "stomach issue". She thinks it has been going through her apartment complex. The granddaughter feels it is sometimes things she eats as well. 01-04-2022: Denies any issues with stomach health. Will continue to monitor for changes. Shanda BumpsJessica knows how to [facility] for assistance and needs.  ?  ?  ?Pain:  (Status: Goal on track: NO.) Long Term Goal Added on 08-31-2021 ?Pain assessment performed. 08-31-2021: The patients granddaughter states the patient tells her that her knees are hurting. She is walking fine but she states when she is getting in and out of the granddaughters car or sitting that she is complaining about her knees hurting. Unable to get a number of her pain level. The granddaughter thinks she hurt herself when she picked up a large Forensic scientistdresser mirror from the curb. She has noticed her grimacing when she is with her. Education on getting and appointment to see pcp or taking her to [facility] for after hours support. The granddaughter states she has several appointments coming up  for her children and she would need to come in as late as possible to see the pcp. Education on the granddaughter calling the office and seeing if she could get a late appointment with Dr. Laural Benes. 11-02-2021: Pain is better and she denies any issues related to pain. She is still in need of a shower chair but the insurance is lagging with getting it.  Discussed options with the patients granddaughter today for obtaining a shower chair for the patient. 01-04-2022: The patient was having some left shoulder pain and she went to see the pcp and got injections. Her shoulder

## 2022-01-20 ENCOUNTER — Ambulatory Visit: Payer: Self-pay | Admitting: *Deleted

## 2022-01-20 MED ORDER — TIZANIDINE HCL 4 MG PO TABS
4.0000 mg | ORAL_TABLET | Freq: Three times a day (TID) | ORAL | 1 refills | Status: DC | PRN
Start: 2022-01-20 — End: 2022-04-14

## 2022-01-20 NOTE — Telephone Encounter (Signed)
Was last seen in March for shoulder pain. Needs appointment.

## 2022-01-20 NOTE — Addendum Note (Signed)
Addended by: [name] on: 01/20/2022 12:59 PM   Modules accepted: Orders

## 2022-01-20 NOTE — Telephone Encounter (Signed)
Summary: muscle spasms   Pt called in stating she was recently seen about muscles spasms in her leg, and was given some medication but it doesn't work and wanted to speak with someone about what she can further, please advise.     Attempted to call patient- left message to call office on VM

## 2022-01-20 NOTE — Telephone Encounter (Signed)
LMOM advising patient that Dr. Laural Benes states she must have an appointment to receive medication for muscle spasms. Requested patient call back to schedule appointment.

## 2022-01-20 NOTE — Telephone Encounter (Signed)
  Chief Complaint: Muscle cramps Symptoms: Nightly muscle cramps. Frequency: unknown Pertinent Negatives: Patient denies  Disposition: [] ED /[] Urgent Care (no appt availability in office) / [x] Appointment(In office/virtual)/ []  [facility]/ [] Home Care/ [] Refused Recommended Disposition /[] [facility]/ []  Follow-up with PCP Additional Notes: Pt is very hard of hearing. She states that she needs 3 or 5 days to get transportation to come into the office. Pt is very hard of hearing and it is difficult to communicate with her. Pt stated at first that " won't have anything to do with her", but later changed it to " is very busy with her babies, and can't help".   PT refused ov d/t lack of transportation. Pt will come in for appointment when transportation can be arranged.  Called pt again - pt is no longer taking statin. Pt put in her hearing aid - communication improved somewhat.   Pt would like a medication called in forher muscle spasms.        Summary: muscle spasms   Pt called in stating she was recently seen about muscles spasms in her leg, and was given some medication but it doesn't work and wanted to speak with someone about what she can further, please advise.      Reason for Disposition  [1] MILD or MODERATE muscle aches or pain AND [2] taking a statin medicine (a lipid or cholesterol lowering drug)  Answer Assessment - Initial Assessment Questions 1. ONSET: "When did the muscle aches or body pains start?"      Unsure - Pt could not hear well.  2. LOCATION: "What part of your body is hurting?" (e.g., entire body, arms, legs)      Both legs - nightly muscle spasams 3. SEVERITY: "How bad is the pain?" (Scale 1-10; or mild, moderate, severe)   - MILD (1-3): doesn't interfere with normal activities    - MODERATE (4-7): interferes with normal activities or awakens from sleep    - SEVERE (8-10):  excruciating pain, unable to do any normal  activities      Crying over pain 4. CAUSE: "What do you think is causing the pains?"     Muscle cramps 5. FEVER: "Have you been having fever?"     Unsure - has been sweating at night 6. OTHER SYMPTOMS: "Do you have any other symptoms?" (e.g., chest pain, weakness, rash, cold or flu symptoms, weight loss)     unknown 7. PREGNANCY: "Is there any chance you are pregnant?" "When was your last menstrual period?"     na 8. TRAVEL: "Have you traveled out of the country in the last month?" (e.g., travel history, exposures)     unknown  Protocols used: Muscle Aches and Body Pain-A-AH

## 2022-01-20 NOTE — Telephone Encounter (Signed)
Has not had regular follow up in more than a year. Please get her scheduled for regular follow up

## 2022-01-20 NOTE — Telephone Encounter (Signed)
Summary: muscle spasms   Pt called in stating she was recently seen about muscles spasms in her leg, and was given some medication but it doesn't work and wanted to speak with someone about what she can further, please advise.     Called pt  - LMOM

## 2022-02-01 DIAGNOSIS — E785 Hyperlipidemia, unspecified: Secondary | ICD-10-CM

## 2022-02-01 DIAGNOSIS — F418 Other specified anxiety disorders: Secondary | ICD-10-CM

## 2022-02-01 DIAGNOSIS — F32A Depression, unspecified: Secondary | ICD-10-CM

## 2022-03-10 ENCOUNTER — Ambulatory Visit (INDEPENDENT_AMBULATORY_CARE_PROVIDER_SITE_OTHER): Payer: Medicare Other

## 2022-03-10 DIAGNOSIS — E782 Mixed hyperlipidemia: Secondary | ICD-10-CM

## 2022-03-10 DIAGNOSIS — F419 Anxiety disorder, unspecified: Secondary | ICD-10-CM

## 2022-03-10 DIAGNOSIS — G8929 Other chronic pain: Secondary | ICD-10-CM

## 2022-03-10 DIAGNOSIS — F339 Major depressive disorder, recurrent, unspecified: Secondary | ICD-10-CM

## 2022-03-10 NOTE — Patient Instructions (Addendum)
Visit Information  Thank you for taking time to visit with me today. Please don't hesitate to contact me if I can be of assistance to you before our next scheduled telephone appointment.  Following are the goals we discussed today:  Current Barriers: 03-10-2022: Goals met and care plan is being closed  Knowledge Deficits related to plan of care for management of HLD and Anxiety with Excessive Worry, Social Anxiety, and Depression: depressed mood, chronic pain  anxiety  Care Coordination needs related to Limited social support, Transportation, and Mental Health Concerns   Chronic Disease Management support and education needs related to HLD and Anxiety with Excessive Worry, Social Anxiety, and Depression: depressed mood Anxiety, chronic pain  Lacks caregiver support.  Transportation barriers   RNCM Clinical Goal(s):  Patient will verbalize understanding of plan for management of HLD, Anxiety, and Depression  verbalize basic understanding of HLD, Anxiety, Chronic pain, and Depression disease process and self health management plan  take all medications exactly as prescribed and will call provider for medication related questions demonstrate improved and ongoing adherence to prescribed treatment plan for HLD, Chronic pain, Anxiety, and Depression as evidenced by adherence to prescribed medication regimen contacting provider for new or worsened symptoms or questions  demonstrate improved and ongoing health management independence  continue to work with Consulting civil engineer and/or Social Worker to address care management and care coordination needs related to HLD, Anxiety, chronic pain, and Depression  demonstrate a decrease in HLD, Anxiety, Chronic pain, and Depression exacerbations  demonstrate ongoing self health care management ability for effective management of chronic diseases through collaboration with RN Care manager, provider, and care team.    Interventions: 1:1 collaboration with primary  care provider regarding development and update of comprehensive plan of care as evidenced by provider attestation and co-signature Inter-disciplinary care team collaboration (see longitudinal plan of care) Evaluation of current treatment plan related to  self management and patient's adherence to plan as established by provider     SDOH Barriers (Status: Goal Met.) 03-10-2022: Goals met and care plan is being closed  Patient interviewed and SDOH assessment performed        SDOH Interventions     Flowsheet Row Most Recent Value  SDOH Interventions    Food Insecurity Interventions Intervention Not Indicated  Financial Strain Interventions Other (Comment)  [fixed income, granddaughter helps her with management of her bills]  Housing Interventions Intervention Not Indicated  Intimate Partner Violence Interventions Intervention Not Indicated  Physical Activity Interventions Other (Comments)  [no structured activity]  Stress Interventions Intervention Not Indicated  Social Connections Interventions Other (Comment)  [Has support from her family. Gets lonely, support given]  Transportation Interventions Other (Comment)  [patient has resources and most of the time her granddaughter arranges her schedule so she can take the patient to appointments]       Provided patient with information about resources to help with needs in the community  Discussed plans with patient for ongoing care management follow up and provided patient with direct contact information for care management team Advised patient to call the office for changes in [place], questions, or needs       Anxiety and Depression   (Status: Goal Met.)03-10-2022: Goals met and care plan is being closed  Evaluation of current treatment plan related to Anxiety and Depression, Limited social support, Transportation, and Mental Health Concerns  self-management and patient's adherence to plan as established by provider. 08-31-2021: The patient is  always sad this time of year  because it is the month that her daughter died but the granddaughter states that she is doing fairly well and the family has spent time with her for Christmas. The granddaughter did mention that the patient is hoarding and having a lot of stuff in her house. She states she picks things up that other people put out that is trash. She states that she thinks she hurt herself because recently the patient got a big mirror from the curb that would go on a dresser and she can tell she is hurting and in pain. She is concerned about the condition of the patients apartment. Education and support given. 11-02-2021: The patient is doing better since getting past Christmas and her daughters who is deceased birthday. She is stable and her granddaughter states she still has her moments but overall is doing well. Will continue to monitor for changes. 01-04-2022: The patients granddaughter states that the patient overall is doing well. She is a little stressed out because they told her her apartment was messy and a fire hazard and she needed to get rid of some things. [name] states that her grandmother is a Ship broker and her and her husband are actually going to go over there and clean out some of the things that she has. She says other than that she is doing well.  Discussed plans with patient for ongoing care management follow up and provided patient with direct contact information for care management team Advised patient to call the office for changes in mood, anxiety, depression; Provided education to patient re: effective management of depression and anxiety, working with the CCM team to meet mental health needs, doing things the patient enjoys doing, talk to others about how she is feeling when she is down; Reviewed medications with patient and discussed compliance. 01-04-2022:The patient is not always compliant with medications. Her granddaughter encourages her to take medications on a conistent  basis. ; Discussed plans with patient for ongoing care management follow up and provided patient with direct contact information for care management team;   Hyperlipidemia:  (Status: Goal Met.)03-10-2022: Goals met and care plan is being closed       Lab Results  Component Value Date    CHOL 208 (H) 10/04/2020    HDL 41 10/04/2020    LDLCALC 141 (H) 10/04/2020    TRIG 146 10/04/2020      Medication review performed; medication list updated in electronic medical record. 01-04-2022: Is not consistently taking anything for HLD management. The pcp has ordered the patient to take Lipitor 20 mg QD. The patient has a known history of non-compliance with medications.  Provider established cholesterol goals reviewed; Counseled on importance of regular laboratory monitoring as prescribed. 11-02-2021: Reminded the patients granddaughter to call the office to schedule an appointment for patient to be seen for evaluation of labs and new lab work possibly; Provided HLD educational materials; Reviewed role and benefits of statin for ASCVD risk reduction; Discussed strategies to manage statin-induced myalgias; Reviewed importance of limiting foods high in cholesterol. 11-02-2021: Review and education done. The patients granddaughter states that she has been sick with a "stomach issue". She thinks it has been going through her apartment complex. The granddaughter feels it is sometimes things she eats as well. 01-04-2022: Denies any issues with stomach health. Will continue to monitor for changes. [name] knows how to reach the [facility] for assistance and needs.      Pain:  (Status: Goal Met.) Long Term Goal Added on 08-31-2021 03-10-2022: Goals  met and care plan is being closed- the patient is being managed well at this time. She has no new concerns. The patient has met the goals of care. Care plan being closed. Pain assessment performed. 08-31-2021: The patients granddaughter states the patient tells her that her knees are  hurting. She is walking fine but she states when she is getting in and out of the granddaughters car or sitting that she is complaining about her knees hurting. Unable to get a number of her pain level. The granddaughter thinks she hurt herself when she picked up a large Patent attorney from the curb. She has noticed her grimacing when she is with her. Education on getting and appointment to see pcp or taking her to [place] for after hours support. The granddaughter states she has several appointments coming up for her children and she would need to come in as late as possible to see the pcp. Education on the granddaughter calling the office and seeing if she could get a late appointment with Dr. Wynetta Emery. 11-02-2021: Pain is better and she denies any issues related to pain. She is still in need of a shower chair but the insurance is lagging with getting it. Discussed options with the patients granddaughter today for obtaining a shower chair for the patient. 01-04-2022: The patient was having some left shoulder pain and she went to see the pcp and got injections. Her shoulder pain has resolved. They are working with her on her knee pain too. [name] states a lot of it is due to her moving furniture and stuff around in her home. Education and support given.  Medications reviewed Reviewed provider established plan for pain management; Discussed importance of adherence to all scheduled medical appointments. 01-04-2022: Saw pcp in March. Is following up with [facility] for new issues with shoulder pain and chronic knee pain.   Counseled on the importance of reporting any/all new or changed pain symptoms or management strategies to pain management provider; Advised patient to report to care team affect of pain on daily activities; Discussed use of relaxation techniques and/or diversional activities to assist with pain reduction (distraction, imagery, relaxation, massage, acupressure, TENS, heat, and cold  application; Reviewed with patient prescribed pharmacological and nonpharmacological pain relief strategies; Advised patient to discuss unresolved pain, changes in level or intensity of pain with provider; Screening for signs and symptoms of depression related to chronic disease state;  Assessed social determinant of health barriers;   Review of UTI sx and sx. The patients granddaughter states there has been resolution in UTI sx and sx  No further follow up required at this time. The patient is stable and has no new concerns. The patients granddaughter knows to call the [facility] for new concerns or needs.  Please call the care guide team at [phone] if you need to schedule an appointment.   If you are experiencing a Mental Health or [employer] or need someone to talk to, please call the Suicide and Crisis Lifeline: 988 call the [place] National Suicide Prevention Lifeline: (575)608-7810 or TTY: (240)327-6008 TTY 3374308799) to talk to a trained counselor call 1-800-273-TALK (toll free, 24 hour hotline)   Patient verbalizes understanding of instructions and care plan provided today and agrees to view in [place]. Active MyChart status and patient understanding of how to access instructions and care plan via MyChart confirmed with patient.     [name] RN, MSN, [facility]  Mobile: [phone]

## 2022-03-10 NOTE — Chronic Care Management (AMB) (Signed)
Chronic Care Management   CCM RN Visit Note  03/10/2022 Name: Renee Bridges MRN: [id number] DOB: 07-31-1946  Subjective: Renee Bridges is a 76 y.o. year old female who is a primary care patient of [name], DO. The care management team was consulted for assistance with disease management and care coordination needs.    Engaged with patient by telephone for follow up visit in response to provider referral for case management and/or care coordination services. Spoke to the patients granddaughter, [name] who is DRP. The patients care goals have been met and the plan of care has been closed at this time. The granddaughter knows to call for changes or new needs  Consent to Services:  The patient was given information about Chronic Care Management services, agreed to services, and gave verbal consent prior to initiation of services.  Please see initial visit note for detailed documentation.   Patient agreed to services and verbal consent obtained.   Assessment: Review of patient past medical history, allergies, medications, health status, including review of consultants reports, laboratory and other test data, was performed as part of comprehensive evaluation and provision of chronic care management services.   SDOH (Social Determinants of Health) assessments and interventions performed:    CCM Care Plan  Allergies  Allergen Reactions   Bactrim [Sulfamethoxazole-Trimethoprim] Other (See Comments)    Sweating, feeling like throat is closing up   Pollen Extract    Codeine Rash    Outpatient Encounter Medications as of 03/10/2022  Medication Sig   atorvastatin (LIPITOR) 20 MG tablet Take 1 tablet (20 mg total) by mouth daily.   methylPREDNISolone (MEDROL DOSEPAK) 4 MG TBPK tablet Take as directed on pack.   nystatin (MYCOSTATIN/NYSTOP) powder Apply 1 application. topically 3 (three) times daily.   tiZANidine (ZANAFLEX) 4 MG tablet Take 1 tablet (4 mg total) by mouth every 8 (eight)  hours as needed for muscle spasms.   No facility-administered encounter medications on file as of 03/10/2022.    Patient Active Problem List   Diagnosis Date Noted   Acute pain of left shoulder 12/02/2021   Driving safety issue 52/04/3528   Hyperlipidemia 04/03/2018   Mental status, decreased 12/28/2017   Depression, recurrent (HCC) 10/16/2017   Advanced care planning/counseling discussion 06/01/2017   Chronic kidney disease, stage 3 (HCC) 03/21/2017   Chronic back pain 09/25/2016   Cyst of right kidney 07/17/2016   Benign hypertensive renal disease 12/27/2015   Failed total left knee replacement (HCC) 05/13/2015   Anxiety 03/25/2015   Urge incontinence 06/30/2013    Conditions to be addressed/monitored:HLD, Anxiety, Depression, and Chronic pain   Care Plan : RNCM: General Plan of Care (Adult) for Chronic Disease Management and Care Coordination Needs  Updates made by Marlowe Sax, RN since 03/10/2022 12:00 AM  Completed 03/10/2022   Problem: RNCM: Development of plan of care for Chronic Disease Management and Care Coordination Needs (anxiety, depression, HLD, chronic pain ) Resolved 03/10/2022  Priority: High     Long-Range Goal: Effective management and support for Chronic Diseases (Anxiety, Depression, HLD, chronic pain ) Completed 03/10/2022  Start Date: 06/24/2021  Expected End Date: 06/24/2022  Priority: High  Note:   Current Barriers: 03-10-2022: Goals met and care plan is being closed  Knowledge Deficits related to plan of care for management of HLD and Anxiety with Excessive Worry, Social Anxiety, and Depression: depressed mood, chronic pain  anxiety  Care Coordination needs related to Limited social support, Transportation, and Mental Health Concerns  Chronic Disease Management support and education needs related to HLD and Anxiety with Excessive Worry, Social Anxiety, and Depression: depressed mood Anxiety, chronic pain  Lacks caregiver support.  Transportation  barriers  RNCM Clinical Goal(s):  Patient will verbalize understanding of plan for management of HLD, Anxiety, and Depression  verbalize basic understanding of HLD, Anxiety, Chronic pain, and Depression disease process and self health management plan  take all medications exactly as prescribed and will call provider for medication related questions demonstrate improved and ongoing adherence to prescribed treatment plan for HLD, Chronic pain, Anxiety, and Depression as evidenced by adherence to prescribed medication regimen contacting provider for new or worsened symptoms or questions  demonstrate improved and ongoing health management independence  continue to work with Consulting civil engineer and/or Social Worker to address care management and care coordination needs related to HLD, Anxiety, chronic pain, and Depression  demonstrate a decrease in HLD, Anxiety, Chronic pain, and Depression exacerbations  demonstrate ongoing self health care management ability for effective management of chronic diseases through collaboration with RN Care manager, provider, and care team.   Interventions: 1:1 collaboration with primary care provider regarding development and update of comprehensive plan of care as evidenced by provider attestation and co-signature Inter-disciplinary care team collaboration (see longitudinal plan of care) Evaluation of current treatment plan related to  self management and patient's adherence to plan as established by provider   SDOH Barriers (Status: Goal Met.) 03-10-2022: Goals met and care plan is being closed  Patient interviewed and SDOH assessment performed        SDOH Interventions    Flowsheet Row Most Recent Value  SDOH Interventions   Food Insecurity Interventions Intervention Not Indicated  Financial Strain Interventions Other (Comment)  [fixed income, granddaughter helps her with management of her bills]  Housing Interventions Intervention Not Indicated  Intimate Partner  Violence Interventions Intervention Not Indicated  Physical Activity Interventions Other (Comments)  [no structured activity]  Stress Interventions Intervention Not Indicated  Social Connections Interventions Other (Comment)  [Has support from her family. Gets lonely, support given]  Transportation Interventions Other (Comment)  [patient has resources and most of the time her granddaughter arranges her schedule so she can take the patient to appointments]     Provided patient with information about resources to help with needs in the community  Discussed plans with patient for ongoing care management follow up and provided patient with direct contact information for care management team Advised patient to call the office for changes in [place], questions, or needs    Anxiety and Depression   (Status: Goal Met.)03-10-2022: Goals met and care plan is being closed  Evaluation of current treatment plan related to Anxiety and Depression, Limited social support, Transportation, and Mental Health Concerns  self-management and patient's adherence to plan as established by provider. 08-31-2021: The patient is always sad this time of year because it is the month that her daughter died but the granddaughter states that she is doing fairly well and the family has spent time with her for Christmas. The granddaughter did mention that the patient is hoarding and having a lot of stuff in her house. She states she picks things up that other people put out that is trash. She states that she thinks she hurt herself because recently the patient got a big mirror from the curb that would go on a dresser and she can tell she is hurting and in pain. She is concerned about the condition of the patients apartment. Education  and support given. 11-02-2021: The patient is doing better since getting past Christmas and her daughters who is deceased birthday. She is stable and her granddaughter states she still has her moments but overall  is doing well. Will continue to monitor for changes. 01-04-2022: The patients granddaughter states that the patient overall is doing well. She is a little stressed out because they told her her apartment was messy and a fire hazard and she needed to get rid of some things. [name] states that her grandmother is a Ship broker and her and her husband are actually going to go over there and clean out some of the things that she has. She says other than that she is doing well.  Discussed plans with patient for ongoing care management follow up and provided patient with direct contact information for care management team Advised patient to call the office for changes in mood, anxiety, depression; Provided education to patient re: effective management of depression and anxiety, working with the CCM team to meet mental health needs, doing things the patient enjoys doing, talk to others about how she is feeling when she is down; Reviewed medications with patient and discussed compliance. 01-04-2022:The patient is not always compliant with medications. Her granddaughter encourages her to take medications on a conistent basis. ; Discussed plans with patient for ongoing care management follow up and provided patient with direct contact information for care management team;  Hyperlipidemia:  (Status: Goal Met.)03-10-2022: Goals met and care plan is being closed  Lab Results  Component Value Date   CHOL 208 (H) 10/04/2020   HDL 41 10/04/2020   LDLCALC 141 (H) 10/04/2020   TRIG 146 10/04/2020     Medication review performed; medication list updated in electronic medical record. 01-04-2022: Is not consistently taking anything for HLD management. The pcp has ordered the patient to take Lipitor 20 mg QD. The patient has a known history of non-compliance with medications.  Provider established cholesterol goals reviewed; Counseled on importance of regular laboratory monitoring as prescribed. 11-02-2021: Reminded the patients  granddaughter to call the office to schedule an appointment for patient to be seen for evaluation of labs and new lab work possibly; Provided HLD educational materials; Reviewed role and benefits of statin for ASCVD risk reduction; Discussed strategies to manage statin-induced myalgias; Reviewed importance of limiting foods high in cholesterol. 11-02-2021: Review and education done. The patients granddaughter states that she has been sick with a "stomach issue". She thinks it has been going through her apartment complex. The granddaughter feels it is sometimes things she eats as well. 01-04-2022: Denies any issues with stomach health. Will continue to monitor for changes. [name] knows how to reach the [facility] for assistance and needs.    Pain:  (Status: Goal Met.) Long Term Goal Added on 08-31-2021 03-10-2022: Goals met and care plan is being closed- the patient is being managed well at this time. She has no new concerns. The patient has met the goals of care. Care plan being closed. Pain assessment performed. 08-31-2021: The patients granddaughter states the patient tells her that her knees are hurting. She is walking fine but she states when she is getting in and out of the granddaughters car or sitting that she is complaining about her knees hurting. Unable to get a number of her pain level. The granddaughter thinks she hurt herself when she picked up a large Patent attorney from the curb. She has noticed her grimacing when she is with her. Education on getting and appointment  to see pcp or taking her to [facility] for after hours support. The granddaughter states she has several appointments coming up for her children and she would need to come in as late as possible to see the pcp. Education on the granddaughter calling the office and seeing if she could get a late appointment with Dr. Wynetta Emery. 11-02-2021: Pain is better and she denies any issues related to pain. She is still in need of a shower chair but the  insurance is lagging with getting it. Discussed options with the patients granddaughter today for obtaining a shower chair for the patient. 01-04-2022: The patient was having some left shoulder pain and she went to see the pcp and got injections. Her shoulder pain has resolved. They are working with her on her knee pain too. [name] states a lot of it is due to her moving furniture and stuff around in her home. Education and support given.  Medications reviewed Reviewed provider established plan for pain management; Discussed importance of adherence to all scheduled medical appointments. 01-04-2022: Saw pcp in March. Is following up with [facility] for new issues with shoulder pain and chronic knee pain.   Counseled on the importance of reporting any/all new or changed pain symptoms or management strategies to pain management provider; Advised patient to report to care team affect of pain on daily activities; Discussed use of relaxation techniques and/or diversional activities to assist with pain reduction (distraction, imagery, relaxation, massage, acupressure, TENS, heat, and cold application; Reviewed with patient prescribed pharmacological and nonpharmacological pain relief strategies; Advised patient to discuss unresolved pain, changes in level or intensity of pain with provider; Screening for signs and symptoms of depression related to chronic disease state;  Assessed social determinant of health barriers;   Review of UTI sx and sx. The patients granddaughter states there has been resolution in UTI sx and sx  Patient Goals/Self-Care Activities: Patient will self administer medications as prescribed as evidenced by self report/primary caregiver report  Patient will attend all scheduled provider appointments as evidenced by clinician review of documented attendance to scheduled appointments and patient/caregiver report Patient will call pharmacy for medication refills as evidenced by patient report  and review of pharmacy fill history as appropriate Patient will attend church or other social activities as evidenced by patient report Patient will continue to perform ADL's independently as evidenced by patient/caregiver report Patient will continue to perform IADL's independently as evidenced by patient/caregiver report Patient will call provider office for new concerns or questions as evidenced by review of documented incoming telephone call notes and patient report Patient will work with BSW to address care coordination needs and will continue to work with the clinical team to address health care and disease management related needs as evidenced by documented adherence to scheduled care management/care coordination appointments - call for medicine refill 2 or 3 days before it runs out - take all medications exactly as prescribed - call doctor with any symptoms you believe are related to your medicine - call doctor when you experience any new symptoms - go to all doctor appointments as scheduled - adhere to prescribed diet: heart healthy        Plan:No further follow up required: the patient has met the goals of care and no further follow up required at this time. The patients granddaughter knows to call the [facility] for new needs or concerns.  [name] RN, MSN, [facility] Mobile:  [phone]          

## 2022-03-22 ENCOUNTER — Telehealth: Payer: Medicare Other

## 2022-04-03 DIAGNOSIS — F339 Major depressive disorder, recurrent, unspecified: Secondary | ICD-10-CM

## 2022-04-03 DIAGNOSIS — E782 Mixed hyperlipidemia: Secondary | ICD-10-CM

## 2022-04-14 ENCOUNTER — Ambulatory Visit (INDEPENDENT_AMBULATORY_CARE_PROVIDER_SITE_OTHER): Payer: Medicare Other | Admitting: Family Medicine

## 2022-04-14 ENCOUNTER — Ambulatory Visit: Payer: Self-pay

## 2022-04-14 ENCOUNTER — Encounter: Payer: Self-pay | Admitting: Family Medicine

## 2022-04-14 VITALS — Temp 97.4°F | Wt 160.6 lb

## 2022-04-14 DIAGNOSIS — R112 Nausea with vomiting, unspecified: Secondary | ICD-10-CM

## 2022-04-14 DIAGNOSIS — I129 Hypertensive chronic kidney disease with stage 1 through stage 4 chronic kidney disease, or unspecified chronic kidney disease: Secondary | ICD-10-CM | POA: Diagnosis not present

## 2022-04-14 DIAGNOSIS — N3000 Acute cystitis without hematuria: Secondary | ICD-10-CM

## 2022-04-14 DIAGNOSIS — D692 Other nonthrombocytopenic purpura: Secondary | ICD-10-CM | POA: Diagnosis not present

## 2022-04-14 DIAGNOSIS — F339 Major depressive disorder, recurrent, unspecified: Secondary | ICD-10-CM | POA: Diagnosis not present

## 2022-04-14 DIAGNOSIS — E559 Vitamin D deficiency, unspecified: Secondary | ICD-10-CM | POA: Diagnosis not present

## 2022-04-14 DIAGNOSIS — N183 Chronic kidney disease, stage 3 unspecified: Secondary | ICD-10-CM | POA: Diagnosis not present

## 2022-04-14 DIAGNOSIS — E782 Mixed hyperlipidemia: Secondary | ICD-10-CM | POA: Diagnosis not present

## 2022-04-14 MED ORDER — ONDANSETRON HCL 4 MG PO TABS: 4.0000 mg | ORAL_TABLET | Freq: Three times a day (TID) | ORAL | 0 refills | Status: DC | PRN

## 2022-04-14 MED ORDER — AMOXICILLIN-POT CLAVULANATE 875-125 MG PO TABS: 1.0000 | ORAL_TABLET | Freq: Two times a day (BID) | ORAL | 0 refills | Status: DC

## 2022-04-14 NOTE — Progress Notes (Signed)
Temp (!) 97.4 F (36.3 C) (Oral)   Wt 160 lb 9.6 oz (72.8 kg)   LMP  (LMP Unknown)   SpO2 96%   BMI 30.35 kg/m    Subjective:    Patient ID: Renee Bridges, female    DOB: 1945/12/16, 76 y.o.   MRN: [id number]  HPI: Renee Bridges is a 76 y.o. female  Chief Complaint  Patient presents with   Dizziness    Patient says every time she gets up, is when she notices it more. Patient says she is getting up too fast and bending over.    Emesis    Patient says when she is going to lay down for bed and thinks when the drainage starts, she will start to cough to the point where she is throwing.    Sleeping Problem    Patient says she does not feel good and it has been for a few weeks.    ABDOMINAL ISSUES Duration: couple of weeks to months Nature: nauseous when eating Location: lower abdomen  Severity: moderate  Radiation: into her back Episode duration: Frequency: with eating Alleviating factors: not eating Aggravating factors: eating Treatments attempted: pepto Constipation: yes Diarrhea: no Mucous in the stool: no Heartburn: no Bloating:yes Flatulence: no Nausea: yes Vomiting: yes Episodes of vomit/day: couple of times a day Melena or hematochezia: no Rash: no Jaundice: no Fever: no Weight loss: no  DIZZINESS Duration: couple of weeks Description of symptoms: off kilter Duration of episode: minutes Dizziness frequency: couple of times a day Provoking factors: unknown Triggered by rolling over in bed: no Triggered by bending over: yes Aggravated by head movement: no Aggravated by exertion, coughing, loud noises: no Recent head injury: no Recent or current viral symptoms: no History of vasovagal episodes: no Nausea: yes Vomiting: yes Tinnitus: no Hearing loss: no Aural fullness: yes Headache: yes Photophobia/phonophobia: no Unsteady gait: yes Postural instability: yes Diplopia, dysarthria, dysphagia or weakness: no Related to exertion: no Pallor:  no Diaphoresis: no Dyspnea: no Chest pain: no  Relevant past medical, surgical, family and social history reviewed and updated as indicated. Interim medical history since our last visit reviewed. Allergies and medications reviewed and updated.  Review of Systems  Constitutional:  Positive for chills, diaphoresis and fatigue. Negative for activity change, appetite change, fever and unexpected weight change.  Respiratory: Negative.    Cardiovascular: Negative.   Gastrointestinal:  Positive for abdominal pain and nausea. Negative for abdominal distention, anal bleeding, blood in stool, constipation, diarrhea, rectal pain and vomiting.  Genitourinary: Negative.   Musculoskeletal: Negative.   Skin: Negative.   Psychiatric/Behavioral: Negative.      Per HPI unless specifically indicated above     Objective:    Temp (!) 97.4 F (36.3 C) (Oral)   Wt 160 lb 9.6 oz (72.8 kg)   LMP  (LMP Unknown)   SpO2 96%   BMI 30.35 kg/m   Wt Readings from Last 3 Encounters:  04/14/22 160 lb 9.6 oz (72.8 kg)  12/02/21 169 lb (76.7 kg)  10/04/21 151 lb (68.5 kg)    Physical Exam Vitals and nursing note reviewed.  Constitutional:      General: She is not in acute distress.    Appearance: Normal appearance. She is obese. She is not ill-appearing, toxic-appearing or diaphoretic.  HENT:     Head: Normocephalic and atraumatic.     Right Ear: External ear normal.     Left Ear: External ear normal.     Nose: Nose  normal.     Mouth/Throat:     Mouth: Mucous membranes are moist.     Pharynx: Oropharynx is clear.  Eyes:     General: No scleral icterus.       Right eye: No discharge.        Left eye: No discharge.     Extraocular Movements: Extraocular movements intact.     Conjunctiva/sclera: Conjunctivae normal.     Pupils: Pupils are equal, round, and reactive to light.  Cardiovascular:     Rate and Rhythm: Normal rate and regular rhythm.     Pulses: Normal pulses.     Heart sounds: Normal  heart sounds. No murmur heard.    No friction rub. No gallop.  Pulmonary:     Effort: Pulmonary effort is normal. No respiratory distress.     Breath sounds: Normal breath sounds. No stridor. No wheezing, rhonchi or rales.  Chest:     Chest wall: No tenderness.  Musculoskeletal:        General: Normal range of motion.     Cervical back: Normal range of motion and neck supple.  Skin:    General: Skin is warm and dry.     Capillary Refill: Capillary refill takes less than 2 seconds.     Coloration: Skin is not jaundiced or pale.     Findings: No bruising, erythema, lesion or rash.  Neurological:     General: No focal deficit present.     Mental Status: She is alert and oriented to person, place, and time. Mental status is at baseline.  Psychiatric:        Mood and Affect: Mood normal.        Behavior: Behavior normal.        Thought Content: Thought content normal.        Judgment: Judgment normal.     Results for orders placed or performed in visit on 04/14/22  Urine Culture   Specimen: Urine   UR  Result Value Ref Range   Urine Culture, Routine Final report    Organism ID, Bacteria Comment   Microscopic Examination   Urine  Result Value Ref Range   WBC, UA 6-10 (A) 0 - 5 /hpf   RBC, Urine 3-10 (A) 0 - 2 /hpf   Epithelial Cells (non renal) 0-10 0 - 10 /hpf   Mucus, UA Present (A) Not Estab.   Bacteria, UA Few (A) None seen/Few  CBC with Differential/Platelet  Result Value Ref Range   WBC 8.3 3.4 - 10.8 x10E3/uL   RBC 4.58 3.77 - 5.28 x10E6/uL   Hemoglobin 12.8 11.1 - 15.9 g/dL   Hematocrit 40.2 34.0 - 46.6 %   MCV 88 79 - 97 fL   MCH 27.9 26.6 - 33.0 pg   MCHC 31.8 31.5 - 35.7 g/dL   RDW 12.5 11.7 - 15.4 %   Platelets 202 150 - 450 x10E3/uL   Neutrophils 54 Not Estab. %   Lymphs 31 Not Estab. %   Monocytes 10 Not Estab. %   Eos 4 Not Estab. %   Basos 1 Not Estab. %   Neutrophils Absolute 4.5 1.4 - 7.0 x10E3/uL   Lymphocytes Absolute 2.5 0.7 - 3.1 x10E3/uL    Monocytes Absolute 0.8 0.1 - 0.9 x10E3/uL   EOS (ABSOLUTE) 0.3 0.0 - 0.4 x10E3/uL   Basophils Absolute 0.1 0.0 - 0.2 x10E3/uL   Immature Granulocytes 0 Not Estab. %   Immature Grans (Abs) 0.0 0.0 - 0.1 x10E3/uL  Comprehensive  metabolic panel  Result Value Ref Range   Glucose 99 70 - 99 mg/dL   BUN 11 8 - 27 mg/dL   Creatinine, Ser 1.14 (H) 0.57 - 1.00 mg/dL   eGFR 49 (L) >59 mL/min/1.73   BUN/Creatinine Ratio 10 (L) 12 - 28   Sodium 137 134 - 144 mmol/L   Potassium 4.8 3.5 - 5.2 mmol/L   Chloride 101 96 - 106 mmol/L   CO2 22 20 - 29 mmol/L   Calcium 9.3 8.7 - 10.3 mg/dL   Total Protein 6.3 6.0 - 8.5 g/dL   Albumin 4.1 3.8 - 4.8 g/dL   Globulin, Total 2.2 1.5 - 4.5 g/dL   Albumin/Globulin Ratio 1.9 1.2 - 2.2   Bilirubin Total 0.5 0.0 - 1.2 mg/dL   Alkaline Phosphatase 84 44 - 121 IU/L   AST 23 0 - 40 IU/L   ALT 11 0 - 32 IU/L  Lipid Panel w/o Chol/HDL Ratio  Result Value Ref Range   Cholesterol, Total 197 100 - 199 mg/dL   Triglycerides 107 0 - 149 mg/dL   HDL 42 >39 mg/dL   VLDL Cholesterol Cal 19 5 - 40 mg/dL   LDL Chol Calc (NIH) 136 (H) 0 - 99 mg/dL  Urinalysis, Routine w reflex microscopic  Result Value Ref Range   Specific Gravity, UA 1.020 1.005 - 1.030   pH, UA 7.0 5.0 - 7.5   Color, UA Yellow Yellow   Appearance Ur Clear Clear   Leukocytes,UA 3+ (A) Negative   Protein,UA 1+ (A) Negative/Trace   Glucose, UA Negative Negative   Ketones, UA 2+ (A) Negative   RBC, UA Trace (A) Negative   Bilirubin, UA Negative Negative   Urobilinogen, Ur 1.0 0.2 - 1.0 mg/dL   Nitrite, UA Negative Negative   Microscopic Examination See below:   TSH  Result Value Ref Range   TSH 1.720 0.450 - 4.500 uIU/mL  Bayer DCA Hb A1c Waived  Result Value Ref Range   HB A1C (BAYER DCA - WAIVED) 5.7 (H) 4.8 - 5.6 %  VITAMIN D 25 Hydroxy (Vit-D Deficiency, Fractures)  Result Value Ref Range   Vit D, 25-Hydroxy 19.8 (L) 30.0 - 100.0 ng/mL  Lipase  Result Value Ref Range   Lipase 43 14 -  85 U/L  Amylase  Result Value Ref Range   Amylase 76 31 - 110 U/L      Assessment & Plan:   Problem List Items Addressed This Visit       Cardiovascular and Mediastinum   Senile purpura ([place])    Reassured patient. Continue to monitor. Labs checked today.         Genitourinary   Benign hypertensive renal disease    Doing well not on medication. Checking labs today. Await results. Treat as needed.       Relevant Orders   CBC with Differential/Platelet (Completed)   Comprehensive metabolic panel (Completed)   VITAMIN D 25 Hydroxy (Vit-D Deficiency, Fractures) (Completed)   Chronic kidney disease, stage 3 ([place])    Rechecking labs. Await results. Treat as needed.         Other   Depression, recurrent ([place])    Has been off medicine. Doesn't want to take anything right now. Continue to monitor.       Relevant Orders   CBC with Differential/Platelet (Completed)   Comprehensive metabolic panel (Completed)   TSH (Completed)   VITAMIN D 25 Hydroxy (Vit-D Deficiency, Fractures) (Completed)   Hyperlipidemia    Rechecking  labs today. Await results. Treat as needed.       Relevant Orders   CBC with Differential/Platelet (Completed)   Comprehensive metabolic panel (Completed)   Lipid Panel w/o Chol/HDL Ratio (Completed)   VITAMIN D 25 Hydroxy (Vit-D Deficiency, Fractures) (Completed)   Other Visit Diagnoses     Acute cystitis without hematuria    -  Primary   Will treat while we wait on culture. Await results.    Relevant Orders   Urine Culture (Completed)   Nausea and vomiting, unspecified vomiting type       ?due to UTI. Will treat and check other labs. Await results.    Relevant Orders   CBC with Differential/Platelet (Completed)   Comprehensive metabolic panel (Completed)   Urinalysis, Routine w reflex microscopic (Completed)   Bayer DCA Hb A1c Waived (Completed)   VITAMIN D 25 Hydroxy (Vit-D Deficiency, Fractures) (Completed)   Lipase (Completed)   Amylase  (Completed)        Follow up plan: Return in about 2 weeks (around 04/28/2022).

## 2022-04-14 NOTE — Telephone Encounter (Signed)
Will see today.  

## 2022-04-14 NOTE — Telephone Encounter (Signed)
  Chief Complaint: Dizziness 1x, Not eating much, vomiting 1x daily for the past 2 weeks (did not vomit the past 2 nights).  Symptoms: ibid Frequency: 2 weeks Pertinent Negatives: Patient denies  Disposition: [] ED /[] Urgent Care (no appt availability in office) / [x] Appointment(In office/virtual)/ []  [facility]/ [] Home Care/ [] Refused Recommended Disposition /[] [facility]/ []  Follow-up with PCP Additional Notes: Dizziness 1x, Not eating much, vomiting 1x daily for the past 2 weeks (did not vomit the past 2 nights).  Sleeping a lot.   Granddaughter, , would like to see if pt can be worked in this afternoon with Dr. .    Reason for Disposition  [1] MILD dizziness (e.g., walking normally) AND [2] has NOT been evaluated by doctor (or NP/PA) for this  (Exception: Dizziness caused by heat exposure, sudden standing, or poor fluid intake.)  Answer Assessment - Initial Assessment Questions 1. DESCRIPTION: "Describe your dizziness."     Had to hold onto sink for a minute 2. LIGHTHEADED: "Do you feel lightheaded?" (e.g., somewhat faint, woozy, weak upon standing)     Lighted after getting out of shower 3. VERTIGO: "Do you feel like either you or the room is spinning or tilting?" (i.e. vertigo)     No 4. SEVERITY: "How bad is it?"  "Do you feel like you are going to faint?" "Can you stand and walk?"   - MILD: Feels slightly dizzy, but walking normally.   - MODERATE: Feels unsteady when walking, but not falling; interferes with normal activities (e.g., school, work).   - SEVERE: Unable to walk without falling, or requires assistance to walk without falling; feels like passing out now.      Moderate 5. ONSET:  "When did the dizziness begin?"     1 time last night 6. AGGRAVATING FACTORS: "Does anything make it worse?" (e.g., standing, change in head position)     unsure 7. HEART RATE: "Can you tell me your heart rate?" "How many beats in 15 seconds?"  (Note: not  all patients can do this)       81 today 8. CAUSE: "What do you think is causing the dizziness?"     Unsure - hasn't eaten much lately 9. RECURRENT SYMPTOM: "Have you had dizziness before?" If Yes, ask: "When was the last time?" "What happened that time?"     no 10. OTHER SYMPTOMS: "Do you have any other symptoms?" (e.g., fever, chest pain, vomiting, diarrhea, bleeding)       Vomiting 1 x daily for the past 2 weeks - not last night or the night before 11. PREGNANCY: "Is there any chance you are pregnant?" "When was your last menstrual period?"       na  Protocols used: Dizziness - Lightheadedness-A-AH

## 2022-04-15 LAB — CBC WITH DIFFERENTIAL/PLATELET
Basophils Absolute: 0.1 10*3/uL (ref 0.0–0.2)
Basos: 1 %
EOS (ABSOLUTE): 0.3 10*3/uL (ref 0.0–0.4)
Eos: 4 %
Hematocrit: 40.2 % (ref 34.0–46.6)
Hemoglobin: 12.8 g/dL (ref 11.1–15.9)
Immature Grans (Abs): 0 10*3/uL (ref 0.0–0.1)
Immature Granulocytes: 0 %
Lymphocytes Absolute: 2.5 10*3/uL (ref 0.7–3.1)
Lymphs: 31 %
MCH: 27.9 pg (ref 26.6–33.0)
MCHC: 31.8 g/dL (ref 31.5–35.7)
MCV: 88 fL (ref 79–97)
Monocytes Absolute: 0.8 10*3/uL (ref 0.1–0.9)
Monocytes: 10 %
Neutrophils Absolute: 4.5 10*3/uL (ref 1.4–7.0)
Neutrophils: 54 %
Platelets: 202 10*3/uL (ref 150–450)
RBC: 4.58 x10E6/uL (ref 3.77–5.28)
RDW: 12.5 % (ref 11.7–15.4)
WBC: 8.3 10*3/uL (ref 3.4–10.8)

## 2022-04-15 LAB — COMPREHENSIVE METABOLIC PANEL WITH GFR
ALT: 11 [IU]/L (ref 0–32)
AST: 23 [IU]/L (ref 0–40)
Albumin/Globulin Ratio: 1.9 (ref 1.2–2.2)
Albumin: 4.1 g/dL (ref 3.8–4.8)
Alkaline Phosphatase: 84 [IU]/L (ref 44–121)
BUN/Creatinine Ratio: 10 — ABNORMAL LOW (ref 12–28)
BUN: 11 mg/dL (ref 8–27)
Bilirubin Total: 0.5 mg/dL (ref 0.0–1.2)
CO2: 22 mmol/L (ref 20–29)
Calcium: 9.3 mg/dL (ref 8.7–10.3)
Chloride: 101 mmol/L (ref 96–106)
Creatinine, Ser: 1.14 mg/dL — ABNORMAL HIGH (ref 0.57–1.00)
Globulin, Total: 2.2 g/dL (ref 1.5–4.5)
Glucose: 99 mg/dL (ref 70–99)
Potassium: 4.8 mmol/L (ref 3.5–5.2)
Sodium: 137 mmol/L (ref 134–144)
Total Protein: 6.3 g/dL (ref 6.0–8.5)
eGFR: 49 mL/min/{1.73_m2} — ABNORMAL LOW

## 2022-04-15 LAB — URINALYSIS, ROUTINE W REFLEX MICROSCOPIC
Bilirubin, UA: NEGATIVE
Glucose, UA: NEGATIVE
Nitrite, UA: NEGATIVE
Specific Gravity, UA: 1.02 (ref 1.005–1.030)
Urobilinogen, Ur: 1 mg/dL (ref 0.2–1.0)
pH, UA: 7 (ref 5.0–7.5)

## 2022-04-15 LAB — TSH: TSH: 1.72 u[IU]/mL (ref 0.450–4.500)

## 2022-04-15 LAB — LIPID PANEL W/O CHOL/HDL RATIO
Cholesterol, Total: 197 mg/dL (ref 100–199)
HDL: 42 mg/dL (ref >39)
LDL Chol Calc (NIH): 136 mg/dL — ABNORMAL HIGH (ref 0–99)
Triglycerides: 107 mg/dL (ref 0–149)
VLDL Cholesterol Cal: 19 mg/dL (ref 5–40)

## 2022-04-15 LAB — AMYLASE: Amylase: 76 U/L (ref 31–110)

## 2022-04-15 LAB — MICROSCOPIC EXAMINATION

## 2022-04-15 LAB — BAYER DCA HB A1C WAIVED: HB A1C (BAYER DCA - WAIVED): 5.7 % — ABNORMAL HIGH (ref 4.8–5.6)

## 2022-04-15 LAB — LIPASE: Lipase: 43 U/L (ref 14–85)

## 2022-04-15 LAB — VITAMIN D 25 HYDROXY (VIT D DEFICIENCY, FRACTURES): Vit D, 25-Hydroxy: 19.8 ng/mL — ABNORMAL LOW (ref 30.0–100.0)

## 2022-04-16 ENCOUNTER — Encounter: Payer: Self-pay | Admitting: Family Medicine

## 2022-04-16 DIAGNOSIS — D692 Other nonthrombocytopenic purpura: Secondary | ICD-10-CM

## 2022-04-16 HISTORY — DX: Other nonthrombocytopenic purpura: D69.2

## 2022-04-16 LAB — URINE CULTURE

## 2022-04-16 NOTE — Assessment & Plan Note (Signed)
Has been off medicine. Doesn't want to take anything right now. Continue to monitor.

## 2022-04-16 NOTE — Assessment & Plan Note (Signed)
Rechecking labs today. Await results. Treat as needed.  °

## 2022-04-16 NOTE — Assessment & Plan Note (Signed)
Doing well not on medication. Checking labs today. Await results. Treat as needed.

## 2022-04-16 NOTE — Assessment & Plan Note (Signed)
Reassured patient. Continue to monitor. Labs checked today.

## 2022-04-16 NOTE — Assessment & Plan Note (Signed)
Rechecking labs. Await results. Treat as needed.  

## 2022-04-17 ENCOUNTER — Other Ambulatory Visit: Payer: Self-pay | Admitting: Family Medicine

## 2022-04-17 MED ORDER — VITAMIN D (ERGOCALCIFEROL) 1.25 MG (50000 UNIT) PO CAPS: 50000.0000 [IU] | ORAL_CAPSULE | ORAL | 0 refills | Status: DC

## 2022-04-26 ENCOUNTER — Ambulatory Visit: Payer: Self-pay

## 2022-04-26 NOTE — Telephone Encounter (Signed)
Called transferred from agent. Agent was calling to confirm appt for Monday. Per agent, in that call, pt complained of cough that was like choking her at night.   Spoke with pt. Pt did not have hearing aids in and conversation was difficult to control. Unable to triage pt. PT did complain of choking cough at night and profuse sweating at night even with AC and fan on. Pt stated she would be at appt on Monday.  Called granddaughter [name] for assistance. [name] saw pt a few days back and she was fine. [name] will call pt to check on her and see if triage is necessary. [name] states that her grandmother can be dramatic at times and stubborn, and does not like to wear her hearing aids.   [name] will call back.

## 2022-04-27 NOTE — Telephone Encounter (Signed)
Just an FYI for upcoming appointment on Monday.

## 2022-04-28 ENCOUNTER — Ambulatory Visit: Payer: Medicare Other | Admitting: Family Medicine

## 2022-05-01 ENCOUNTER — Telehealth: Payer: Self-pay

## 2022-05-01 ENCOUNTER — Ambulatory Visit: Payer: Medicare Other | Admitting: Family Medicine

## 2022-05-01 NOTE — Telephone Encounter (Signed)
Copied from CRM [id number]. Topic: General - Inquiry >> May 01, 2022  3:36 PM De Blanch wrote: Reason for CRM: Pt is upset and frustrated; stated her [facility] ride did not show up for her appointment today, and they canceled her ride.Pt is worried she will be charged for the no-show. Pt mentioned has a nurse going to see her on September 11th,asking if it is possible for the nurse to do everything that Dr.Johnson may need.   Rescheduled pt appointment for 05/09/2022. Pt stated she would call [employer] in regards to her ride.  Please advise.

## 2022-05-09 ENCOUNTER — Encounter: Payer: Self-pay | Admitting: Family Medicine

## 2022-05-09 ENCOUNTER — Ambulatory Visit (INDEPENDENT_AMBULATORY_CARE_PROVIDER_SITE_OTHER): Payer: Medicare Other | Admitting: Family Medicine

## 2022-05-09 VITALS — BP 125/77 | HR 80 | Temp 98.2°F | Wt 161.2 lb

## 2022-05-09 DIAGNOSIS — N3000 Acute cystitis without hematuria: Secondary | ICD-10-CM | POA: Diagnosis not present

## 2022-05-09 LAB — URINALYSIS, ROUTINE W REFLEX MICROSCOPIC
Bilirubin, UA: NEGATIVE
Glucose, UA: NEGATIVE
Nitrite, UA: NEGATIVE
Specific Gravity, UA: 1.025 (ref 1.005–1.030)
Urobilinogen, Ur: 0.2 mg/dL (ref 0.2–1.0)
pH, UA: 6 (ref 5.0–7.5)

## 2022-05-09 LAB — MICROSCOPIC EXAMINATION: Bacteria, UA: NONE SEEN

## 2022-05-09 NOTE — Progress Notes (Signed)
BP 125/77   Pulse 80   Temp 98.2 F (36.8 C)   Wt 161 lb 3.2 oz (73.1 kg)   LMP  (LMP Unknown)   SpO2 98%   BMI 30.46 kg/m    Subjective:    Patient ID: Renee Bridges, female    DOB: [birth year], 76 y.o.   MRN: [id number]  HPI: Renee Bridges is a 76 y.o. female  Chief Complaint  Patient presents with   Urinary Tract Infection    Patient states she completed antibiotic for UTI but is still concerned about her urine, states it is very yellow and has bubbles    URINARY SYMPTOMS- Nausea is better. No more vomiting. Did not finish her abx Duration: Chronic Dysuria: yes Urinary frequency: yes Urgency: yes Small volume voids: no Symptom severity: no Urinary incontinence: no Foul odor: yes Hematuria: no Abdominal pain: no Back pain: no Suprapubic pain/pressure: no Flank pain: no Fever:  no Vomiting: yes Relief with cranberry juice: no Relief with pyridium: no Status: not better Previous urinary tract infection: no Recurrent urinary tract infection: no Sexual activity: No sexually active History of sexually transmitted disease: no Vaginal discharge: no Treatments attempted: antibiotics and increasing fluids    Relevant past medical, surgical, family and social history reviewed and updated as indicated. Interim medical history since our last visit reviewed. Allergies and medications reviewed and updated.  Review of Systems  Constitutional: Negative.   Respiratory: Negative.    Cardiovascular: Negative.   Gastrointestinal: Negative.   Genitourinary: Negative.   Musculoskeletal: Negative.   Neurological: Negative.   Psychiatric/Behavioral: Negative.      Per HPI unless specifically indicated above     Objective:    BP 125/77   Pulse 80   Temp 98.2 F (36.8 C)   Wt 161 lb 3.2 oz (73.1 kg)   LMP  (LMP Unknown)   SpO2 98%   BMI 30.46 kg/m   Wt Readings from Last 3 Encounters:  05/09/22 161 lb 3.2 oz (73.1 kg)  04/14/22 160 lb 9.6 oz (72.8 kg)   12/02/21 169 lb (76.7 kg)    Physical Exam Vitals and nursing note reviewed.  Constitutional:      General: She is not in acute distress.    Appearance: Normal appearance. She is obese. She is not ill-appearing, toxic-appearing or diaphoretic.  HENT:     Head: Normocephalic and atraumatic.     Right Ear: External ear normal.     Left Ear: External ear normal.     Nose: Nose normal.     Mouth/Throat:     Mouth: Mucous membranes are moist.     Pharynx: Oropharynx is clear.  Eyes:     General: No scleral icterus.       Right eye: No discharge.        Left eye: No discharge.     Extraocular Movements: Extraocular movements intact.     Conjunctiva/sclera: Conjunctivae normal.     Pupils: Pupils are equal, round, and reactive to light.  Cardiovascular:     Rate and Rhythm: Normal rate and regular rhythm.     Pulses: Normal pulses.     Heart sounds: Normal heart sounds. No murmur heard.    No friction rub. No gallop.  Pulmonary:     Effort: Pulmonary effort is normal. No respiratory distress.     Breath sounds: Normal breath sounds. No stridor. No wheezing, rhonchi or rales.  Chest:     Chest wall: No tenderness.  Musculoskeletal:        General: Normal range of motion.     Cervical back: Normal range of motion and neck supple.  Skin:    General: Skin is warm and dry.     Capillary Refill: Capillary refill takes less than 2 seconds.     Coloration: Skin is not jaundiced or pale.     Findings: No bruising, erythema, lesion or rash.  Neurological:     General: No focal deficit present.     Mental Status: She is alert and oriented to person, place, and time. Mental status is at baseline.  Psychiatric:        Mood and Affect: Mood normal.        Behavior: Behavior normal.        Thought Content: Thought content normal.        Judgment: Judgment normal.     Results for orders placed or performed in visit on 05/09/22  Microscopic Examination   Urine  Result Value Ref Range    WBC, UA 0-5 0 - 5 /hpf   RBC, Urine 0-2 0 - 2 /hpf   Epithelial Cells (non renal) 0-10 0 - 10 /hpf   Mucus, UA Present (A) Not Estab.   Bacteria, UA None seen None seen/Few  Urinalysis, Routine w reflex microscopic  Result Value Ref Range   Specific Gravity, UA 1.025 1.005 - 1.030   pH, UA 6.0 5.0 - 7.5   Color, UA Yellow Yellow   Appearance Ur Cloudy (A) Clear   Leukocytes,UA Trace (A) Negative   Protein,UA Trace (A) Negative/Trace   Glucose, UA Negative Negative   Ketones, UA Trace (A) Negative   RBC, UA Trace (A) Negative   Bilirubin, UA Negative Negative   Urobilinogen, Ur 0.2 0.2 - 1.0 mg/dL   Nitrite, UA Negative Negative   Microscopic Examination See below:       Assessment & Plan:   Problem List Items Addressed This Visit   None Visit Diagnoses     Acute cystitis without hematuria    -  Primary   Did not finish her abx. Will check culture. Await results. Treat as needed.   Relevant Orders   Urinalysis, Routine w reflex microscopic (Completed)   Urine Culture        Follow up plan: Return in about 3 weeks (around 05/30/2022) for Wellness/physical.

## 2022-05-11 LAB — URINE CULTURE

## 2022-05-12 ENCOUNTER — Encounter: Payer: Self-pay | Admitting: Family Medicine

## 2022-05-15 ENCOUNTER — Ambulatory Visit: Payer: Medicare Other | Admitting: Nurse Practitioner

## 2022-06-02 ENCOUNTER — Ambulatory Visit: Payer: Medicare Other | Admitting: Family Medicine

## 2022-06-08 ENCOUNTER — Ambulatory Visit: Payer: Medicare Other | Admitting: Family Medicine

## 2022-06-15 ENCOUNTER — Encounter: Payer: Self-pay | Admitting: Family Medicine

## 2022-06-15 ENCOUNTER — Ambulatory Visit (INDEPENDENT_AMBULATORY_CARE_PROVIDER_SITE_OTHER): Payer: Medicare Other | Admitting: Family Medicine

## 2022-06-15 VITALS — BP 136/83 | HR 67 | Temp 98.0°F | Ht 62.0 in | Wt 162.7 lb

## 2022-06-15 DIAGNOSIS — I129 Hypertensive chronic kidney disease with stage 1 through stage 4 chronic kidney disease, or unspecified chronic kidney disease: Secondary | ICD-10-CM

## 2022-06-15 DIAGNOSIS — Z23 Encounter for immunization: Secondary | ICD-10-CM

## 2022-06-15 DIAGNOSIS — R8281 Pyuria: Secondary | ICD-10-CM | POA: Diagnosis not present

## 2022-06-15 DIAGNOSIS — Z0000 Encounter for general adult medical examination without abnormal findings: Secondary | ICD-10-CM | POA: Diagnosis not present

## 2022-06-15 DIAGNOSIS — E782 Mixed hyperlipidemia: Secondary | ICD-10-CM

## 2022-06-15 DIAGNOSIS — Z13820 Encounter for screening for osteoporosis: Secondary | ICD-10-CM | POA: Diagnosis not present

## 2022-06-15 DIAGNOSIS — F339 Major depressive disorder, recurrent, unspecified: Secondary | ICD-10-CM | POA: Diagnosis not present

## 2022-06-15 LAB — MICROSCOPIC EXAMINATION: Bacteria, UA: NONE SEEN

## 2022-06-15 LAB — URINALYSIS, ROUTINE W REFLEX MICROSCOPIC
Bilirubin, UA: NEGATIVE
Glucose, UA: NEGATIVE
Ketones, UA: NEGATIVE
Nitrite, UA: NEGATIVE
Protein,UA: NEGATIVE
Specific Gravity, UA: 1.01 (ref 1.005–1.030)
Urobilinogen, Ur: 0.2 mg/dL (ref 0.2–1.0)
pH, UA: 6.5 (ref 5.0–7.5)

## 2022-06-15 LAB — MICROALBUMIN, URINE WAIVED
Creatinine, Urine Waived: 50 mg/dL (ref 10–300)
Microalb, Ur Waived: 10 mg/L (ref 0–19)
Microalb/Creat Ratio: <30 mg/g (ref <30)

## 2022-06-15 MED ORDER — POLYETHYLENE GLYCOL 3350 17 GM/SCOOP PO POWD: 17.0000 g | Freq: Two times a day (BID) | ORAL | 1 refills | Status: DC | PRN

## 2022-06-15 NOTE — Progress Notes (Signed)
BP 136/83   Pulse 67   Temp 98 F (36.7 C)   Ht $R'5\' 2"'ya$  (1.575 m)   Wt 162 lb 11.2 oz (73.8 kg)   LMP  (LMP Unknown)   SpO2 96%   BMI 29.76 kg/m    Subjective:    Patient ID: Renee Bridges, female    DOB: 1946/03/05, 76 y.o.   MRN: [id number]  HPI: Renee Bridges is a 76 y.o. female presenting on 06/15/2022 for comprehensive medical examination. Current medical complaints include:  HYPERTENSION / HYPERLIPIDEMIA Satisfied with current treatment? yes Duration of hypertension: chronic- not on anything BP monitoring frequency: not checking BP medication side effects: no Past BP meds: none Duration of hyperlipidemia: chronic Cholesterol medication side effects: not on anything Cholesterol supplements: none Past cholesterol medications: none Medication compliance: not on anything Aspirin: no Recent stressors: yes Recurrent headaches: no Visual changes: no Palpitations: no Dyspnea: no Chest pain: no Lower extremity edema: no Dizzy/lightheaded: no  DEPRESSION Mood status: stable Satisfied with current treatment?: yes Symptom severity: moderate  Duration of current treatment : not currently on anything Psychotherapy/counseling: no  Depressed mood: yes Anxious mood: yes Anhedonia: no Significant weight loss or gain: no Insomnia: no  Fatigue: yes Feelings of worthlessness or guilt: yes Impaired concentration/indecisiveness: no Suicidal ideations: no Hopelessness: no Crying spells: yes    06/15/2022    1:34 PM 05/09/2022    3:10 PM 04/14/2022    3:50 PM 09/08/2021    1:20 PM 09/08/2021    1:05 PM  Depression screen PHQ 2/9  Decreased Interest 0 0 $R'2 1 1  'kd$ Down, Depressed, Hopeless 0 0 $R'2 2 2  'uL$ PHQ - 2 Score 0 0 $R'4 3 3  'lE$ Altered sleeping $RemoveBeforeDE'3 1 3 'arbSLRruOoYLdfO$ 0 0  Tired, decreased energy $RemoveBeforeDE'3 1 3 'WxFSiLeEZxWFwJa$ 0 0  Change in appetite $RemoveBef'3 1 3 'wmZJcLJMry$ 0 0  Feeling bad or failure about yourself  0 0 0 0 0  Trouble concentrating 0 0 0 0 0  Moving slowly or fidgety/restless 0 0 0 0 0  Suicidal thoughts 0 0 0 0 0   PHQ-9 Score $RemoveBef'9 3 13 3 3  'KzNvePXEVO$ Difficult doing work/chores    Somewhat difficult     She currently lives with: alone Menopausal Symptoms: no  Depression Screen done today and results listed below:     06/15/2022    1:34 PM 05/09/2022    3:10 PM 04/14/2022    3:50 PM 09/08/2021    1:20 PM 09/08/2021    1:05 PM  Depression screen PHQ 2/9  Decreased Interest 0 0 $R'2 1 1  'sZ$ Down, Depressed, Hopeless 0 0 $R'2 2 2  'jv$ PHQ - 2 Score 0 0 $R'4 3 3  'GZ$ Altered sleeping $RemoveBeforeDE'3 1 3 'yBzSIFeqmJHFLXW$ 0 0  Tired, decreased energy $RemoveBeforeDE'3 1 3 'YKhNfwRIYGDLgfX$ 0 0  Change in appetite $RemoveBef'3 1 3 'jwmeJXXjMi$ 0 0  Feeling bad or failure about yourself  0 0 0 0 0  Trouble concentrating 0 0 0 0 0  Moving slowly or fidgety/restless 0 0 0 0 0  Suicidal thoughts 0 0 0 0 0  PHQ-9 Score $RemoveBef'9 3 13 3 3  'cKOGPBparv$ Difficult doing work/chores    Somewhat difficult     Past Medical History:  Past Medical History:  Diagnosis Date   Anxiety    Arthritis    Asthma    Complication of surgical procedure 03/08/2018   Constipation 07/17/2016   Depression    Headache    History of hiatal hernia  Hypertension    Immobility 09/25/2016   Kidney cyst, acquired    Seizures (HCC)    hx. as child   Senile purpura ([place]) 04/16/2022   Shortness of breath dyspnea    with excertion    Surgical History:  Past Surgical History:  Procedure Laterality Date   ABDOMINAL HYSTERECTOMY     BACK SURGERY     colonoscopy a year ago     LEG SURGERY Left 03/2015   mass removed from back - 20 years ago     [place] N/A 01/17/2017   Procedure: [place];  Surgeon: Melina Schools, MD;  Location: [facility];  Service: Orthopedics;  Laterality: N/A;  Requests 3 hrs   TONSILLECTOMY     TOTAL KNEE REVISION Left 05/13/2015   Procedure: REVISION LEFT  KNEE ARTHROPLASTY, REVISION PATELLA POLY EXCHANGE;  Surgeon: Rod Can, MD;  Location: [facility];  Service: Orthopedics;  Laterality: Left;    Medications:  Current Outpatient Medications on File Prior to Visit  Medication Sig    ondansetron (ZOFRAN) 4 MG tablet Take 1 tablet (4 mg total) by mouth every 8 (eight) hours as needed for nausea or vomiting. (Patient not taking: Reported on 05/09/2022)   Vitamin D, Ergocalciferol, (DRISDOL) 1.25 MG (50000 UNIT) CAPS capsule Take 1 capsule (50,000 Units total) by mouth every 7 (seven) days.   No current facility-administered medications on file prior to visit.    Allergies:  Allergies  Allergen Reactions   Bactrim [Sulfamethoxazole-Trimethoprim] Other (See Comments)    Sweating, feeling like throat is closing up   Pollen Extract    Codeine Rash    Social History:  Social History   Socioeconomic History   Marital status: Divorced    Spouse name: Not on file   Number of children: Not on file   Years of education: Not on file   Highest education level: Not on file  Occupational History   Not on file  Tobacco Use   Smoking status: Never   Smokeless tobacco: Never  Vaping Use   Vaping Use: Never used  Substance and Sexual Activity   Alcohol use: No    Alcohol/week: 0.0 standard drinks of alcohol   Drug use: No   Sexual activity: Never  Other Topics Concern   Not on file  Social History Narrative   Not on file   Social Determinants of Health   Financial Resource Strain: Low Risk  (09/08/2021)   Overall Financial Resource Strain (CARDIA)    Difficulty of Paying Living Expenses: Not hard at all  Food Insecurity: No Food Insecurity (09/08/2021)   Hunger Vital Sign    Worried About Running Out of Food in the Last Year: Never true    [place] in the Last Year: Never true  Transportation Needs: No Transportation Needs (09/08/2021)   PRAPARE - Hydrologist (Medical): No    Lack of Transportation (Non-Medical): No  Recent Concern: Transportation Needs - Unmet Transportation Needs (06/24/2021)   PRAPARE - Hydrologist (Medical): Not on file    Lack of Transportation (Non-Medical): Yes  Physical  Activity: Inactive (09/08/2021)   Exercise Vital Sign    Days of Exercise per Week: 0 days    Minutes of Exercise per Session: 0 min  Stress: No Stress Concern Present (09/08/2021)   [place]    Feeling of Stress : Only  a little  Social Connections: Socially Isolated (09/08/2021)   Social Connection and Isolation Panel [NHANES]    Frequency of Communication with Friends and Family: Once a week    Frequency of Social Gatherings with Friends and Family: Once a week    Attends Religious Services: Never    Marine scientist or Organizations: No    Attends Archivist Meetings: Never    Marital Status: Divorced  Human resources officer Violence: Not At Risk (09/08/2021)   Humiliation, Afraid, Rape, and Kick questionnaire    Fear of Current or Ex-Partner: No    Emotionally Abused: No    Physically Abused: No    Sexually Abused: No   Social History   Tobacco Use  Smoking Status Never  Smokeless Tobacco Never   Social History   Substance and Sexual Activity  Alcohol Use No   Alcohol/week: 0.0 standard drinks of alcohol    Family History:  Family History  Problem Relation Age of Onset   Cancer Mother    Cancer Father    Diabetes Brother    Cancer Brother    Cancer Brother     Past medical history, surgical history, medications, allergies, family history and social history reviewed with patient today and changes made to appropriate areas of the chart.   Review of Systems  Constitutional: Negative.   HENT:  Positive for hearing loss. Negative for congestion, ear discharge, ear pain, nosebleeds, sinus pain, sore throat and tinnitus.   Eyes: Negative.   Respiratory: Negative.  Negative for stridor.   Cardiovascular: Negative.   Gastrointestinal:  Positive for constipation and vomiting. Negative for abdominal pain, blood in stool, diarrhea, heartburn, melena and nausea.  Genitourinary: Negative.    Musculoskeletal: Negative.   Skin: Negative.   Neurological: Negative.   Endo/Heme/Allergies: Negative.   Psychiatric/Behavioral:  Positive for depression. Negative for hallucinations, memory loss, substance abuse and suicidal ideas. The patient is nervous/anxious. The patient does not have insomnia.    All other ROS negative except what is listed above and in the HPI.      Objective:    BP 136/83   Pulse 67   Temp 98 F (36.7 C)   Ht $R'5\' 2"'AK$  (1.575 m)   Wt 162 lb 11.2 oz (73.8 kg)   LMP  (LMP Unknown)   SpO2 96%   BMI 29.76 kg/m   Wt Readings from Last 3 Encounters:  06/15/22 162 lb 11.2 oz (73.8 kg)  05/09/22 161 lb 3.2 oz (73.1 kg)  04/14/22 160 lb 9.6 oz (72.8 kg)    Physical Exam Vitals and nursing note reviewed.  Constitutional:      General: She is not in acute distress.    Appearance: Normal appearance. She is not ill-appearing, toxic-appearing or diaphoretic.  HENT:     Head: Normocephalic and atraumatic.     Right Ear: Tympanic membrane, ear canal and external ear normal. There is no impacted cerumen.     Left Ear: Tympanic membrane, ear canal and external ear normal. There is no impacted cerumen.     Nose: Nose normal. No congestion or rhinorrhea.     Mouth/Throat:     Mouth: Mucous membranes are moist.     Pharynx: Oropharynx is clear. No oropharyngeal exudate or posterior oropharyngeal erythema.  Eyes:     General: No scleral icterus.       Right eye: No discharge.        Left eye: No discharge.     Extraocular Movements:  Extraocular movements intact.     Conjunctiva/sclera: Conjunctivae normal.     Pupils: Pupils are equal, round, and reactive to light.  Neck:     Vascular: No carotid bruit.  Cardiovascular:     Rate and Rhythm: Normal rate and regular rhythm.     Pulses: Normal pulses.     Heart sounds: No murmur heard.    No friction rub. No gallop.  Pulmonary:     Effort: Pulmonary effort is normal. No respiratory distress.     Breath sounds:  Normal breath sounds. No stridor. No wheezing, rhonchi or rales.  Chest:     Chest wall: No tenderness.  Abdominal:     General: Abdomen is flat. Bowel sounds are normal. There is no distension.     Palpations: Abdomen is soft. There is no mass.     Tenderness: There is no abdominal tenderness. There is no right CVA tenderness, left CVA tenderness, guarding or rebound.     Hernia: No hernia is present.  Genitourinary:    Comments: Breast and pelvic exams deferred with shared decision making Musculoskeletal:        General: No swelling, tenderness, deformity or signs of injury.     Cervical back: Normal range of motion and neck supple. No rigidity. No muscular tenderness.     Right lower leg: No edema.     Left lower leg: No edema.  Lymphadenopathy:     Cervical: No cervical adenopathy.  Skin:    General: Skin is warm and dry.     Capillary Refill: Capillary refill takes less than 2 seconds.     Coloration: Skin is not jaundiced or pale.     Findings: No bruising, erythema, lesion or rash.  Neurological:     General: No focal deficit present.     Mental Status: She is alert and oriented to person, place, and time. Mental status is at baseline.     Cranial Nerves: No cranial nerve deficit.     Sensory: No sensory deficit.     Motor: No weakness.     Coordination: Coordination normal.     Gait: Gait normal.     Deep Tendon Reflexes: Reflexes normal.  Psychiatric:        Mood and Affect: Mood normal.        Behavior: Behavior normal.        Thought Content: Thought content normal.        Judgment: Judgment normal.     Results for orders placed or performed in visit on 06/15/22  Microscopic Examination   Urine  Result Value Ref Range   WBC, UA 0-5 0 - 5 /hpf   RBC, Urine 0-2 0 - 2 /hpf   Epithelial Cells (non renal) 0-10 0 - 10 /hpf   Bacteria, UA None seen None seen/Few  Urine Culture   Specimen: Urine   UR  Result Value Ref Range   Urine Culture, Routine Final report     Organism ID, Bacteria Comment   CBC with Differential/Platelet  Result Value Ref Range   WBC 8.2 3.4 - 10.8 x10E3/uL   RBC 4.43 3.77 - 5.28 x10E6/uL   Hemoglobin 11.9 11.1 - 15.9 g/dL   Hematocrit 36.7 00.8 - 46.6 %   MCV 87 79 - 97 fL   MCH 26.9 26.6 - 33.0 pg   MCHC 31.0 (L) 31.5 - 35.7 g/dL   RDW 06.4 02.4 - 38.1 %   Platelets 244 150 - 450 x10E3/uL  Neutrophils 53 Not Estab. %   Lymphs 35 Not Estab. %   Monocytes 9 Not Estab. %   Eos 2 Not Estab. %   Basos 1 Not Estab. %   Neutrophils Absolute 4.4 1.4 - 7.0 x10E3/uL   Lymphocytes Absolute 2.9 0.7 - 3.1 x10E3/uL   Monocytes Absolute 0.7 0.1 - 0.9 x10E3/uL   EOS (ABSOLUTE) 0.1 0.0 - 0.4 x10E3/uL   Basophils Absolute 0.1 0.0 - 0.2 x10E3/uL   Immature Granulocytes 0 Not Estab. %   Immature Grans (Abs) 0.0 0.0 - 0.1 x10E3/uL  Comprehensive metabolic panel  Result Value Ref Range   Glucose 84 70 - 99 mg/dL   BUN 12 8 - 27 mg/dL   Creatinine, Ser 1.03 (H) 0.57 - 1.00 mg/dL   eGFR 56 (L) >59 mL/min/1.73   BUN/Creatinine Ratio 12 12 - 28   Sodium 137 134 - 144 mmol/L   Potassium 4.6 3.5 - 5.2 mmol/L   Chloride 101 96 - 106 mmol/L   CO2 23 20 - 29 mmol/L   Calcium 9.5 8.7 - 10.3 mg/dL   Total Protein 6.5 6.0 - 8.5 g/dL   Albumin 4.2 3.8 - 4.8 g/dL   Globulin, Total 2.3 1.5 - 4.5 g/dL   Albumin/Globulin Ratio 1.8 1.2 - 2.2   Bilirubin Total 0.4 0.0 - 1.2 mg/dL   Alkaline Phosphatase 87 44 - 121 IU/L   AST 19 0 - 40 IU/L   ALT 11 0 - 32 IU/L  Lipid Panel w/o Chol/HDL Ratio  Result Value Ref Range   Cholesterol, Total 217 (H) 100 - 199 mg/dL   Triglycerides 152 (H) 0 - 149 mg/dL   HDL 45 >39 mg/dL   VLDL Cholesterol Cal 27 5 - 40 mg/dL   LDL Chol Calc (NIH) 145 (H) 0 - 99 mg/dL  Urinalysis, Routine w reflex microscopic  Result Value Ref Range   Specific Gravity, UA 1.010 1.005 - 1.030   pH, UA 6.5 5.0 - 7.5   Color, UA Yellow Yellow   Appearance Ur Clear Clear   Leukocytes,UA 2+ (A) Negative   Protein,UA Negative  Negative/Trace   Glucose, UA Negative Negative   Ketones, UA Negative Negative   RBC, UA Trace (A) Negative   Bilirubin, UA Negative Negative   Urobilinogen, Ur 0.2 0.2 - 1.0 mg/dL   Nitrite, UA Negative Negative   Microscopic Examination See below:   TSH  Result Value Ref Range   TSH 2.670 0.450 - 4.500 uIU/mL  Microalbumin, Urine Waived  Result Value Ref Range   Microalb, Ur Waived 10 0 - 19 mg/L   Creatinine, Urine Waived 50 10 - 300 mg/dL   Microalb/Creat Ratio <30 <30 mg/g      Assessment & Plan:   Problem List Items Addressed This Visit       Genitourinary   Benign hypertensive renal disease    Doing well off medicine. Continue to monitor. Call with any concerns.       Relevant Orders   CBC with Differential/Platelet (Completed)   Urinalysis, Routine w reflex microscopic (Completed)   TSH (Completed)   Microalbumin, Urine Waived (Completed)     Other   Depression, recurrent ([place])    Doing well off medicine. Continue to monitor. Call with any concerns.       Relevant Orders   CBC with Differential/Platelet (Completed)   Hyperlipidemia    Doing well off medicine. Continue to monitor. Call with any concerns. Labs drawn today.  Relevant Orders   CBC with Differential/Platelet (Completed)   Comprehensive metabolic panel (Completed)   Lipid Panel w/o Chol/HDL Ratio (Completed)   Other Visit Diagnoses     Routine general medical examination at a health care facility    -  Primary   Vaccines up to date/declined. Screening labs checked today. DEXA ordered. Continue diet and exercise. Call with any concerns.    Pyuria       Await culture. Treat as needed.    Relevant Orders   Urine Culture (Completed)   Screening for osteoporosis       DEXA ordered today.   Relevant Orders   DG Bone Density   Need for influenza vaccination       Flu shot given today.   Relevant Orders   Flu Vaccine QUAD High Dose(Fluad)        Follow up plan: Return in about 3  months (around 09/15/2022).   LABORATORY TESTING:  - Pap smear: not applicable  IMMUNIZATIONS:   - Tdap: Tetanus vaccination status reviewed: last tetanus booster within 10 years. - Influenza: Administered today - Pneumovax: Up to date - Prevnar: Up to date - COVID: Up to date - HPV: Not applicable - Shingrix vaccine: Not applicable  SCREENING: -Mammogram: Not applicable  - Colonoscopy: Not applicable  - Bone Density: Ordered today   PATIENT COUNSELING:   Advised to take 1 mg of folate supplement per day if capable of pregnancy.   Sexuality: Discussed sexually transmitted diseases, partner selection, use of condoms, avoidance of unintended pregnancy  and contraceptive alternatives.   Advised to avoid cigarette smoking.  I discussed with the patient that most people either abstain from alcohol or drink within safe limits (<=14/week and <=4 drinks/occasion for males, <=7/weeks and <= 3 drinks/occasion for females) and that the risk for alcohol disorders and other health effects rises proportionally with the number of drinks per week and how often a drinker exceeds daily limits.  Discussed cessation/primary prevention of drug use and availability of treatment for abuse.   Diet: Encouraged to adjust caloric intake to maintain  or achieve ideal body weight, to reduce intake of dietary saturated fat and total fat, to limit sodium intake by avoiding high sodium foods and not adding table salt, and to maintain adequate dietary potassium and calcium preferably from fresh fruits, vegetables, and low-fat dairy products.    stressed the importance of regular exercise  Injury prevention: Discussed safety belts, safety helmets, smoke detector, smoking near bedding or upholstery.   Dental health: Discussed importance of regular tooth brushing, flossing, and dental visits.    NEXT PREVENTATIVE PHYSICAL DUE IN 1 YEAR. Return in about 3 months (around 09/15/2022).

## 2022-06-16 LAB — COMPREHENSIVE METABOLIC PANEL
ALT: 11 IU/L (ref 0–32)
AST: 19 IU/L (ref 0–40)
Albumin/Globulin Ratio: 1.8 (ref 1.2–2.2)
Albumin: 4.2 g/dL (ref 3.8–4.8)
Alkaline Phosphatase: 87 IU/L (ref 44–121)
BUN/Creatinine Ratio: 12 (ref 12–28)
BUN: 12 mg/dL (ref 8–27)
Bilirubin Total: 0.4 mg/dL (ref 0.0–1.2)
CO2: 23 mmol/L (ref 20–29)
Calcium: 9.5 mg/dL (ref 8.7–10.3)
Chloride: 101 mmol/L (ref 96–106)
Creatinine, Ser: 1.03 mg/dL — ABNORMAL HIGH (ref 0.57–1.00)
Globulin, Total: 2.3 g/dL (ref 1.5–4.5)
Glucose: 84 mg/dL (ref 70–99)
Potassium: 4.6 mmol/L (ref 3.5–5.2)
Sodium: 137 mmol/L (ref 134–144)
Total Protein: 6.5 g/dL (ref 6.0–8.5)
eGFR: 56 mL/min/{1.73_m2} — ABNORMAL LOW (ref >59)

## 2022-06-16 LAB — CBC WITH DIFFERENTIAL/PLATELET
Basophils Absolute: 0.1 10*3/uL (ref 0.0–0.2)
Basos: 1 %
EOS (ABSOLUTE): 0.1 10*3/uL (ref 0.0–0.4)
Eos: 2 %
Hematocrit: 38.4 % (ref 34.0–46.6)
Hemoglobin: 11.9 g/dL (ref 11.1–15.9)
Immature Grans (Abs): 0 10*3/uL (ref 0.0–0.1)
Immature Granulocytes: 0 %
Lymphocytes Absolute: 2.9 10*3/uL (ref 0.7–3.1)
Lymphs: 35 %
MCH: 26.9 pg (ref 26.6–33.0)
MCHC: 31 g/dL — ABNORMAL LOW (ref 31.5–35.7)
MCV: 87 fL (ref 79–97)
Monocytes Absolute: 0.7 10*3/uL (ref 0.1–0.9)
Monocytes: 9 %
Neutrophils Absolute: 4.4 10*3/uL (ref 1.4–7.0)
Neutrophils: 53 %
Platelets: 244 10*3/uL (ref 150–450)
RBC: 4.43 x10E6/uL (ref 3.77–5.28)
RDW: 12.9 % (ref 11.7–15.4)
WBC: 8.2 10*3/uL (ref 3.4–10.8)

## 2022-06-16 LAB — TSH: TSH: 2.67 u[IU]/mL (ref 0.450–4.500)

## 2022-06-16 LAB — LIPID PANEL W/O CHOL/HDL RATIO
Cholesterol, Total: 217 mg/dL — ABNORMAL HIGH (ref 100–199)
HDL: 45 mg/dL (ref >39)
LDL Chol Calc (NIH): 145 mg/dL — ABNORMAL HIGH (ref 0–99)
Triglycerides: 152 mg/dL — ABNORMAL HIGH (ref 0–149)
VLDL Cholesterol Cal: 27 mg/dL (ref 5–40)

## 2022-06-17 LAB — URINE CULTURE

## 2022-06-19 DIAGNOSIS — Z23 Encounter for immunization: Secondary | ICD-10-CM

## 2022-06-19 DIAGNOSIS — Z0000 Encounter for general adult medical examination without abnormal findings: Secondary | ICD-10-CM | POA: Diagnosis not present

## 2022-06-19 NOTE — Assessment & Plan Note (Signed)
Doing well off medicine. Continue to monitor. Call with any concerns.  

## 2022-06-19 NOTE — Assessment & Plan Note (Signed)
Doing well off medicine. Continue to monitor. Call with any concerns. Labs drawn today.  

## 2022-07-03 ENCOUNTER — Encounter: Payer: Self-pay | Admitting: Family Medicine

## 2022-07-03 DIAGNOSIS — E559 Vitamin D deficiency, unspecified: Secondary | ICD-10-CM | POA: Insufficient documentation

## 2022-09-15 ENCOUNTER — Ambulatory Visit: Payer: Medicare Other | Admitting: Family Medicine

## 2022-09-19 ENCOUNTER — Telehealth: Payer: Self-pay | Admitting: Family Medicine

## 2022-09-19 NOTE — Telephone Encounter (Signed)
Copied from [place] [phone]. Topic: Medicare AWV >> Sep 19, 2022  4:05 PM Josephina Gip wrote: Reason for CRM: N/A unable to leave a message for patient to call back and schedule the Medicare Annual Wellness Visit (AWV) virtually or by telephone.  Last AWV 09/08/21  Please schedule at anytime with [facility]Nurse Health Advisor.  30 minute appointment  Any questions, please call me at [phone]

## 2022-09-28 ENCOUNTER — Ambulatory Visit: Payer: Medicare Other | Admitting: Family Medicine

## 2022-11-06 ENCOUNTER — Ambulatory Visit: Payer: 59 | Admitting: Family Medicine

## 2022-11-23 ENCOUNTER — Telehealth: Payer: Self-pay | Admitting: Family Medicine

## 2022-11-23 NOTE — Telephone Encounter (Signed)
Copied from [place] [phone]. Topic: Medicare AWV >> Nov 23, 2022  1:27 PM Devoria Glassing wrote: Reason for CRM: Called patient to schedule Medicare Annual Wellness Visit (AWV). Left message for patient to call back and schedule Medicare Annual Wellness Visit (AWV).  Last date of AWV: 09/08/2021   Please schedule an appointment at any time with Kirke Shaggy, LPN after [id number] on AWV schedule. .  If any questions, please contact me.  Thank you ,  Sherol Dade; [place] Direct Dial: [phone]

## 2022-11-28 ENCOUNTER — Ambulatory Visit: Payer: 59 | Admitting: Family Medicine

## 2022-12-15 ENCOUNTER — Ambulatory Visit (INDEPENDENT_AMBULATORY_CARE_PROVIDER_SITE_OTHER): Payer: 59 | Admitting: Family Medicine

## 2022-12-15 ENCOUNTER — Ambulatory Visit: Payer: 59 | Admitting: Family Medicine

## 2022-12-15 ENCOUNTER — Encounter: Payer: Self-pay | Admitting: Family Medicine

## 2022-12-15 VITALS — BP 138/81 | HR 69 | Temp 97.5°F | Ht 62.0 in | Wt 159.6 lb

## 2022-12-15 DIAGNOSIS — J3081 Allergic rhinitis due to animal (cat) (dog) hair and dander: Secondary | ICD-10-CM | POA: Insufficient documentation

## 2022-12-15 DIAGNOSIS — E782 Mixed hyperlipidemia: Secondary | ICD-10-CM

## 2022-12-15 DIAGNOSIS — E559 Vitamin D deficiency, unspecified: Secondary | ICD-10-CM | POA: Diagnosis not present

## 2022-12-15 DIAGNOSIS — I129 Hypertensive chronic kidney disease with stage 1 through stage 4 chronic kidney disease, or unspecified chronic kidney disease: Secondary | ICD-10-CM

## 2022-12-15 DIAGNOSIS — D692 Other nonthrombocytopenic purpura: Secondary | ICD-10-CM | POA: Diagnosis not present

## 2022-12-15 DIAGNOSIS — F419 Anxiety disorder, unspecified: Secondary | ICD-10-CM

## 2022-12-15 DIAGNOSIS — R2689 Other abnormalities of gait and mobility: Secondary | ICD-10-CM

## 2022-12-15 DIAGNOSIS — N183 Chronic kidney disease, stage 3 unspecified: Secondary | ICD-10-CM

## 2022-12-15 DIAGNOSIS — Z0000 Encounter for general adult medical examination without abnormal findings: Secondary | ICD-10-CM

## 2022-12-15 DIAGNOSIS — F339 Major depressive disorder, recurrent, unspecified: Secondary | ICD-10-CM

## 2022-12-15 DIAGNOSIS — Z13820 Encounter for screening for osteoporosis: Secondary | ICD-10-CM

## 2022-12-15 LAB — URINALYSIS, ROUTINE W REFLEX MICROSCOPIC
Bilirubin, UA: NEGATIVE
Glucose, UA: NEGATIVE
Ketones, UA: NEGATIVE
Nitrite, UA: NEGATIVE
Protein,UA: NEGATIVE
Specific Gravity, UA: 1.01 (ref 1.005–1.030)
Urobilinogen, Ur: 0.2 mg/dL (ref 0.2–1.0)
pH, UA: 5.5 (ref 5.0–7.5)

## 2022-12-15 LAB — MICROSCOPIC EXAMINATION

## 2022-12-15 MED ORDER — LEVOCETIRIZINE DIHYDROCHLORIDE 5 MG PO TABS: 5.0000 mg | ORAL_TABLET | Freq: Every evening | ORAL | 4 refills | Status: DC

## 2022-12-15 MED ORDER — MUPIROCIN 2 % EX OINT: 1.0000 | TOPICAL_OINTMENT | Freq: Two times a day (BID) | CUTANEOUS | 0 refills | Status: DC

## 2022-12-15 NOTE — Assessment & Plan Note (Signed)
Will start xyzal. Call with any concerns. Continue to monitor.  

## 2022-12-15 NOTE — Patient Instructions (Addendum)
Patient is scheduled with [facility] at [facility] location on June 10th at 11:00 AM. If patient has any questions or concerns regarding the appointment. Patient can reach out to their facility directly at [phone].   Preventative Services:  Health Risk Assessment and Personalized Prevention Plan: Done today Bone Mass Measurements: Ordered today Breast Cancer Screening: N/A CVD Screening: Done today Cervical Cancer Screening: N/A Colon Cancer Screening: N/A Depression Screening: Done today Diabetes Screening: Done today Glaucoma Screening: See your eye doctor Hepatitis B vaccine: N/A Hepatitis C screening: Up to date HIV Screening: Up to date Flu Vaccine: Get in the fall Lung cancer Screening: N/A Obesity Screening: Done today Pneumonia Vaccines (2): up to date STI Screening: N/A

## 2022-12-15 NOTE — Assessment & Plan Note (Signed)
Stable off medicine. Continue to monitor. Call with any concerns.  

## 2022-12-15 NOTE — Assessment & Plan Note (Signed)
Rechecking labs today. Await results. Treat as needed.  °

## 2022-12-15 NOTE — Assessment & Plan Note (Signed)
Doing well off medicine. Continue to monitor. Call with any concerns.  

## 2022-12-15 NOTE — Progress Notes (Signed)
BP 138/81   Pulse 69   Temp (!) 97.5 F (36.4 C) (Oral)   Ht 5\' 2"  (1.575 m)   Wt 159 lb 9.6 oz (72.4 kg)   LMP  (LMP Unknown)   SpO2 95%   BMI 29.19 kg/m    Subjective:    Patient ID: Renee Bridges, female    DOB: 10-01-1945, 77 y.o.   MRN: [id number]  HPI: Renee Bridges is a 77 y.o. female presenting on 12/15/2022 for comprehensive medical examination. Current medical complaints include:  Has been feeling really unsteady on her feet and a bit dizzy- feels like she is going to fall over multiple times a week. Has not fallen. Would like to do some PT to try to help with her balance.   HYPERTENSION / HYPERLIPIDEMIA Satisfied with current treatment? yes Duration of hypertension: chronic BP monitoring frequency: not checking BP medication side effects: no Duration of hyperlipidemia: chronic Cholesterol medication side effects: N/A Cholesterol supplements: none Medication compliance: N/A Aspirin: no Recent stressors: no Recurrent headaches: no Visual changes: no Palpitations: no Dyspnea: no Chest pain: no Lower extremity edema: no Dizzy/lightheaded: no  DEPRESSION Mood status: stable Satisfied with current treatment?: yes Symptom severity: moderate  Duration of current treatment : not currently on this Side effects: no Medication compliance: N/A Psychotherapy/counseling: no  Depressed mood: yes Anxious mood: yes Anhedonia: no Significant weight loss or gain: no Insomnia: no  Fatigue: yes Feelings of worthlessness or guilt: no Impaired concentration/indecisiveness: no Suicidal ideations: no Hopelessness: no Crying spells: no    12/15/2022   11:23 AM 06/15/2022    1:34 PM 05/09/2022    3:10 PM 04/14/2022    3:50 PM 09/08/2021    1:20 PM  Depression screen PHQ 2/9  Decreased Interest 2 0 0 2 1  Down, Depressed, Hopeless 0 0 0 2 2  PHQ - 2 Score 2 0 0 4 3  Altered sleeping 3 3 1 3  0  Tired, decreased energy 0 3 1 3  0  Change in appetite 3 3 1 3  0  Feeling  bad or failure about yourself  0 0 0 0 0  Trouble concentrating 0 0 0 0 0  Moving slowly or fidgety/restless 0 0 0 0 0  Suicidal thoughts 0 0 0 0 0  PHQ-9 Score 8 9 3 13 3   Difficult doing work/chores     Somewhat difficult   Menopausal Symptoms: no  Functional Status Survey: Is the patient deaf or have difficulty hearing?: Yes Does the patient have difficulty seeing, even when wearing glasses/contacts?: Yes Does the patient have difficulty concentrating, remembering, or making decisions?: No Does the patient have difficulty walking or climbing stairs?: Yes Does the patient have difficulty dressing or bathing?: No Does the patient have difficulty doing errands alone such as visiting a doctor's office or shopping?: Yes     12/15/2022   11:24 AM 04/14/2022    3:49 PM 09/08/2021   12:54 PM 01/12/2020   10:11 AM 12/24/2018   10:46 AM  Fall Risk   Falls in the past year? 0 0 0 0 0  Number falls in past yr: 0 0 0 0 0  Injury with Fall? 0 0 0 0 0  Risk for fall due to : No Fall Risks No Fall Risks     Follow up Falls evaluation completed Falls evaluation completed Falls evaluation completed;Falls prevention discussed      Depression Screen    12/15/2022   11:23 AM 06/15/2022  1:34 PM 05/09/2022    3:10 PM 04/14/2022    3:50 PM 09/08/2021    1:20 PM  Depression screen PHQ 2/9  Decreased Interest 2 0 0 2 1  Down, Depressed, Hopeless 0 0 0 2 2  PHQ - 2 Score 2 0 0 4 3  Altered sleeping 3 3 1 3  0  Tired, decreased energy 0 3 1 3  0  Change in appetite 3 3 1 3  0  Feeling bad or failure about yourself  0 0 0 0 0  Trouble concentrating 0 0 0 0 0  Moving slowly or fidgety/restless 0 0 0 0 0  Suicidal thoughts 0 0 0 0 0  PHQ-9 Score 8 9 3 13 3   Difficult doing work/chores     Somewhat difficult    Advanced Directives Does patient have a HCPOA?    no Does patient have a living will or MOST form?  no  Past Medical History:  Past Medical History:  Diagnosis Date   Anxiety     Arthritis    Asthma    Complication of surgical procedure 03/08/2018   Constipation 07/17/2016   Depression    Headache    History of hiatal hernia    Hypertension    Immobility 09/25/2016   Kidney cyst, acquired    Seizures    hx. as child   Senile purpura 04/16/2022   Shortness of breath dyspnea    with excertion    Surgical History:  Past Surgical History:  Procedure Laterality Date   ABDOMINAL HYSTERECTOMY     BACK SURGERY     colonoscopy a year ago     LEG SURGERY Left 03/2015   mass removed from back - 20 years ago     SPINAL CORD STIMULATOR INSERTION N/A 01/17/2017   Procedure: LUMBAR SPINAL CORD STIMULATOR INSERTION;  Surgeon: Venita Lick, MD;  Location: [facility] OR;  Service: Orthopedics;  Laterality: N/A;  Requests 3 hrs   TONSILLECTOMY     TOTAL KNEE REVISION Left 05/13/2015   Procedure: REVISION LEFT  KNEE ARTHROPLASTY, REVISION PATELLA POLY EXCHANGE;  Surgeon: Samson Frederic, MD;  Location: [facility];  Service: Orthopedics;  Laterality: Left;    Medications:  Current Outpatient Medications on File Prior to Visit  Medication Sig   polyethylene glycol powder (GLYCOLAX/MIRALAX) 17 GM/SCOOP powder Take 17 g by mouth 2 (two) times daily as needed.   Vitamin D, Ergocalciferol, (DRISDOL) 1.25 MG (50000 UNIT) CAPS capsule Take 1 capsule (50,000 Units total) by mouth every 7 (seven) days.   No current facility-administered medications on file prior to visit.    Allergies:  Allergies  Allergen Reactions   Bactrim [Sulfamethoxazole-Trimethoprim] Other (See Comments)    Sweating, feeling like throat is closing up   Pollen Extract    Codeine Rash    Social History:  Social History   Socioeconomic History   Marital status: Divorced    Spouse name: Not on file   Number of children: Not on file   Years of education: Not on file   Highest education level: Not on file  Occupational History   Not on file  Tobacco Use   Smoking status: Never   Smokeless tobacco: Never   Vaping Use   Vaping Use: Never used  Substance and Sexual Activity   Alcohol use: No    Alcohol/week: 0.0 standard drinks of alcohol   Drug use: No   Sexual activity: Never  Other Topics Concern   Not on file  Social  History Narrative   Not on file   Social Determinants of Health   Financial Resource Strain: Low Risk  (09/08/2021)   Overall Financial Resource Strain (CARDIA)    Difficulty of Paying Living Expenses: Not hard at all  Food Insecurity: No Food Insecurity (09/08/2021)   Hunger Vital Sign    Worried About Running Out of Food in the Last Year: Never true    Ran Out of Food in the Last Year: Never true  Transportation Needs: No Transportation Needs (09/08/2021)   PRAPARE - Administrator, Civil Service (Medical): No    Lack of Transportation (Non-Medical): No  Recent Concern: Transportation Needs - Unmet Transportation Needs (06/24/2021)   PRAPARE - Administrator, Civil Service (Medical): Not on file    Lack of Transportation (Non-Medical): Yes  Physical Activity: Inactive (09/08/2021)   Exercise Vital Sign    Days of Exercise per Week: 0 days    Minutes of Exercise per Session: 0 min  Stress: No Stress Concern Present (09/08/2021)   [employer] of Occupational Health - Occupational Stress Questionnaire    Feeling of Stress : Only a little  Social Connections: Socially Isolated (09/08/2021)   Social Connection and Isolation Panel [NHANES]    Frequency of Communication with Friends and Family: Once a week    Frequency of Social Gatherings with Friends and Family: Once a week    Attends Religious Services: Never    Database administrator or Organizations: No    Attends Banker Meetings: Never    Marital Status: Divorced  Catering manager Violence: Not At Risk (09/08/2021)   Humiliation, Afraid, Rape, and Kick questionnaire    Fear of Current or Ex-Partner: No    Emotionally Abused: No    Physically Abused: No    Sexually Abused: No    Social History   Tobacco Use  Smoking Status Never  Smokeless Tobacco Never   Social History   Substance and Sexual Activity  Alcohol Use No   Alcohol/week: 0.0 standard drinks of alcohol    Family History:  Family History  Problem Relation Age of Onset   Cancer Mother    Cancer Father    Diabetes Brother    Cancer Brother    Cancer Brother     Past medical history, surgical history, medications, allergies, family history and social history reviewed with patient today and changes made to appropriate areas of the chart.   Review of Systems  Constitutional: Negative.   HENT: Negative.    Eyes: Negative.   Respiratory:  Positive for shortness of breath. Negative for cough, hemoptysis, sputum production and wheezing.   Cardiovascular: Negative.   Gastrointestinal: Negative.   Genitourinary: Negative.   Musculoskeletal: Negative.   Skin: Negative.   Neurological: Negative.        Feels unsteady on her feet  Endo/Heme/Allergies: Negative.   Psychiatric/Behavioral: Negative.      All other ROS negative except what is listed above and in the HPI.      Objective:    BP 138/81   Pulse 69   Temp (!) 97.5 F (36.4 C) (Oral)   Ht  (1.575 m)   Wt 159 lb 9.6 oz (72.4 kg)   LMP  (LMP Unknown)   SpO2 95%   BMI 29.19 kg/m   Wt Readings from Last 3 Encounters:  12/15/22 159 lb 9.6 oz (72.4 kg)  06/15/22 162 lb 11.2 oz (73.8 kg)  05/09/22 161 lb  3.2 oz (73.1 kg)     Physical Exam Vitals and nursing note reviewed.  Constitutional:      General: She is not in acute distress.    Appearance: Normal appearance. She is not ill-appearing, toxic-appearing or diaphoretic.  HENT:     Head: Normocephalic and atraumatic.     Right Ear: External ear normal.     Left Ear: External ear normal.     Ears:     Comments: Erythematous rash in bilateral EACs    Nose: Nose normal.     Mouth/Throat:     Mouth: Mucous membranes are moist.     Pharynx: Oropharynx is clear.   Eyes:     General: No scleral icterus.       Right eye: No discharge.        Left eye: No discharge.     Extraocular Movements: Extraocular movements intact.     Conjunctiva/sclera: Conjunctivae normal.     Pupils: Pupils are equal, round, and reactive to light.  Cardiovascular:     Rate and Rhythm: Normal rate and regular rhythm.     Pulses: Normal pulses.     Heart sounds: Normal heart sounds. No murmur heard.    No friction rub. No gallop.  Pulmonary:     Effort: Pulmonary effort is normal. No respiratory distress.     Breath sounds: Normal breath sounds. No stridor. No wheezing, rhonchi or rales.  Chest:     Chest wall: No tenderness.  Musculoskeletal:        General: Normal range of motion.     Cervical back: Normal range of motion and neck supple.  Skin:    General: Skin is warm and dry.     Capillary Refill: Capillary refill takes less than 2 seconds.     Coloration: Skin is not jaundiced or pale.     Findings: No bruising, erythema, lesion or rash.  Neurological:     General: No focal deficit present.     Mental Status: She is alert and oriented to person, place, and time. Mental status is at baseline.  Psychiatric:        Mood and Affect: Mood normal.        Behavior: Behavior normal.        Thought Content: Thought content normal.        Judgment: Judgment normal.        12/15/2022   12:04 PM 09/08/2021   12:56 PM 05/04/2017   10:03 AM  6CIT Screen  What Year? 0 points 0 points 0 points  What month? 0 points 0 points 0 points  What time? 0 points 0 points 0 points  Count back from 20 0 points 0 points 0 points  Months in reverse 0 points 0 points 0 points  Repeat phrase 0 points 4 points 0 points  Total Score 0 points 4 points 0 points     Results for orders placed or performed in visit on 06/15/22  Microscopic Examination   Urine  Result Value Ref Range   WBC, UA 0-5 0 - 5 /hpf   RBC, Urine 0-2 0 - 2 /hpf   Epithelial Cells (non renal) 0-10 0 - 10  /hpf   Bacteria, UA None seen None seen/Few  Urine Culture   Specimen: Urine   UR  Result Value Ref Range   Urine Culture, Routine Final report    Organism ID, Bacteria Comment   CBC with Differential/Platelet  Result Value Ref Range  WBC 8.2 3.4 - 10.8 x10E3/uL   RBC 4.43 3.77 - 5.28 x10E6/uL   Hemoglobin 11.9 11.1 - 15.9 g/dL   Hematocrit 16.1 09.6 - 46.6 %   MCV 87 79 - 97 fL   MCH 26.9 26.6 - 33.0 pg   MCHC 31.0 (L) 31.5 - 35.7 g/dL   RDW 04.5 40.9 - 81.1 %   Platelets 244 150 - 450 x10E3/uL   Neutrophils 53 Not Estab. %   Lymphs 35 Not Estab. %   Monocytes 9 Not Estab. %   Eos 2 Not Estab. %   Basos 1 Not Estab. %   Neutrophils Absolute 4.4 1.4 - 7.0 x10E3/uL   Lymphocytes Absolute 2.9 0.7 - 3.1 x10E3/uL   Monocytes Absolute 0.7 0.1 - 0.9 x10E3/uL   EOS (ABSOLUTE) 0.1 0.0 - 0.4 x10E3/uL   Basophils Absolute 0.1 0.0 - 0.2 x10E3/uL   Immature Granulocytes 0 Not Estab. %   Immature Grans (Abs) 0.0 0.0 - 0.1 x10E3/uL  Comprehensive metabolic panel  Result Value Ref Range   Glucose 84 70 - 99 mg/dL   BUN 12 8 - 27 mg/dL   Creatinine, Ser 9.14 (H) 0.57 - 1.00 mg/dL   eGFR 56 (L) >78 GN/FAO/1.30   BUN/Creatinine Ratio 12 12 - 28   Sodium 137 134 - 144 mmol/L   Potassium 4.6 3.5 - 5.2 mmol/L   Chloride 101 96 - 106 mmol/L   CO2 23 20 - 29 mmol/L   Calcium 9.5 8.7 - 10.3 mg/dL   Total Protein 6.5 6.0 - 8.5 g/dL   Albumin 4.2 3.8 - 4.8 g/dL   Globulin, Total 2.3 1.5 - 4.5 g/dL   Albumin/Globulin Ratio 1.8 1.2 - 2.2   Bilirubin Total 0.4 0.0 - 1.2 mg/dL   Alkaline Phosphatase 87 44 - 121 IU/L   AST 19 0 - 40 IU/L   ALT 11 0 - 32 IU/L  Lipid Panel w/o Chol/HDL Ratio  Result Value Ref Range   Cholesterol, Total 217 (H) 100 - 199 mg/dL   Triglycerides 865 (H) 0 - 149 mg/dL   HDL 45 >78 mg/dL   VLDL Cholesterol Cal 27 5 - 40 mg/dL   LDL Chol Calc (NIH) 469 (H) 0 - 99 mg/dL  Urinalysis, Routine w reflex microscopic  Result Value Ref Range   Specific Gravity, UA 1.010  1.005 - 1.030   pH, UA 6.5 5.0 - 7.5   Color, UA Yellow Yellow   Appearance Ur Clear Clear   Leukocytes,UA 2+ (A) Negative   Protein,UA Negative Negative/Trace   Glucose, UA Negative Negative   Ketones, UA Negative Negative   RBC, UA Trace (A) Negative   Bilirubin, UA Negative Negative   Urobilinogen, Ur 0.2 0.2 - 1.0 mg/dL   Nitrite, UA Negative Negative   Microscopic Examination See below:   TSH  Result Value Ref Range   TSH 2.670 0.450 - 4.500 uIU/mL  Microalbumin, Urine Waived  Result Value Ref Range   Microalb, Ur Waived 10 0 - 19 mg/L   Creatinine, Urine Waived 50 10 - 300 mg/dL   Microalb/Creat Ratio <30 <30 mg/g      Assessment & Plan:   Problem List Items Addressed This Visit       Cardiovascular and Mediastinum   Senile purpura    Reassured patient. Continue to monitor. Call with any concerns.       Relevant Orders   Comprehensive metabolic panel   CBC with Differential/Platelet   Ambulatory referral to Home  Health     Respiratory   Allergic rhinitis due to animal hair and dander    Will start xyzal. Call with any concerns. Continue to monitor.         Genitourinary   Benign hypertensive renal disease    Doing well off medicine. Continue to monitor. Call with any concerns.      Relevant Orders   Comprehensive metabolic panel   CBC with Differential/Platelet   Urinalysis, Routine w reflex microscopic   Ambulatory referral to Home Health   Chronic kidney disease, stage 3    Rechecking labs today. Await results. Treat as needed.       Relevant Orders   Comprehensive metabolic panel   CBC with Differential/Platelet   Ambulatory referral to Home Health     Other   Anxiety    Stable off medicine. Continue to monitor. Call with any concerns.      Depression, recurrent    Stable off medicine. Continue to monitor. Call with any concerns.      Relevant Orders   Comprehensive metabolic panel   CBC with Differential/Platelet   Ambulatory  referral to Home Health   Hyperlipidemia    Doing well off medicine. Continue to monitor. Call with any concerns.      Relevant Orders   Comprehensive metabolic panel   CBC with Differential/Platelet   Lipid Panel w/o Chol/HDL Ratio   Ambulatory referral to Home Health   Vitamin D deficiency    Rechecking labs today. Await results. Treat as needed.       Relevant Orders   VITAMIN D 25 Hydroxy (Vit-D Deficiency, Fractures)   Comprehensive metabolic panel   CBC with Differential/Platelet   Ambulatory referral to Home Health   Other Visit Diagnoses     Encounter for Medicare annual wellness exam    -  Primary   Preventative care discussed today as below.   Balance problem       Has been afriad she is going to fall often at home. Will get her set up with home health PT. Await their input.   Relevant Orders   Ambulatory referral to Home Health   Screening for osteoporosis       DEXA scheduled for patient today.   Relevant Orders   DG Bone Density        Preventative Services:  Health Risk Assessment and Personalized Prevention Plan: Done today Bone Mass Measurements: Ordered today Breast Cancer Screening: N/A CVD Screening: Done today Cervical Cancer Screening: N/A Colon Cancer Screening: N/A Depression Screening: Done today Diabetes Screening: Done today Glaucoma Screening: See your eye doctor Hepatitis B vaccine: N/A Hepatitis C screening: Up to date HIV Screening: Up to date Flu Vaccine: Get in the fall Lung cancer Screening: N/A Obesity Screening: Done today Pneumonia Vaccines (2): up to date STI Screening: N/A  Follow up plan: Return in about 6 months (around 06/16/2023) for physical.   LABORATORY TESTING:  - Pap smear: not applicable  IMMUNIZATIONS:   - Tdap: Tetanus vaccination status reviewed: last tetanus booster within 10 years. - Influenza: Up to date - Pneumovax: Up to date - Prevnar: Up to date - Zostavax vaccine:  Refused  SCREENING: -Mammogram: Not applicable  - Colonoscopy: Not applicable  - Bone Density: Ordered today   PATIENT COUNSELING:   Advised to take 1 mg of folate supplement per day if capable of pregnancy.   Sexuality: Discussed sexually transmitted diseases, partner selection, use of condoms, avoidance of unintended pregnancy  and contraceptive  alternatives.   Advised to avoid cigarette smoking.  I discussed with the patient that most people either abstain from alcohol or drink within safe limits (<=14/week and <=4 drinks/occasion for males, <=7/weeks and <= 3 drinks/occasion for females) and that the risk for alcohol disorders and other health effects rises proportionally with the number of drinks per week and how often a drinker exceeds daily limits.  Discussed cessation/primary prevention of drug use and availability of treatment for abuse.   Diet: Encouraged to adjust caloric intake to maintain  or achieve ideal body weight, to reduce intake of dietary saturated fat and total fat, to limit sodium intake by avoiding high sodium foods and not adding table salt, and to maintain adequate dietary potassium and calcium preferably from fresh fruits, vegetables, and low-fat dairy products.    stressed the importance of regular exercise  Injury prevention: Discussed safety belts, safety helmets, smoke detector, smoking near bedding or upholstery.   Dental health: Discussed importance of regular tooth brushing, flossing, and dental visits.    NEXT PREVENTATIVE PHYSICAL DUE IN 1 YEAR. Return in about 6 months (around 06/16/2023) for physical.

## 2022-12-15 NOTE — Assessment & Plan Note (Signed)
Reassured patient. Continue to monitor. Call with any concerns.  

## 2022-12-16 LAB — COMPREHENSIVE METABOLIC PANEL
ALT: 10 IU/L (ref 0–32)
AST: 19 IU/L (ref 0–40)
Albumin/Globulin Ratio: 1.9 (ref 1.2–2.2)
Albumin: 4.3 g/dL (ref 3.8–4.8)
Alkaline Phosphatase: 81 IU/L (ref 44–121)
BUN/Creatinine Ratio: 10 — ABNORMAL LOW (ref 12–28)
BUN: 12 mg/dL (ref 8–27)
Bilirubin Total: 0.3 mg/dL (ref 0.0–1.2)
CO2: 22 mmol/L (ref 20–29)
Calcium: 9.7 mg/dL (ref 8.7–10.3)
Chloride: 103 mmol/L (ref 96–106)
Creatinine, Ser: 1.19 mg/dL — ABNORMAL HIGH (ref 0.57–1.00)
Globulin, Total: 2.3 g/dL (ref 1.5–4.5)
Glucose: 93 mg/dL (ref 70–99)
Potassium: 4.9 mmol/L (ref 3.5–5.2)
Sodium: 140 mmol/L (ref 134–144)
Total Protein: 6.6 g/dL (ref 6.0–8.5)
eGFR: 47 mL/min/{1.73_m2} — ABNORMAL LOW (ref >59)

## 2022-12-16 LAB — CBC WITH DIFFERENTIAL/PLATELET
Basophils Absolute: 0.1 10*3/uL (ref 0.0–0.2)
Basos: 1 %
EOS (ABSOLUTE): 0.1 10*3/uL (ref 0.0–0.4)
Eos: 1 %
Hematocrit: 39.9 % (ref 34.0–46.6)
Hemoglobin: 12.6 g/dL (ref 11.1–15.9)
Immature Grans (Abs): 0 10*3/uL (ref 0.0–0.1)
Immature Granulocytes: 0 %
Lymphocytes Absolute: 2.7 10*3/uL (ref 0.7–3.1)
Lymphs: 35 %
MCH: 27.2 pg (ref 26.6–33.0)
MCHC: 31.6 g/dL (ref 31.5–35.7)
MCV: 86 fL (ref 79–97)
Monocytes Absolute: 0.6 10*3/uL (ref 0.1–0.9)
Monocytes: 8 %
Neutrophils Absolute: 4.3 10*3/uL (ref 1.4–7.0)
Neutrophils: 55 %
Platelets: 246 10*3/uL (ref 150–450)
RBC: 4.64 x10E6/uL (ref 3.77–5.28)
RDW: 14.7 % (ref 11.7–15.4)
WBC: 7.7 10*3/uL (ref 3.4–10.8)

## 2022-12-16 LAB — VITAMIN D 25 HYDROXY (VIT D DEFICIENCY, FRACTURES): Vit D, 25-Hydroxy: 18.3 ng/mL — ABNORMAL LOW (ref 30.0–100.0)

## 2022-12-16 LAB — LIPID PANEL W/O CHOL/HDL RATIO
Cholesterol, Total: 193 mg/dL (ref 100–199)
HDL: 49 mg/dL (ref >39)
LDL Chol Calc (NIH): 123 mg/dL — ABNORMAL HIGH (ref 0–99)
Triglycerides: 117 mg/dL (ref 0–149)
VLDL Cholesterol Cal: 21 mg/dL (ref 5–40)

## 2022-12-18 ENCOUNTER — Other Ambulatory Visit: Payer: Self-pay | Admitting: Family Medicine

## 2022-12-18 MED ORDER — VITAMIN D (ERGOCALCIFEROL) 1.25 MG (50000 UNIT) PO CAPS: 50000.0000 [IU] | ORAL_CAPSULE | ORAL | 1 refills | Status: DC

## 2022-12-21 ENCOUNTER — Ambulatory Visit: Payer: 59 | Admitting: Family Medicine

## 2022-12-25 ENCOUNTER — Ambulatory Visit (INDEPENDENT_AMBULATORY_CARE_PROVIDER_SITE_OTHER): Payer: 59

## 2022-12-25 VITALS — Ht 62.0 in | Wt 159.0 lb

## 2022-12-25 DIAGNOSIS — Z0000 Encounter for general adult medical examination without abnormal findings: Secondary | ICD-10-CM

## 2022-12-25 NOTE — Progress Notes (Signed)
I connected with  Renee Bridges on 12/25/22 by a audio enabled telemedicine application and verified that I am speaking with the correct person using two identifiers.  Patient Location: Home  Provider Location: Office/Clinic  I discussed the limitations of evaluation and management by telemedicine. The patient expressed understanding and agreed to proceed.  Subjective:   Renee Bridges is a 77 y.o. female who presents for Medicare Annual (Subsequent) preventive examination.  Review of Systems     Cardiac Risk Factors include: sedentary lifestyle     Objective:    There were no vitals filed for this visit. There is no height or weight on file to calculate BMI.     12/25/2022    2:28 PM 09/08/2021   12:54 PM 01/12/2020   11:01 AM 08/25/2017    4:23 PM 05/04/2017    9:53 AM 01/10/2017   12:27 PM 05/13/2015   11:07 AM  Advanced Directives  Does Patient Have a Medical Advance Directive? No No No No No No No  Would patient like information on creating a medical advance directive? No - Patient declined No - Patient declined Yes (MAU/Ambulatory/Procedural Areas - Information given) No - Patient declined Yes (MAU/Ambulatory/Procedural Areas - Information given) No - Patient declined No - patient declined information    Current Medications (verified) Outpatient Encounter Medications as of 12/25/2022  Medication Sig   levocetirizine (XYZAL) 5 MG tablet Take 1 tablet (5 mg total) by mouth every evening.   mupirocin ointment (BACTROBAN) 2 % Apply 1 Application topically 2 (two) times daily. At the opening of the ear canals   polyethylene glycol powder (GLYCOLAX/MIRALAX) 17 GM/SCOOP powder Take 17 g by mouth 2 (two) times daily as needed.   Vitamin D, Ergocalciferol, (DRISDOL) 1.25 MG (50000 UNIT) CAPS capsule Take 1 capsule (50,000 Units total) by mouth every 7 (seven) days.   No facility-administered encounter medications on file as of 12/25/2022.    Allergies (verified) Bactrim  [sulfamethoxazole-trimethoprim], Pollen extract, and Codeine   History: Past Medical History:  Diagnosis Date   Anxiety    Arthritis    Asthma    Complication of surgical procedure 03/08/2018   Constipation 07/17/2016   Depression    Headache    History of hiatal hernia    Hypertension    Immobility 09/25/2016   Kidney cyst, acquired    Seizures    hx. as child   Senile purpura 04/16/2022   Shortness of breath dyspnea    with excertion   Past Surgical History:  Procedure Laterality Date   ABDOMINAL HYSTERECTOMY     BACK SURGERY     colonoscopy a year ago     LEG SURGERY Left 03/2015   mass removed from back - 20 years ago     SPINAL CORD STIMULATOR INSERTION N/A 01/17/2017   Procedure: LUMBAR SPINAL CORD STIMULATOR INSERTION;  Surgeon: Venita Lick, MD;  Location: [facility] OR;  Service: Orthopedics;  Laterality: N/A;  Requests 3 hrs   TONSILLECTOMY     TOTAL KNEE REVISION Left 05/13/2015   Procedure: REVISION LEFT  KNEE ARTHROPLASTY, REVISION PATELLA POLY EXCHANGE;  Surgeon: Samson Frederic, MD;  Location: [facility];  Service: Orthopedics;  Laterality: Left;   Family History  Problem Relation Age of Onset   Cancer Mother    Cancer Father    Diabetes Brother    Cancer Brother    Cancer Brother    Social History   Socioeconomic History   Marital status: Divorced    Spouse  name: Not on file   Number of children: Not on file   Years of education: Not on file   Highest education level: Not on file  Occupational History   Not on file  Tobacco Use   Smoking status: Never   Smokeless tobacco: Never  Vaping Use   Vaping Use: Never used  Substance and Sexual Activity   Alcohol use: No    Alcohol/week: 0.0 standard drinks of alcohol   Drug use: No   Sexual activity: Never  Other Topics Concern   Not on file  Social History Narrative   Not on file   Social Determinants of Health   Financial Resource Strain: Low Risk  (12/25/2022)   Overall Financial Resource Strain (CARDIA)     Difficulty of Paying Living Expenses: Not hard at all  Food Insecurity: No Food Insecurity (12/25/2022)   Hunger Vital Sign    Worried About Running Out of Food in the Last Year: Never true    Ran Out of Food in the Last Year: Never true  Transportation Needs: No Transportation Needs (12/25/2022)   PRAPARE - Administrator, Civil Service (Medical): No    Lack of Transportation (Non-Medical): No  Physical Activity: Inactive (12/25/2022)   Exercise Vital Sign    Days of Exercise per Week: 0 days    Minutes of Exercise per Session: 0 min  Stress: No Stress Concern Present (12/25/2022)   [employer] of Occupational Health - Occupational Stress Questionnaire    Feeling of Stress : Not at all  Social Connections: Unknown (12/25/2022)   Social Connection and Isolation Panel [NHANES]    Frequency of Communication with Friends and Family: Not on file    Frequency of Social Gatherings with Friends and Family: Once a week    Attends Religious Services: Never    Database administrator or Organizations: No    Attends Engineer, structural: Never    Marital Status: Divorced    Tobacco Counseling Counseling given: Not Answered   Clinical Intake:  Pre-visit preparation completed: Yes  Pain : No/denies pain     Nutritional Risks: None Diabetes: No  How often do you need to have someone help you when you read instructions, pamphlets, or other written materials from your doctor or pharmacy?: 1 - Never  Diabetic?no  Interpreter Needed?: No  Information entered by :: Kennedy Bucker, LPN   Activities of Daily Living    12/25/2022    2:29 PM 12/15/2022   11:58 AM  In your present state of health, do you have any difficulty performing the following activities:  Hearing? 1 1  Vision? 1 1  Difficulty concentrating or making decisions? 0 0  Walking or climbing stairs? 1 1  Dressing or bathing? 0 0  Doing errands, shopping? 1 1  Preparing Food and eating ? N    Using the Toilet? N   In the past six months, have you accidently leaked urine? N   Do you have problems with loss of bowel control? N   Managing your Medications? N   Managing your Finances? N   Housekeeping or managing your Housekeeping? N     Patient Care Team: [name], DO as PCP - General (Family Medicine) Samson Frederic, MD as Consulting Physician (Orthopedic Surgery)  Indicate any recent Medical Services you may have received from other than [facility] providers in the past year (date may be approximate).     Assessment:   This is a  routine wellness examination for [facility].  Hearing/Vision screen Hearing Screening - Comments:: Wears aids Vision Screening - Comments:: Wears glasses- [facility]   Dietary issues and exercise activities discussed: Current Exercise Habits: The patient does not participate in regular exercise at present   Goals Addressed             This Visit's Progress    DIET - EAT MORE FRUITS AND VEGETABLES         Depression Screen    12/25/2022    2:26 PM 12/15/2022   11:23 AM 06/15/2022    1:34 PM 05/09/2022    3:10 PM 04/14/2022    3:50 PM 09/08/2021    1:20 PM 09/08/2021    1:05 PM  PHQ 2/9 Scores  PHQ - 2 Score 0 2 0 0 4 3 3   PHQ- 9 Score 0 8 9 3 13 3 3     Fall Risk    12/25/2022    2:29 PM 12/15/2022   11:24 AM 04/14/2022    3:49 PM 09/08/2021   12:54 PM 01/12/2020   10:11 AM  Fall Risk   Falls in the past year? 0 0 0 0 0  Number falls in past yr: 0 0 0 0 0  Injury with Fall? 0 0 0 0 0  Risk for fall due to : No Fall Risks No Fall Risks No Fall Risks    Follow up Falls prevention discussed;Falls evaluation completed Falls evaluation completed Falls evaluation completed Falls evaluation completed;Falls prevention discussed     FALL RISK PREVENTION PERTAINING TO THE HOME:  Any stairs in or around the home? No  If so, are there any without handrails? No  Home free of loose throw rugs in walkways, pet beds, electrical cords, etc? Yes   Adequate lighting in your home to reduce risk of falls? Yes   ASSISTIVE DEVICES UTILIZED TO PREVENT FALLS:  Life alert? No  Use of a cane, walker or w/c? No  Grab bars in the bathroom? Yes  Shower chair or bench in shower? Yes  Elevated toilet seat or a handicapped toilet? No    Cognitive Function:    09/30/2018    4:38 PM 09/12/2018    2:43 PM 12/28/2017   10:50 AM  MMSE - Mini Mental State Exam  Orientation to time 5 5 5   Orientation to Place 5 4 5   Registration 3 3 3   Attention/ Calculation 4 2 3   Recall 3 3 2   Language- name 2 objects 2 2 2   Language- repeat 1 0 0  Language- follow 3 step command 2 1 3   Language- read & follow direction 1 0 1  Write a sentence 1 0 1  Copy design 0 0 0  Total score 27 20 25         12/15/2022   12:04 PM 09/08/2021   12:56 PM 05/04/2017   10:03 AM  6CIT Screen  What Year? 0 points 0 points 0 points  What month? 0 points 0 points 0 points  What time? 0 points 0 points 0 points  Count back from 20 0 points 0 points 0 points  Months in reverse 0 points 0 points 0 points  Repeat phrase 0 points 4 points 0 points  Total Score 0 points 4 points 0 points    Immunizations Immunization History  Administered Date(s) Administered   Fluad Quad(high Dose 65+) 05/22/2019, 06/24/2020, 06/19/2022   Influenza, High Dose Seasonal PF 07/17/2016, 09/14/2017, 06/10/2018   Influenza,inj,Quad PF,6+ Mos 05/15/2015  PFIZER(Purple Top)SARS-COV-2 Vaccination 11/13/2019, 12/11/2019   Pneumococcal Conjugate-13 12/22/2013   Pneumococcal Polysaccharide-23 12/22/2008, 12/27/2015   Td 02/27/2020   Zoster, Live 12/22/2013    TDAP status: Up to date  Flu Vaccine status: Up to date  Pneumococcal vaccine status: Up to date  Covid-19 vaccine status: Completed vaccines  Qualifies for Shingles Vaccine? Yes   Zostavax completed No   Shingrix Completed?: No.    Education has been provided regarding the importance of this vaccine. Patient has been advised to  call insurance company to determine out of pocket expense if they have not yet received this vaccine. Advised may also receive vaccine at local pharmacy or Health Dept. Verbalized acceptance and understanding.  Screening Tests Health Maintenance  Topic Date Due   Zoster Vaccines- Shingrix (1 of 2) Never done   COVID-19 Vaccine (3 - 2023-24 season) 05/05/2022   INFLUENZA VACCINE  04/05/2023   COLONOSCOPY (Pts 45-63yrs Insurance coverage will need to be confirmed)  06/17/2023   Medicare Annual Wellness (AWV)  12/25/2023   DTaP/Tdap/Td (2 - Tdap) 02/26/2030   Pneumonia Vaccine 59+ Years old  Completed   DEXA SCAN  Completed   Hepatitis C Screening  Completed   HPV VACCINES  Aged Out    Health Maintenance  Health Maintenance Due  Topic Date Due   Zoster Vaccines- Shingrix (1 of 2) Never done   COVID-19 Vaccine (3 - 2023-24 season) 05/05/2022    Colorectal cancer screening: No longer required.   Mammogram status: No longer required due to age.  Bone Density status: Ordered scheduled for 02/12/23. Pt provided with contact info and advised to call to schedule appt.  Lung Cancer Screening: (Low Dose CT Chest recommended if Age 60-80 years, 30 pack-year currently smoking OR have quit w/in 15years.) does not qualify.   Additional Screening:  Hepatitis C Screening: does qualify; Completed 12/27/15  Vision Screening: Recommended annual ophthalmology exams for early detection of glaucoma and other disorders of the eye. Is the patient up to date with their annual eye exam?  Yes  Who is the provider or what is the name of the office in which the patient attends annual eye exams? [facility] If pt is not established with a provider, would they like to be referred to a provider to establish care? No .   Dental Screening: Recommended annual dental exams for proper oral hygiene  Community Resource Referral / Chronic Care Management: CRR required this visit?  No   CCM required this visit?  No       Plan:     I have personally reviewed and noted the following in the patient's chart:   Medical and social history Use of alcohol, tobacco or illicit drugs  Current medications and supplements including opioid prescriptions. Patient is not currently taking opioid prescriptions. Functional ability and status Nutritional status Physical activity Advanced directives List of other physicians Hospitalizations, surgeries, and ER visits in previous 12 months Vitals Screenings to include cognitive, depression, and falls Referrals and appointments  In addition, I have reviewed and discussed with patient certain preventive protocols, quality metrics, and best practice recommendations. A written personalized care plan for preventive services as well as general preventive health recommendations were provided to patient.     Hal Hope, LPN   1/61/0960   Nurse Notes: none

## 2022-12-25 NOTE — Patient Instructions (Signed)
[name] , Thank you for taking time to come for your Medicare Wellness Visit. I appreciate your ongoing commitment to your health goals. Please review the following plan we discussed and let me know if I can assist you in the future.   These are the goals we discussed:  Goals      DIET - EAT MORE FRUITS AND VEGETABLES        This is a list of the screening recommended for you and due dates:  Health Maintenance  Topic Date Due   Zoster (Shingles) Vaccine (1 of 2) Never done   COVID-19 Vaccine (3 - 2023-24 season) 05/05/2022   Flu Shot  04/05/2023   Colon Cancer Screening  06/17/2023   Medicare Annual Wellness Visit  12/25/2023   DTaP/Tdap/Td vaccine (2 - Tdap) 02/26/2030   Pneumonia Vaccine  Completed   DEXA scan (bone density measurement)  Completed   Hepatitis C Screening: USPSTF Recommendation to screen - Ages 70-79 yo.  Completed   HPV Vaccine  Aged Out    Advanced directives: no  Conditions/risks identified: none  Next appointment: Follow up in one year for your annual wellness visit 12/31/23 @ 1:00 pm by phone   Preventive Care 65 Years and Older, Female Preventive care refers to lifestyle choices and visits with your health care provider that can promote health and wellness. What does preventive care include? A yearly physical exam. This is also called an annual well check. Dental exams once or twice a year. Routine eye exams. Ask your health care provider how often you should have your eyes checked. Personal lifestyle choices, including: Daily care of your teeth and gums. Regular physical activity. Eating a healthy diet. Avoiding tobacco and drug use. Limiting alcohol use. Practicing safe sex. Taking low-dose aspirin every day. Taking vitamin and mineral supplements as recommended by your health care provider. What happens during an annual well check? The services and screenings done by your health care provider during your annual well check will depend on  your age, overall health, lifestyle risk factors, and family history of disease. Counseling  Your health care provider may ask you questions about your: Alcohol use. Tobacco use. Drug use. Emotional well-being. Home and relationship well-being. Sexual activity. Eating habits. History of falls. Memory and ability to understand (cognition). Work and work Astronomer. Reproductive health. Screening  You may have the following tests or measurements: Height, weight, and BMI. Blood pressure. Lipid and cholesterol levels. These may be checked every 5 years, or more frequently if you are over 69 years old. Skin check. Lung cancer screening. You may have this screening every year starting at age 1 if you have a 30-pack-year history of smoking and currently smoke or have quit within the past 15 years. Fecal occult blood test (FOBT) of the stool. You may have this test every year starting at age 95. Flexible sigmoidoscopy or colonoscopy. You may have a sigmoidoscopy every 5 years or a colonoscopy every 10 years starting at age 80. Hepatitis C blood test. Hepatitis B blood test. Sexually transmitted disease (STD) testing. Diabetes screening. This is done by checking your blood sugar (glucose) after you have not eaten for a while (fasting). You may have this done every 1-3 years. Bone density scan. This is done to screen for osteoporosis. You may have this done starting at age 68. Mammogram. This may be done every 1-2 years. Talk to your health care provider about how often you should have regular mammograms. Talk with your health  care provider about your test results, treatment options, and if necessary, the need for more tests. Vaccines  Your health care provider may recommend certain vaccines, such as: Influenza vaccine. This is recommended every year. Tetanus, diphtheria, and acellular pertussis (Tdap, Td) vaccine. You may need a Td booster every 10 years. Zoster vaccine. You may need this  after age 83. Pneumococcal 13-valent conjugate (PCV13) vaccine. One dose is recommended after age 29. Pneumococcal polysaccharide (PPSV23) vaccine. One dose is recommended after age 8. Talk to your health care provider about which screenings and vaccines you need and how often you need them. This information is not intended to replace advice given to you by your health care provider. Make sure you discuss any questions you have with your health care provider. Document Released: 09/17/2015 Document Revised: 05/10/2016 Document Reviewed: 06/22/2015 Elsevier Interactive Patient Education  2017 [place] Prevention in the Home Falls can cause injuries. They can happen to people of all ages. There are many things you can do to make your home safe and to help prevent falls. What can I do on the outside of my home? Regularly fix the edges of walkways and driveways and fix any cracks. Remove anything that might make you trip as you walk through a door, such as a raised step or threshold. Trim any bushes or trees on the path to your home. Use bright outdoor lighting. Clear any walking paths of anything that might make someone trip, such as rocks or tools. Regularly check to see if handrails are loose or broken. Make sure that both sides of any steps have handrails. Any raised decks and porches should have guardrails on the edges. Have any leaves, snow, or ice cleared regularly. Use sand or salt on walking paths during winter. Clean up any spills in your garage right away. This includes oil or grease spills. What can I do in the bathroom? Use night lights. Install grab bars by the toilet and in the tub and shower. Do not use towel bars as grab bars. Use non-skid mats or decals in the tub or shower. If you need to sit down in the shower, use a plastic, non-slip stool. Keep the floor dry. Clean up any water that spills on the floor as soon as it happens. Remove soap buildup in the tub or  shower regularly. Attach bath mats securely with double-sided non-slip rug tape. Do not have throw rugs and other things on the floor that can make you trip. What can I do in the bedroom? Use night lights. Make sure that you have a light by your bed that is easy to reach. Do not use any sheets or blankets that are too big for your bed. They should not hang down onto the floor. Have a firm chair that has side arms. You can use this for support while you get dressed. Do not have throw rugs and other things on the floor that can make you trip. What can I do in the kitchen? Clean up any spills right away. Avoid walking on wet floors. Keep items that you use a lot in easy-to-reach places. If you need to reach something above you, use a strong step stool that has a grab bar. Keep electrical cords out of the way. Do not use floor polish or wax that makes floors slippery. If you must use wax, use non-skid floor wax. Do not have throw rugs and other things on the floor that can make you trip. What can  I do with my stairs? Do not leave any items on the stairs. Make sure that there are handrails on both sides of the stairs and use them. Fix handrails that are broken or loose. Make sure that handrails are as long as the stairways. Check any carpeting to make sure that it is firmly attached to the stairs. Fix any carpet that is loose or worn. Avoid having throw rugs at the top or bottom of the stairs. If you do have throw rugs, attach them to the floor with carpet tape. Make sure that you have a light switch at the top of the stairs and the bottom of the stairs. If you do not have them, ask someone to add them for you. What else can I do to help prevent falls? Wear shoes that: Do not have high heels. Have rubber bottoms. Are comfortable and fit you well. Are closed at the toe. Do not wear sandals. If you use a stepladder: Make sure that it is fully opened. Do not climb a closed stepladder. Make  sure that both sides of the stepladder are locked into place. Ask someone to hold it for you, if possible. Clearly mark and make sure that you can see: Any grab bars or handrails. First and last steps. Where the edge of each step is. Use tools that help you move around (mobility aids) if they are needed. These include: Canes. Walkers. Scooters. Crutches. Turn on the lights when you go into a dark area. Replace any light bulbs as soon as they burn out. Set up your furniture so you have a clear path. Avoid moving your furniture around. If any of your floors are uneven, fix them. If there are any pets around you, be aware of where they are. Review your medicines with your doctor. Some medicines can make you feel dizzy. This can increase your chance of falling. Ask your doctor what other things that you can do to help prevent falls. This information is not intended to replace advice given to you by your health care provider. Make sure you discuss any questions you have with your health care provider. Document Released: 06/17/2009 Document Revised: 01/27/2016 Document Reviewed: 09/25/2014 Elsevier Interactive Patient Education  2017 [employer].

## 2022-12-25 NOTE — Addendum Note (Signed)
Addended by: [name] on: 12/25/2022 02:44 PM   Modules accepted: Level of Service

## 2023-01-06 DIAGNOSIS — G8929 Other chronic pain: Secondary | ICD-10-CM | POA: Diagnosis not present

## 2023-01-06 DIAGNOSIS — J45909 Unspecified asthma, uncomplicated: Secondary | ICD-10-CM | POA: Diagnosis not present

## 2023-01-06 DIAGNOSIS — N183 Chronic kidney disease, stage 3 unspecified: Secondary | ICD-10-CM | POA: Diagnosis not present

## 2023-01-06 DIAGNOSIS — Z974 Presence of external hearing-aid: Secondary | ICD-10-CM | POA: Diagnosis not present

## 2023-01-06 DIAGNOSIS — Z556 Problems related to health literacy: Secondary | ICD-10-CM | POA: Diagnosis not present

## 2023-01-06 DIAGNOSIS — M1990 Unspecified osteoarthritis, unspecified site: Secondary | ICD-10-CM | POA: Diagnosis not present

## 2023-01-06 DIAGNOSIS — D692 Other nonthrombocytopenic purpura: Secondary | ICD-10-CM | POA: Diagnosis not present

## 2023-01-06 DIAGNOSIS — Z96652 Presence of left artificial knee joint: Secondary | ICD-10-CM | POA: Diagnosis not present

## 2023-01-06 DIAGNOSIS — E559 Vitamin D deficiency, unspecified: Secondary | ICD-10-CM | POA: Diagnosis not present

## 2023-01-06 DIAGNOSIS — J3081 Allergic rhinitis due to animal (cat) (dog) hair and dander: Secondary | ICD-10-CM | POA: Diagnosis not present

## 2023-01-06 DIAGNOSIS — I129 Hypertensive chronic kidney disease with stage 1 through stage 4 chronic kidney disease, or unspecified chronic kidney disease: Secondary | ICD-10-CM | POA: Diagnosis not present

## 2023-01-06 DIAGNOSIS — E782 Mixed hyperlipidemia: Secondary | ICD-10-CM | POA: Diagnosis not present

## 2023-01-12 DIAGNOSIS — I129 Hypertensive chronic kidney disease with stage 1 through stage 4 chronic kidney disease, or unspecified chronic kidney disease: Secondary | ICD-10-CM | POA: Diagnosis not present

## 2023-01-12 DIAGNOSIS — M1990 Unspecified osteoarthritis, unspecified site: Secondary | ICD-10-CM | POA: Diagnosis not present

## 2023-01-12 DIAGNOSIS — J3081 Allergic rhinitis due to animal (cat) (dog) hair and dander: Secondary | ICD-10-CM | POA: Diagnosis not present

## 2023-01-12 DIAGNOSIS — E559 Vitamin D deficiency, unspecified: Secondary | ICD-10-CM | POA: Diagnosis not present

## 2023-01-12 DIAGNOSIS — G8929 Other chronic pain: Secondary | ICD-10-CM | POA: Diagnosis not present

## 2023-01-12 DIAGNOSIS — E782 Mixed hyperlipidemia: Secondary | ICD-10-CM | POA: Diagnosis not present

## 2023-01-12 DIAGNOSIS — N183 Chronic kidney disease, stage 3 unspecified: Secondary | ICD-10-CM | POA: Diagnosis not present

## 2023-01-12 DIAGNOSIS — D692 Other nonthrombocytopenic purpura: Secondary | ICD-10-CM | POA: Diagnosis not present

## 2023-01-12 DIAGNOSIS — J45909 Unspecified asthma, uncomplicated: Secondary | ICD-10-CM | POA: Diagnosis not present

## 2023-01-12 DIAGNOSIS — Z96652 Presence of left artificial knee joint: Secondary | ICD-10-CM | POA: Diagnosis not present

## 2023-01-12 DIAGNOSIS — Z556 Problems related to health literacy: Secondary | ICD-10-CM | POA: Diagnosis not present

## 2023-01-12 DIAGNOSIS — Z974 Presence of external hearing-aid: Secondary | ICD-10-CM | POA: Diagnosis not present

## 2023-01-18 DIAGNOSIS — I129 Hypertensive chronic kidney disease with stage 1 through stage 4 chronic kidney disease, or unspecified chronic kidney disease: Secondary | ICD-10-CM | POA: Diagnosis not present

## 2023-01-18 DIAGNOSIS — G8929 Other chronic pain: Secondary | ICD-10-CM | POA: Diagnosis not present

## 2023-01-18 DIAGNOSIS — M1990 Unspecified osteoarthritis, unspecified site: Secondary | ICD-10-CM | POA: Diagnosis not present

## 2023-01-18 DIAGNOSIS — E782 Mixed hyperlipidemia: Secondary | ICD-10-CM | POA: Diagnosis not present

## 2023-01-18 DIAGNOSIS — D692 Other nonthrombocytopenic purpura: Secondary | ICD-10-CM | POA: Diagnosis not present

## 2023-01-18 DIAGNOSIS — Z96652 Presence of left artificial knee joint: Secondary | ICD-10-CM | POA: Diagnosis not present

## 2023-01-18 DIAGNOSIS — Z974 Presence of external hearing-aid: Secondary | ICD-10-CM | POA: Diagnosis not present

## 2023-01-18 DIAGNOSIS — Z556 Problems related to health literacy: Secondary | ICD-10-CM | POA: Diagnosis not present

## 2023-01-18 DIAGNOSIS — E559 Vitamin D deficiency, unspecified: Secondary | ICD-10-CM | POA: Diagnosis not present

## 2023-01-18 DIAGNOSIS — J45909 Unspecified asthma, uncomplicated: Secondary | ICD-10-CM | POA: Diagnosis not present

## 2023-01-18 DIAGNOSIS — J3081 Allergic rhinitis due to animal (cat) (dog) hair and dander: Secondary | ICD-10-CM | POA: Diagnosis not present

## 2023-01-18 DIAGNOSIS — N183 Chronic kidney disease, stage 3 unspecified: Secondary | ICD-10-CM | POA: Diagnosis not present

## 2023-01-25 DIAGNOSIS — Z974 Presence of external hearing-aid: Secondary | ICD-10-CM | POA: Diagnosis not present

## 2023-01-25 DIAGNOSIS — Z556 Problems related to health literacy: Secondary | ICD-10-CM | POA: Diagnosis not present

## 2023-01-25 DIAGNOSIS — N183 Chronic kidney disease, stage 3 unspecified: Secondary | ICD-10-CM | POA: Diagnosis not present

## 2023-01-25 DIAGNOSIS — M1990 Unspecified osteoarthritis, unspecified site: Secondary | ICD-10-CM | POA: Diagnosis not present

## 2023-01-25 DIAGNOSIS — E782 Mixed hyperlipidemia: Secondary | ICD-10-CM | POA: Diagnosis not present

## 2023-01-25 DIAGNOSIS — J45909 Unspecified asthma, uncomplicated: Secondary | ICD-10-CM | POA: Diagnosis not present

## 2023-01-25 DIAGNOSIS — D692 Other nonthrombocytopenic purpura: Secondary | ICD-10-CM | POA: Diagnosis not present

## 2023-01-25 DIAGNOSIS — Z96652 Presence of left artificial knee joint: Secondary | ICD-10-CM | POA: Diagnosis not present

## 2023-01-25 DIAGNOSIS — J3081 Allergic rhinitis due to animal (cat) (dog) hair and dander: Secondary | ICD-10-CM | POA: Diagnosis not present

## 2023-01-25 DIAGNOSIS — G8929 Other chronic pain: Secondary | ICD-10-CM | POA: Diagnosis not present

## 2023-01-25 DIAGNOSIS — E559 Vitamin D deficiency, unspecified: Secondary | ICD-10-CM | POA: Diagnosis not present

## 2023-01-25 DIAGNOSIS — I129 Hypertensive chronic kidney disease with stage 1 through stage 4 chronic kidney disease, or unspecified chronic kidney disease: Secondary | ICD-10-CM | POA: Diagnosis not present

## 2023-02-01 DIAGNOSIS — Z96652 Presence of left artificial knee joint: Secondary | ICD-10-CM | POA: Diagnosis not present

## 2023-02-01 DIAGNOSIS — G8929 Other chronic pain: Secondary | ICD-10-CM | POA: Diagnosis not present

## 2023-02-01 DIAGNOSIS — J45909 Unspecified asthma, uncomplicated: Secondary | ICD-10-CM | POA: Diagnosis not present

## 2023-02-01 DIAGNOSIS — E782 Mixed hyperlipidemia: Secondary | ICD-10-CM | POA: Diagnosis not present

## 2023-02-01 DIAGNOSIS — M1990 Unspecified osteoarthritis, unspecified site: Secondary | ICD-10-CM | POA: Diagnosis not present

## 2023-02-01 DIAGNOSIS — J3081 Allergic rhinitis due to animal (cat) (dog) hair and dander: Secondary | ICD-10-CM | POA: Diagnosis not present

## 2023-02-01 DIAGNOSIS — Z974 Presence of external hearing-aid: Secondary | ICD-10-CM | POA: Diagnosis not present

## 2023-02-01 DIAGNOSIS — E559 Vitamin D deficiency, unspecified: Secondary | ICD-10-CM | POA: Diagnosis not present

## 2023-02-01 DIAGNOSIS — Z556 Problems related to health literacy: Secondary | ICD-10-CM | POA: Diagnosis not present

## 2023-02-01 DIAGNOSIS — I129 Hypertensive chronic kidney disease with stage 1 through stage 4 chronic kidney disease, or unspecified chronic kidney disease: Secondary | ICD-10-CM | POA: Diagnosis not present

## 2023-02-01 DIAGNOSIS — D692 Other nonthrombocytopenic purpura: Secondary | ICD-10-CM | POA: Diagnosis not present

## 2023-02-01 DIAGNOSIS — N183 Chronic kidney disease, stage 3 unspecified: Secondary | ICD-10-CM | POA: Diagnosis not present

## 2023-02-02 DIAGNOSIS — I129 Hypertensive chronic kidney disease with stage 1 through stage 4 chronic kidney disease, or unspecified chronic kidney disease: Secondary | ICD-10-CM | POA: Diagnosis not present

## 2023-02-02 DIAGNOSIS — E559 Vitamin D deficiency, unspecified: Secondary | ICD-10-CM | POA: Diagnosis not present

## 2023-02-02 DIAGNOSIS — D692 Other nonthrombocytopenic purpura: Secondary | ICD-10-CM | POA: Diagnosis not present

## 2023-02-02 DIAGNOSIS — Z974 Presence of external hearing-aid: Secondary | ICD-10-CM | POA: Diagnosis not present

## 2023-02-02 DIAGNOSIS — Z556 Problems related to health literacy: Secondary | ICD-10-CM | POA: Diagnosis not present

## 2023-02-02 DIAGNOSIS — M1990 Unspecified osteoarthritis, unspecified site: Secondary | ICD-10-CM | POA: Diagnosis not present

## 2023-02-02 DIAGNOSIS — N183 Chronic kidney disease, stage 3 unspecified: Secondary | ICD-10-CM | POA: Diagnosis not present

## 2023-02-02 DIAGNOSIS — J3081 Allergic rhinitis due to animal (cat) (dog) hair and dander: Secondary | ICD-10-CM | POA: Diagnosis not present

## 2023-02-02 DIAGNOSIS — Z96652 Presence of left artificial knee joint: Secondary | ICD-10-CM | POA: Diagnosis not present

## 2023-02-02 DIAGNOSIS — J45909 Unspecified asthma, uncomplicated: Secondary | ICD-10-CM | POA: Diagnosis not present

## 2023-02-02 DIAGNOSIS — G8929 Other chronic pain: Secondary | ICD-10-CM | POA: Diagnosis not present

## 2023-02-02 DIAGNOSIS — E782 Mixed hyperlipidemia: Secondary | ICD-10-CM | POA: Diagnosis not present

## 2023-02-07 DIAGNOSIS — E782 Mixed hyperlipidemia: Secondary | ICD-10-CM | POA: Diagnosis not present

## 2023-02-07 DIAGNOSIS — N183 Chronic kidney disease, stage 3 unspecified: Secondary | ICD-10-CM | POA: Diagnosis not present

## 2023-02-07 DIAGNOSIS — Z974 Presence of external hearing-aid: Secondary | ICD-10-CM | POA: Diagnosis not present

## 2023-02-07 DIAGNOSIS — J45909 Unspecified asthma, uncomplicated: Secondary | ICD-10-CM | POA: Diagnosis not present

## 2023-02-07 DIAGNOSIS — G8929 Other chronic pain: Secondary | ICD-10-CM | POA: Diagnosis not present

## 2023-02-07 DIAGNOSIS — Z556 Problems related to health literacy: Secondary | ICD-10-CM | POA: Diagnosis not present

## 2023-02-07 DIAGNOSIS — M1990 Unspecified osteoarthritis, unspecified site: Secondary | ICD-10-CM | POA: Diagnosis not present

## 2023-02-07 DIAGNOSIS — I129 Hypertensive chronic kidney disease with stage 1 through stage 4 chronic kidney disease, or unspecified chronic kidney disease: Secondary | ICD-10-CM | POA: Diagnosis not present

## 2023-02-07 DIAGNOSIS — Z96652 Presence of left artificial knee joint: Secondary | ICD-10-CM | POA: Diagnosis not present

## 2023-02-07 DIAGNOSIS — E559 Vitamin D deficiency, unspecified: Secondary | ICD-10-CM | POA: Diagnosis not present

## 2023-02-07 DIAGNOSIS — J3081 Allergic rhinitis due to animal (cat) (dog) hair and dander: Secondary | ICD-10-CM | POA: Diagnosis not present

## 2023-02-07 DIAGNOSIS — D692 Other nonthrombocytopenic purpura: Secondary | ICD-10-CM | POA: Diagnosis not present

## 2023-02-08 ENCOUNTER — Telehealth: Payer: Self-pay | Admitting: Family Medicine

## 2023-02-08 DIAGNOSIS — N183 Chronic kidney disease, stage 3 unspecified: Secondary | ICD-10-CM | POA: Diagnosis not present

## 2023-02-08 DIAGNOSIS — D692 Other nonthrombocytopenic purpura: Secondary | ICD-10-CM | POA: Diagnosis not present

## 2023-02-08 DIAGNOSIS — Z96652 Presence of left artificial knee joint: Secondary | ICD-10-CM | POA: Diagnosis not present

## 2023-02-08 DIAGNOSIS — I129 Hypertensive chronic kidney disease with stage 1 through stage 4 chronic kidney disease, or unspecified chronic kidney disease: Secondary | ICD-10-CM | POA: Diagnosis not present

## 2023-02-08 DIAGNOSIS — Z556 Problems related to health literacy: Secondary | ICD-10-CM | POA: Diagnosis not present

## 2023-02-08 DIAGNOSIS — M1990 Unspecified osteoarthritis, unspecified site: Secondary | ICD-10-CM | POA: Diagnosis not present

## 2023-02-08 DIAGNOSIS — G8929 Other chronic pain: Secondary | ICD-10-CM | POA: Diagnosis not present

## 2023-02-08 DIAGNOSIS — E782 Mixed hyperlipidemia: Secondary | ICD-10-CM | POA: Diagnosis not present

## 2023-02-08 DIAGNOSIS — J3081 Allergic rhinitis due to animal (cat) (dog) hair and dander: Secondary | ICD-10-CM | POA: Diagnosis not present

## 2023-02-08 DIAGNOSIS — J45909 Unspecified asthma, uncomplicated: Secondary | ICD-10-CM | POA: Diagnosis not present

## 2023-02-08 DIAGNOSIS — Z974 Presence of external hearing-aid: Secondary | ICD-10-CM | POA: Diagnosis not present

## 2023-02-08 DIAGNOSIS — E559 Vitamin D deficiency, unspecified: Secondary | ICD-10-CM | POA: Diagnosis not present

## 2023-02-08 NOTE — Telephone Encounter (Signed)
Copied from CRM [id number]. Topic: Quick Communication - Home Health Verbal Orders >> Feb 08, 2023  2:16 PM Clide Dales wrote: Caller/Agency: Stephanie/[facility] Callback Number: [phone] Requesting OT/PT/Skilled Nursing/Social Work/Speech Therapy: OT Frequency: 1w1, 2w1, 1w1

## 2023-02-09 NOTE — Telephone Encounter (Signed)
Called and LVM giving verbal orders per Jolene.  ?

## 2023-02-12 ENCOUNTER — Other Ambulatory Visit: Payer: Medicare Other

## 2023-02-16 DIAGNOSIS — J3081 Allergic rhinitis due to animal (cat) (dog) hair and dander: Secondary | ICD-10-CM | POA: Diagnosis not present

## 2023-02-16 DIAGNOSIS — M1990 Unspecified osteoarthritis, unspecified site: Secondary | ICD-10-CM | POA: Diagnosis not present

## 2023-02-16 DIAGNOSIS — I129 Hypertensive chronic kidney disease with stage 1 through stage 4 chronic kidney disease, or unspecified chronic kidney disease: Secondary | ICD-10-CM | POA: Diagnosis not present

## 2023-02-16 DIAGNOSIS — E782 Mixed hyperlipidemia: Secondary | ICD-10-CM | POA: Diagnosis not present

## 2023-02-16 DIAGNOSIS — Z96652 Presence of left artificial knee joint: Secondary | ICD-10-CM | POA: Diagnosis not present

## 2023-02-16 DIAGNOSIS — E559 Vitamin D deficiency, unspecified: Secondary | ICD-10-CM | POA: Diagnosis not present

## 2023-02-16 DIAGNOSIS — D692 Other nonthrombocytopenic purpura: Secondary | ICD-10-CM | POA: Diagnosis not present

## 2023-02-16 DIAGNOSIS — N183 Chronic kidney disease, stage 3 unspecified: Secondary | ICD-10-CM | POA: Diagnosis not present

## 2023-02-16 DIAGNOSIS — Z556 Problems related to health literacy: Secondary | ICD-10-CM | POA: Diagnosis not present

## 2023-02-16 DIAGNOSIS — Z974 Presence of external hearing-aid: Secondary | ICD-10-CM | POA: Diagnosis not present

## 2023-02-16 DIAGNOSIS — J45909 Unspecified asthma, uncomplicated: Secondary | ICD-10-CM | POA: Diagnosis not present

## 2023-02-16 DIAGNOSIS — G8929 Other chronic pain: Secondary | ICD-10-CM | POA: Diagnosis not present

## 2023-02-20 DIAGNOSIS — D692 Other nonthrombocytopenic purpura: Secondary | ICD-10-CM | POA: Diagnosis not present

## 2023-02-20 DIAGNOSIS — J45909 Unspecified asthma, uncomplicated: Secondary | ICD-10-CM | POA: Diagnosis not present

## 2023-02-20 DIAGNOSIS — N183 Chronic kidney disease, stage 3 unspecified: Secondary | ICD-10-CM | POA: Diagnosis not present

## 2023-02-20 DIAGNOSIS — M1990 Unspecified osteoarthritis, unspecified site: Secondary | ICD-10-CM | POA: Diagnosis not present

## 2023-02-20 DIAGNOSIS — Z974 Presence of external hearing-aid: Secondary | ICD-10-CM | POA: Diagnosis not present

## 2023-02-20 DIAGNOSIS — J3081 Allergic rhinitis due to animal (cat) (dog) hair and dander: Secondary | ICD-10-CM | POA: Diagnosis not present

## 2023-02-20 DIAGNOSIS — G8929 Other chronic pain: Secondary | ICD-10-CM | POA: Diagnosis not present

## 2023-02-20 DIAGNOSIS — Z96652 Presence of left artificial knee joint: Secondary | ICD-10-CM | POA: Diagnosis not present

## 2023-02-20 DIAGNOSIS — E782 Mixed hyperlipidemia: Secondary | ICD-10-CM | POA: Diagnosis not present

## 2023-02-20 DIAGNOSIS — I129 Hypertensive chronic kidney disease with stage 1 through stage 4 chronic kidney disease, or unspecified chronic kidney disease: Secondary | ICD-10-CM | POA: Diagnosis not present

## 2023-02-20 DIAGNOSIS — Z556 Problems related to health literacy: Secondary | ICD-10-CM | POA: Diagnosis not present

## 2023-02-20 DIAGNOSIS — E559 Vitamin D deficiency, unspecified: Secondary | ICD-10-CM | POA: Diagnosis not present

## 2023-02-21 DIAGNOSIS — E559 Vitamin D deficiency, unspecified: Secondary | ICD-10-CM | POA: Diagnosis not present

## 2023-02-21 DIAGNOSIS — Z974 Presence of external hearing-aid: Secondary | ICD-10-CM | POA: Diagnosis not present

## 2023-02-21 DIAGNOSIS — Z96652 Presence of left artificial knee joint: Secondary | ICD-10-CM | POA: Diagnosis not present

## 2023-02-21 DIAGNOSIS — I129 Hypertensive chronic kidney disease with stage 1 through stage 4 chronic kidney disease, or unspecified chronic kidney disease: Secondary | ICD-10-CM | POA: Diagnosis not present

## 2023-02-21 DIAGNOSIS — Z556 Problems related to health literacy: Secondary | ICD-10-CM | POA: Diagnosis not present

## 2023-02-21 DIAGNOSIS — J3081 Allergic rhinitis due to animal (cat) (dog) hair and dander: Secondary | ICD-10-CM | POA: Diagnosis not present

## 2023-02-21 DIAGNOSIS — G8929 Other chronic pain: Secondary | ICD-10-CM | POA: Diagnosis not present

## 2023-02-21 DIAGNOSIS — J45909 Unspecified asthma, uncomplicated: Secondary | ICD-10-CM | POA: Diagnosis not present

## 2023-02-21 DIAGNOSIS — E782 Mixed hyperlipidemia: Secondary | ICD-10-CM | POA: Diagnosis not present

## 2023-02-21 DIAGNOSIS — D692 Other nonthrombocytopenic purpura: Secondary | ICD-10-CM | POA: Diagnosis not present

## 2023-02-21 DIAGNOSIS — M1990 Unspecified osteoarthritis, unspecified site: Secondary | ICD-10-CM | POA: Diagnosis not present

## 2023-02-21 DIAGNOSIS — N183 Chronic kidney disease, stage 3 unspecified: Secondary | ICD-10-CM | POA: Diagnosis not present

## 2023-02-22 DIAGNOSIS — Z974 Presence of external hearing-aid: Secondary | ICD-10-CM | POA: Diagnosis not present

## 2023-02-22 DIAGNOSIS — N183 Chronic kidney disease, stage 3 unspecified: Secondary | ICD-10-CM | POA: Diagnosis not present

## 2023-02-22 DIAGNOSIS — M1990 Unspecified osteoarthritis, unspecified site: Secondary | ICD-10-CM | POA: Diagnosis not present

## 2023-02-22 DIAGNOSIS — E782 Mixed hyperlipidemia: Secondary | ICD-10-CM | POA: Diagnosis not present

## 2023-02-22 DIAGNOSIS — I129 Hypertensive chronic kidney disease with stage 1 through stage 4 chronic kidney disease, or unspecified chronic kidney disease: Secondary | ICD-10-CM | POA: Diagnosis not present

## 2023-02-22 DIAGNOSIS — G8929 Other chronic pain: Secondary | ICD-10-CM | POA: Diagnosis not present

## 2023-02-22 DIAGNOSIS — Z556 Problems related to health literacy: Secondary | ICD-10-CM | POA: Diagnosis not present

## 2023-02-22 DIAGNOSIS — D692 Other nonthrombocytopenic purpura: Secondary | ICD-10-CM | POA: Diagnosis not present

## 2023-02-22 DIAGNOSIS — Z96652 Presence of left artificial knee joint: Secondary | ICD-10-CM | POA: Diagnosis not present

## 2023-02-22 DIAGNOSIS — E559 Vitamin D deficiency, unspecified: Secondary | ICD-10-CM | POA: Diagnosis not present

## 2023-02-22 DIAGNOSIS — J45909 Unspecified asthma, uncomplicated: Secondary | ICD-10-CM | POA: Diagnosis not present

## 2023-02-22 DIAGNOSIS — J3081 Allergic rhinitis due to animal (cat) (dog) hair and dander: Secondary | ICD-10-CM | POA: Diagnosis not present

## 2023-04-03 ENCOUNTER — Other Ambulatory Visit: Payer: Self-pay

## 2023-04-06 ENCOUNTER — Ambulatory Visit: Payer: Medicare HMO | Admitting: Family Medicine

## 2023-04-30 ENCOUNTER — Ambulatory Visit: Payer: Medicare HMO | Admitting: Family Medicine

## 2023-05-16 ENCOUNTER — Other Ambulatory Visit: Payer: Self-pay

## 2023-05-25 ENCOUNTER — Ambulatory Visit (INDEPENDENT_AMBULATORY_CARE_PROVIDER_SITE_OTHER): Payer: Medicare HMO | Admitting: Family Medicine

## 2023-05-25 ENCOUNTER — Encounter: Payer: Self-pay | Admitting: Family Medicine

## 2023-05-25 ENCOUNTER — Telehealth: Payer: Self-pay | Admitting: *Deleted

## 2023-05-25 VITALS — BP 134/84 | HR 66 | Ht 62.0 in | Wt 155.0 lb

## 2023-05-25 DIAGNOSIS — N3001 Acute cystitis with hematuria: Secondary | ICD-10-CM

## 2023-05-25 DIAGNOSIS — R300 Dysuria: Secondary | ICD-10-CM

## 2023-05-25 DIAGNOSIS — Z23 Encounter for immunization: Secondary | ICD-10-CM | POA: Diagnosis not present

## 2023-05-25 DIAGNOSIS — Z5982 Transportation insecurity: Secondary | ICD-10-CM

## 2023-05-25 DIAGNOSIS — R8281 Pyuria: Secondary | ICD-10-CM | POA: Diagnosis not present

## 2023-05-25 LAB — URINALYSIS, ROUTINE W REFLEX MICROSCOPIC
Bilirubin, UA: NEGATIVE
Glucose, UA: NEGATIVE
Ketones, UA: NEGATIVE
Nitrite, UA: NEGATIVE
Protein,UA: NEGATIVE
Specific Gravity, UA: 1.015 (ref 1.005–1.030)
Urobilinogen, Ur: 0.2 mg/dL (ref 0.2–1.0)
pH, UA: 6 (ref 5.0–7.5)

## 2023-05-25 LAB — MICROSCOPIC EXAMINATION: Bacteria, UA: NONE SEEN

## 2023-05-25 MED ORDER — NITROFURANTOIN MONOHYD MACRO 100 MG PO CAPS: 100.0000 mg | ORAL_CAPSULE | Freq: Two times a day (BID) | ORAL | 0 refills | Status: DC

## 2023-05-25 NOTE — Progress Notes (Signed)
BP 134/84   Pulse 66   Ht 5\' 2"  (1.575 m)   Wt 155 lb (70.3 kg)   LMP  (LMP Unknown)   SpO2 92%   BMI 28.35 kg/m    Subjective:    Patient ID: Renee Bridges, female    DOB: Oct 11, 1945, 77 y.o.   MRN: [id number]  HPI: Renee Bridges is a 77 y.o. female  Chief Complaint  Patient presents with   Urinary Tract Infection    Patient says she thinks she may have a Kidney Infection. Patient says she has been having diarrhea, vomiting, burning with urination. Patient says first noticed symptoms day before yesterday. Patient says when she went to the restroom yesterday, she says it really really hurt.    URINARY SYMPTOMS Duration 2 days Dysuria: yes Urinary frequency: yes Urgency: yes Small volume voids: no Symptom severity: moderate Urinary incontinence: yes Foul odor: yes Hematuria: no Abdominal pain: no Back pain: no Suprapubic pain/pressure: no Flank pain: no Fever:  no Vomiting: no Relief with cranberry juice: no Relief with pyridium: no Status: worse Previous urinary tract infection: yes Recurrent urinary tract infection: no Sexual activity: No sexually active History of sexually transmitted disease: no Vaginal discharge: no Treatments attempted: increasing fluids   Relevant past medical, surgical, family and social history reviewed and updated as indicated. Interim medical history since our last visit reviewed. Allergies and medications reviewed and updated.  Review of Systems  Constitutional: Negative.   Respiratory: Negative.    Cardiovascular: Negative.   Gastrointestinal:  Positive for diarrhea (about a week ago). Negative for abdominal distention, abdominal pain, anal bleeding, blood in stool, constipation, nausea, rectal pain and vomiting.  Genitourinary:  Positive for dysuria, frequency, hematuria and urgency. Negative for decreased urine volume, difficulty urinating, dyspareunia, enuresis, flank pain, genital sores, menstrual problem, pelvic pain, vaginal  bleeding, vaginal discharge and vaginal pain.  Musculoskeletal: Negative.   Psychiatric/Behavioral: Negative.      Per HPI unless specifically indicated above     Objective:    BP 134/84   Pulse 66   Ht 5\' 2"  (1.575 m)   Wt 155 lb (70.3 kg)   LMP  (LMP Unknown)   SpO2 92%   BMI 28.35 kg/m   Wt Readings from Last 3 Encounters:  05/25/23 155 lb (70.3 kg)  12/25/22 159 lb (72.1 kg)  12/15/22 159 lb 9.6 oz (72.4 kg)    Physical Exam Vitals and nursing note reviewed.  Constitutional:      General: She is not in acute distress.    Appearance: Normal appearance. She is not ill-appearing, toxic-appearing or diaphoretic.  HENT:     Head: Normocephalic and atraumatic.     Right Ear: External ear normal.     Left Ear: External ear normal.     Nose: Nose normal.     Mouth/Throat:     Mouth: Mucous membranes are moist.     Pharynx: Oropharynx is clear.  Eyes:     General: No scleral icterus.       Right eye: No discharge.        Left eye: No discharge.     Extraocular Movements: Extraocular movements intact.     Conjunctiva/sclera: Conjunctivae normal.     Pupils: Pupils are equal, round, and reactive to light.  Cardiovascular:     Rate and Rhythm: Normal rate and regular rhythm.     Pulses: Normal pulses.     Heart sounds: Normal heart sounds. No murmur heard.  No friction rub. No gallop.  Pulmonary:     Effort: Pulmonary effort is normal. No respiratory distress.     Breath sounds: Normal breath sounds. No stridor. No wheezing, rhonchi or rales.  Chest:     Chest wall: No tenderness.  Musculoskeletal:        General: Normal range of motion.     Cervical back: Normal range of motion and neck supple.  Skin:    General: Skin is warm and dry.     Capillary Refill: Capillary refill takes less than 2 seconds.     Coloration: Skin is not jaundiced or pale.     Findings: No bruising, erythema, lesion or rash.  Neurological:     General: No focal deficit present.      Mental Status: She is alert and oriented to person, place, and time. Mental status is at baseline.  Psychiatric:        Mood and Affect: Mood normal.        Behavior: Behavior normal.        Thought Content: Thought content normal.        Judgment: Judgment normal.     Results for orders placed or performed in visit on 05/25/23  Microscopic Examination   Urine  Result Value Ref Range   WBC, UA 6-10 (A) 0 - 5 /hpf   RBC, Urine 3-10 (A) 0 - 2 /hpf   Epithelial Cells (non renal) 0-10 0 - 10 /hpf   Mucus, UA Present (A) Not Estab.   Bacteria, UA None seen None seen/Few  Urinalysis, Routine w reflex microscopic  Result Value Ref Range   Specific Gravity, UA 1.015 1.005 - 1.030   pH, UA 6.0 5.0 - 7.5   Color, UA Yellow Yellow   Appearance Ur Cloudy (A) Clear   Leukocytes,UA 1+ (A) Negative   Protein,UA Negative Negative/Trace   Glucose, UA Negative Negative   Ketones, UA Negative Negative   RBC, UA 2+ (A) Negative   Bilirubin, UA Negative Negative   Urobilinogen, Ur 0.2 0.2 - 1.0 mg/dL   Nitrite, UA Negative Negative   Microscopic Examination See below:       Assessment & Plan:   Problem List Items Addressed This Visit   None Visit Diagnoses     Acute cystitis with hematuria    -  Primary   Will treat with nitrofurantoin. Await culture. Call with any concerns.   Relevant Orders   Urine Culture   Dysuria       + leuks   Relevant Orders   Urinalysis, Routine w reflex microscopic (Completed)   Flu vaccine need       Flu shot given today.   Relevant Orders   Flu Vaccine Trivalent High Dose (Fluad) (Completed)   Problem with transportation       Referral to social work placed today   Relevant Orders   AMB Referral to [facility] Coordinaton ([facility] Patients)        Follow up plan: Return in about 4 weeks (around 06/22/2023).

## 2023-05-25 NOTE — Progress Notes (Signed)
Care Coordination   Note   05/25/2023 Name: Renee Bridges MRN: [id number] DOB: April 14, 1946  Renee Bridges is a 77 y.o. year old female who sees [name], DO for primary care. I reached out to [name] by phone today to offer care coordination services.  [name] was given information about Care Coordination services today including:   The Care Coordination services include support from the care team which includes your Nurse Coordinator, Clinical Social Worker, or Pharmacist.  The Care Coordination team is here to help remove barriers to the health concerns and goals most important to you. Care Coordination services are voluntary, and the patient may decline or stop services at any time by request to their care team member.   Care Coordination Consent Status: Patient agreed to services and verbal consent obtained.   Follow up plan:  Telephone appointment with care coordination team member scheduled for:  05/30/2023  Encounter Outcome:  Patient Scheduled from referral   Burman Nieves, [facility] Care Coordination Care Guide Direct Dial: [phone]

## 2023-05-28 LAB — URINE CULTURE

## 2023-05-28 IMAGING — CR DG SHOULDER 2+V*L*
1 series · 3 of 3 positions shown · non-contrast
Comparison: None.

CLINICAL DATA: Left shoulder pain, decreased mobility.

EXAM:
LEFT SHOULDER - 2+ VIEW

[Series 1: dg shoulder left · 0.14mm/px · 3 of 3 slices shown]
[im 1/3]
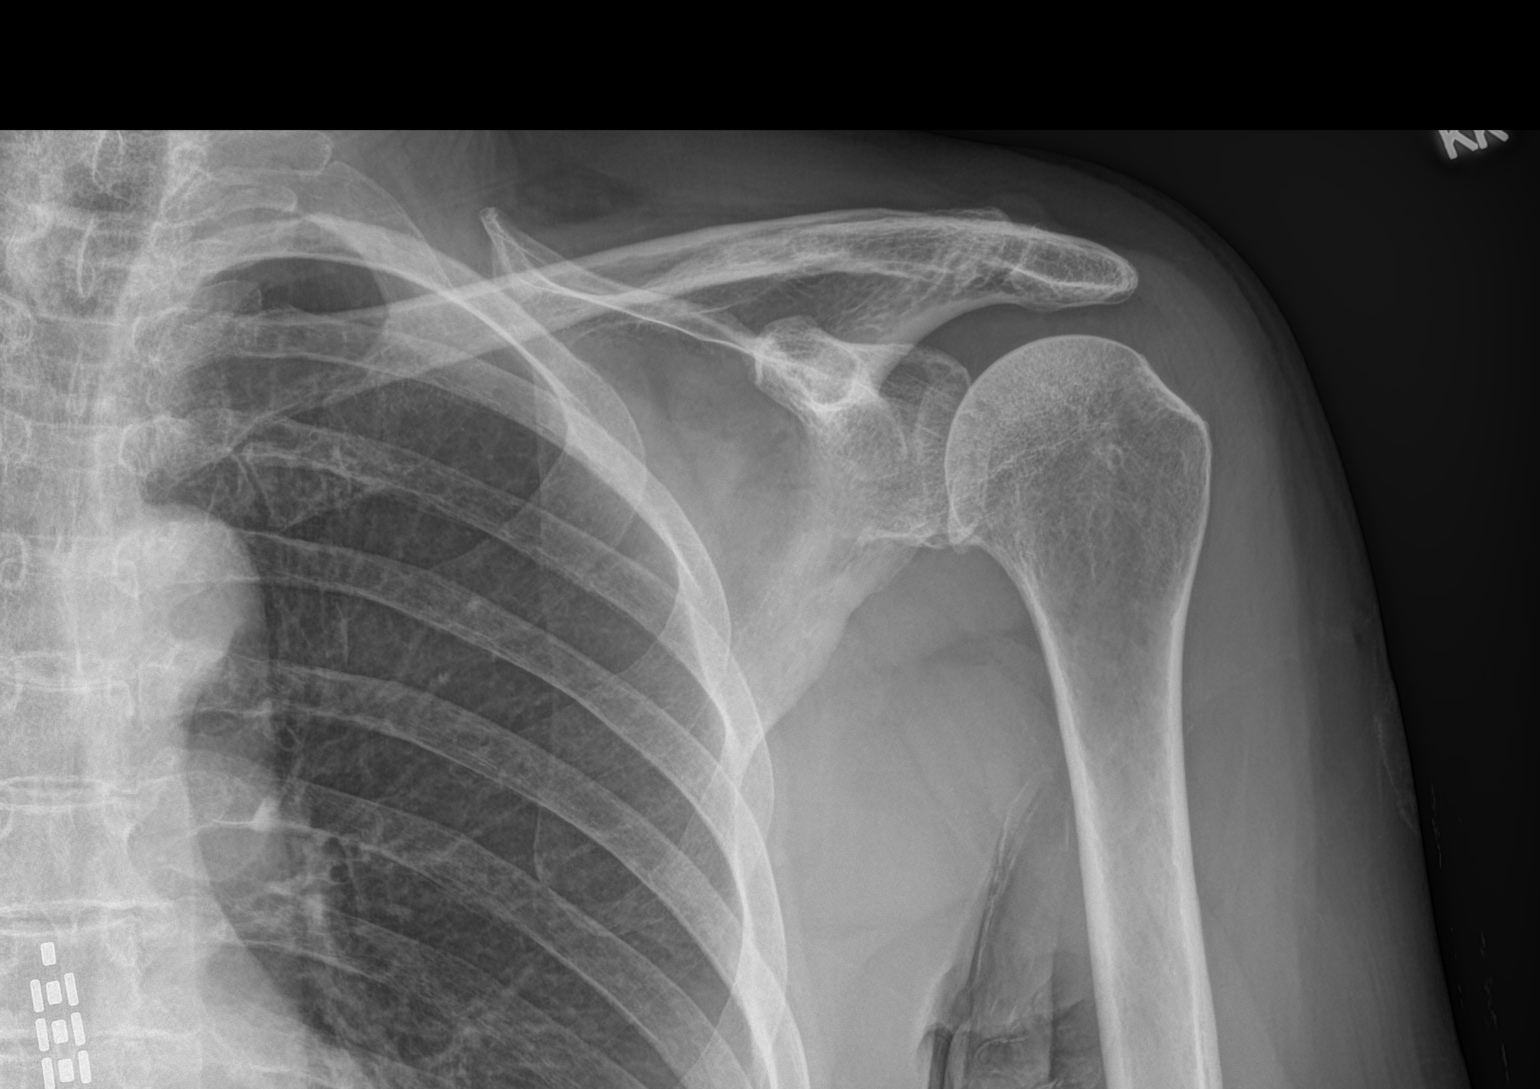
[im 2/3]
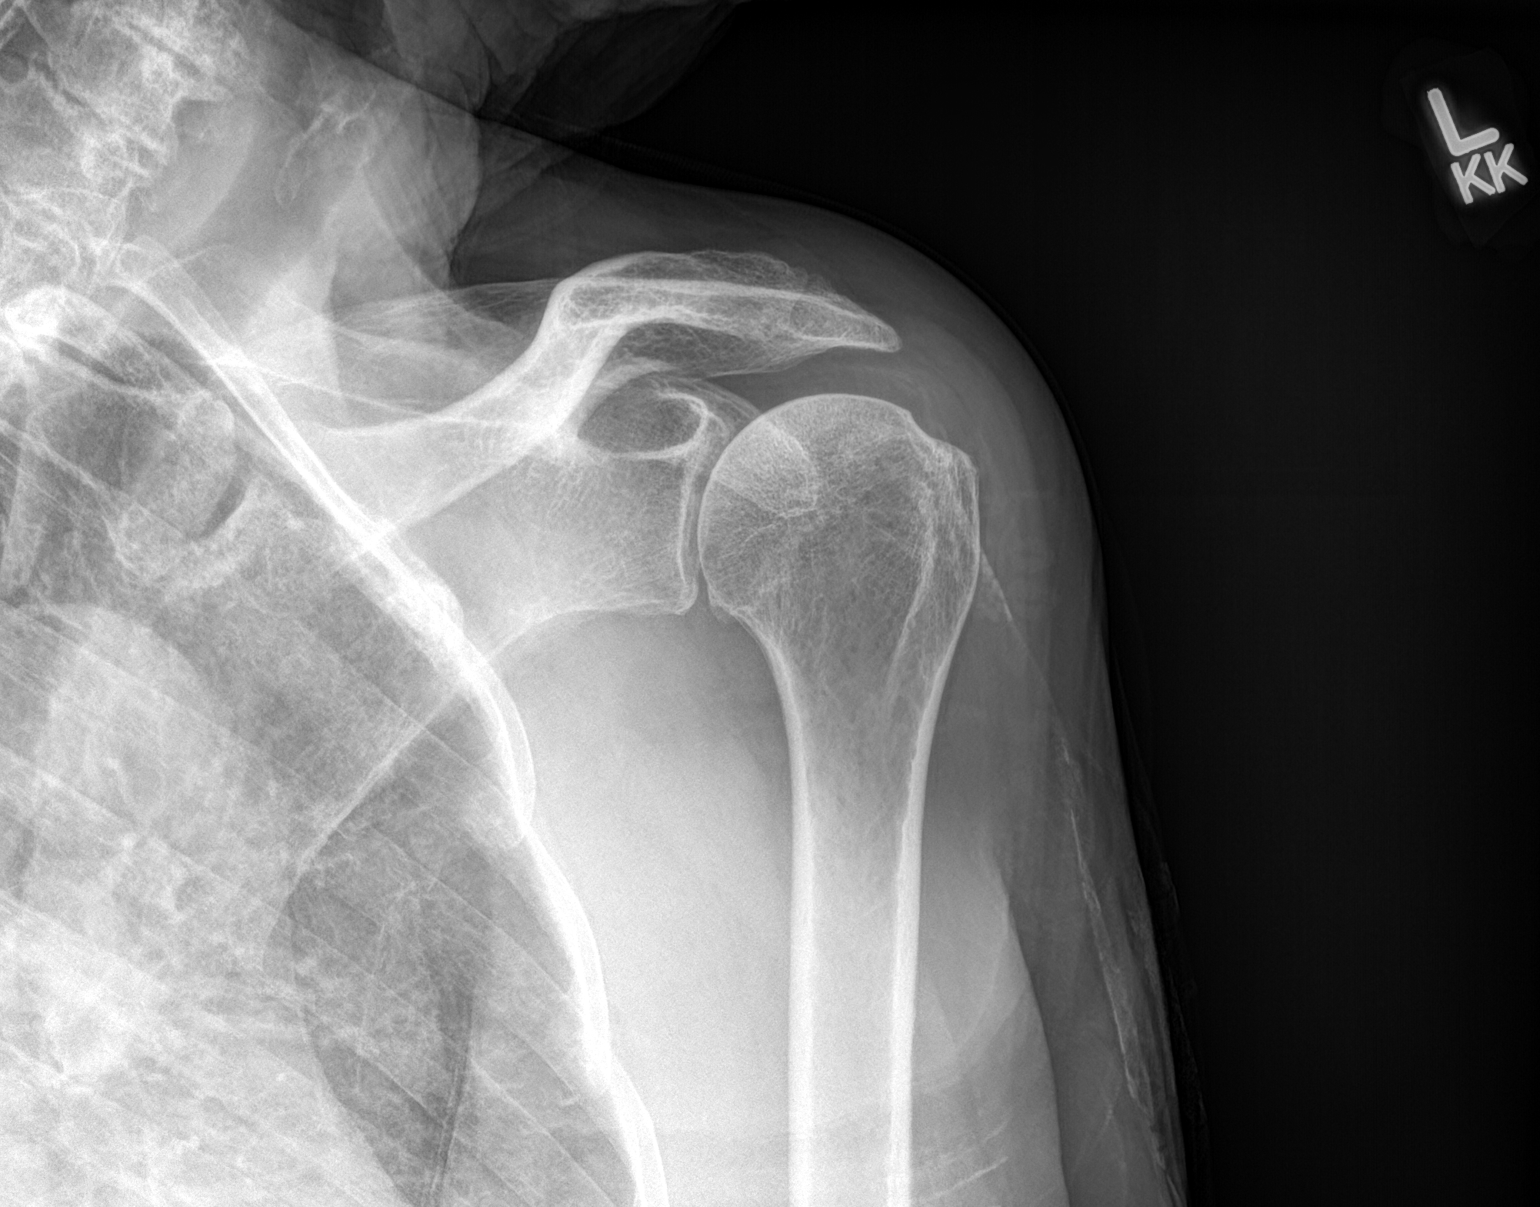
[im 3/3]
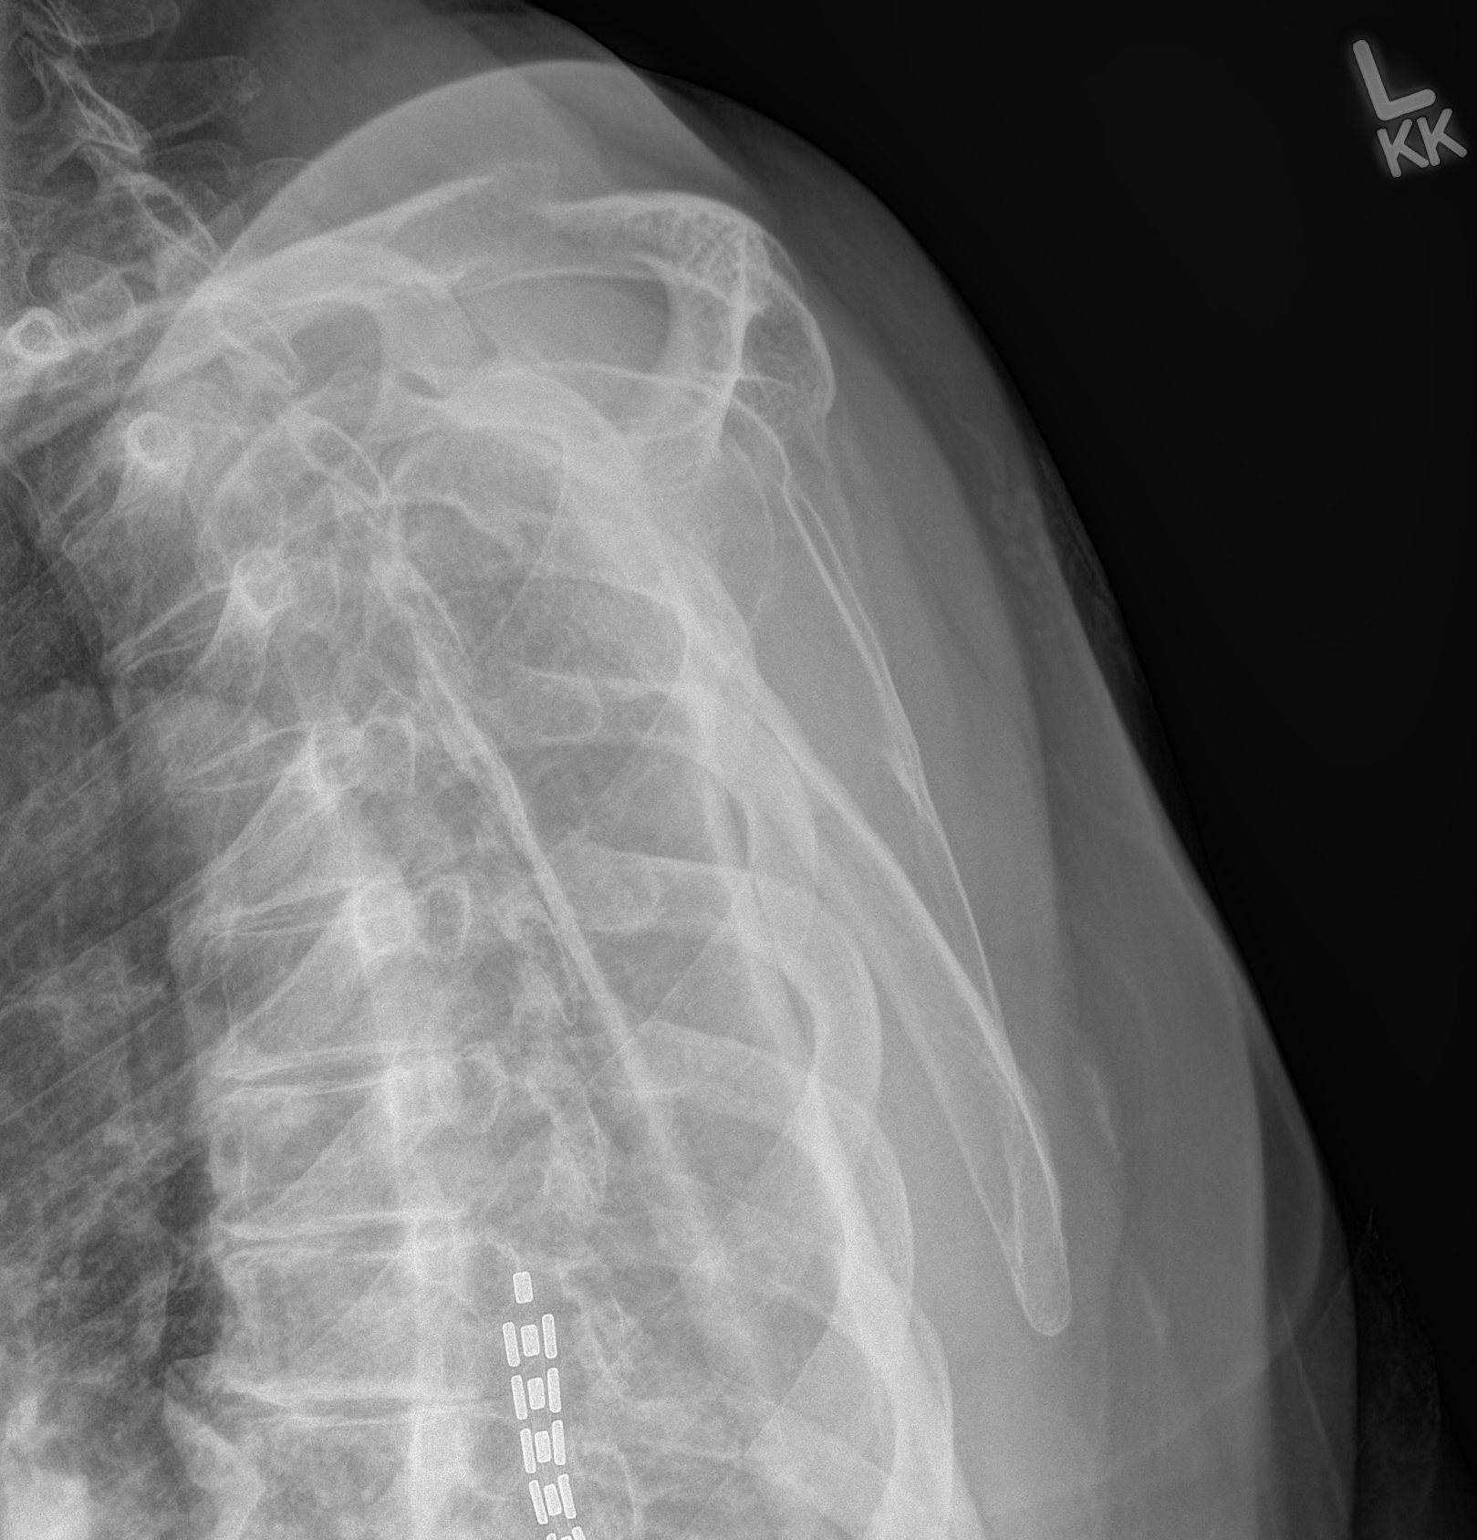

[3 of 3 positions shown; findings below may reference images not displayed]

FINDINGS: Early degenerative changes in the AC and glenohumeral joints with
spurring and joint space narrowing. No acute bony abnormality.
Specifically, no fracture, subluxation, or dislocation.
IMPRESSION: Early degenerative changes.  No acute bony abnormality.

## 2023-05-30 ENCOUNTER — Ambulatory Visit: Payer: Self-pay

## 2023-05-30 NOTE — Patient Outreach (Signed)
Care Coordination   05/30/2023 Name: Renee Bridges MRN: [id number] DOB: 1946/06/17   Care Coordination Outreach Attempts:  An unsuccessful telephone outreach was attempted for a scheduled appointment today.  Follow Up Plan:  Additional outreach attempts will be made to offer the patient care coordination information and services.   Encounter Outcome:  No Answer   Care Coordination Interventions:  No, not indicated    Lysle Morales, BSW Social Worker [phone]

## 2023-06-15 ENCOUNTER — Ambulatory Visit: Payer: Self-pay

## 2023-06-15 NOTE — Patient Outreach (Signed)
Care Coordination   06/15/2023 Name: Renee Bridges MRN: [id number] DOB: 1945/11/14   Care Coordination Outreach Attempts:  An unsuccessful telephone outreach was attempted for a scheduled appointment today.  Follow Up Plan:  Additional outreach attempts will be made to offer the patient care coordination information and services.   Encounter Outcome:  Patient Visit Completed   Care Coordination Interventions:  No, not indicated    Lysle Morales, BSW Social Worker [phone]

## 2023-06-19 ENCOUNTER — Telehealth: Payer: Self-pay | Admitting: *Deleted

## 2023-06-19 NOTE — Progress Notes (Unsigned)
Care Coordination Note  06/19/2023 Name: Renee Bridges MRN: [id number] DOB: Jul 02, 1946  Renee Bridges is a 77 y.o. year old female who is a primary care patient of [name], DO and is actively engaged with the care management team. I reached out to [name] by phone today to assist with re-scheduling an initial visit with the BSW  Follow up plan: Unsuccessful telephone outreach attempt made. A HIPAA compliant phone message was left for the patient providing contact information and requesting a return call.   Burman Nieves, CCMA Care Coordination Care Guide Direct Dial: [phone]

## 2023-06-20 NOTE — Progress Notes (Signed)
Care Coordination Note  06/20/2023 Name: [name] MRN: [id number] DOB: [birth year]  Renee Bridges is a 77 y.o. year old female who is a primary care patient of [name], DO and is actively engaged with the care management team. I reached out to [name] by phone today to assist with re-scheduling an initial visit with the BSW  Follow up plan: Telephone appointment with care management team member scheduled for: 06/29/2023  Burman Nieves, [facility] Care Coordination Care Guide Direct Dial: [phone]

## 2023-06-25 ENCOUNTER — Ambulatory Visit: Payer: Medicare HMO | Admitting: Family Medicine

## 2023-06-29 ENCOUNTER — Ambulatory Visit: Payer: Self-pay

## 2023-06-29 NOTE — Patient Outreach (Signed)
Care Coordination   Initial Visit Note   06/29/2023 Name: Renee Bridges MRN: [id number] DOB: 1946-03-20  Renee Bridges is a 77 y.o. year old female who sees [name], DO for primary care. I spoke with  [name] by phone today.  What matters to the patients health and wellness today?  Patient needs information on transportation.    Goals Addressed             This Visit's Progress    Transportation       Interventions Today    Flowsheet Row Most Recent Value  General Interventions   General Interventions Discussed/Reviewed General Interventions Discussed, General Interventions Reviewed  [Pt reports she does not like [employer] and changed back to [facility]. Pt will have transportation when the change is effective in January 2025.  Will need to look for transportation options until January. Pt is about to leave and request SW call again.]              SDOH assessments and interventions completed:  No     Care Coordination Interventions:  Yes, provided   Follow up plan: Follow up call scheduled for 07/02/23 at 2:15pm    Encounter Outcome:  Patient Visit Completed

## 2023-06-29 NOTE — Patient Instructions (Signed)
Visit Information  Thank you for taking time to visit with me today. Please don't hesitate to contact me if I can be of assistance to you.   Following are the goals we discussed today:     Our next appointment is by telephone on 07/02/23 at 2:15pm  Please call the care guide team at [phone] if you need to cancel or reschedule your appointment.   If you are experiencing a Mental Health or Behavioral Health Crisis or need someone to talk to, please call 911  Patient verbalizes understanding of instructions and care plan provided today and agrees to view in MyChart. Active MyChart status and patient understanding of how to access instructions and care plan via MyChart confirmed with patient.     Telephone follow up appointment with care management team member scheduled for:07/02/23 at 2:15pm.  Lysle Morales, BSW Social Worker  [phone]

## 2023-07-02 ENCOUNTER — Ambulatory Visit: Payer: Self-pay

## 2023-07-02 NOTE — Patient Outreach (Signed)
Care Coordination   07/02/2023 Name: BILLIE VITACCO MRN: [id number] DOB: [birth year]   Care Coordination Outreach Attempts:  An unsuccessful telephone outreach was attempted for a scheduled appointment today.  Follow Up Plan:  Additional outreach attempts will be made to offer the patient care coordination information and services.   Encounter Outcome:  No Answer   Care Coordination Interventions:  No, not indicated     Lysle Morales, BSW Social Worker [phone]

## 2023-07-10 ENCOUNTER — Ambulatory Visit (INDEPENDENT_AMBULATORY_CARE_PROVIDER_SITE_OTHER): Payer: Medicare HMO | Admitting: Family Medicine

## 2023-07-10 ENCOUNTER — Encounter: Payer: Self-pay | Admitting: Family Medicine

## 2023-07-10 VITALS — BP 136/84 | HR 70 | Ht 62.0 in | Wt 155.0 lb

## 2023-07-10 DIAGNOSIS — F339 Major depressive disorder, recurrent, unspecified: Secondary | ICD-10-CM

## 2023-07-10 DIAGNOSIS — F419 Anxiety disorder, unspecified: Secondary | ICD-10-CM | POA: Diagnosis not present

## 2023-07-10 DIAGNOSIS — I129 Hypertensive chronic kidney disease with stage 1 through stage 4 chronic kidney disease, or unspecified chronic kidney disease: Secondary | ICD-10-CM | POA: Diagnosis not present

## 2023-07-10 DIAGNOSIS — D692 Other nonthrombocytopenic purpura: Secondary | ICD-10-CM

## 2023-07-10 DIAGNOSIS — E782 Mixed hyperlipidemia: Secondary | ICD-10-CM | POA: Diagnosis not present

## 2023-07-10 DIAGNOSIS — E559 Vitamin D deficiency, unspecified: Secondary | ICD-10-CM | POA: Diagnosis not present

## 2023-07-10 DIAGNOSIS — N183 Chronic kidney disease, stage 3 unspecified: Secondary | ICD-10-CM | POA: Diagnosis not present

## 2023-07-10 DIAGNOSIS — Z0000 Encounter for general adult medical examination without abnormal findings: Secondary | ICD-10-CM

## 2023-07-10 LAB — MICROALBUMIN, URINE WAIVED
Creatinine, Urine Waived: 50 mg/dL (ref 10–300)
Microalb, Ur Waived: 10 mg/L (ref 0–19)

## 2023-07-10 MED ORDER — MUPIROCIN 2 % EX OINT: 1.0000 | TOPICAL_OINTMENT | Freq: Two times a day (BID) | CUTANEOUS | 0 refills | Status: DC

## 2023-07-10 NOTE — Assessment & Plan Note (Signed)
Doing well not on medicine. Continue to monitor. Call with any concerns.

## 2023-07-10 NOTE — Assessment & Plan Note (Signed)
Rechecking labs today. Await results.  

## 2023-07-10 NOTE — Progress Notes (Signed)
BP 136/84   Pulse 70   Ht 5\' 2"  (1.575 m)   Wt 155 lb (70.3 kg)   LMP  (LMP Unknown)   SpO2 98%   BMI 28.35 kg/m    Subjective:    Patient ID: Renee Bridges, female    DOB: 18-Nov-1945, 77 y.o.   MRN: [id number]  HPI: Renee Bridges is a 77 y.o. female presenting on 07/10/2023 for comprehensive medical examination. Current medical complaints include:  Was sick after last visit. Had a cough. No fever. No chills. Feeling better.   DEPRESSION Mood status: stable Satisfied with current treatment?: yes Symptom severity: mild  Duration of current treatment : N/A Side effects: no Medication compliance: N/A Psychotherapy/counseling: no  Depressed mood: no Anxious mood: no Anhedonia: no Significant weight loss or gain: no Insomnia: no  Fatigue: yes Feelings of worthlessness or guilt: no Impaired concentration/indecisiveness: no Suicidal ideations: no Hopelessness: no Crying spells: no    07/10/2023    1:10 PM 12/25/2022    2:26 PM 12/15/2022   11:23 AM 06/15/2022    1:34 PM 05/09/2022    3:10 PM  Depression screen PHQ 2/9  Decreased Interest 2 0 2 0 0  Down, Depressed, Hopeless 0 0 0 0 0  PHQ - 2 Score 2 0 2 0 0  Altered sleeping 3 0 3 3 1   Tired, decreased energy 2 0 0 3 1  Change in appetite 2 0 3 3 1   Feeling bad or failure about yourself  0 0 0 0 0  Trouble concentrating 0 0 0 0 0  Moving slowly or fidgety/restless 0 0 0 0 0  Suicidal thoughts 0 0 0 0 0  PHQ-9 Score 9 0 8 9 3    HYPERTENSION / HYPERLIPIDEMIA Satisfied with current treatment? yes Duration of hypertension: chronic BP monitoring frequency: not checking BP medication side effects: no Duration of hyperlipidemia: chronic Cholesterol medication side effects: no Cholesterol supplements: none Medication compliance: N/A Aspirin: no Recent stressors: no Recurrent headaches: no Visual changes: no Palpitations: no Dyspnea: no Chest pain: no Lower extremity edema: no Dizzy/lightheaded:  no  Menopausal Symptoms: no  Depression Screen done today and results listed below:     07/10/2023    1:10 PM 12/25/2022    2:26 PM 12/15/2022   11:23 AM 06/15/2022    1:34 PM 05/09/2022    3:10 PM  Depression screen PHQ 2/9  Decreased Interest 2 0 2 0 0  Down, Depressed, Hopeless 0 0 0 0 0  PHQ - 2 Score 2 0 2 0 0  Altered sleeping 3 0 3 3 1   Tired, decreased energy 2 0 0 3 1  Change in appetite 2 0 3 3 1   Feeling bad or failure about yourself  0 0 0 0 0  Trouble concentrating 0 0 0 0 0  Moving slowly or fidgety/restless 0 0 0 0 0  Suicidal thoughts 0 0 0 0 0  PHQ-9 Score 9 0 8 9 3     Past Medical History:  Past Medical History:  Diagnosis Date   Anxiety    Arthritis    Asthma    Complication of surgical procedure 03/08/2018   Constipation 07/17/2016   Depression    Headache    History of hiatal hernia    Hypertension    Immobility 09/25/2016   Kidney cyst, acquired    Seizures (HCC)    hx. as child   Senile purpura (HCC) 04/16/2022   Shortness of breath  dyspnea    with excertion    Surgical History:  Past Surgical History:  Procedure Laterality Date   ABDOMINAL HYSTERECTOMY     BACK SURGERY     colonoscopy a year ago     LEG SURGERY Left 03/2015   mass removed from back - 20 years ago     SPINAL CORD STIMULATOR INSERTION N/A 01/17/2017   Procedure: LUMBAR SPINAL CORD STIMULATOR INSERTION;  Surgeon: Venita Lick, MD;  Location: [facility];  Service: Orthopedics;  Laterality: N/A;  Requests 3 hrs   TONSILLECTOMY     TOTAL KNEE REVISION Left 05/13/2015   Procedure: REVISION LEFT  KNEE ARTHROPLASTY, REVISION PATELLA POLY EXCHANGE;  Surgeon: Samson Frederic, MD;  Location: [facility];  Service: Orthopedics;  Laterality: Left;    Medications:  Current Outpatient Medications on File Prior to Visit  Medication Sig   levocetirizine (XYZAL) 5 MG tablet Take 1 tablet (5 mg total) by mouth every evening.   polyethylene glycol powder (GLYCOLAX/MIRALAX) 17 GM/SCOOP powder Take 17 g  by mouth 2 (two) times daily as needed.   Vitamin D, Ergocalciferol, (DRISDOL) 1.25 MG (50000 UNIT) CAPS capsule Take 1 capsule (50,000 Units total) by mouth every 7 (seven) days.   No current facility-administered medications on file prior to visit.    Allergies:  Allergies  Allergen Reactions   Bactrim [Sulfamethoxazole-Trimethoprim] Other (See Comments)    Sweating, feeling like throat is closing up   Pollen Extract    Codeine Rash    Social History:  Social History   Socioeconomic History   Marital status: Divorced    Spouse name: Not on file   Number of children: Not on file   Years of education: Not on file   Highest education level: Not on file  Occupational History   Not on file  Tobacco Use   Smoking status: Never   Smokeless tobacco: Never  Vaping Use   Vaping status: Never Used  Substance and Sexual Activity   Alcohol use: No    Alcohol/week: 0.0 standard drinks of alcohol   Drug use: No   Sexual activity: Never  Other Topics Concern   Not on file  Social History Narrative   Not on file   Social Determinants of Health   Financial Resource Strain: Low Risk  (12/25/2022)   Overall Financial Resource Strain (CARDIA)    Difficulty of Paying Living Expenses: Not hard at all  Food Insecurity: No Food Insecurity (12/25/2022)   Hunger Vital Sign    Worried About Running Out of Food in the Last Year: Never true    Ran Out of Food in the Last Year: Never true  Transportation Needs: No Transportation Needs (12/25/2022)   PRAPARE - Administrator, Civil Service (Medical): No    Lack of Transportation (Non-Medical): No  Physical Activity: Inactive (12/25/2022)   Exercise Vital Sign    Days of Exercise per Week: 0 days    Minutes of Exercise per Session: 0 min  Stress: No Stress Concern Present (12/25/2022)   [employer] of Occupational Health - Occupational Stress Questionnaire    Feeling of Stress : Not at all  Social Connections: Unknown  (12/25/2022)   Social Connection and Isolation Panel [NHANES]    Frequency of Communication with Friends and Family: Not on file    Frequency of Social Gatherings with Friends and Family: Once a week    Attends Religious Services: Never    Database administrator or Organizations: No  Attends Banker Meetings: Never    Marital Status: Divorced  Catering manager Violence: Not At Risk (12/25/2022)   Humiliation, Afraid, Rape, and Kick questionnaire    Fear of Current or Ex-Partner: No    Emotionally Abused: No    Physically Abused: No    Sexually Abused: No   Social History   Tobacco Use  Smoking Status Never  Smokeless Tobacco Never   Social History   Substance and Sexual Activity  Alcohol Use No   Alcohol/week: 0.0 standard drinks of alcohol    Family History:  Family History  Problem Relation Age of Onset   Cancer Mother    Cancer Father    Diabetes Brother    Cancer Brother    Cancer Brother     Past medical history, surgical history, medications, allergies, family history and social history reviewed with patient today and changes made to appropriate areas of the chart.   Review of Systems  Constitutional: Negative.   HENT:  Positive for hearing loss. Negative for congestion, ear discharge, ear pain, nosebleeds, sinus pain, sore throat and tinnitus.   Eyes:  Positive for blurred vision. Negative for double vision, photophobia, pain, discharge and redness.  Respiratory:  Positive for cough. Negative for hemoptysis, sputum production, shortness of breath, wheezing and stridor.   Cardiovascular: Negative.   Gastrointestinal:  Positive for diarrhea. Negative for abdominal pain, blood in stool, constipation, heartburn, melena, nausea and vomiting.  Genitourinary:  Positive for frequency. Negative for dysuria, flank pain, hematuria and urgency.  Musculoskeletal:  Positive for back pain and myalgias. Negative for falls, joint pain and neck pain.  Skin:  Negative.   Neurological:  Positive for tingling. Negative for dizziness, tremors, sensory change, speech change, focal weakness, seizures, loss of consciousness, weakness and headaches.  Endo/Heme/Allergies:  Positive for environmental allergies. Negative for polydipsia. Bruises/bleeds easily.  Psychiatric/Behavioral:  Negative for depression, hallucinations, memory loss, substance abuse and suicidal ideas. The patient is nervous/anxious. The patient does not have insomnia.    All other ROS negative except what is listed above and in the HPI.      Objective:    BP 136/84   Pulse 70   Ht 5\' 2"  (1.575 m)   Wt 155 lb (70.3 kg)   LMP  (LMP Unknown)   SpO2 98%   BMI 28.35 kg/m   Wt Readings from Last 3 Encounters:  07/10/23 155 lb (70.3 kg)  05/25/23 155 lb (70.3 kg)  12/25/22 159 lb (72.1 kg)    Physical Exam Vitals and nursing note reviewed.  Constitutional:      General: She is not in acute distress.    Appearance: Normal appearance. She is not ill-appearing, toxic-appearing or diaphoretic.  HENT:     Head: Normocephalic and atraumatic.     Right Ear: Tympanic membrane, ear canal and external ear normal. There is no impacted cerumen.     Left Ear: Tympanic membrane, ear canal and external ear normal. There is no impacted cerumen.     Nose: Nose normal. No congestion or rhinorrhea.     Mouth/Throat:     Mouth: Mucous membranes are moist.     Pharynx: Oropharynx is clear. No oropharyngeal exudate or posterior oropharyngeal erythema.  Eyes:     General: No scleral icterus.       Right eye: No discharge.        Left eye: No discharge.     Extraocular Movements: Extraocular movements intact.     Conjunctiva/sclera: Conjunctivae  normal.     Pupils: Pupils are equal, round, and reactive to light.  Neck:     Vascular: No carotid bruit.  Cardiovascular:     Rate and Rhythm: Normal rate and regular rhythm.     Pulses: Normal pulses.     Heart sounds: No murmur heard.    No  friction rub. No gallop.  Pulmonary:     Effort: Pulmonary effort is normal. No respiratory distress.     Breath sounds: Normal breath sounds. No stridor. No wheezing, rhonchi or rales.  Chest:     Chest wall: No tenderness.  Abdominal:     General: Abdomen is flat. Bowel sounds are normal. There is no distension.     Palpations: Abdomen is soft. There is no mass.     Tenderness: There is no abdominal tenderness. There is no right CVA tenderness, left CVA tenderness, guarding or rebound.     Hernia: No hernia is present.  Genitourinary:    Comments: Breast and pelvic exams deferred with shared decision making Musculoskeletal:        General: No swelling, tenderness, deformity or signs of injury.     Cervical back: Normal range of motion and neck supple. No rigidity. No muscular tenderness.     Right lower leg: No edema.     Left lower leg: No edema.  Lymphadenopathy:     Cervical: No cervical adenopathy.  Skin:    General: Skin is warm and dry.     Capillary Refill: Capillary refill takes less than 2 seconds.     Coloration: Skin is not jaundiced or pale.     Findings: No bruising, erythema, lesion or rash.  Neurological:     General: No focal deficit present.     Mental Status: She is alert and oriented to person, place, and time. Mental status is at baseline.     Cranial Nerves: No cranial nerve deficit.     Sensory: No sensory deficit.     Motor: No weakness.     Coordination: Coordination normal.     Gait: Gait normal.     Deep Tendon Reflexes: Reflexes normal.  Psychiatric:        Mood and Affect: Mood normal.        Behavior: Behavior normal.        Thought Content: Thought content normal.        Judgment: Judgment normal.     Results for orders placed or performed in visit on 05/25/23  Urine Culture   Specimen: Urine   UR  Result Value Ref Range   Urine Culture, Routine Final report (A)    Organism ID, Bacteria Comment (A)    ORGANISM ID, BACTERIA Comment    Microscopic Examination   Urine  Result Value Ref Range   WBC, UA 6-10 (A) 0 - 5 /hpf   RBC, Urine 3-10 (A) 0 - 2 /hpf   Epithelial Cells (non renal) 0-10 0 - 10 /hpf   Mucus, UA Present (A) Not Estab.   Bacteria, UA None seen None seen/Few  Urinalysis, Routine w reflex microscopic  Result Value Ref Range   Specific Gravity, UA 1.015 1.005 - 1.030   pH, UA 6.0 5.0 - 7.5   Color, UA Yellow Yellow   Appearance Ur Cloudy (A) Clear   Leukocytes,UA 1+ (A) Negative   Protein,UA Negative Negative/Trace   Glucose, UA Negative Negative   Ketones, UA Negative Negative   RBC, UA 2+ (A) Negative   Bilirubin,  UA Negative Negative   Urobilinogen, Ur 0.2 0.2 - 1.0 mg/dL   Nitrite, UA Negative Negative   Microscopic Examination See below:       Assessment & Plan:   Problem List Items Addressed This Visit       Cardiovascular and Mediastinum   Senile purpura (HCC)    Reassured patient. Continue to monitor.       Relevant Orders   CBC with Differential/Platelet   Comprehensive metabolic panel     Genitourinary   Benign hypertensive renal disease    Doing well not on medicine. Continue to monitor. Call with any concerns.       Relevant Orders   Microalbumin, Urine Waived   Comprehensive metabolic panel   TSH   Chronic kidney disease, stage 3 (HCC)    Rechecking labs today. Await results.       Relevant Orders   Comprehensive metabolic panel     Other   Anxiety    Doing well not on medicine. Continue to monitor. Call with any concerns.       Relevant Orders   Comprehensive metabolic panel   Depression, recurrent (HCC)    Doing well not on medicine. Continue to monitor. Call with any concerns.       Relevant Orders   Comprehensive metabolic panel   Hyperlipidemia    Doing well not on medicine. Continue to monitor. Call with any concerns.       Relevant Orders   Comprehensive metabolic panel   Lipid Panel w/o Chol/HDL Ratio   Vitamin D deficiency    Rechecking  labs today. Await results.       Relevant Orders   VITAMIN D 25 Hydroxy (Vit-D Deficiency, Fractures)   Comprehensive metabolic panel   Other Visit Diagnoses     Routine general medical examination at a health care facility    -  Primary   Vaccines up to date. Screening labs checked today. DEXA up to date. Continue diet and exercise. Call with any concerns.        Follow up plan: Return in about 6 months (around 01/07/2024).   LABORATORY TESTING:  - Pap smear: not applicable  IMMUNIZATIONS:   - Tdap: Tetanus vaccination status reviewed: last tetanus booster within 10 years. - Influenza: Up to date - Pneumovax: Up to date - Prevnar: Up to date - COVID: Refused - HPV: Not applicable - Shingrix vaccine: Refused  SCREENING: -Mammogram: Not applicable  - Colonoscopy: Not applicable  - Bone Density: Ordered today   PATIENT COUNSELING:   Advised to take 1 mg of folate supplement per day if capable of pregnancy.   Sexuality: Discussed sexually transmitted diseases, partner selection, use of condoms, avoidance of unintended pregnancy  and contraceptive alternatives.   Advised to avoid cigarette smoking.  I discussed with the patient that most people either abstain from alcohol or drink within safe limits (<=14/week and <=4 drinks/occasion for males, <=7/weeks and <= 3 drinks/occasion for females) and that the risk for alcohol disorders and other health effects rises proportionally with the number of drinks per week and how often a drinker exceeds daily limits.  Discussed cessation/primary prevention of drug use and availability of treatment for abuse.   Diet: Encouraged to adjust caloric intake to maintain  or achieve ideal body weight, to reduce intake of dietary saturated fat and total fat, to limit sodium intake by avoiding high sodium foods and not adding table salt, and to maintain adequate dietary potassium and calcium preferably from  fresh fruits, vegetables, and low-fat  dairy products.    stressed the importance of regular exercise  Injury prevention: Discussed safety belts, safety helmets, smoke detector, smoking near bedding or upholstery.   Dental health: Discussed importance of regular tooth brushing, flossing, and dental visits.    NEXT PREVENTATIVE PHYSICAL DUE IN 1 YEAR. Return in about 6 months (around 01/07/2024).

## 2023-07-10 NOTE — Assessment & Plan Note (Signed)
Reassured patient. Continue to monitor.  

## 2023-07-11 LAB — CBC WITH DIFFERENTIAL/PLATELET
Basophils Absolute: 0.1 10*3/uL (ref 0.0–0.2)
Basos: 1 %
EOS (ABSOLUTE): 0.1 10*3/uL (ref 0.0–0.4)
Eos: 1 %
Hematocrit: 43.2 % (ref 34.0–46.6)
Hemoglobin: 13.6 g/dL (ref 11.1–15.9)
Immature Grans (Abs): 0 10*3/uL (ref 0.0–0.1)
Immature Granulocytes: 0 %
Lymphocytes Absolute: 3.2 10*3/uL — ABNORMAL HIGH (ref 0.7–3.1)
Lymphs: 37 %
MCH: 28.7 pg (ref 26.6–33.0)
MCHC: 31.5 g/dL (ref 31.5–35.7)
MCV: 91 fL (ref 79–97)
Monocytes Absolute: 0.7 10*3/uL (ref 0.1–0.9)
Monocytes: 8 %
Neutrophils Absolute: 4.5 10*3/uL (ref 1.4–7.0)
Neutrophils: 53 %
Platelets: 253 10*3/uL (ref 150–450)
RBC: 4.74 x10E6/uL (ref 3.77–5.28)
RDW: 13.7 % (ref 11.7–15.4)
WBC: 8.6 10*3/uL (ref 3.4–10.8)

## 2023-07-11 LAB — COMPREHENSIVE METABOLIC PANEL
ALT: 18 [IU]/L (ref 0–32)
AST: 23 [IU]/L (ref 0–40)
Albumin: 3.8 g/dL (ref 3.8–4.8)
Alkaline Phosphatase: 83 [IU]/L (ref 44–121)
BUN/Creatinine Ratio: 11 — ABNORMAL LOW (ref 12–28)
BUN: 11 mg/dL (ref 8–27)
Bilirubin Total: 0.4 mg/dL (ref 0.0–1.2)
CO2: 24 mmol/L (ref 20–29)
Calcium: 9.7 mg/dL (ref 8.7–10.3)
Chloride: 99 mmol/L (ref 96–106)
Creatinine, Ser: 1.04 mg/dL — ABNORMAL HIGH (ref 0.57–1.00)
Globulin, Total: 2.8 g/dL (ref 1.5–4.5)
Glucose: 81 mg/dL (ref 70–99)
Potassium: 4.5 mmol/L (ref 3.5–5.2)
Sodium: 138 mmol/L (ref 134–144)
Total Protein: 6.6 g/dL (ref 6.0–8.5)
eGFR: 55 mL/min/{1.73_m2} — ABNORMAL LOW (ref >59)

## 2023-07-11 LAB — LIPID PANEL W/O CHOL/HDL RATIO
Cholesterol, Total: 223 mg/dL — ABNORMAL HIGH (ref 100–199)
HDL: 47 mg/dL (ref >39)
LDL Chol Calc (NIH): 152 mg/dL — ABNORMAL HIGH (ref 0–99)
Triglycerides: 134 mg/dL (ref 0–149)
VLDL Cholesterol Cal: 24 mg/dL (ref 5–40)

## 2023-07-11 LAB — TSH: TSH: 2.27 u[IU]/mL (ref 0.450–4.500)

## 2023-07-11 LAB — VITAMIN D 25 HYDROXY (VIT D DEFICIENCY, FRACTURES): Vit D, 25-Hydroxy: 25.1 ng/mL — ABNORMAL LOW (ref 30.0–100.0)

## 2023-07-24 ENCOUNTER — Encounter: Payer: Self-pay | Admitting: Family Medicine

## 2023-08-10 ENCOUNTER — Encounter: Payer: Self-pay | Admitting: Family Medicine

## 2023-12-31 ENCOUNTER — Telehealth: Payer: Self-pay | Admitting: Family Medicine

## 2023-12-31 NOTE — Telephone Encounter (Signed)
 Copied from CRM [id number]. Topic: Medicare AWV >> Dec 31, 2023  3:14 PM Juliana Ocean wrote: Reason for CRM: LVM 12/31/23 to confirm AWV appt 01/01/2024 at 3:10pm  Rosalee Collins; Care Guide Ambulatory Clinical Support [place] [facility] Direct Dial: [phone]

## 2024-01-04 ENCOUNTER — Encounter: Payer: Self-pay | Admitting: Family Medicine

## 2024-01-04 ENCOUNTER — Ambulatory Visit (INDEPENDENT_AMBULATORY_CARE_PROVIDER_SITE_OTHER): Payer: Self-pay | Admitting: Family Medicine

## 2024-01-04 VITALS — BP 127/79 | HR 70 | Ht 62.0 in | Wt 150.8 lb

## 2024-01-04 DIAGNOSIS — D692 Other nonthrombocytopenic purpura: Secondary | ICD-10-CM | POA: Diagnosis not present

## 2024-01-04 DIAGNOSIS — F339 Major depressive disorder, recurrent, unspecified: Secondary | ICD-10-CM

## 2024-01-04 DIAGNOSIS — N3001 Acute cystitis with hematuria: Secondary | ICD-10-CM

## 2024-01-04 DIAGNOSIS — E782 Mixed hyperlipidemia: Secondary | ICD-10-CM

## 2024-01-04 DIAGNOSIS — E559 Vitamin D deficiency, unspecified: Secondary | ICD-10-CM | POA: Diagnosis not present

## 2024-01-04 DIAGNOSIS — M79605 Pain in left leg: Secondary | ICD-10-CM

## 2024-01-04 DIAGNOSIS — I129 Hypertensive chronic kidney disease with stage 1 through stage 4 chronic kidney disease, or unspecified chronic kidney disease: Secondary | ICD-10-CM

## 2024-01-04 DIAGNOSIS — R300 Dysuria: Secondary | ICD-10-CM | POA: Diagnosis not present

## 2024-01-04 LAB — MICROSCOPIC EXAMINATION: Bacteria, UA: NONE SEEN

## 2024-01-04 LAB — URINALYSIS, ROUTINE W REFLEX MICROSCOPIC
Bilirubin, UA: NEGATIVE
Glucose, UA: NEGATIVE
Ketones, UA: NEGATIVE
Nitrite, UA: NEGATIVE
Protein,UA: NEGATIVE
Specific Gravity, UA: <1.005 — ABNORMAL LOW (ref 1.005–1.030)
Urobilinogen, Ur: 0.2 mg/dL (ref 0.2–1.0)
pH, UA: 6 (ref 5.0–7.5)

## 2024-01-04 MED ORDER — VITAMIN D (ERGOCALCIFEROL) 1.25 MG (50000 UNIT) PO CAPS: 50000.0000 [IU] | ORAL_CAPSULE | ORAL | 1 refills | Status: AC

## 2024-01-04 MED ORDER — LEVOCETIRIZINE DIHYDROCHLORIDE 5 MG PO TABS: 5.0000 mg | ORAL_TABLET | Freq: Every evening | ORAL | 4 refills | Status: AC

## 2024-01-04 MED ORDER — CIPROFLOXACIN HCL 500 MG PO TABS: 500.0000 mg | ORAL_TABLET | Freq: Two times a day (BID) | ORAL | 0 refills | Status: AC

## 2024-01-04 MED ORDER — GABAPENTIN 100 MG PO CAPS: 100.0000 mg | ORAL_CAPSULE | Freq: Every day | ORAL | 1 refills | Status: AC

## 2024-01-04 NOTE — Assessment & Plan Note (Signed)
 Rechecking labs today. Await results. Treat as needed.

## 2024-01-04 NOTE — Assessment & Plan Note (Signed)
Doing well not on medicine. Continue to monitor. Call with any concerns.

## 2024-01-04 NOTE — Assessment & Plan Note (Signed)
 Reassured patient. Check labs today.

## 2024-01-04 NOTE — Progress Notes (Signed)
 BP 127/79 (BP Location: Left Arm, Patient Position: Sitting, Cuff Size: Normal)   Pulse 70   Ht 5\' 2"  (1.575 m)   Wt 150 lb 12.8 oz (68.4 kg)   LMP  (LMP Unknown)   BMI 27.58 kg/m    Subjective:    Patient ID: Renee Bridges, female    DOB: 06-18-1946, 78 y.o.   MRN: [id number]  HPI: Renee Bridges is a 78 y.o. female  Chief Complaint  Patient presents with   Depression   Urinary Tract Infection    Symptoms started last month. Dysuria. Abd pressure.    URINARY SYMPTOMS Duration: about a month Dysuria: burning Urinary frequency: yes Urgency: yes Small volume voids: yes Symptom severity: moderate Urinary incontinence: no Foul odor: yes Hematuria: no Abdominal pain: yes Back pain: no Suprapubic pain/pressure: yes Flank pain: no Fever:  no Vomiting: no Relief with cranberry juice: no Relief with pyridium: no Status: stable Previous urinary tract infection: yes Recurrent urinary tract infection: no Sexual activity: No sexually active History of sexually transmitted disease: no Vaginal discharge: no Treatments attempted: increasing fluids   HYPERTENSION / HYPERLIPIDEMIA Satisfied with current treatment? yes Duration of hypertension: chronic BP monitoring frequency: rarely BP medication side effects: no Past BP meds: not on anything Duration of hyperlipidemia: chronic Cholesterol medication side effects: N/A Cholesterol supplements: none Medication compliance: N/A Aspirin: no Recent stressors: no Recurrent headaches: no Visual changes: no Palpitations: no Dyspnea: no Chest pain: no Lower extremity edema: no Dizzy/lightheaded: no  DEPRESSION Mood status: controlled Satisfied with current treatment?: yes Symptom severity: mild  Duration of current treatment : not on anything Side effects: N/A Psychotherapy/counseling: no  Depressed mood: no Anxious mood: no Anhedonia: no Significant weight loss or gain: no Insomnia: no Fatigue: no Feelings of  worthlessness or guilt: no Impaired concentration/indecisiveness: no Suicidal ideations: no Hopelessness: no Crying spells: no    01/04/2024   11:31 AM 07/10/2023    1:10 PM 12/25/2022    2:26 PM 12/15/2022   11:23 AM 06/15/2022    1:34 PM  Depression screen PHQ 2/9  Decreased Interest 0 2 0 2 0  Down, Depressed, Hopeless 0 0 0 0 0  PHQ - 2 Score 0 2 0 2 0  Altered sleeping 3 3 0 3 3  Tired, decreased energy 3 2 0 0 3  Change in appetite 0 2 0 3 3  Feeling bad or failure about yourself  0 0 0 0 0  Trouble concentrating 0 0 0 0 0  Moving slowly or fidgety/restless 3 0 0 0 0  Suicidal thoughts 0 0 0 0 0  PHQ-9 Score 9 9 0 8 9    Relevant past medical, surgical, family and social history reviewed and updated as indicated. Interim medical history since our last visit reviewed. Allergies and medications reviewed and updated.  Review of Systems  Constitutional: Negative.   Respiratory: Negative.    Cardiovascular: Negative.   Gastrointestinal: Negative.   Musculoskeletal: Negative.   Psychiatric/Behavioral: Negative.      Per HPI unless specifically indicated above     Objective:    BP 127/79 (BP Location: Left Arm, Patient Position: Sitting, Cuff Size: Normal)   Pulse 70   Ht 5\' 2"  (1.575 m)   Wt 150 lb 12.8 oz (68.4 kg)   LMP  (LMP Unknown)   BMI 27.58 kg/m   Wt Readings from Last 3 Encounters:  01/04/24 150 lb 12.8 oz (68.4 kg)  07/10/23 155 lb (70.3 kg)  05/25/23 155 lb (70.3 kg)    Physical Exam Vitals and nursing note reviewed.  Constitutional:      General: She is not in acute distress.    Appearance: Normal appearance. She is not ill-appearing, toxic-appearing or diaphoretic.  HENT:     Head: Normocephalic and atraumatic.     Right Ear: External ear normal.     Left Ear: External ear normal.     Nose: Nose normal.     Mouth/Throat:     Mouth: Mucous membranes are moist.     Pharynx: Oropharynx is clear.  Eyes:     General: No scleral icterus.        Right eye: No discharge.        Left eye: No discharge.     Extraocular Movements: Extraocular movements intact.     Conjunctiva/sclera: Conjunctivae normal.     Pupils: Pupils are equal, round, and reactive to light.  Cardiovascular:     Rate and Rhythm: Normal rate and regular rhythm.     Pulses: Normal pulses.     Heart sounds: Normal heart sounds. No murmur heard.    No friction rub. No gallop.  Pulmonary:     Effort: Pulmonary effort is normal. No respiratory distress.     Breath sounds: Normal breath sounds. No stridor. No wheezing, rhonchi or rales.  Chest:     Chest wall: No tenderness.  Musculoskeletal:        General: Normal range of motion.     Cervical back: Normal range of motion and neck supple.  Skin:    General: Skin is warm and dry.     Capillary Refill: Capillary refill takes less than 2 seconds.     Coloration: Skin is not jaundiced or pale.     Findings: No bruising, erythema, lesion or rash.  Neurological:     General: No focal deficit present.     Mental Status: She is alert and oriented to person, place, and time. Mental status is at baseline.  Psychiatric:        Mood and Affect: Mood normal.        Behavior: Behavior normal.        Thought Content: Thought content normal.        Judgment: Judgment normal.     Results for orders placed or performed in visit on 07/10/23  Microalbumin, Urine Waived   Collection Time: 07/10/23  1:28 PM  Result Value Ref Range   Microalb, Ur Waived 10 0 - 19 mg/L   Creatinine, Urine Waived 50 10 - 300 mg/dL   Microalb/Creat Ratio 30-300 (H) <30 mg/g  VITAMIN D  25 Hydroxy (Vit-D Deficiency, Fractures)   Collection Time: 07/10/23  1:29 PM  Result Value Ref Range   Vit D, 25-Hydroxy 25.1 (L) 30.0 - 100.0 ng/mL  CBC with Differential/Platelet   Collection Time: 07/10/23  1:29 PM  Result Value Ref Range   WBC 8.6 3.4 - 10.8 x10E3/uL   RBC 4.74 3.77 - 5.28 x10E6/uL   Hemoglobin 13.6 11.1 - 15.9 g/dL   Hematocrit 03.4  74.2 - 46.6 %   MCV 91 79 - 97 fL   MCH 28.7 26.6 - 33.0 pg   MCHC 31.5 31.5 - 35.7 g/dL   RDW 59.5 63.8 - 75.6 %   Platelets 253 150 - 450 x10E3/uL   Neutrophils 53 Not Estab. %   Lymphs 37 Not Estab. %   Monocytes 8 Not Estab. %   Eos 1 Not Estab. %  Basos 1 Not Estab. %   Neutrophils Absolute 4.5 1.4 - 7.0 x10E3/uL   Lymphocytes Absolute 3.2 (H) 0.7 - 3.1 x10E3/uL   Monocytes Absolute 0.7 0.1 - 0.9 x10E3/uL   EOS (ABSOLUTE) 0.1 0.0 - 0.4 x10E3/uL   Basophils Absolute 0.1 0.0 - 0.2 x10E3/uL   Immature Granulocytes 0 Not Estab. %   Immature Grans (Abs) 0.0 0.0 - 0.1 x10E3/uL  Comprehensive metabolic panel   Collection Time: 07/10/23  1:29 PM  Result Value Ref Range   Glucose 81 70 - 99 mg/dL   BUN 11 8 - 27 mg/dL   Creatinine, Ser 2.95 (H) 0.57 - 1.00 mg/dL   eGFR 55 (L) >28 UX/LKG/4.01   BUN/Creatinine Ratio 11 (L) 12 - 28   Sodium 138 134 - 144 mmol/L   Potassium 4.5 3.5 - 5.2 mmol/L   Chloride 99 96 - 106 mmol/L   CO2 24 20 - 29 mmol/L   Calcium  9.7 8.7 - 10.3 mg/dL   Total Protein 6.6 6.0 - 8.5 g/dL   Albumin 3.8 3.8 - 4.8 g/dL   Globulin, Total 2.8 1.5 - 4.5 g/dL   Bilirubin Total 0.4 0.0 - 1.2 mg/dL   Alkaline Phosphatase 83 44 - 121 IU/L   AST 23 0 - 40 IU/L   ALT 18 0 - 32 IU/L  Lipid Panel w/o Chol/HDL Ratio   Collection Time: 07/10/23  1:29 PM  Result Value Ref Range   Cholesterol, Total 223 (H) 100 - 199 mg/dL   Triglycerides 027 0 - 149 mg/dL   HDL 47 >25 mg/dL   VLDL Cholesterol Cal 24 5 - 40 mg/dL   LDL Chol Calc (NIH) 366 (H) 0 - 99 mg/dL  TSH   Collection Time: 07/10/23  1:29 PM  Result Value Ref Range   TSH 2.270 0.450 - 4.500 uIU/mL      Assessment & Plan:   Problem List Items Addressed This Visit       Cardiovascular and Mediastinum   Senile purpura (HCC)   Reassured patient. Check labs today.       Relevant Orders   CBC with Differential/Platelet   Comprehensive metabolic panel with GFR     Genitourinary   Benign hypertensive  renal disease - Primary   Doing well not on medicine. Continue to monitor. Call with any concerns.       Relevant Orders   Comprehensive metabolic panel with GFR     Other   Depression, recurrent (HCC)   Doing well not on medicine. Continue to monitor. Call with any concerns.       Relevant Orders   Comprehensive metabolic panel with GFR   VITAMIN D  25 Hydroxy (Vit-D Deficiency, Fractures)   Hyperlipidemia   Doing well not on medicine. Continue to monitor. Call with any concerns.       Relevant Orders   Lipid Panel w/o Chol/HDL Ratio   Comprehensive metabolic panel with GFR   Vitamin D  deficiency   Rechecking labs today. Await results. Treat as needed.       Relevant Orders   Comprehensive metabolic panel with GFR   Other Visit Diagnoses       Dysuria       + UTI   Relevant Orders   Urinalysis, Routine w reflex microscopic     Acute cystitis with hematuria       Will treat with cipro  pending culture. Call with any concerns. Will recheck urine in like 10 days   Relevant Orders  Urine Culture   Urine Culture   Urinalysis, Routine w reflex microscopic     Left leg pain       Will start her on gabapentin. Recheck 2 months. Call with any concerns.        Follow up plan: Return in about 2 months (around 03/05/2024) for follow up leg pain.

## 2024-01-05 LAB — VITAMIN D 25 HYDROXY (VIT D DEFICIENCY, FRACTURES): Vit D, 25-Hydroxy: 24.9 ng/mL — ABNORMAL LOW (ref 30.0–100.0)

## 2024-01-05 LAB — COMPREHENSIVE METABOLIC PANEL WITH GFR
ALT: 11 IU/L (ref 0–32)
AST: 17 IU/L (ref 0–40)
Albumin: 4.1 g/dL (ref 3.8–4.8)
Alkaline Phosphatase: 92 IU/L (ref 44–121)
BUN/Creatinine Ratio: 13 (ref 12–28)
BUN: 13 mg/dL (ref 8–27)
Bilirubin Total: 0.4 mg/dL (ref 0.0–1.2)
CO2: 21 mmol/L (ref 20–29)
Calcium: 9.4 mg/dL (ref 8.7–10.3)
Chloride: 101 mmol/L (ref 96–106)
Creatinine, Ser: 1.04 mg/dL — ABNORMAL HIGH (ref 0.57–1.00)
Globulin, Total: 2.7 g/dL (ref 1.5–4.5)
Glucose: 81 mg/dL (ref 70–99)
Potassium: 4.2 mmol/L (ref 3.5–5.2)
Sodium: 139 mmol/L (ref 134–144)
Total Protein: 6.8 g/dL (ref 6.0–8.5)
eGFR: 55 mL/min/{1.73_m2} — ABNORMAL LOW (ref >59)

## 2024-01-05 LAB — CBC WITH DIFFERENTIAL/PLATELET
Basophils Absolute: 0.1 10*3/uL (ref 0.0–0.2)
Basos: 1 %
EOS (ABSOLUTE): 0.1 10*3/uL (ref 0.0–0.4)
Eos: 1 %
Hematocrit: 39.8 % (ref 34.0–46.6)
Hemoglobin: 12.3 g/dL (ref 11.1–15.9)
Immature Grans (Abs): 0 10*3/uL (ref 0.0–0.1)
Immature Granulocytes: 0 %
Lymphocytes Absolute: 3.1 10*3/uL (ref 0.7–3.1)
Lymphs: 30 %
MCH: 28.1 pg (ref 26.6–33.0)
MCHC: 30.9 g/dL — ABNORMAL LOW (ref 31.5–35.7)
MCV: 91 fL (ref 79–97)
Monocytes Absolute: 1 10*3/uL — ABNORMAL HIGH (ref 0.1–0.9)
Monocytes: 9 %
Neutrophils Absolute: 6.1 10*3/uL (ref 1.4–7.0)
Neutrophils: 59 %
Platelets: 274 10*3/uL (ref 150–450)
RBC: 4.37 x10E6/uL (ref 3.77–5.28)
RDW: 12.3 % (ref 11.7–15.4)
WBC: 10.4 10*3/uL (ref 3.4–10.8)

## 2024-01-05 LAB — LIPID PANEL W/O CHOL/HDL RATIO
Cholesterol, Total: 194 mg/dL (ref 100–199)
HDL: 44 mg/dL (ref >39)
LDL Chol Calc (NIH): 127 mg/dL — ABNORMAL HIGH (ref 0–99)
Triglycerides: 128 mg/dL (ref 0–149)
VLDL Cholesterol Cal: 23 mg/dL (ref 5–40)

## 2024-01-06 LAB — URINE CULTURE

## 2024-01-07 ENCOUNTER — Ambulatory Visit: Payer: Medicare HMO | Admitting: Family Medicine

## 2024-01-07 ENCOUNTER — Encounter: Payer: Self-pay | Admitting: Nurse Practitioner

## 2024-01-08 ENCOUNTER — Ambulatory Visit: Admitting: Emergency Medicine

## 2024-01-08 ENCOUNTER — Ambulatory Visit: Payer: Medicare HMO | Admitting: Family Medicine

## 2024-01-08 VITALS — Ht 62.0 in | Wt 150.0 lb

## 2024-01-08 DIAGNOSIS — Z0000 Encounter for general adult medical examination without abnormal findings: Secondary | ICD-10-CM | POA: Diagnosis not present

## 2024-01-08 DIAGNOSIS — Z780 Asymptomatic menopausal state: Secondary | ICD-10-CM

## 2024-01-08 NOTE — Progress Notes (Signed)
 Subjective:   Renee Bridges is a 78 y.o. who presents for a Medicare Wellness preventive visit.  Visit Complete: Virtual I connected with  DAKYRA WASHAM on 01/08/24 by a audio enabled telemedicine application and verified that I am speaking with the correct person using two identifiers.  Patient Location: Home  Provider Location: Home Office  I discussed the limitations of evaluation and management by telemedicine. The patient expressed understanding and agreed to proceed.  Vital Signs: Because this visit was a virtual/telehealth visit, some criteria may be missing or patient reported. Any vitals not documented were not able to be obtained and vitals that have been documented are patient reported.  VideoDeclined- This patient declined Librarian, academic. Therefore the visit was completed with audio only.  Persons Participating in Visit: Patient.  AWV Questionnaire: No: Patient Medicare AWV questionnaire was not completed prior to this visit.  Cardiac Risk Factors include: advanced age (>20men, >76 women);dyslipidemia;hypertension     Objective:    Today's Vitals   01/08/24 1121  Weight: 150 lb (68 kg)  Height: 5\' 2"  (1.575 m)  PainSc: 9    Body mass index is 27.44 kg/m.     01/08/2024   11:41 AM 12/25/2022    2:28 PM 09/08/2021   12:54 PM 01/12/2020   11:01 AM 08/25/2017    4:23 PM 05/04/2017    9:53 AM 01/10/2017   12:27 PM  Advanced Directives  Does Patient Have a Medical Advance Directive? No No No No No No No  Would patient like information on creating a medical advance directive? Yes (MAU/Ambulatory/Procedural Areas - Information given) No - Patient declined No - Patient declined Yes (MAU/Ambulatory/Procedural Areas - Information given) No - Patient declined Yes (MAU/Ambulatory/Procedural Areas - Information given) No - Patient declined    Current Medications (verified) Outpatient Encounter Medications as of 01/08/2024  Medication Sig    ciprofloxacin  (CIPRO ) 500 MG tablet Take 1 tablet (500 mg total) by mouth 2 (two) times daily for 7 days.   gabapentin (NEURONTIN) 100 MG capsule Take 1 capsule (100 mg total) by mouth at bedtime.   levocetirizine (XYZAL ) 5 MG tablet Take 1 tablet (5 mg total) by mouth every evening.   Vitamin D , Ergocalciferol , (DRISDOL ) 1.25 MG (50000 UNIT) CAPS capsule Take 1 capsule (50,000 Units total) by mouth every 7 (seven) days.   mupirocin  ointment (BACTROBAN ) 2 % Apply 1 Application topically 2 (two) times daily. At the opening of the ear canals (Patient not taking: Reported on 01/08/2024)   polyethylene glycol powder (GLYCOLAX /MIRALAX ) 17 GM/SCOOP powder Take 17 g by mouth 2 (two) times daily as needed. (Patient not taking: Reported on 01/08/2024)   No facility-administered encounter medications on file as of 01/08/2024.    Allergies (verified) Bactrim  [sulfamethoxazole -trimethoprim ], Pollen extract, and Codeine   History: Past Medical History:  Diagnosis Date   Anxiety    Arthritis    Asthma    Complication of surgical procedure 03/08/2018   Constipation 07/17/2016   Depression    Headache    History of hiatal hernia    Hypertension    Immobility 09/25/2016   Kidney cyst, acquired    Seizures (HCC)    hx. as child   Senile purpura (HCC) 04/16/2022   Shortness of breath dyspnea    with excertion   Past Surgical History:  Procedure Laterality Date   ABDOMINAL HYSTERECTOMY     BACK SURGERY     colonoscopy a year ago     LEG  SURGERY Left 03/2015   mass removed from back - 20 years ago     SPINAL CORD STIMULATOR INSERTION N/A 01/17/2017   Procedure: LUMBAR SPINAL CORD STIMULATOR INSERTION;  Surgeon: Mort Ards, MD;  Location: [facility] OR;  Service: Orthopedics;  Laterality: N/A;  Requests 3 hrs   TONSILLECTOMY     TOTAL KNEE REVISION Left 05/13/2015   Procedure: REVISION LEFT  KNEE ARTHROPLASTY, REVISION PATELLA POLY EXCHANGE;  Surgeon: Adonica Hoose, MD;  Location: [facility];  Service:  Orthopedics;  Laterality: Left;   Family History  Problem Relation Age of Onset   Cancer Mother    Cancer Father    Diabetes Brother    Cancer Brother    Cancer Brother    Social History   Socioeconomic History   Marital status: Divorced    Spouse name: Not on file   Number of children: 2   Years of education: Not on file   Highest education level: Not on file  Occupational History   Not on file  Tobacco Use   Smoking status: Never    Passive exposure: Past   Smokeless tobacco: Never  Vaping Use   Vaping status: Never Used  Substance and Sexual Activity   Alcohol  use: No    Alcohol /week: 0.0 standard drinks of alcohol    Drug use: No   Sexual activity: Never  Other Topics Concern   Not on file  Social History Narrative   1 living child and 1child deceased   Social Drivers of Corporate investment banker Strain: Low Risk  (01/08/2024)   Overall Financial Resource Strain (CARDIA)    Difficulty of Paying Living Expenses: Not hard at all  Food Insecurity: No Food Insecurity (01/08/2024)   Hunger Vital Sign    Worried About Running Out of Food in the Last Year: Never true    Ran Out of Food in the Last Year: Never true  Transportation Needs: No Transportation Needs (01/08/2024)   PRAPARE - Administrator, Civil Service (Medical): No    Lack of Transportation (Non-Medical): No  Physical Activity: Insufficiently Active (01/08/2024)   Exercise Vital Sign    Days of Exercise per Week: 7 days    Minutes of Exercise per Session: 10 min  Stress: Stress Concern Present (01/08/2024)   [employer] of Occupational Health - Occupational Stress Questionnaire    Feeling of Stress : Very much  Social Connections: Socially Isolated (01/08/2024)   Social Connection and Isolation Panel [NHANES]    Frequency of Communication with Friends and Family: Twice a week    Frequency of Social Gatherings with Friends and Family: Never    Attends Religious Services: Never    Automotive engineer or Organizations: No    Attends Engineer, structural: Never    Marital Status: Divorced    Tobacco Counseling Counseling given: Not Answered    Clinical Intake:  Pre-visit preparation completed: Yes  Pain : 0-10 Pain Score: 9  Pain Type: Acute pain Pain Location: Abdomen (has UTI) Pain Descriptors / Indicators: Aching     BMI - recorded: 27.44 Nutritional Status: BMI 25 -29 Overweight Nutritional Risks: Nausea/ vomitting/ diarrhea (has UTI) Diabetes: No  Lab Results  Component Value Date   HGBA1C 5.7 (H) 04/14/2022   HGBA1C 5.8 05/22/2019   HGBA1C 5.3 04/03/2018     How often do you need to have someone help you when you read instructions, pamphlets, or other written materials from your doctor or pharmacy?:  1 - Never  Interpreter Needed?: No  Information entered by :: Jaunita Messier, CMA   Activities of Daily Living     01/08/2024   11:24 AM  In your present state of health, do you have any difficulty performing the following activities:  Hearing? 1  Comment wears hearing aids  Vision? 1  Comment needs new glasses, has appt 03/10/24  Difficulty concentrating or making decisions? 0  Walking or climbing stairs? 0  Dressing or bathing? 0  Doing errands, shopping? 1  Comment doesn't drive, granddaughter takes to appointments  Preparing Food and eating ? N  Using the Toilet? N  In the past six months, have you accidently leaked urine? Y  Comment wears depend  Do you have problems with loss of bowel control? N  Managing your Medications? N  Managing your Finances? N  Housekeeping or managing your Housekeeping? N    Patient Care Team: Solomon Dupre, DO as PCP - General (Family Medicine) Adonica Hoose, MD as Consulting Physician (Orthopedic Surgery)  Indicate any recent Medical Services you may have received from other than [facility] providers in the past year (date may be approximate).     Assessment:   This is a routine wellness  examination for [place].  Hearing/Vision screen Hearing Screening - Comments:: Wears hearing aids Vision Screening - Comments:: Has appt for eye exam 03/10/24 @ [facility]   Goals Addressed               This Visit's Progress     Patient Stated (pt-stated)        Wants to see grandchildren more often       Depression Screen     01/08/2024   11:36 AM 01/04/2024   11:31 AM 07/10/2023    1:10 PM 12/25/2022    2:26 PM 12/15/2022   11:23 AM 06/15/2022    1:34 PM 05/09/2022    3:10 PM  PHQ 2/9 Scores  PHQ - 2 Score 3 0 2 0 2 0 0  PHQ- 9 Score 9 9 9  0 8 9 3     Fall Risk     01/08/2024   11:44 AM 07/10/2023    1:10 PM 12/25/2022    2:29 PM 12/15/2022   11:24 AM 04/14/2022    3:49 PM  Fall Risk   Falls in the past year? 0 0 0 0 0  Number falls in past yr: 0 0 0 0 0  Injury with Fall? 0 0 0 0 0  Risk for fall due to : No Fall Risks No Fall Risks No Fall Risks No Fall Risks No Fall Risks  Follow up Falls prevention discussed;Falls evaluation completed Falls evaluation completed Falls prevention discussed;Falls evaluation completed Falls evaluation completed Falls evaluation completed    MEDICARE RISK AT HOME:  Medicare Risk at Home Any stairs in or around the home?: No If so, are there any without handrails?: No Home free of loose throw rugs in walkways, pet beds, electrical cords, etc?: No Adequate lighting in your home to reduce risk of falls?: Yes Life alert?: No Use of a cane, walker or w/c?: No Grab bars in the bathroom?: Yes Shower chair or bench in shower?: No Elevated toilet seat or a handicapped toilet?: Yes  TIMED UP AND GO:  Was the test performed?  No  Cognitive Function: 6CIT completed    09/30/2018    4:38 PM 09/12/2018    2:43 PM 12/28/2017   10:50 AM  MMSE -  Mini Mental State Exam  Orientation to time 5 5 5   Orientation to Place 5 4 5   Registration 3 3 3   Attention/ Calculation 4 2 3   Recall 3 3 2   Language- name 2 objects 2 2 2   Language-  repeat 1 0 0  Language- follow 3 step command 2 1 3   Language- read & follow direction 1 0 1  Write a sentence 1 0 1  Copy design 0 0 0  Total score 27 20 25         01/08/2024   11:46 AM 12/15/2022   12:04 PM 09/08/2021   12:56 PM 05/04/2017   10:03 AM  6CIT Screen  What Year? 0 points 0 points 0 points 0 points  What month? 0 points 0 points 0 points 0 points  What time? 0 points 0 points 0 points 0 points  Count back from 20 2 points 0 points 0 points 0 points  Months in reverse 0 points 0 points 0 points 0 points  Repeat phrase 0 points 0 points 4 points 0 points  Total Score 2 points 0 points 4 points 0 points    Immunizations Immunization History  Administered Date(s) Administered   Fluad Quad(high Dose 65+) 05/22/2019, 06/24/2020, 06/19/2022   Fluad Trivalent(High Dose 65+) 05/25/2023   Influenza, High Dose Seasonal PF 07/17/2016, 09/14/2017, 06/10/2018   Influenza,inj,Quad PF,6+ Mos 05/15/2015   PFIZER(Purple Top)SARS-COV-2 Vaccination 11/13/2019, 12/11/2019   Pneumococcal Conjugate-13 12/22/2013   Pneumococcal Polysaccharide-23 12/22/2008, 12/27/2015   Td 02/27/2020   Zoster, Live 12/22/2013    Screening Tests Health Maintenance  Topic Date Due   Zoster Vaccines- Shingrix (1 of 2) 03/04/1994   INFLUENZA VACCINE  04/04/2024   Medicare Annual Wellness (AWV)  01/07/2025   DTaP/Tdap/Td (2 - Tdap) 02/26/2030   Pneumonia Vaccine 78+ Years old  Completed   DEXA SCAN  Completed   Hepatitis C Screening  Completed   HPV VACCINES  Aged Out   Meningococcal B Vaccine  Aged Out   Colonoscopy  Discontinued   COVID-19 Vaccine  Discontinued    Health Maintenance  Health Maintenance Due  Topic Date Due   Zoster Vaccines- Shingrix (1 of 2) 03/04/1994   Health Maintenance Items Addressed: DEXA ordered, See Nurse Notes  Additional Screening:  Vision Screening: Recommended annual ophthalmology exams for early detection of glaucoma and other disorders of the eye.  Dental  Screening: Recommended annual dental exams for proper oral hygiene  Community Resource Referral / Chronic Care Management: CRR required this visit?  No   CCM required this visit?  No     Plan:     I have personally reviewed and noted the following in the patient's chart:   Medical and social history Use of alcohol , tobacco or illicit drugs  Current medications and supplements including opioid prescriptions. Patient is not currently taking opioid prescriptions. Functional ability and status Nutritional status Physical activity Advanced directives List of other physicians Hospitalizations, surgeries, and ER visits in previous 12 months Vitals Screenings to include cognitive, depression, and falls Referrals and appointments  In addition, I have reviewed and discussed with patient certain preventive protocols, quality metrics, and best practice recommendations. A written personalized care plan for preventive services as well as general preventive health recommendations were provided to patient.     Jaunita Messier, CMA   01/08/2024   After Visit Summary: (Mail) Due to this being a telephonic visit, the after visit summary with patients personalized plan was offered to patient via  mail   Notes: Please refer to Routing Comments.

## 2024-01-08 NOTE — Patient Instructions (Addendum)
 Ms. Hakala , Thank you for taking time to come for your Medicare Wellness Visit. I appreciate your ongoing commitment to your health goals. Please review the following plan we discussed and let me know if I can assist you in the future.   Referrals/Orders/Follow-Ups/Clinician Recommendations: Get a routine eye exam as scheduled on 03/10/24 @ [facility]. I have placed an order for a bone density test. Call [facility] @ [phone] to schedule at your earliest convenience.  This is a list of the screening recommended for you and due dates:  Health Maintenance  Topic Date Due   Zoster (Shingles) Vaccine (1 of 2) 03/04/1994   DEXA scan (bone density measurement)  01/15/2023   Flu Shot  04/04/2024   Medicare Annual Wellness Visit  01/07/2025   DTaP/Tdap/Td vaccine (2 - Tdap) 02/26/2030   Pneumonia Vaccine  Completed   Hepatitis C Screening  Completed   HPV Vaccine  Aged Out   Meningitis B Vaccine  Aged Out   Colon Cancer Screening  Discontinued   COVID-19 Vaccine  Discontinued    Advanced directives: (Provided) Advance directive discussed with you today. I have provided a copy for you to complete at home and have notarized. Once this is complete, please bring a copy in to our office so we can scan it into your chart.   Next Medicare Annual Wellness Visit scheduled for next year: Yes, 01/20/25 @ 1:10pm (phone visit)  Have you seen your provider in the last 6 months (3 months if uncontrolled diabetes)? Yes  Fall Prevention in the Home, Adult Falls can cause injuries and affect people of all ages. There are many simple things that you can do to make your home safe and to help prevent falls. If you need it, ask for help making these changes. What actions can I take to prevent falls? General information Use good lighting in all rooms. Make sure to: Replace any light bulbs that burn out. Turn on lights if it is dark and use night-lights. Keep items that you use often in  easy-to-reach places. Lower the shelves around your home if needed. Move furniture so that there are clear paths around it. Do not keep throw rugs or other things on the floor that can make you trip. If any of your floors are uneven, fix them. Add color or contrast paint or tape to clearly mark and help you see: Grab bars or handrails. First and last steps of staircases. Where the edge of each step is. If you use a ladder or stepladder: Make sure that it is fully opened. Do not climb a closed ladder. Make sure the sides of the ladder are locked in place. Have someone hold the ladder while you use it. Know where your pets are as you move through your home. What can I do in the bathroom?     Keep the floor dry. Clean up any water that is on the floor right away. Remove soap buildup in the bathtub or shower. Buildup makes bathtubs and showers slippery. Use non-skid mats or decals on the floor of the bathtub or shower. Attach bath mats securely with double-sided, non-slip rug tape. If you need to sit down while you are in the shower, use a non-slip stool. Install grab bars by the toilet and in the bathtub and shower. Do not use towel bars as grab bars. What can I do in the bedroom? Make sure that you have a light by your bed that is easy to  reach. Do not use any sheets or blankets on your bed that hang to the floor. Have a firm bench or chair with side arms that you can use for support when you get dressed. What can I do in the kitchen? Clean up any spills right away. If you need to reach something above you, use a sturdy step stool that has a grab bar. Keep electrical cables out of the way. Do not use floor polish or wax that makes floors slippery. What can I do with my stairs? Do not leave anything on the stairs. Make sure that you have a light switch at the top and the bottom of the stairs. Have them installed if you do not have them. Make sure that there are handrails on both sides  of the stairs. Fix handrails that are broken or loose. Make sure that handrails are as long as the staircases. Install non-slip stair treads on all stairs in your home if they do not have carpet. Avoid having throw rugs at the top or bottom of stairs, or secure the rugs with carpet tape to prevent them from moving. Choose a carpet design that does not hide the edge of steps on the stairs. Make sure that carpet is firmly attached to the stairs. Fix any carpet that is loose or worn. What can I do on the outside of my home? Use bright outdoor lighting. Repair the edges of walkways and driveways and fix any cracks. Clear paths of anything that can make you trip, such as tools or rocks. Add color or contrast paint or tape to clearly mark and help you see high doorway thresholds. Trim any bushes or trees on the main path into your home. Check that handrails are securely fastened and in good repair. Both sides of all steps should have handrails. Install guardrails along the edges of any raised decks or porches. Have leaves, snow, and ice cleared regularly. Use sand, salt, or ice melt on walkways during winter months if you live where there is ice and snow. In the garage, clean up any spills right away, including grease or oil spills. What other actions can I take? Review your medicines with your health care provider. Some medicines can make you confused or feel dizzy. This can increase your chance of falling. Wear closed-toe shoes that fit well and support your feet. Wear shoes that have rubber soles and low heels. Use a cane, walker, scooter, or crutches that help you move around if needed. Talk with your provider about other ways that you can decrease your risk of falls. This may include seeing a physical therapist to learn to do exercises to improve movement and strength. Where to find more information Centers for Disease Control and Prevention, STEAD[employer]: TonerPromos.no [employer] on Aging:  BaseRingTones.pl National Institute on Aging: BaseRingTones.pl Contact a health care provider if: You are afraid of falling at home. You feel weak, drowsy, or dizzy at home. You fall at home. Get help right away if you: Lose consciousness or have trouble moving after a fall. Have a fall that causes a head injury. These symptoms may be an emergency. Get help right away. Call 911. Do not wait to see if the symptoms will go away. Do not drive yourself to the hospital. This information is not intended to replace advice given to you by your health care provider. Make sure you discuss any questions you have with your health care provider. Document Revised: 04/24/2022 Document Reviewed: 04/24/2022 Elsevier Patient Education  2024 Elsevier Inc.

## 2024-03-05 ENCOUNTER — Ambulatory Visit: Admitting: Family Medicine

## 2024-03-05 VITALS — BP 159/83 | HR 75 | Temp 97.7°F | Ht 62.0 in | Wt 147.6 lb

## 2024-03-05 DIAGNOSIS — F339 Major depressive disorder, recurrent, unspecified: Secondary | ICD-10-CM

## 2024-03-05 DIAGNOSIS — N3001 Acute cystitis with hematuria: Secondary | ICD-10-CM

## 2024-03-05 DIAGNOSIS — E782 Mixed hyperlipidemia: Secondary | ICD-10-CM | POA: Diagnosis not present

## 2024-03-05 DIAGNOSIS — I129 Hypertensive chronic kidney disease with stage 1 through stage 4 chronic kidney disease, or unspecified chronic kidney disease: Secondary | ICD-10-CM

## 2024-03-05 DIAGNOSIS — T76A1XA Adult financial abuse, suspected, initial encounter: Secondary | ICD-10-CM | POA: Diagnosis not present

## 2024-03-05 DIAGNOSIS — Z87440 Personal history of urinary (tract) infections: Secondary | ICD-10-CM

## 2024-03-05 DIAGNOSIS — M79605 Pain in left leg: Secondary | ICD-10-CM

## 2024-03-05 DIAGNOSIS — R410 Disorientation, unspecified: Secondary | ICD-10-CM

## 2024-03-05 DIAGNOSIS — R4182 Altered mental status, unspecified: Secondary | ICD-10-CM

## 2024-03-05 LAB — URINALYSIS, ROUTINE W REFLEX MICROSCOPIC
Bilirubin, UA: NEGATIVE
Glucose, UA: NEGATIVE
Ketones, UA: NEGATIVE
Nitrite, UA: NEGATIVE
Protein,UA: NEGATIVE
Specific Gravity, UA: 1.015 (ref 1.005–1.030)
Urobilinogen, Ur: 0.2 mg/dL (ref 0.2–1.0)
pH, UA: 6.5 (ref 5.0–7.5)

## 2024-03-05 LAB — CBC
Hematocrit: 33.6 % — ABNORMAL LOW (ref 34.0–46.6)
Hemoglobin: 10.4 g/dL — ABNORMAL LOW (ref 11.1–15.9)
MCH: 26.3 pg — ABNORMAL LOW (ref 26.6–33.0)
MCHC: 31 g/dL — ABNORMAL LOW (ref 31.5–35.7)
MCV: 85 fL (ref 79–97)
Platelets: 317 10*3/uL (ref 150–450)
RBC: 3.95 x10E6/uL (ref 3.77–5.28)
RDW: 13.9 % (ref 11.7–15.4)
WBC: 10.3 10*3/uL (ref 3.4–10.8)

## 2024-03-05 LAB — MICROSCOPIC EXAMINATION

## 2024-03-05 MED ORDER — PROMETHAZINE HCL 25 MG/ML IJ SOLN
25.0000 mg | Freq: Once | INTRAMUSCULAR | Status: AC
  Administered 2024-03-05: 25 mg via INTRAMUSCULAR

## 2024-03-05 MED ORDER — CEFTRIAXONE SODIUM 500 MG IJ SOLR
1000.0000 mg | Freq: Once | INTRAMUSCULAR | Status: AC
  Administered 2024-03-05: 1000 mg via INTRAMUSCULAR

## 2024-03-05 MED ORDER — PROMETHAZINE HCL 50 MG/ML IJ SOLN: 25.0000 mg | Freq: Once | INTRAMUSCULAR | Status: DC

## 2024-03-05 MED ORDER — ONDANSETRON 4 MG PO TBDP
4.0000 mg | ORAL_TABLET | Freq: Three times a day (TID) | ORAL | 0 refills | Status: DC | PRN
Start: 2024-03-05 — End: 2024-03-28

## 2024-03-05 MED ORDER — CEFTRIAXONE SODIUM 1 G IJ SOLR: 1.0000 g | Freq: Once | INTRAMUSCULAR | Status: DC

## 2024-03-05 NOTE — Progress Notes (Unsigned)
 BP 129/72 (BP Location: Left Arm, Patient Position: Sitting, Cuff Size: Normal)   Pulse 98   Temp 97.7 F (36.5 C) (Oral)   Ht 5' 2 (1.575 m)   Wt 147 lb 9.6 oz (67 kg)   LMP  (LMP Unknown)   SpO2 96%   BMI 27.00 kg/m    Subjective:    Patient ID: Renee Bridges, female    DOB: March 21, 1946, 78 y.o.   MRN: [id number]  HPI: Renee Bridges is a 78 y.o. female  Chief Complaint  Patient presents with   Leg Pain    Left leg, Pt states that it has gotten worse.    Abdominal Pain   Emesis    After each meal for the last few days.    Fell when she was bent over cleaning and fell  Has been thowing up  Didn't finish her antibiotics didn't take her gabapentin .    Relevant past medical, surgical, family and social history reviewed and updated as indicated. Interim medical history since our last visit reviewed. Allergies and medications reviewed and updated.  Review of Systems  Per HPI unless specifically indicated above     Objective:    BP 129/72 (BP Location: Left Arm, Patient Position: Sitting, Cuff Size: Normal)   Pulse 98   Temp 97.7 F (36.5 C) (Oral)   Ht 5' 2 (1.575 m)   Wt 147 lb 9.6 oz (67 kg)   LMP  (LMP Unknown)   SpO2 96%   BMI 27.00 kg/m   Wt Readings from Last 3 Encounters:  03/05/24 147 lb 9.6 oz (67 kg)  01/08/24 150 lb (68 kg)  01/04/24 150 lb 12.8 oz (68.4 kg)    Physical Exam  Results for orders placed or performed in visit on 01/04/24  Microscopic Examination   Collection Time: 01/04/24 11:29 AM   Urine  Result Value Ref Range   WBC, UA 0-5 0 - 5 /hpf   RBC, Urine 0-2 0 - 2 /hpf   Epithelial Cells (non renal) 0-10 0 - 10 /hpf   Bacteria, UA None seen None seen/Few  Urinalysis, Routine w reflex microscopic   Collection Time: 01/04/24 11:29 AM  Result Value Ref Range   Specific Gravity, UA <1.005 (L) 1.005 - 1.030   pH, UA 6.0 5.0 - 7.5   Color, UA Yellow Yellow   Appearance Ur Cloudy (A) Clear   Leukocytes,UA 1+ (A) Negative    Protein,UA Negative Negative/Trace   Glucose, UA Negative Negative   Ketones, UA Negative Negative   RBC, UA 1+ (A) Negative   Bilirubin, UA Negative Negative   Urobilinogen, Ur 0.2 0.2 - 1.0 mg/dL   Nitrite, UA Negative Negative   Microscopic Examination See below:   CBC with Differential/Platelet   Collection Time: 01/04/24 11:32 AM  Result Value Ref Range   WBC 10.4 3.4 - 10.8 x10E3/uL   RBC 4.37 3.77 - 5.28 x10E6/uL   Hemoglobin 12.3 11.1 - 15.9 g/dL   Hematocrit 60.1 65.9 - 46.6 %   MCV 91 79 - 97 fL   MCH 28.1 26.6 - 33.0 pg   MCHC 30.9 (L) 31.5 - 35.7 g/dL   RDW 87.6 88.2 - 84.5 %   Platelets 274 150 - 450 x10E3/uL   Neutrophils 59 Not Estab. %   Lymphs 30 Not Estab. %   Monocytes 9 Not Estab. %   Eos 1 Not Estab. %   Basos 1 Not Estab. %   Neutrophils Absolute 6.1  1.4 - 7.0 x10E3/uL   Lymphocytes Absolute 3.1 0.7 - 3.1 x10E3/uL   Monocytes Absolute 1.0 (H) 0.1 - 0.9 x10E3/uL   EOS (ABSOLUTE) 0.1 0.0 - 0.4 x10E3/uL   Basophils Absolute 0.1 0.0 - 0.2 x10E3/uL   Immature Granulocytes 0 Not Estab. %   Immature Grans (Abs) 0.0 0.0 - 0.1 x10E3/uL  Lipid Panel w/o Chol/HDL Ratio   Collection Time: 01/04/24 11:32 AM  Result Value Ref Range   Cholesterol, Total 194 100 - 199 mg/dL   Triglycerides 871 0 - 149 mg/dL   HDL 44 >60 mg/dL   VLDL Cholesterol Cal 23 5 - 40 mg/dL   LDL Chol Calc (NIH) 872 (H) 0 - 99 mg/dL  Comprehensive metabolic panel with GFR   Collection Time: 01/04/24 11:32 AM  Result Value Ref Range   Glucose 81 70 - 99 mg/dL   BUN 13 8 - 27 mg/dL   Creatinine, Ser 8.95 (H) 0.57 - 1.00 mg/dL   eGFR 55 (L) >40 fO/fpw/8.26   BUN/Creatinine Ratio 13 12 - 28   Sodium 139 134 - 144 mmol/L   Potassium 4.2 3.5 - 5.2 mmol/L   Chloride 101 96 - 106 mmol/L   CO2 21 20 - 29 mmol/L   Calcium  9.4 8.7 - 10.3 mg/dL   Total Protein 6.8 6.0 - 8.5 g/dL   Albumin 4.1 3.8 - 4.8 g/dL   Globulin, Total 2.7 1.5 - 4.5 g/dL   Bilirubin Total 0.4 0.0 - 1.2 mg/dL   Alkaline  Phosphatase 92 44 - 121 IU/L   AST 17 0 - 40 IU/L   ALT 11 0 - 32 IU/L  VITAMIN D  25 Hydroxy (Vit-D Deficiency, Fractures)   Collection Time: 01/04/24 11:32 AM  Result Value Ref Range   Vit D, 25-Hydroxy 24.9 (L) 30.0 - 100.0 ng/mL  Urine Culture   Collection Time: 01/04/24  1:35 PM   Specimen: Urine   UR  Result Value Ref Range   Urine Culture, Routine Final report    Organism ID, Bacteria Comment       Assessment & Plan:   Problem List Items Addressed This Visit   None    Follow up plan: No follow-ups on file.

## 2024-03-06 ENCOUNTER — Ambulatory Visit: Payer: Self-pay | Admitting: Family Medicine

## 2024-03-06 ENCOUNTER — Encounter: Payer: Self-pay | Admitting: Family Medicine

## 2024-03-06 ENCOUNTER — Telehealth: Payer: Self-pay

## 2024-03-06 LAB — COMPREHENSIVE METABOLIC PANEL WITH GFR
ALT: 9 IU/L (ref 0–32)
AST: 20 IU/L (ref 0–40)
Albumin: 4.1 g/dL (ref 3.8–4.8)
Alkaline Phosphatase: 83 IU/L (ref 44–121)
BUN/Creatinine Ratio: 11 — ABNORMAL LOW (ref 12–28)
BUN: 12 mg/dL (ref 8–27)
Bilirubin Total: 0.4 mg/dL (ref 0.0–1.2)
CO2: 20 mmol/L (ref 20–29)
Calcium: 9.2 mg/dL (ref 8.7–10.3)
Chloride: 99 mmol/L (ref 96–106)
Creatinine, Ser: 1.12 mg/dL — ABNORMAL HIGH (ref 0.57–1.00)
Globulin, Total: 2.4 g/dL (ref 1.5–4.5)
Glucose: 97 mg/dL (ref 70–99)
Potassium: 4.7 mmol/L (ref 3.5–5.2)
Sodium: 136 mmol/L (ref 134–144)
Total Protein: 6.5 g/dL (ref 6.0–8.5)
eGFR: 50 mL/min/{1.73_m2} — ABNORMAL LOW (ref >59)

## 2024-03-06 NOTE — Progress Notes (Signed)
 Complex Care Management Note  Care Guide Note 03/06/2024 Name: Renee Bridges MRN: [id number] DOB: [birth year]  Renee Bridges is a 78 y.o. year old female who sees Vicci Duwaine SQUIBB, DO for primary care. I reached out to Carole GORMAN Reap by phone today to offer complex care management services.  [name] was given information about Complex Care Management services today including:   The Complex Care Management services include support from the care team which includes your Nurse Care Manager, Clinical Social Worker, or Pharmacist.  The Complex Care Management team is here to help remove barriers to the health concerns and goals most important to you. Complex Care Management services are voluntary, and the patient may decline or stop services at any time by request to their care team member.   Complex Care Management Consent Status: Patient agreed to services and verbal consent obtained.   Follow up plan:  Telephone appointment with complex care management team member scheduled for:  03/13/2024  Encounter Outcome:  Patient Scheduled  Jeoffrey Buffalo , RMA     [facility], [facility] Guide  Direct Dial: [phone]  Website: [email or web address]

## 2024-03-07 LAB — URINE CULTURE

## 2024-03-09 DIAGNOSIS — N3001 Acute cystitis with hematuria: Secondary | ICD-10-CM | POA: Diagnosis not present

## 2024-03-09 DIAGNOSIS — E782 Mixed hyperlipidemia: Secondary | ICD-10-CM | POA: Diagnosis not present

## 2024-03-09 DIAGNOSIS — R112 Nausea with vomiting, unspecified: Secondary | ICD-10-CM | POA: Diagnosis not present

## 2024-03-09 DIAGNOSIS — Z9181 History of falling: Secondary | ICD-10-CM | POA: Diagnosis not present

## 2024-03-09 DIAGNOSIS — M79605 Pain in left leg: Secondary | ICD-10-CM | POA: Diagnosis not present

## 2024-03-11 DIAGNOSIS — M79605 Pain in left leg: Secondary | ICD-10-CM | POA: Diagnosis not present

## 2024-03-11 DIAGNOSIS — R112 Nausea with vomiting, unspecified: Secondary | ICD-10-CM | POA: Diagnosis not present

## 2024-03-11 DIAGNOSIS — N3001 Acute cystitis with hematuria: Secondary | ICD-10-CM | POA: Diagnosis not present

## 2024-03-11 DIAGNOSIS — Z9181 History of falling: Secondary | ICD-10-CM | POA: Diagnosis not present

## 2024-03-11 DIAGNOSIS — E782 Mixed hyperlipidemia: Secondary | ICD-10-CM | POA: Diagnosis not present

## 2024-03-12 DIAGNOSIS — R112 Nausea with vomiting, unspecified: Secondary | ICD-10-CM | POA: Diagnosis not present

## 2024-03-12 DIAGNOSIS — Z9181 History of falling: Secondary | ICD-10-CM | POA: Diagnosis not present

## 2024-03-12 DIAGNOSIS — M79605 Pain in left leg: Secondary | ICD-10-CM | POA: Diagnosis not present

## 2024-03-12 DIAGNOSIS — E782 Mixed hyperlipidemia: Secondary | ICD-10-CM | POA: Diagnosis not present

## 2024-03-12 DIAGNOSIS — N3001 Acute cystitis with hematuria: Secondary | ICD-10-CM | POA: Diagnosis not present

## 2024-03-13 ENCOUNTER — Other Ambulatory Visit: Payer: Self-pay | Admitting: Licensed Clinical Social Worker

## 2024-03-13 NOTE — Patient Instructions (Signed)
 Visit Information  Thank you for taking time to visit with me today. Please don't hesitate to contact me if I can be of assistance to you before our next scheduled appointment.  Your next care management appointment is no further scheduled appointments.    Closing From: Complex Care Management.  Please call the care guide team at [phone] if you need to cancel, schedule, or reschedule an appointment.   Please call 911 if you are experiencing a Mental Health or Behavioral Health Crisis or need someone to talk to.  Fletcher Humble MSW, LCSW Licensed Clinical Social Worker  [facility], Population Health Direct Dial: [phone]  Fax: [phone]

## 2024-03-13 NOTE — Patient Outreach (Signed)
 Complex Care Management   Visit Note  03/13/2024  Name:  Renee Bridges MRN: [id number] DOB: 1946-02-03  Situation: Referral received for Complex Care Management related to SDOH Barriers:  Stress I obtained verbal consent from Caregiver.  Visit completed with pt granddaughter  on the phone Pt granddaughter requested information regarding moving brothers out of patient home. Granddaughter expressed concerns regarding safety. Granddaughter encouraged to contact APS Background:   Past Medical History:  Diagnosis Date   Anxiety    Arthritis    Asthma    Complication of surgical procedure 03/08/2018   Constipation 07/17/2016   Depression    Headache    History of hiatal hernia    Hypertension    Immobility 09/25/2016   Kidney cyst, acquired    Seizures (HCC)    hx. as child   Senile purpura (HCC) 04/16/2022   Shortness of breath dyspnea    with excertion    Assessment: Patient Reported Symptoms:  Cognitive Cognitive Status: Unable to Assess Cognitive/Intellectual Conditions Management [RPT]: Not Assessed      Neurological Neurological Review of Symptoms: Not assessed    HEENT HEENT Symptoms Reported: Not assessed      Cardiovascular Cardiovascular Symptoms Reported: No symptoms reported    Respiratory Respiratory Symptoms Reported: Not assesed    Endocrine Endocrine Symptoms Reported: Not assessed    Gastrointestinal Gastrointestinal Symptoms Reported: Not assessed      Genitourinary Genitourinary Symptoms Reported: Not assessed    Integumentary Integumentary Symptoms Reported: Not assessed    Musculoskeletal Musculoskelatal Symptoms Reviewed: Not assessed        Psychosocial       Quality of Family Relationships: involved Do you feel physically threatened by others?:  (did not spk with patient to directly ask this question)      03/05/2024   10:58 AM  Depression screen PHQ 2/9  Decreased Interest 0  Down, Depressed, Hopeless 0  PHQ - 2 Score 0  Altered  sleeping 1  Tired, decreased energy 1  Change in appetite 1  Feeling bad or failure about yourself  0  Trouble concentrating 0  Moving slowly or fidgety/restless 1  PHQ-9 Score 4    There were no vitals filed for this visit.  Medications Reviewed Today   Medications were not reviewed in this encounter     Recommendation:   Contact local DSS APS  Follow Up Plan:   Closing From:  Complex Care Management  Alfonso Rummer MSW, LCSW Licensed Clinical Social Worker  [facility], Population Health Direct Dial: [phone]  Fax: [phone]

## 2024-03-14 ENCOUNTER — Telehealth: Payer: Self-pay

## 2024-03-14 NOTE — Telephone Encounter (Signed)
 Copied from CRM [id number]. Topic: Referral - Request for Referral >> Mar 14, 2024 11:18 AM Wess RAMAN wrote: Did the patient discuss referral with their provider in the last year? Yes (If No - schedule appointment) (If Yes - send message)  Appointment offered? No  Type of order/referral and detailed reason for visit: Gastroenterology  Preference of office, provider, location: No Preference   If referral order, have you been seen by this specialty before? No (If Yes, this issue or another issue? When? Where?  Can we respond through MyChart? Yes

## 2024-03-17 NOTE — Telephone Encounter (Signed)
 Forwarding to PCP.

## 2024-03-17 NOTE — Telephone Encounter (Signed)
 Is this for the diarrhea? What is the reason for the referral?

## 2024-03-20 ENCOUNTER — Ambulatory Visit (INDEPENDENT_AMBULATORY_CARE_PROVIDER_SITE_OTHER): Admitting: Family Medicine

## 2024-03-20 ENCOUNTER — Encounter: Payer: Self-pay | Admitting: Family Medicine

## 2024-03-20 ENCOUNTER — Ambulatory Visit
Admission: RE | Admit: 2024-03-20 | Discharge: 2024-03-20 | Disposition: A | Discharge status: Home / Self Care | Source: Ambulatory Visit | Attending: Family Medicine | Admitting: Family Medicine

## 2024-03-20 VITALS — BP 136/79 | HR 74 | Temp 97.8°F | Ht 62.0 in | Wt 143.4 lb

## 2024-03-20 DIAGNOSIS — R109 Unspecified abdominal pain: Secondary | ICD-10-CM

## 2024-03-20 DIAGNOSIS — R300 Dysuria: Secondary | ICD-10-CM

## 2024-03-20 DIAGNOSIS — K2900 Acute gastritis without bleeding: Secondary | ICD-10-CM | POA: Diagnosis not present

## 2024-03-20 DIAGNOSIS — R634 Abnormal weight loss: Secondary | ICD-10-CM

## 2024-03-20 DIAGNOSIS — R1013 Epigastric pain: Secondary | ICD-10-CM | POA: Diagnosis not present

## 2024-03-20 DIAGNOSIS — R935 Abnormal findings on diagnostic imaging of other abdominal regions, including retroperitoneum: Secondary | ICD-10-CM

## 2024-03-20 DIAGNOSIS — R8281 Pyuria: Secondary | ICD-10-CM

## 2024-03-20 DIAGNOSIS — N281 Cyst of kidney, acquired: Secondary | ICD-10-CM | POA: Diagnosis not present

## 2024-03-20 DIAGNOSIS — D649 Anemia, unspecified: Secondary | ICD-10-CM | POA: Diagnosis not present

## 2024-03-20 DIAGNOSIS — K5730 Diverticulosis of large intestine without perforation or abscess without bleeding: Secondary | ICD-10-CM | POA: Diagnosis not present

## 2024-03-20 LAB — URINALYSIS, ROUTINE W REFLEX MICROSCOPIC
Bilirubin, UA: NEGATIVE
Glucose, UA: NEGATIVE
Nitrite, UA: NEGATIVE
Specific Gravity, UA: 1.025 (ref 1.005–1.030)
Urobilinogen, Ur: 0.2 mg/dL (ref 0.2–1.0)
pH, UA: 6 (ref 5.0–7.5)

## 2024-03-20 LAB — MICROSCOPIC EXAMINATION: Bacteria, UA: NONE SEEN

## 2024-03-20 MED ORDER — IOHEXOL 300 MG/ML  SOLN
85.0000 mL | Freq: Once | INTRAMUSCULAR | Status: AC | PRN
  Administered 2024-03-20: 85 mL via INTRAVENOUS

## 2024-03-20 MED ORDER — OMEPRAZOLE 20 MG PO CPDR: 20.0000 mg | DELAYED_RELEASE_CAPSULE | Freq: Every day | ORAL | 3 refills | Status: DC

## 2024-03-20 NOTE — Telephone Encounter (Signed)
 Discussed at today's visit

## 2024-03-20 NOTE — Progress Notes (Signed)
 BP 136/79   Pulse 74   Temp 97.8 F (36.6 C) (Oral)   Ht 5' 2 (1.575 m)   Wt 143 lb 6.4 oz (65 kg)   LMP  (LMP Unknown)   SpO2 99%   BMI 26.23 kg/m    Subjective:    Patient ID: Renee Bridges, female    DOB: 08/21/1946, 78 y.o.   MRN: [id number]  HPI: Renee Bridges is a 78 y.o. female  Chief Complaint  Patient presents with   Urinary Tract Infection   Emesis    Patient states she has still been vomiting and not able to keep anything down.    Renee Bridges is feeling a bit better since her last visit, but she continues to vomit at least 1-2x every day and she's continued to lose weight.   ABDOMINAL PAIN  Duration:weeks Onset: gradual Severity: moderate Quality: aching, cramping Location:  generalized  Episode duration: hours Radiation: no Frequency: intermittent Status: stable Treatments attempted: heat and hot baths, antibiotics Fever: no Nausea: yes Vomiting: yes Weight loss: yes Decreased appetite: yes Diarrhea: no Constipation: no Blood in stool: no Heartburn: no Jaundice: no Rash: no Dysuria/urinary frequency: yes Hematuria: no History of sexually transmitted disease: no Recurrent NSAID use: no   Relevant past medical, surgical, family and social history reviewed and updated as indicated. Interim medical history since our last visit reviewed. Allergies and medications reviewed and updated.  Review of Systems  Constitutional: Negative.   Respiratory: Negative.    Cardiovascular: Negative.   Gastrointestinal:  Positive for abdominal pain, nausea and vomiting. Negative for abdominal distention, anal bleeding, blood in stool, constipation, diarrhea and rectal pain.  Musculoskeletal: Negative.   Psychiatric/Behavioral: Negative.      Per HPI unless specifically indicated above     Objective:    BP 136/79   Pulse 74   Temp 97.8 F (36.6 C) (Oral)   Ht 5' 2 (1.575 m)   Wt 143 lb 6.4 oz (65 kg)   LMP  (LMP Unknown)   SpO2 99%   BMI 26.23 kg/m    Wt Readings from Last 3 Encounters:  03/20/24 143 lb 6.4 oz (65 kg)  03/05/24 147 lb 9.6 oz (67 kg)  01/08/24 150 lb (68 kg)    Physical Exam Vitals and nursing note reviewed.  Constitutional:      General: She is not in acute distress.    Appearance: Normal appearance. She is not ill-appearing, toxic-appearing or diaphoretic.  HENT:     Head: Normocephalic and atraumatic.     Right Ear: External ear normal.     Left Ear: External ear normal.     Nose: Nose normal.     Mouth/Throat:     Mouth: Mucous membranes are moist.     Pharynx: Oropharynx is clear.  Eyes:     General: No scleral icterus.       Right eye: No discharge.        Left eye: No discharge.     Extraocular Movements: Extraocular movements intact.     Conjunctiva/sclera: Conjunctivae normal.     Pupils: Pupils are equal, round, and reactive to light.  Cardiovascular:     Rate and Rhythm: Normal rate and regular rhythm.     Pulses: Normal pulses.     Heart sounds: Normal heart sounds. No murmur heard.    No friction rub. No gallop.  Pulmonary:     Effort: Pulmonary effort is normal. No respiratory distress.  Breath sounds: Normal breath sounds. No stridor. No wheezing, rhonchi or rales.  Chest:     Chest wall: No tenderness.  Abdominal:     General: Abdomen is flat. Bowel sounds are normal. There is no distension.     Palpations: Abdomen is soft. There is no mass.     Tenderness: There is no abdominal tenderness. There is no right CVA tenderness, left CVA tenderness, guarding or rebound.     Hernia: No hernia is present.  Musculoskeletal:        General: Normal range of motion.     Cervical back: Normal range of motion and neck supple.  Skin:    General: Skin is warm and dry.     Capillary Refill: Capillary refill takes less than 2 seconds.     Coloration: Skin is not jaundiced or pale.     Findings: No bruising, erythema, lesion or rash.  Neurological:     General: No focal deficit present.      Mental Status: She is alert and oriented to person, place, and time. Mental status is at baseline.  Psychiatric:        Mood and Affect: Mood normal.        Behavior: Behavior normal.        Thought Content: Thought content normal.        Judgment: Judgment normal.     Results for orders placed or performed in visit on 03/05/24  Urine Culture   Collection Time: 03/05/24 11:30 AM   Specimen: Urine   UR  Result Value Ref Range   Urine Culture, Routine Final report (A)    Organism ID, Bacteria Comment (A)   Microscopic Examination   Collection Time: 03/05/24 11:30 AM   Urine  Result Value Ref Range   WBC, UA 11-30 (A) 0 - 5 /hpf   RBC, Urine 0-2 0 - 2 /hpf   Epithelial Cells (non renal) 0-10 0 - 10 /hpf   Mucus, UA Present (A) Not Estab.   Bacteria, UA Moderate (A) None seen/Few  Urinalysis, Routine w reflex microscopic   Collection Time: 03/05/24 11:30 AM  Result Value Ref Range   Specific Gravity, UA 1.015 1.005 - 1.030   pH, UA 6.5 5.0 - 7.5   Color, UA Yellow Yellow   Appearance Ur Cloudy (A) Clear   Leukocytes,UA 3+ (A) Negative   Protein,UA Negative Negative/Trace   Glucose, UA Negative Negative   Ketones, UA Negative Negative   RBC, UA 1+ (A) Negative   Bilirubin, UA Negative Negative   Urobilinogen, Ur 0.2 0.2 - 1.0 mg/dL   Nitrite, UA Negative Negative   Microscopic Examination See below:   CBC   Collection Time: 03/05/24 11:37 AM  Result Value Ref Range   WBC 10.3 3.4 - 10.8 x10E3/uL   RBC 3.95 3.77 - 5.28 x10E6/uL   Hemoglobin 10.4 (L) 11.1 - 15.9 g/dL   Hematocrit 66.3 (L) 65.9 - 46.6 %   MCV 85 79 - 97 fL   MCH 26.3 (L) 26.6 - 33.0 pg   MCHC 31.0 (L) 31.5 - 35.7 g/dL   RDW 86.0 88.2 - 84.5 %   Platelets 317 150 - 450 x10E3/uL  Comprehensive metabolic panel with GFR   Collection Time: 03/05/24 11:53 AM  Result Value Ref Range   Glucose 97 70 - 99 mg/dL   BUN 12 8 - 27 mg/dL   Creatinine, Ser 8.87 (H) 0.57 - 1.00 mg/dL   eGFR 50 (L) >40 fO/fpw/8.26  BUN/Creatinine Ratio 11 (L) 12 - 28   Sodium 136 134 - 144 mmol/L   Potassium 4.7 3.5 - 5.2 mmol/L   Chloride 99 96 - 106 mmol/L   CO2 20 20 - 29 mmol/L   Calcium  9.2 8.7 - 10.3 mg/dL   Total Protein 6.5 6.0 - 8.5 g/dL   Albumin 4.1 3.8 - 4.8 g/dL   Globulin, Total 2.4 1.5 - 4.5 g/dL   Bilirubin Total 0.4 0.0 - 1.2 mg/dL   Alkaline Phosphatase 83 44 - 121 IU/L   AST 20 0 - 40 IU/L   ALT 9 0 - 32 IU/L      Assessment & Plan:   Problem List Items Addressed This Visit   None Visit Diagnoses       Abdominal pain, unspecified abdominal location    -  Primary   Will check CT abdomen given weight loss, anemia, vomiting and pain. Await results. May be gastritis- will start omeprazole  if normal.   Relevant Orders   CT ABDOMEN PELVIS W CONTRAST     Weight loss       See discussion under abdominal pain.   Relevant Orders   Ambulatory referral to Gastroenterology   CT ABDOMEN PELVIS W CONTRAST     Burning with urination       + Leuks   Relevant Orders   Urinalysis, Routine w reflex microscopic     Acute gastritis, presence of bleeding unspecified, unspecified gastritis type       Will check CT and start omeprazole .   Relevant Orders   Ambulatory referral to Gastroenterology     Anemia, unspecified type       Rechecking labs today with iron studies. Await results. Treat as needed.   Relevant Orders   CBC with Differential/Platelet   Basic metabolic panel with GFR   Ferritin   Iron Binding Cap (TIBC)([facility])   B12   Ambulatory referral to Gastroenterology   CT ABDOMEN PELVIS W CONTRAST     Pyuria       Await culture.   Relevant Orders   Urine Culture        Follow up plan: Return in about 4 weeks (around 04/17/2024) for OK to double book or use same day.

## 2024-03-21 ENCOUNTER — Ambulatory Visit: Payer: Self-pay | Admitting: Family Medicine

## 2024-03-21 ENCOUNTER — Other Ambulatory Visit: Payer: Self-pay | Admitting: Family Medicine

## 2024-03-21 DIAGNOSIS — M79605 Pain in left leg: Secondary | ICD-10-CM | POA: Diagnosis not present

## 2024-03-21 DIAGNOSIS — Z9181 History of falling: Secondary | ICD-10-CM | POA: Diagnosis not present

## 2024-03-21 DIAGNOSIS — D500 Iron deficiency anemia secondary to blood loss (chronic): Secondary | ICD-10-CM

## 2024-03-21 DIAGNOSIS — E782 Mixed hyperlipidemia: Secondary | ICD-10-CM | POA: Diagnosis not present

## 2024-03-21 DIAGNOSIS — R112 Nausea with vomiting, unspecified: Secondary | ICD-10-CM | POA: Diagnosis not present

## 2024-03-21 DIAGNOSIS — R935 Abnormal findings on diagnostic imaging of other abdominal regions, including retroperitoneum: Secondary | ICD-10-CM

## 2024-03-21 DIAGNOSIS — N3001 Acute cystitis with hematuria: Secondary | ICD-10-CM | POA: Diagnosis not present

## 2024-03-21 LAB — CBC WITH DIFFERENTIAL/PLATELET
Basophils Absolute: 0 x10E3/uL (ref 0.0–0.2)
Basos: 0 %
EOS (ABSOLUTE): 0.1 x10E3/uL (ref 0.0–0.4)
Eos: 1 %
Hematocrit: 33.2 % — ABNORMAL LOW (ref 34.0–46.6)
Hemoglobin: 9.9 g/dL — ABNORMAL LOW (ref 11.1–15.9)
Immature Grans (Abs): 0 x10E3/uL (ref 0.0–0.1)
Immature Granulocytes: 0 %
Lymphocytes Absolute: 2.7 x10E3/uL (ref 0.7–3.1)
Lymphs: 29 %
MCH: 25.5 pg — ABNORMAL LOW (ref 26.6–33.0)
MCHC: 29.8 g/dL — ABNORMAL LOW (ref 31.5–35.7)
MCV: 86 fL (ref 79–97)
Monocytes Absolute: 0.8 x10E3/uL (ref 0.1–0.9)
Monocytes: 9 %
Neutrophils Absolute: 5.7 x10E3/uL (ref 1.4–7.0)
Neutrophils: 61 %
Platelets: 298 x10E3/uL (ref 150–450)
RBC: 3.88 x10E6/uL (ref 3.77–5.28)
RDW: 12.7 % (ref 11.7–15.4)
WBC: 9.3 x10E3/uL (ref 3.4–10.8)

## 2024-03-21 LAB — BASIC METABOLIC PANEL WITH GFR
BUN/Creatinine Ratio: 14 (ref 12–28)
BUN: 15 mg/dL (ref 8–27)
CO2: 20 mmol/L (ref 20–29)
Calcium: 9.1 mg/dL (ref 8.7–10.3)
Chloride: 102 mmol/L (ref 96–106)
Creatinine, Ser: 1.05 mg/dL — ABNORMAL HIGH (ref 0.57–1.00)
Glucose: 94 mg/dL (ref 70–99)
Potassium: 4.8 mmol/L (ref 3.5–5.2)
Sodium: 136 mmol/L (ref 134–144)
eGFR: 54 mL/min/1.73 — ABNORMAL LOW (ref >59)

## 2024-03-21 LAB — IRON AND TIBC
Iron Saturation: 5 % — CL (ref 15–55)
Iron: 17 ug/dL — ABNORMAL LOW (ref 27–139)
Total Iron Binding Capacity: 358 ug/dL (ref 250–450)
UIBC: 341 ug/dL (ref 118–369)

## 2024-03-21 LAB — VITAMIN B12: Vitamin B-12: 234 pg/mL (ref 232–1245)

## 2024-03-21 LAB — FERRITIN: Ferritin: 6 ng/mL — ABNORMAL LOW (ref 15–150)

## 2024-03-21 NOTE — Addendum Note (Signed)
 Addended by: VICCI DUWAINE SQUIBB on: 03/21/2024 12:33 PM   Modules accepted: Orders

## 2024-03-22 LAB — URINE CULTURE: Organism ID, Bacteria: NO GROWTH

## 2024-03-24 ENCOUNTER — Telehealth: Payer: Self-pay

## 2024-03-24 NOTE — Telephone Encounter (Signed)
 GI referral assist requested by PCP. Patient needs to be seen much sooner than current appointment has been scheduled.  Referral has been sent as URGENT to [facility] and [facility].   Referral should also be sent to [facility] for [facility] as well.

## 2024-03-25 ENCOUNTER — Telehealth: Payer: Self-pay | Admitting: Family Medicine

## 2024-03-25 NOTE — Telephone Encounter (Unsigned)
 Copied from CRM [id number]. Topic: Referral - Status >> Mar 25, 2024  9:13 AM Essie A wrote: Reason for CRM: Renee Bridges called to let you know that the referral for gastro was sent to them instead of where the patient is actually scheduled at.  The referral should have been sent to [facility] which is on the referral.  Please resend to correct office.  Thanks.

## 2024-03-26 DIAGNOSIS — Z9181 History of falling: Secondary | ICD-10-CM | POA: Diagnosis not present

## 2024-03-26 DIAGNOSIS — N3001 Acute cystitis with hematuria: Secondary | ICD-10-CM | POA: Diagnosis not present

## 2024-03-26 DIAGNOSIS — E782 Mixed hyperlipidemia: Secondary | ICD-10-CM | POA: Diagnosis not present

## 2024-03-26 DIAGNOSIS — R112 Nausea with vomiting, unspecified: Secondary | ICD-10-CM | POA: Diagnosis not present

## 2024-03-26 DIAGNOSIS — M79605 Pain in left leg: Secondary | ICD-10-CM | POA: Diagnosis not present

## 2024-03-27 ENCOUNTER — Encounter: Payer: Self-pay | Admitting: Emergency Medicine

## 2024-03-27 ENCOUNTER — Other Ambulatory Visit: Payer: Self-pay

## 2024-03-27 ENCOUNTER — Observation Stay
Admission: EM | Admit: 2024-03-27 | Discharge: 2024-03-28 | Disposition: A | Discharge status: Home / Self Care | Attending: Gastroenterology | Admitting: Gastroenterology

## 2024-03-27 ENCOUNTER — Ambulatory Visit: Admitting: General Surgery

## 2024-03-27 DIAGNOSIS — I10 Essential (primary) hypertension: Secondary | ICD-10-CM | POA: Diagnosis not present

## 2024-03-27 DIAGNOSIS — R1013 Epigastric pain: Secondary | ICD-10-CM | POA: Diagnosis not present

## 2024-03-27 DIAGNOSIS — R933 Abnormal findings on diagnostic imaging of other parts of digestive tract: Secondary | ICD-10-CM

## 2024-03-27 DIAGNOSIS — F17220 Nicotine dependence, chewing tobacco, uncomplicated: Secondary | ICD-10-CM | POA: Insufficient documentation

## 2024-03-27 DIAGNOSIS — J45909 Unspecified asthma, uncomplicated: Secondary | ICD-10-CM | POA: Diagnosis not present

## 2024-03-27 DIAGNOSIS — R252 Cramp and spasm: Secondary | ICD-10-CM | POA: Diagnosis not present

## 2024-03-27 DIAGNOSIS — D508 Other iron deficiency anemias: Secondary | ICD-10-CM | POA: Diagnosis not present

## 2024-03-27 DIAGNOSIS — R109 Unspecified abdominal pain: Secondary | ICD-10-CM | POA: Diagnosis present

## 2024-03-27 DIAGNOSIS — R112 Nausea with vomiting, unspecified: Secondary | ICD-10-CM | POA: Diagnosis present

## 2024-03-27 DIAGNOSIS — K921 Melena: Secondary | ICD-10-CM | POA: Diagnosis not present

## 2024-03-27 LAB — COMPREHENSIVE METABOLIC PANEL WITH GFR
ALT: 12 U/L (ref 0–44)
AST: 18 U/L (ref 15–41)
Albumin: 3.3 g/dL — ABNORMAL LOW (ref 3.5–5.0)
Alkaline Phosphatase: 59 U/L (ref 38–126)
Anion gap: 11 (ref 5–15)
BUN: 11 mg/dL (ref 8–23)
CO2: 23 mmol/L (ref 22–32)
Calcium: 9.1 mg/dL (ref 8.9–10.3)
Chloride: 105 mmol/L (ref 98–111)
Creatinine, Ser: 1.06 mg/dL — ABNORMAL HIGH (ref 0.44–1.00)
GFR, Estimated: 53 mL/min — ABNORMAL LOW (ref >60)
Glucose, Bld: 118 mg/dL — ABNORMAL HIGH (ref 70–99)
Potassium: 4.3 mmol/L (ref 3.5–5.1)
Sodium: 139 mmol/L (ref 135–145)
Total Bilirubin: 0.7 mg/dL (ref 0.0–1.2)
Total Protein: 6.6 g/dL (ref 6.5–8.1)

## 2024-03-27 LAB — CBC
HCT: 32.7 % — ABNORMAL LOW (ref 36.0–46.0)
Hemoglobin: 9.7 g/dL — ABNORMAL LOW (ref 12.0–15.0)
MCH: 25 pg — ABNORMAL LOW (ref 26.0–34.0)
MCHC: 29.7 g/dL — ABNORMAL LOW (ref 30.0–36.0)
MCV: 84.3 fL (ref 80.0–100.0)
Platelets: 303 K/uL (ref 150–400)
RBC: 3.88 MIL/uL (ref 3.87–5.11)
RDW: 14.2 % (ref 11.5–15.5)
WBC: 9 K/uL (ref 4.0–10.5)
nRBC: 0 % (ref 0.0–0.2)

## 2024-03-27 LAB — URINALYSIS, ROUTINE W REFLEX MICROSCOPIC
Bilirubin Urine: NEGATIVE
Glucose, UA: NEGATIVE mg/dL
Ketones, ur: NEGATIVE mg/dL
Leukocytes,Ua: NEGATIVE
Nitrite: NEGATIVE
Protein, ur: NEGATIVE mg/dL
Specific Gravity, Urine: 1.003 — ABNORMAL LOW (ref 1.005–1.030)
pH: 6 (ref 5.0–8.0)

## 2024-03-27 LAB — LIPASE, BLOOD: Lipase: 44 U/L (ref 11–51)

## 2024-03-27 MED ORDER — ALUM & MAG HYDROXIDE-SIMETH 200-200-20 MG/5ML PO SUSP
30.0000 mL | Freq: Once | ORAL | Status: AC
  Administered 2024-03-27: 30 mL via ORAL
  Filled 2024-03-27: qty 30

## 2024-03-27 MED ORDER — MAGNESIUM OXIDE -MG SUPPLEMENT 400 (240 MG) MG PO TABS
400.0000 mg | ORAL_TABLET | Freq: Every day | ORAL | Status: DC
  Administered 2024-03-27: 400 mg via ORAL
  Filled 2024-03-27: qty 1

## 2024-03-27 MED ORDER — ONDANSETRON HCL 4 MG/2ML IJ SOLN
4.0000 mg | Freq: Once | INTRAMUSCULAR | Status: AC
  Administered 2024-03-27: 4 mg via INTRAVENOUS
  Filled 2024-03-27: qty 2

## 2024-03-27 MED ORDER — SODIUM CHLORIDE 0.9 % IV BOLUS
500.0000 mL | Freq: Once | INTRAVENOUS | Status: AC
  Administered 2024-03-27: 500 mL via INTRAVENOUS

## 2024-03-27 MED ORDER — BISACODYL 5 MG PO TBEC: 5.0000 mg | DELAYED_RELEASE_TABLET | Freq: Every day | ORAL | Status: DC | PRN

## 2024-03-27 MED ORDER — LABETALOL HCL 5 MG/ML IV SOLN: 5.0000 mg | INTRAVENOUS | Status: DC | PRN

## 2024-03-27 MED ORDER — ACETAMINOPHEN 325 MG PO TABS: 650.0000 mg | ORAL_TABLET | Freq: Four times a day (QID) | ORAL | Status: DC | PRN

## 2024-03-27 MED ORDER — METHOCARBAMOL 500 MG PO TABS: 500.0000 mg | ORAL_TABLET | Freq: Three times a day (TID) | ORAL | Status: DC | PRN

## 2024-03-27 MED ORDER — SODIUM CHLORIDE 0.9 % IV SOLN: INTRAVENOUS | Status: DC

## 2024-03-27 MED ORDER — LIDOCAINE VISCOUS HCL 2 % MT SOLN
15.0000 mL | Freq: Once | OROMUCOSAL | Status: AC
  Administered 2024-03-27: 15 mL via ORAL
  Filled 2024-03-27: qty 15

## 2024-03-27 MED ORDER — SENNOSIDES-DOCUSATE SODIUM 8.6-50 MG PO TABS
1.0000 | ORAL_TABLET | Freq: Every evening | ORAL | Status: DC | PRN
Start: 2024-03-27 — End: 2024-03-28

## 2024-03-27 MED ORDER — ACETAMINOPHEN 650 MG RE SUPP: 650.0000 mg | Freq: Four times a day (QID) | RECTAL | Status: DC | PRN

## 2024-03-27 MED ORDER — ENOXAPARIN SODIUM 40 MG/0.4ML IJ SOSY: 40.0000 mg | PREFILLED_SYRINGE | INTRAMUSCULAR | Status: DC

## 2024-03-27 MED ORDER — PROMETHAZINE HCL 25 MG PO TABS
12.5000 mg | ORAL_TABLET | Freq: Four times a day (QID) | ORAL | Status: DC | PRN
Start: 2024-03-27 — End: 2024-03-28
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
## 2024-03-27 NOTE — H&P (Signed)
 History and Physical    Patient: Renee Bridges FMW:[id number] DOB: 1946-08-16 DOA: 03/27/2024 DOS: the patient was seen and examined on 03/27/2024 PCP: Vicci Duwaine SQUIBB, DO  Patient coming from: Home  Chief Complaint:  Chief Complaint  Patient presents with   Abdominal Pain   HPI: Renee Bridges is a 78 y.o. female with medical history significant of anxiety, hypertension, constipation who presents to the ED today for evaluation of abdominal pain and nausea vomiting.  Patient was seen by GI as outpatient for follow-up today and was referred to the ED given persistence of her symptoms including declining hemoglobin.  She had a CT scan last week showing possible gastric ulceration without other significant acute findings.  Patient reports symptoms over the past several weeks to month.  She has intermittent mid/upper abdominal pain, nausea vomiting leading to poor p.tolerance for o. intake.  Patient reports intermittent associated muscle spasms and worsening generalized weakness since onset of symptoms.  She reports some lower abdominal discomfort with voiding but no frank dysuria or hematuria.  Patient denies fevers chills, cough, sore throat, congestion, shortness of breath, chest pain, unilateral weakness numbness tingling, headaches or other recent illnesses or symptoms.  ED course Temp 98.1 F, HR 80-1 01, RR 17, BP 155/80, SpO2 100% on room air. Labs were obtained including CMP and CBC which were notable for nonfasting glucose of 118, creatinine 1.06 stable from prior, albumin 3.3.  LFTs and lipase were normal.  CBC showed hemoglobin 9.7 down slightly from recent 9.9, 10.4 earlier this month, no leukocytosis.  UA showed rare bacteria but negative leukocytes, negative nitrites, 0-5 WBCs -not consistent with UTI.  CT abdomen pelvis from 03/20/2024 reviewed -showed no acute abdominopelvic findings.  Mild mural prominence of the cecum felt related to underdistention, apparent outpouching arising from  the superior gastric body felt to represent diverticulum or gastric ulceration, subcutaneous fat density in the left anterior abdomen at the level of the umbilicus (differential atypical lipomatous tumor, sequelae of fat necrosis or other fat-containing tumor, surgical consultation was recommended), increased size of simple right renal cyst 8.5 cm x 7.6 cm, previously 6.9 x 5.6 cm  ED treatment: GI cocktail, IV Zofran , 500 cc bolus normal saline.  Patient's outpatient gastroenterologist, Dr. Unk was contacted by EDP, recommended admission for further evaluation including EGD tomorrow given patient's persistent symptoms and downtrending hemoglobin.  Patient being admitted to med surge floor for observation.     Review of Systems: As mentioned in the history of present illness. All other systems reviewed and are negative.   Past Medical History:  Diagnosis Date   Anxiety    Arthritis    Asthma    Complication of surgical procedure 03/08/2018   Constipation 07/17/2016   Depression    Headache    History of hiatal hernia    Hypertension    Immobility 09/25/2016   Kidney cyst, acquired    Seizures (HCC)    hx. as child   Senile purpura (HCC) 04/16/2022   Shortness of breath dyspnea    with excertion   Past Surgical History:  Procedure Laterality Date   ABDOMINAL HYSTERECTOMY     BACK SURGERY     colonoscopy a year ago     LEG SURGERY Left 03/2015   mass removed from back - 20 years ago     SPINAL CORD STIMULATOR INSERTION N/A 01/17/2017   Procedure: LUMBAR SPINAL CORD STIMULATOR INSERTION;  Surgeon: Burnetta Aures, MD;  Location: [facility] OR;  Service: Orthopedics;  Laterality: N/A;  Requests 3 hrs   TONSILLECTOMY     TOTAL KNEE REVISION Left 05/13/2015   Procedure: REVISION LEFT  KNEE ARTHROPLASTY, REVISION PATELLA POLY EXCHANGE;  Surgeon: Redell Shoals, MD;  Location: [facility] ORS;  Service: Orthopedics;  Laterality: Left;   Social History:  reports that she has never smoked. She has been  exposed to tobacco smoke. She has never used smokeless tobacco. She reports that she does not drink alcohol  and does not use drugs.  Allergies  Allergen Reactions   Bactrim  [Sulfamethoxazole -Trimethoprim ] Other (See Comments)    Sweating, feeling like throat is closing up   Pollen Extract    Codeine Rash    Family History  Problem Relation Age of Onset   Cancer Mother    Cancer Father    Diabetes Brother    Cancer Brother    Cancer Brother     Prior to Admission medications   Medication Sig Start Date End Date Taking? Authorizing Provider  gabapentin  (NEURONTIN ) 100 MG capsule Take 1 capsule (100 mg total) by mouth at bedtime. 01/04/24   Johnson, Megan P, DO  levocetirizine (XYZAL ) 5 MG tablet Take 1 tablet (5 mg total) by mouth every evening. 01/04/24   Johnson, Megan P, DO  omeprazole  (PRILOSEC) 20 MG capsule Take 1 capsule (20 mg total) by mouth daily. 03/20/24   Johnson, Megan P, DO  ondansetron  (ZOFRAN -ODT) 4 MG disintegrating tablet Take 1 tablet (4 mg total) by mouth every 8 (eight) hours as needed for nausea or vomiting. 03/05/24   Johnson, Megan P, DO  Vitamin D , Ergocalciferol , (DRISDOL ) 1.25 MG (50000 UNIT) CAPS capsule Take 1 capsule (50,000 Units total) by mouth every 7 (seven) days. Patient not taking: Reported on 03/20/2024 01/04/24   Vicci Duwaine SQUIBB, DO    Physical Exam: Vitals:   03/27/24 1140 03/27/24 1613  BP: (!) 155/80 (!) 147/90  Pulse: 80 (!) 101  Resp: 17 17  Temp: 98.1 F (36.7 C)   TempSrc: Oral   SpO2: 100% 100%  Weight: 64.9 kg   Height: 5' 3 (1.6 m)    General exam: awake, alert, no acute distress HEENT: atraumatic, clear conjunctiva, anicteric sclera, moist mucus membranes, hearing grossly normal  Respiratory system: CTA, no wheezes, rales or rhonchi, normal respiratory effort. Cardiovascular system: normal S1/S2, RRR, no JVD, murmurs, rubs, gallops, no pedal edema.   Gastrointestinal system: soft, mild epigastric tenderness with guarding no  rebound, nondistended, bowel sounds present Central nervous system: A&O x 3. no gross focal neurologic deficits, normal speech Extremities: moves all, no edema, normal tone Skin: dry, intact, normal temperature, normal color, No rashes, lesions or ulcers seen on visualized skin Psychiatry: normal mood, congruent affect, judgement and insight appear normal  Data Reviewed:  As reviewed above  Assessment and Plan:  Abdominal pain, nausea and vomiting Persistent symptoms over several weeks to 1 month.  Nonacute CT abdomen pelvis last week but possible findings that could be consistent with gastric ulceration. -- Admit for observation -- GI consulted for EGD tomorrow -- Clear liquid diet, n.p.o. after 5 AM -- Continue maintenance IV fluids -- IV Zofran  and/or Phenergan  as needed for nausea -- Repeat BMP, CBC in a.m.  Leg cramps suspect due to mild dehydration from above-, poor p.o. intake -- Robaxin  as needed -- Magnesium  at bedtime --IV hydration as above  Hypertension -- IV labetalol  as needed for now, hold oral medications until after EGD    Advance Care Planning: CODE STATUS-full code  Consults: Gastroenterology --secure  chat with Dr. Unk who stated she will reach out to Dr. Jinny regarding EGD for tomorrow  Family Communication: None present at bedside, patient updated in detail  Severity of Illness: The appropriate patient status for this patient is OBSERVATION. Observation status is judged to be reasonable and necessary in order to provide the required intensity of service to ensure the patient's safety. The patient's presenting symptoms, physical exam findings, and initial radiographic and laboratory data in the context of their medical condition is felt to place them at decreased risk for further clinical deterioration. Furthermore, it is anticipated that the patient will be medically stable for discharge from the hospital within 2 midnights of admission.   Author: Burnard DELENA Cunning, DO 03/27/2024 5:05 PM  For on call review [email or web address].

## 2024-03-27 NOTE — ED Triage Notes (Signed)
 Patient to ED via POV from [facility] for abd pain with N/V/D. Ongoing for a couple of months. Worse this past week. Had Ct scan last week. Per family, sent to ED for concern of ulcer.

## 2024-03-28 ENCOUNTER — Encounter: Payer: Self-pay | Admitting: Internal Medicine

## 2024-03-28 ENCOUNTER — Observation Stay: Admitting: Anesthesiology

## 2024-03-28 ENCOUNTER — Encounter
Admission: EM | Disposition: A | Discharge status: Home / Self Care | Payer: Self-pay | Source: Home / Self Care | Attending: Emergency Medicine

## 2024-03-28 DIAGNOSIS — R1110 Vomiting, unspecified: Secondary | ICD-10-CM

## 2024-03-28 DIAGNOSIS — R112 Nausea with vomiting, unspecified: Secondary | ICD-10-CM | POA: Diagnosis not present

## 2024-03-28 DIAGNOSIS — R1907 Generalized intra-abdominal and pelvic swelling, mass and lump: Secondary | ICD-10-CM | POA: Diagnosis not present

## 2024-03-28 DIAGNOSIS — R1013 Epigastric pain: Secondary | ICD-10-CM | POA: Diagnosis not present

## 2024-03-28 DIAGNOSIS — K449 Diaphragmatic hernia without obstruction or gangrene: Secondary | ICD-10-CM | POA: Diagnosis not present

## 2024-03-28 DIAGNOSIS — R197 Diarrhea, unspecified: Secondary | ICD-10-CM

## 2024-03-28 DIAGNOSIS — R933 Abnormal findings on diagnostic imaging of other parts of digestive tract: Secondary | ICD-10-CM | POA: Diagnosis not present

## 2024-03-28 DIAGNOSIS — K2950 Unspecified chronic gastritis without bleeding: Secondary | ICD-10-CM

## 2024-03-28 DIAGNOSIS — R059 Cough, unspecified: Secondary | ICD-10-CM | POA: Diagnosis not present

## 2024-03-28 DIAGNOSIS — R109 Unspecified abdominal pain: Secondary | ICD-10-CM | POA: Diagnosis not present

## 2024-03-28 DIAGNOSIS — K922 Gastrointestinal hemorrhage, unspecified: Secondary | ICD-10-CM | POA: Diagnosis not present

## 2024-03-28 DIAGNOSIS — I10 Essential (primary) hypertension: Secondary | ICD-10-CM | POA: Diagnosis not present

## 2024-03-28 DIAGNOSIS — K2951 Unspecified chronic gastritis with bleeding: Secondary | ICD-10-CM | POA: Diagnosis not present

## 2024-03-28 DIAGNOSIS — D649 Anemia, unspecified: Secondary | ICD-10-CM | POA: Diagnosis not present

## 2024-03-28 HISTORY — PX: ESOPHAGOGASTRODUODENOSCOPY: SHX5428

## 2024-03-28 LAB — BASIC METABOLIC PANEL WITH GFR
Anion gap: 9 (ref 5–15)
BUN: 9 mg/dL (ref 8–23)
CO2: 24 mmol/L (ref 22–32)
Calcium: 8.4 mg/dL — ABNORMAL LOW (ref 8.9–10.3)
Chloride: 110 mmol/L (ref 98–111)
Creatinine, Ser: 1 mg/dL (ref 0.44–1.00)
GFR, Estimated: 57 mL/min — ABNORMAL LOW (ref >60)
Glucose, Bld: 98 mg/dL (ref 70–99)
Potassium: 4 mmol/L (ref 3.5–5.1)
Sodium: 143 mmol/L (ref 135–145)

## 2024-03-28 SURGERY — EGD (ESOPHAGOGASTRODUODENOSCOPY)
Anesthesia: General

## 2024-03-28 MED ORDER — ONDANSETRON 4 MG PO TBDP: 4.0000 mg | ORAL_TABLET | Freq: Three times a day (TID) | ORAL | 0 refills | Status: AC | PRN

## 2024-03-28 MED ORDER — SODIUM CHLORIDE 0.9 % IV SOLN: INTRAVENOUS | Status: DC

## 2024-03-28 MED ORDER — PHENYLEPHRINE 80 MCG/ML (10ML) SYRINGE FOR IV PUSH (FOR BLOOD PRESSURE SUPPORT)
PREFILLED_SYRINGE | INTRAVENOUS | Status: DC | PRN
  Administered 2024-03-28: 80 ug via INTRAVENOUS

## 2024-03-28 MED ORDER — LIDOCAINE HCL (PF) 2 % IJ SOLN
INTRAMUSCULAR | Status: DC | PRN
  Administered 2024-03-28: 100 mg via INTRADERMAL

## 2024-03-28 MED ORDER — PROPOFOL 10 MG/ML IV BOLUS
INTRAVENOUS | Status: DC | PRN
  Administered 2024-03-28 (×2): 10 mg via INTRAVENOUS
  Administered 2024-03-28: 80 mg via INTRAVENOUS

## 2024-03-28 MED ORDER — METHOCARBAMOL 500 MG PO TABS: 500.0000 mg | ORAL_TABLET | Freq: Three times a day (TID) | ORAL | 0 refills | Status: DC | PRN

## 2024-03-28 MED ORDER — MAGNESIUM OXIDE -MG SUPPLEMENT 400 (240 MG) MG PO TABS: 400.0000 mg | ORAL_TABLET | Freq: Every day | ORAL | Status: DC

## 2024-03-28 NOTE — Anesthesia Preprocedure Evaluation (Signed)
 Anesthesia Evaluation  Patient identified by MRN, date of birth, ID band Patient awake    Reviewed: Allergy & Precautions, NPO status , Patient's Chart, lab work & pertinent test results  History of Anesthesia Complications Negative for: history of anesthetic complications  Airway Mallampati: III  TM Distance: <3 FB Neck ROM: full    Dental  (+) Missing   Pulmonary shortness of breath, asthma    Pulmonary exam normal        Cardiovascular Exercise Tolerance: Good hypertension, + Peripheral Vascular Disease  Normal cardiovascular exam     Neuro/Psych  Headaches, Seizures -,  PSYCHIATRIC DISORDERS         GI/Hepatic Neg liver ROS, hiatal hernia,GERD  Controlled,,  Endo/Other  negative endocrine ROS    Renal/GU Renal disease  negative genitourinary   Musculoskeletal   Abdominal   Peds  Hematology negative hematology ROS (+)   Anesthesia Other Findings Patient is NPO appropriate and reports no nausea or vomiting today.  Past Medical History: No date: Anxiety No date: Arthritis No date: Asthma 03/08/2018: Complication of surgical procedure 07/17/2016: Constipation No date: Depression No date: Headache No date: History of hiatal hernia No date: Hypertension 09/25/2016: Immobility No date: Kidney cyst, acquired 03/28/2024: Nausea and vomiting No date: Seizures (HCC)     Comment:  hx. as child 04/16/2022: Senile purpura (HCC) No date: Shortness of breath dyspnea     Comment:  with excertion  Past Surgical History: No date: ABDOMINAL HYSTERECTOMY No date: BACK SURGERY No date: colonoscopy a year ago 03/2015: LEG SURGERY; Left No date: mass removed from back - 20 years ago 01/17/2017: SPINAL CORD STIMULATOR INSERTION; N/A     Comment:  Procedure: LUMBAR SPINAL CORD STIMULATOR INSERTION;                Surgeon: Burnetta Aures, MD;  Location: [facility] OR;  Service:               Orthopedics;  Laterality: N/A;  Requests  3 hrs No date: TONSILLECTOMY 05/13/2015: TOTAL KNEE REVISION; Left     Comment:  Procedure: REVISION LEFT  KNEE ARTHROPLASTY, REVISION               PATELLA POLY EXCHANGE;  Surgeon: Redell Shoals, MD;                Location: [facility];  Service: Orthopedics;  Laterality:               Left;  BMI    Body Mass Index: 25.33 kg/m      Reproductive/Obstetrics negative OB ROS                              Anesthesia Physical Anesthesia Plan  ASA: 3  Anesthesia Plan: General   Post-op Pain Management:    Induction: Intravenous  PONV Risk Score and Plan: Propofol  infusion and TIVA  Airway Management Planned: Natural Airway and Nasal Cannula  Additional Equipment:   Intra-op Plan:   Post-operative Plan:   Informed Consent: I have reviewed the patients History and Physical, chart, labs and discussed the procedure including the risks, benefits and alternatives for the proposed anesthesia with the patient or authorized representative who has indicated his/her understanding and acceptance.     Dental Advisory Given  Plan Discussed with: Anesthesiologist, CRNA and Surgeon  Anesthesia Plan Comments: (Patient consented for risks of anesthesia including but not limited to:  - adverse reactions to  medications - risk of airway placement if required - damage to eyes, teeth, lips or other oral mucosa - nerve damage due to positioning  - sore throat or hoarseness - Damage to heart, brain, nerves, lungs, other parts of body or loss of life  Patient voiced understanding and assent.)        Anesthesia Quick Evaluation

## 2024-03-28 NOTE — Care Management Obs Status (Signed)
 MEDICARE OBSERVATION STATUS NOTIFICATION   Patient Details  Name: Renee Bridges MRN: [id number] Date of Birth: [birth year]   Medicare Observation Status Notification Given:  Yes    Renee Bridges 03/28/2024, 12:47 PM

## 2024-03-28 NOTE — Transfer of Care (Signed)
 Immediate Anesthesia Transfer of Care Note  Patient: Renee Bridges  Procedure(s) Performed: EGD (ESOPHAGOGASTRODUODENOSCOPY)  Patient Location: Endoscopy Unit  Anesthesia Type:General  Level of Consciousness: awake, alert , and oriented  Airway & Oxygen Therapy: Patient Spontanous Breathing  Post-op Assessment: Report given to RN and Post -op Vital signs reviewed and stable  Post vital signs: Reviewed and stable  Last Vitals:  Vitals Value Taken Time  BP 100/59 03/28/24 11:03  Temp 35.8 1103  Pulse 76 03/28/24 11:03  Resp 23 03/28/24 11:03  SpO2 96 % 03/28/24 11:03  Vitals shown include unfiled device data.  Last Pain:  Vitals:   03/28/24 1102  TempSrc:   PainSc: 0-No pain         Complications: No notable events documented.

## 2024-03-28 NOTE — Plan of Care (Signed)
  Problem: Education: Goal: Knowledge of General Education information will improve Description: Including pain rating scale, medication(s)/side effects and non-pharmacologic comfort measures Outcome: Progressing   Problem: Health Behavior/Discharge Planning: Goal: Ability to manage health-related needs will improve Outcome: Progressing   Problem: Coping: Goal: Level of anxiety will decrease Outcome: Progressing   Problem: Elimination: Goal: Will not experience complications related to bowel motility Outcome: Progressing Goal: Will not experience complications related to urinary retention Outcome: Progressing   Problem: Pain Managment: Goal: General experience of comfort will improve and/or be controlled Outcome: Progressing   Problem: Nutrition: Goal: Adequate nutrition will be maintained Outcome: Not Progressing Note: NPO @ 0000

## 2024-03-28 NOTE — Anesthesia Postprocedure Evaluation (Signed)
 Anesthesia Post Note  Patient: Renee Bridges  Procedure(s) Performed: EGD (ESOPHAGOGASTRODUODENOSCOPY)  Patient location during evaluation: Endoscopy Anesthesia Type: General Level of consciousness: awake and alert Pain management: pain level controlled Vital Signs Assessment: post-procedure vital signs reviewed and stable Respiratory status: spontaneous breathing, nonlabored ventilation and respiratory function stable Cardiovascular status: blood pressure returned to baseline and stable Postop Assessment: no apparent nausea or vomiting Anesthetic complications: no   No notable events documented.   Last Vitals:  Vitals:   03/28/24 1102 03/28/24 1112  BP: (!) 92/50 (!) 103/57  Pulse: 78 80  Resp: (!) 25 (!) 24  Temp:    SpO2: 98% 97%    Last Pain:  Vitals:   03/28/24 1112  TempSrc:   PainSc: 0-No pain                 Fairy POUR Gyneth Hubka

## 2024-03-28 NOTE — Consult Note (Signed)
 Rogelia Copping, MD [facility]  [address], [address] [place], [place] [address] Phone: [phone] Fax : [phone]  Consultation  Referring Provider:    Dr. Fausto Primary Care Physician:  Vicci Duwaine SQUIBB, DO Primary Gastroenterologist:  Dr. Unk         Reason for Consultation:     Abdominal pain  Date of Admission:  03/27/2024 Date of Consultation:  03/28/2024            HPI:   Renee Bridges is a 78 y.o. female who was seen at [facility] GI by Dr. Unk yesterday for epigastric pain with nausea and vomiting.  The patient had been seen by her primary care provider and was reported not feeling well.  The patient was recently treated for hematuria and cystitis with a hemoglobin noted earlier this month to be 10.7 down from 12.3 in May.  The patient had a CT scan back on the 17th of this month that showed:  IMPRESSION:  1. No acute abdominopelvic findings.  2. Mild mural prominence of the cecum, which may be related to  underdistention. Recommend correlation with symptoms of colitis and  recent colonoscopy.  3. Apparent outpouching arising from the superior gastric body may  represent diverticulum or gastric ulceration.  4. Ovoid subcutaneous fat density mass within the left anterior  abdomen at the level of the umbilicus measuring 7.6 x 3.0 cm  contains more well-defined soft tissue densities. Differential  includes atypical lipomatous tumor, sequela of fat necrosis, or  other fat-containing tumors. Recommend surgical consultation.  5. Increased size of simple right upper pole renal cyst measuring  8.5 x 7.6 cm, previously 6.9 x 5.6 cm.  6. Aortic Atherosclerosis   It was reported that the patient does not take any anti-inflammatory medication nor does she drink or smoke.  There is reported that her symptoms of abdominal pain have been going on for approximately a month with associated weight loss and decreased appetite.  It is recommended that the patient go to the  emergency department for possible admission and upper endoscopy in addition to the recommendations of IV iron with a high dose PPI by her outpatient gastroenterologist.  The patient's hemoglobin on admission was 9.7 with the hemoglobin 8 days ago being 9.9.  Her iron saturation was low at 5% with her iron being low at 17.  The ferritin was also significantly decreased at 6.  The patient reports that she also has been having worsening pain when she goes to urinate and states that she is not having any pain at the present time.  She also endorses diarrhea.  Past Medical History:  Diagnosis Date   Anxiety    Arthritis    Asthma    Complication of surgical procedure 03/08/2018   Constipation 07/17/2016   Depression    Headache    History of hiatal hernia    Hypertension    Immobility 09/25/2016   Kidney cyst, acquired    Seizures (HCC)    hx. as child   Senile purpura (HCC) 04/16/2022   Shortness of breath dyspnea    with excertion    Past Surgical History:  Procedure Laterality Date   ABDOMINAL HYSTERECTOMY     BACK SURGERY     colonoscopy a year ago     LEG SURGERY Left 03/2015   mass removed from back - 20 years ago     SPINAL CORD STIMULATOR INSERTION N/A 01/17/2017   Procedure: LUMBAR SPINAL  CORD STIMULATOR INSERTION;  Surgeon: Burnetta Aures, MD;  [facility];  Service: Orthopedics;  Laterality: N/A;  Requests 3 hrs   TONSILLECTOMY     TOTAL KNEE REVISION Left 05/13/2015   Procedure: REVISION LEFT  KNEE ARTHROPLASTY, REVISION PATELLA POLY EXCHANGE;  Surgeon: Redell Shoals, MD;  Location: [facility] ORS;  Service: Orthopedics;  Laterality: Left;    Prior to Admission medications   Medication Sig Start Date End Date Taking? Authorizing Provider  gabapentin  (NEURONTIN ) 100 MG capsule Take 1 capsule (100 mg total) by mouth at bedtime. 01/04/24  Yes Johnson, Megan P, DO  levocetirizine (XYZAL ) 5 MG tablet Take 1 tablet (5 mg total) by mouth every evening. 01/04/24  Yes Johnson, Megan P, DO   omeprazole  (PRILOSEC) 20 MG capsule Take 1 capsule (20 mg total) by mouth daily. 03/20/24  Yes Johnson, Megan P, DO  ondansetron  (ZOFRAN -ODT) 4 MG disintegrating tablet Take 1 tablet (4 mg total) by mouth every 8 (eight) hours as needed for nausea or vomiting. 03/05/24  Yes Johnson, Megan P, DO  Vitamin D , Ergocalciferol , (DRISDOL ) 1.25 MG (50000 UNIT) CAPS capsule Take 1 capsule (50,000 Units total) by mouth every 7 (seven) days. Patient not taking: Reported on 03/20/2024 01/04/24   Vicci Duwaine SQUIBB, DO    Family History  Problem Relation Age of Onset   Cancer Mother    Cancer Father    Diabetes Brother    Cancer Brother    Cancer Brother      Social History   Tobacco Use   Smoking status: Never    Passive exposure: Past   Smokeless tobacco: Never  Vaping Use   Vaping status: Never Used  Substance Use Topics   Alcohol  use: No    Alcohol /week: 0.0 standard drinks of alcohol    Drug use: No    Allergies as of 03/27/2024 - Review Complete 03/27/2024  Allergen Reaction Noted   Bactrim  [sulfamethoxazole -trimethoprim ] Other (See Comments) 04/10/2018   Pollen extract  02/27/2020   Codeine Rash 03/26/2015    Review of Systems:    All systems reviewed and negative except where noted in HPI.   Physical Exam:  Vital signs in last 24 hours: Temp:  [98.1 F (36.7 C)-98.3 F (36.8 C)] 98.3 F (36.8 C) (07/25 0344) Pulse Rate:  [80-101] 84 (07/25 0344) Resp:  [16-17] 17 (07/25 0344) BP: (111-155)/(62-93) 111/62 (07/25 0344) SpO2:  [94 %-100 %] 96 % (07/25 0344) Weight:  [64.9 kg] 64.9 kg (07/24 1140)   General:   Pleasant, cooperative in NAD Head:  Normocephalic and atraumatic. Eyes:   No icterus.   Conjunctiva pink. PERRLA. Ears:  Normal auditory acuity. Neck:  Supple; no masses or thyroidomegaly Lungs: Respirations even and unlabored. Lungs clear to auscultation bilaterally.   No wheezes, crackles, or rhonchi.  Heart:  Regular rate and rhythm;  Without murmur, clicks, rubs or  gallops Abdomen:  Soft, nondistended, no report of any abdominal discomfort on palpation until the patient is asked to flex the abdominal wall muscles by lifting her legs 6 inches above the bed.. Normal bowel sounds. No appreciable masses or hepatomegaly.  No rebound or guarding.  Rectal:  Not performed. Msk:  Symmetrical without gross deformities.    Extremities:  Without edema, cyanosis or clubbing. Neurologic:  Alert and oriented x3;  grossly normal neurologically. Skin:  Intact without significant lesions or rashes. Cervical Nodes:  No significant cervical adenopathy. Psych:  Alert and cooperative. Normal affect.  LAB RESULTS: Recent Labs    03/27/24 1143  WBC 9.0  HGB 9.7*  HCT 32.7*  PLT 303   BMET Recent Labs    03/27/24 1143 03/28/24 0527  NA 139 143  K 4.3 4.0  CL 105 110  CO2 23 24  GLUCOSE 118* 98  BUN 11 9  CREATININE 1.06* 1.00  CALCIUM  9.1 8.4*   LFT Recent Labs    03/27/24 1143  PROT 6.6  ALBUMIN 3.3*  AST 18  ALT 12  ALKPHOS 59  BILITOT 0.7   PT/INR No results for input(s): LABPROT, INR in the last 72 hours.  STUDIES: No results found.    Impression / Plan:   Assessment: Principal Problem:   Abdominal pain   Renee Bridges is a 78 y.o. y/o female with multiple medical issues including anemia and abnormal imaging of her abdomen with recent coughing, vomiting and abdominal pain.  She also endorses diarrhea.  The patient will be set up for a upper endoscopy for today due to the abnormal CT scan findings.  I have also discussed with the patient that some of her abdominal pain is clearly related to musculoskeletal and less likely due to her vomiting and coughing.  She is pain-free when palpating the abdomen while she is relaxed but the pain has been exacerbated when she is using the bathroom and straining and also with flexing the abdominal wall muscles.  Plan:  The patient will have an upper endoscopy today to evaluate the area of question  on her imaging.  The patient will likely need an outpatient colonoscopy due to her anemia and report of diarrhea.  The patient has been explained the risks and benefits of the procedure and agrees to proceed with the procedure today.  Thank you for involving me in the care of this patient.      LOS: 0 days   Rogelia Copping, MD, MD. NOLIA 03/28/2024, 7:32 AM,  Pager [phone] 7am-5pm  Check AMION for 5pm -7am coverage and on weekends   Note: This dictation was prepared with Dragon dictation along with smaller phrase technology. Any transcriptional errors that result from this process are unintentional.

## 2024-03-28 NOTE — Op Note (Signed)
 [facility] Gastroenterology Patient Name: Renee Bridges Procedure Date: 03/28/2024 10:44 AM MRN: [id number] Account #: [id number] Date of Birth: 12-Nov-1945 Admit Type: Inpatient Age: 78 Room: [facility] ENDO ROOM 4 Gender: Female Note Status: Finalized Instrument Name: Upper Endoscope [id number] Procedure:             Upper GI endoscopy Indications:           Epigastric abdominal pain, Abnormal CT of the GI tract Providers:             Rogelia Copping MD, MD Referring MD:          Duwaine MYRTIS Louder (Referring MD) Medicines:             Propofol  per Anesthesia Complications:         No immediate complications. Procedure:             Pre-Anesthesia Assessment:                        - Prior to the procedure, a History and Physical was                         performed, and patient medications and allergies were                         reviewed. The patient's tolerance of previous                         anesthesia was also reviewed. The risks and benefits                         of the procedure and the sedation options and risks                         were discussed with the patient. All questions were                         answered, and informed consent was obtained. Prior                         Anticoagulants: The patient has taken no anticoagulant                         or antiplatelet agents. ASA Grade Assessment: II - A                         patient with mild systemic disease. After reviewing                         the risks and benefits, the patient was deemed in                         satisfactory condition to undergo the procedure.                        After obtaining informed consent, the endoscope was                         passed under direct vision. Throughout the procedure,  the patient's blood pressure, pulse, and oxygen                         saturations were monitored continuously. The Endoscope                         was  introduced through the mouth, and advanced to the                         second part of duodenum. The upper GI endoscopy was                         accomplished without difficulty. The patient tolerated                         the procedure well. Findings:      A small hiatal hernia was present.      The entire examined stomach was normal. Biopsies were taken with a cold       forceps for histology.      The examined duodenum was normal. Impression:            - Small hiatal hernia.                        - Normal stomach. Biopsied.                        - Normal examined duodenum. Recommendation:        - Return patient to hospital ward for ongoing care.                        - Resume regular diet.                        - Await pathology results. Procedure Code(s):     --- Professional ---                        [phone], Esophagogastroduodenoscopy, flexible,                         transoral; with biopsy, single or multiple Diagnosis Code(s):     --- Professional ---                        R93.3, Abnormal findings on diagnostic imaging of                         other parts of digestive tract                        R10.13, Epigastric pain CPT copyright 2022 American Medical Association. All rights reserved. The codes documented in this report are preliminary and upon coder review may  be revised to meet current compliance requirements. Rogelia Copping MD, MD 03/28/2024 10:56:38 AM This report has been signed electronically. Number of Addenda: 0 Note Initiated On: 03/28/2024 10:44 AM Estimated Blood Loss:  Estimated blood loss: none.      [facility]

## 2024-03-28 NOTE — Consult Note (Signed)
 Patient ID: Renee Bridges, female   DOB: Sep 30, 1945, 78 y.o.   MRN: [id number] CC: Soft Tissue Mass History of Present Illness Renee Bridges is a 78 y.o. female with past medical history as below who is seen in consultation for left abdominal wall subcutaneous mass.  The patient reports that over the last several months she has had persistent nausea and vomiting.  She says that every time she eats she gets nauseated and vomits some of it up.  This is also associated with epigastric pain.  The pain does not radiate and is worsened by eating.  She had a CT scan that was concerning for a left abdominal wall soft tissue mass.  She denies any left-sided abdominal pain.  She denies any overlying skin changes or feeling of mass in her abdominal wall.  She was seen by GI in the office yesterday and due to persistent nausea and vomiting and anemia she was admitted to the hospital and underwent an EGD today.  The EGD was largely normal but did show a very small hiatal hernia..  Past Medical History Past Medical History:  Diagnosis Date   Anxiety    Arthritis    Asthma    Complication of surgical procedure 03/08/2018   Constipation 07/17/2016   Depression    Headache    History of hiatal hernia    Hypertension    Immobility 09/25/2016   Kidney cyst, acquired    Nausea and vomiting 03/28/2024   Seizures (HCC)    hx. as child   Senile purpura (HCC) 04/16/2022   Shortness of breath dyspnea    with excertion       Past Surgical History:  Procedure Laterality Date   ABDOMINAL HYSTERECTOMY     BACK SURGERY     colonoscopy a year ago     LEG SURGERY Left 03/2015   mass removed from back - 20 years ago     SPINAL CORD STIMULATOR INSERTION N/A 01/17/2017   Procedure: LUMBAR SPINAL CORD STIMULATOR INSERTION;  Surgeon: Burnetta Aures, MD;  Location: [facility] OR;  Service: Orthopedics;  Laterality: N/A;  Requests 3 hrs   TONSILLECTOMY     TOTAL KNEE REVISION Left 05/13/2015   Procedure: REVISION LEFT  KNEE  ARTHROPLASTY, REVISION PATELLA POLY EXCHANGE;  Surgeon: Redell Shoals, MD;  Location: [facility] ORS;  Service: Orthopedics;  Laterality: Left;    Allergies  Allergen Reactions   Bactrim  [Sulfamethoxazole -Trimethoprim ] Other (See Comments)    Sweating, feeling like throat is closing up   Pollen Extract    Codeine Rash    Current Facility-Administered Medications  Medication Dose Route Frequency Provider Last Rate Last Admin   0.9 %  sodium chloride  infusion   Intravenous Continuous Jinny Carmine, MD   Paused at 03/28/24 9043   acetaminophen  (TYLENOL ) tablet 650 mg  650 mg Oral Q6H PRN Jinny Carmine, MD       Or   acetaminophen  (TYLENOL ) suppository 650 mg  650 mg Rectal Q6H PRN Jinny Carmine, MD       bisacodyl  (DULCOLAX) EC tablet 5 mg  5 mg Oral Daily PRN Jinny Carmine, MD       enoxaparin  (LOVENOX ) injection 40 mg  40 mg Subcutaneous Q24H Jinny Carmine, MD       labetalol  (NORMODYNE ) injection 5 mg  5 mg Intravenous Q4H PRN Jinny Carmine, MD       magnesium  oxide (MAG-OX) tablet 400 mg  400 mg Oral QHS Jinny Carmine, MD   400 mg at  03/27/24 2047   methocarbamol  (ROBAXIN ) tablet 500 mg  500 mg Oral Q8H PRN Jinny Carmine, MD       promethazine  (PHENERGAN ) tablet 12.5 mg  12.5 mg Oral Q6H PRN Jinny Carmine, MD       senna-docusate (Senokot-S) tablet 1 tablet  1 tablet Oral QHS PRN Jinny Carmine, MD        Family History Family History  Problem Relation Age of Onset   Cancer Mother    Cancer Father    Diabetes Brother    Cancer Brother    Cancer Brother        Social History Social History   Tobacco Use   Smoking status: Never    Passive exposure: Past   Smokeless tobacco: Never  Vaping Use   Vaping status: Never Used  Substance Use Topics   Alcohol  use: No    Alcohol /week: 0.0 standard drinks of alcohol    Drug use: No        ROS Full ROS of systems performed and is otherwise negative there than what is stated in the HPI  Physical Exam Blood pressure (!) 107/59, pulse 75,  temperature 98.1 F (36.7 C), temperature source Temporal, resp. rate (!) 24, height 5' 3 (1.6 m), weight 64.9 kg, SpO2 94%.  Alert and oriented x 3, normal work of breathing on room air, regular rate and rhythm, abdomen soft, some tenderness in the epigastrium and in the left upper quadrant without any rebound tenderness or guarding.  There is no overlying skin changes.  Is difficult to palpate a discrete lesion in the left hemiabdomen although there is some prominence just to the left lateral area of the umbilicus.  Data Reviewed I reviewed her labs which are significant for anemia with hemoglobin in the nines.  I have also independently reviewed her CT scan.  I do not see a very large hiatal hernia on the CT scan.  There is no anatomic abnormality that may be the etiology of her nausea and vomiting.  In the left hemiabdomen there is a subcutaneous well encapsulated mass.  I have personally reviewed the patient's imaging and medical records.    Assessment/Plan    Patient with abdominal wall subcutaneous soft tissue mass.  She is having symptoms of nausea and vomiting.  She also is anemic.  I do not think that the soft tissue mass is causing these symptoms.  From surgical standpoint there is nothing emergent that we need to do.  We may need to entertain biopsy of the mass on an outpatient basis.  From my perspective if she can tolerate a diet she can be discharged and follow-up in my clinic in 2 to 3 weeks.  A total of 60 minutes was spent reviewing the patient's chart, performing history and physical and discussing treatment options with the patient   Jayson MALVA Endow 03/28/2024, 12:13 PM

## 2024-03-28 NOTE — Discharge Summary (Addendum)
 Physician Discharge Summary   Patient: Renee Bridges MRN: [id number] DOB: Jan 02, 1946  Admit date:     03/27/2024  Discharge date: 03/28/2024  Discharge Physician: Burnard DELENA Cunning   PCP: Vicci Duwaine SQUIBB, DO   Recommendations at discharge:    Follow up with Primary Care in 1-2 weeks Repeat CBC, CMP at follow up  Discharge Diagnoses: Active Problems:   Abnormal computed tomography of gastrointestinal tract  Principal Problem (Resolved):   Abdominal pain Resolved Problems:   Nausea and vomiting   Abdominal pain, epigastric  Hospital Course: Renee Bridges is a 78 y.o. female with medical history significant of anxiety, hypertension, constipation who presents to the ED today for evaluation of abdominal pain and nausea vomiting.  Patient was seen by GI as outpatient for follow-up today and was referred to the ED given persistence of her symptoms including declining hemoglobin.  She had a CT scan last week showing possible gastric ulceration without other significant acute findings.  Patient reports symptoms over the past several weeks to month.  She has intermittent mid/upper abdominal pain, nausea vomiting leading to poor p.tolerance for o. intake.  Patient reports intermittent associated muscle spasms and worsening generalized weakness since onset of symptoms.  She reports some lower abdominal discomfort with voiding but no frank dysuria or hematuria.  Patient denies fevers chills, cough, sore throat, congestion, shortness of breath, chest pain, unilateral weakness numbness tingling, headaches or other recent illnesses or symptoms.   ED course Temp 98.1 F, HR 80-1 01, RR 17, BP 155/80, SpO2 100% on room air. Labs were obtained including CMP and CBC which were notable for nonfasting glucose of 118, creatinine 1.06 stable from prior, albumin 3.3.  LFTs and lipase were normal.  CBC showed hemoglobin 9.7 down slightly from recent 9.9, 10.4 earlier this month, no leukocytosis.  UA showed rare  bacteria but negative leukocytes, negative nitrites, 0-5 WBCs -not consistent with UTI.   CT abdomen pelvis from 03/20/2024 reviewed -showed no acute abdominopelvic findings.  Mild mural prominence of the cecum felt related to underdistention, apparent outpouching arising from the superior gastric body felt to represent diverticulum or gastric ulceration, subcutaneous fat density in the left anterior abdomen at the level of the umbilicus (differential atypical lipomatous tumor, sequelae of fat necrosis or other fat-containing tumor, surgical consultation was recommended), increased size of simple right renal cyst 8.5 cm x 7.6 cm, previously 6.9 x 5.6 cm   ED treatment: GI cocktail, IV Zofran , 500 cc bolus normal saline.   Patient's outpatient gastroenterologist, Dr. Unk was contacted by EDP, recommended admission for further evaluation including EGD tomorrow given patient's persistent symptoms and downtrending hemoglobin.  Patient being admitted to med surge floor for observation.    7/25 -- pt underwent EGD this AM which showed a small hiatal hernia but otherwise unremarkable.  Stomach biopsies were taken.  Patient's symptoms have improved, diet was resumed and patient tolerating that without issues.  Patient is clinically improved and medically stable for discharge today, she is agreeable.  Her granddaughter was updated and agreeable with plan.  Assessment and Plan:  Abdominal pain, nausea and vomiting Persistent symptoms over several weeks to 1 month.  Nonacute CT abdomen pelvis last week but possible findings that could be consistent with gastric ulceration. -- Admitted for observation -- GI consulted  -- EGD this AM -- no findings to explain symptoms were noted -- Follow gastric biopsies -- Diet resumed & patient tolerating without symptoms recurring -- Repeat BMP, CBC in a.m.  Leg cramps suspect due to mild dehydration from above-, poor p.o. intake -- Robaxin  as needed --  Magnesium  at bedtime --IV hydration as above   Hypertension --resume home regimen at d/c -- IV labetalol  PRN during admission while NPO       Consultants: GI Procedures performed: EGD  Disposition: Home Diet recommendation:  Regular diet DISCHARGE MEDICATION: Allergies as of 03/28/2024       Reactions   Bactrim  [sulfamethoxazole -trimethoprim ] Other (See Comments)   Sweating, feeling like throat is closing up   Pollen Extract    Codeine Rash        Medication List     TAKE these medications    gabapentin  100 MG capsule Commonly known as: NEURONTIN  Take 1 capsule (100 mg total) by mouth at bedtime.   levocetirizine 5 MG tablet Commonly known as: XYZAL  Take 1 tablet (5 mg total) by mouth every evening.   magnesium  oxide 400 (240 Mg) MG tablet Commonly known as: MAG-OX Take 1 tablet (400 mg total) by mouth at bedtime.   methocarbamol  500 MG tablet Commonly known as: ROBAXIN  Take 1 tablet (500 mg total) by mouth every 8 (eight) hours as needed for muscle spasms.   omeprazole  20 MG capsule Commonly known as: PRILOSEC Take 1 capsule (20 mg total) by mouth daily.   ondansetron  4 MG disintegrating tablet Commonly known as: ZOFRAN -ODT Take 1 tablet (4 mg total) by mouth every 8 (eight) hours as needed for nausea or vomiting.   Vitamin D  (Ergocalciferol ) 1.25 MG (50000 UNIT) Caps capsule Commonly known as: DRISDOL  Take 1 capsule (50,000 Units total) by mouth every 7 (seven) days.        Follow-up Information     Vicci Bouchard P, DO. Schedule an appointment as soon as possible for a visit.   Specialty: Family Medicine Why: Follow up in 1-2 weeks Contact information: [address] [place] [address] [phone]         Unk Corinn Skiff, MD. Call.   Specialty: Gastroenterology Why: Call to confirm or schedule next follow up Contact information: [address] [place] [address] [phone]                Discharge Exam: Renee Bridges  Weights   03/27/24 1140  Weight: 64.9 kg   General exam: awake, alert, no acute distress HEENT: moist mucus membranes, hearing grossly normal  Respiratory system: CTAB, no wheezes, rales or rhonchi, normal respiratory effort. Cardiovascular system: normal S1/S2, RRR Gastrointestinal system: soft, NT, ND, no HSM felt, +bowel sounds. Central nervous system: A&O x3. no gross focal neurologic deficits, normal speech Extremities: moves all, no edema, normal tone Skin: dry, intact, normal temperature Psychiatry: normal mood, congruent affect, judgement and insight appear normal   Condition at discharge: stable  The results of significant diagnostics from this hospitalization (including imaging, microbiology, ancillary and laboratory) are listed below for reference.   Imaging Studies: CT ABDOMEN PELVIS W CONTRAST Result Date: 03/20/2024 CLINICAL DATA:  Several month history of mid epigastric pain associated with nausea and vomiting EXAM: CT ABDOMEN AND PELVIS WITH CONTRAST TECHNIQUE: Multidetector CT imaging of the abdomen and pelvis was performed using the standard protocol following bolus administration of intravenous contrast. RADIATION DOSE REDUCTION: This exam was performed according to the departmental dose-optimization program which includes automated exposure control, adjustment of the mA and/or kV according to patient size and/or use of iterative reconstruction technique. CONTRAST:  85mL OMNIPAQUE  IOHEXOL  300 MG/ML  SOLN COMPARISON:  CT abdomen and pelvis dated  09/15/2016 FINDINGS: Lower chest: 4 mm left lower lobe nodule (3:8), unchanged. No specific follow-up imaging recommended. No pleural effusion or pneumothorax demonstrated. Partially imaged heart size is normal. Hepatobiliary: No focal hepatic lesions. No intra or extrahepatic biliary ductal dilation. Gallbladder is contracted. Pancreas: No focal lesions or main ductal dilation. Spleen: Normal in size without focal abnormality.  Adrenals/Urinary Tract: No adrenal nodules. Increased size of simple right upper pole renal cyst measuring 8.5 x 7.6 cm (2:21), previously 6.9 x 5.6 cm. Slightly inferiorly is a hyperattenuating cyst measuring 1.3 cm (2:28), previously 0.6 cm, likely hemorrhagic/proteinaceous. No hydronephrosis or calculi. No focal bladder wall thickening. Stomach/Bowel: Apparent outpouching arising from the superior gastric body (6:108). No abnormal bowel dilation. Mild mural prominence of the cecum, which may be related to underdistention. Colonic diverticulosis without acute diverticulitis. Appendix is not well Vascular/Lymphatic: Aortic atherosclerosis. No enlarged abdominal or pelvic lymph nodes. Reproductive: No adnexal masses. Other: No free fluid, fluid collection, or free air. Musculoskeletal: No acute or abnormal lytic or blastic osseous lesions. Multilevel degenerative changes of the partially imaged thoracic and lumbar spine. Ovoid subcutaneous fat density mass within the left anterior abdomen at the level of the umbilicus measures 7.6 x 3.0 cm (2:34). Within this fat density mass is a rounded focus of well-circumscribed soft tissue attenuation measuring 1.7 x 1.3 cm (2:35). An additional smaller, subtle density is also seen, measuring 6 mm (2:34). Intrathecal stimulator in the left flank with leads terminating above the field of view. IMPRESSION: 1. No acute abdominopelvic findings. 2. Mild mural prominence of the cecum, which may be related to underdistention. Recommend correlation with symptoms of colitis and recent colonoscopy. 3. Apparent outpouching arising from the superior gastric body may represent diverticulum or gastric ulceration. 4. Ovoid subcutaneous fat density mass within the left anterior abdomen at the level of the umbilicus measuring 7.6 x 3.0 cm contains more well-defined soft tissue densities. Differential includes atypical lipomatous tumor, sequela of fat necrosis, or other fat-containing tumors.  Recommend surgical consultation. 5. Increased size of simple right upper pole renal cyst measuring 8.5 x 7.6 cm, previously 6.9 x 5.6 cm. 6.  Aortic Atherosclerosis (ICD10-I70.0). Electronically Signed   By: Limin  Xu M.D.   On: 03/20/2024 14:12    Microbiology: Results for orders placed or performed in visit on 03/20/24  Microscopic Examination     Status: None   Collection Time: 03/20/24 11:13 AM   Urine  Result Value Ref Range Status   WBC, UA 0-5 0 - 5 /hpf Final   RBC, Urine 0-2 0 - 2 /hpf Final   Epithelial Cells (non renal) 0-10 0 - 10 /hpf Final   Bacteria, UA None seen None seen/Few Final  Urine Culture     Status: None   Collection Time: 03/20/24 11:44 AM   Specimen: Urine   UR  Result Value Ref Range Status   Urine Culture, Routine Final report  Final   Organism ID, Bacteria No growth  Final    Labs: CBC: Recent Labs  Lab 03/27/24 1143  WBC 9.0  HGB 9.7*  HCT 32.7*  MCV 84.3  PLT 303   Basic Metabolic Panel: Recent Labs  Lab 03/27/24 1143 03/28/24 0527  NA 139 143  K 4.3 4.0  CL 105 110  CO2 23 24  GLUCOSE 118* 98  BUN 11 9  CREATININE 1.06* 1.00  CALCIUM  9.1 8.4*   Liver Function Tests: Recent Labs  Lab 03/27/24 1143  AST 18  ALT 12  ALKPHOS  59  BILITOT 0.7  PROT 6.6  ALBUMIN 3.3*   CBG: No results for input(s): GLUCAP in the last 168 hours.  Discharge time spent: greater than 30 minutes.  Signed: Burnard DELENA Cunning, DO [facility] 03/31/2024

## 2024-03-31 ENCOUNTER — Inpatient Hospital Stay

## 2024-03-31 ENCOUNTER — Encounter: Payer: Self-pay | Admitting: Internal Medicine

## 2024-03-31 ENCOUNTER — Telehealth: Payer: Self-pay

## 2024-03-31 ENCOUNTER — Encounter: Admitting: Internal Medicine

## 2024-03-31 LAB — SURGICAL PATHOLOGY

## 2024-03-31 NOTE — Transitions of Care (Post Inpatient/ED Visit) (Unsigned)
   03/31/2024  Name: Renee Bridges MRN: [id number] DOB: 07-28-1946  Today's TOC FU Call Status: Today's TOC FU Call Status:: Unsuccessful Call (1st Attempt) Unsuccessful Call (1st Attempt) Date: 03/31/24  Attempted to reach the patient regarding the most recent Inpatient/ED visit.  Follow Up Plan: Additional outreach attempts will be made to reach the patient to complete the Transitions of Care (Post Inpatient/ED visit) call.   Signature Julian Lemmings, LPN [facility] Nurse Health Advisor Direct Dial [phone]

## 2024-04-01 ENCOUNTER — Telehealth: Payer: Self-pay | Admitting: Family Medicine

## 2024-04-01 ENCOUNTER — Ambulatory Visit: Payer: Self-pay | Admitting: *Deleted

## 2024-04-01 NOTE — Telephone Encounter (Signed)
 I'm sorry, I don't know which referral this refers to

## 2024-04-01 NOTE — Telephone Encounter (Signed)
 Copied from CRM [id number]. Topic: Referral - Status >> Apr 01, 2024  9:16 AM Jasmin G wrote: Reason for CRM: Staff from [facility] called to let clinic know that recent referral made could not go through due to Dr not being able to see pt.

## 2024-04-01 NOTE — Telephone Encounter (Signed)
 FYI Only or Action Required?: Action required by provider: update on patient condition.  Patient was last seen in primary care on 03/20/2024 by Vicci Duwaine SQUIBB, DO.  Called Nurse Triage reporting Abdominal Pain.  Symptoms began several days ago.  Interventions attempted: Prescription medications: omeprazole  .  Symptoms are: unchanged.  Triage Disposition: See Physician Within 24 Hours  Patient/caregiver understands and will follow disposition?: No, refuses disposition   Reason for Disposition  [1] MODERATE pain (e.g., interferes with normal activities) AND [2] comes and goes (cramps) AND [3] present > 24 hours  (Exception: Pain with Vomiting or Diarrhea - see that Guideline.)  Answer Assessment - Initial Assessment Questions Patient request : Do not contact [name] per patient Patient is very upset- she states she does not have anybody to help her- she is in sinking boat Patient seems very upset- she states she has no one to help her any more- she is unable to comes to the office for an acute visit- she can arrange transportation through her insurance with 3 days notice. Patient declines appointment. Patient states she is going to request- she believes her symptoms are because she is upset- she will try to eat after she rests. Patient advised I will contact the office for assistance for her.    1. LOCATION: Where does it hurt?      Upper- midline- at hernia 2. RADIATION: Does the pain shoot anywhere else? (e.g., chest, back)     Soreness to left- patient states she does not have transportation 3. ONSET: When did the pain begin? (e.g., minutes, hours or days ago)      Started when patient came home from the hospital- Friday 4. SUDDEN: Gradual or sudden onset?     Patient states she has muscle spasms and bruising on the legs 5. PATTERN Does the pain come and go, or is it constant?     Comes and goes- patient states she has pain with eating- patient has not eaten today-   6. SEVERITY: How bad is the pain?  (e.g., Scale 1-10; mild, moderate, or severe)     8/10 7. RECURRENT SYMPTOM: Have you ever had this type of stomach pain before? If Yes, ask: When was the last time? and What happened that time?      Patient states the pain is like what she had when she went to hospital 8. AGGRAVATING FACTORS: Does anything seem to cause this pain? (e.g., foods, stress, alcohol )     food 9. CARDIAC SYMPTOMS: Do you have any of the following symptoms: chest pain, difficulty breathing, sweating, nausea?     When patient is active- she get SOB 10. OTHER SYMPTOMS: Do you have any other symptoms? (e.g., back pain, diarrhea, fever, urination pain, vomiting)       Patient states she is struggling to do house work, no BM since Friday- constipated  Protocols used: Abdominal Pain - Upper-A-AH   Copied from CRM #[id number]. Topic: Clinical - Red Word Triage >> Apr 01, 2024  2:25 PM Fonda T wrote: Red Word that prompted transfer to Nurse Triage: Patient calling states she is having upper abdominal pain, especially after eating. Also states she stay thirsty, and drinking a lot of water.

## 2024-04-01 NOTE — Transitions of Care (Post Inpatient/ED Visit) (Unsigned)
   04/01/2024  Name: Renee Bridges MRN: [id number] DOB: Aug 12, 1946  Today's TOC FU Call Status: Today's TOC FU Call Status:: Unsuccessful Call (2nd Attempt) Unsuccessful Call (1st Attempt) Date: 03/31/24 Unsuccessful Call (2nd Attempt) Date: 04/01/24  Attempted to reach the patient regarding the most recent Inpatient/ED visit.  Follow Up Plan: Additional outreach attempts will be made to reach the patient to complete the Transitions of Care (Post Inpatient/ED visit) call.   Signature Julian Lemmings, LPN [facility] Nurse Health Advisor Direct Dial [phone]

## 2024-04-02 NOTE — Telephone Encounter (Signed)
 She is supposed to have a follow up with Dr. Unk (GI at [facility]) now that she's out of the hospital- can we please help her get scheduled for that to go over her abdominal pain? Thanks!

## 2024-04-02 NOTE — Transitions of Care (Post Inpatient/ED Visit) (Signed)
   04/02/2024  Name: Renee Bridges MRN: [id number] DOB: 05/21/1946  Today's TOC FU Call Status: Today's TOC FU Call Status:: Unsuccessful Call (2nd Attempt) Unsuccessful Call (1st Attempt) Date: 03/31/24 Unsuccessful Call (2nd Attempt) Date: 04/01/24  Attempted to reach the patient regarding the most recent Inpatient/ED visit.  Follow Up Plan: No further outreach attempts will be made at this time. We have been unable to contact the patient. Patient dismissed Signature Julian Lemmings, LPN [facility] Nurse Health Advisor Direct Dial [phone]

## 2024-04-02 NOTE — Telephone Encounter (Signed)
 She already sees Dr. Unk at [facility]. That referral doesn't need to go through.

## 2024-04-03 ENCOUNTER — Ambulatory Visit: Payer: Self-pay | Admitting: Gastroenterology

## 2024-04-03 DIAGNOSIS — M79605 Pain in left leg: Secondary | ICD-10-CM | POA: Diagnosis not present

## 2024-04-03 DIAGNOSIS — E782 Mixed hyperlipidemia: Secondary | ICD-10-CM | POA: Diagnosis not present

## 2024-04-03 DIAGNOSIS — N3001 Acute cystitis with hematuria: Secondary | ICD-10-CM | POA: Diagnosis not present

## 2024-04-03 DIAGNOSIS — R112 Nausea with vomiting, unspecified: Secondary | ICD-10-CM | POA: Diagnosis not present

## 2024-04-03 DIAGNOSIS — Z9181 History of falling: Secondary | ICD-10-CM | POA: Diagnosis not present

## 2024-04-04 ENCOUNTER — Ambulatory Visit: Payer: Self-pay

## 2024-04-04 NOTE — Telephone Encounter (Signed)
 Next follow up is 04/20/2024.

## 2024-04-04 NOTE — Telephone Encounter (Signed)
 FYI Only or Action Required?: FYI only for provider.  Patient was last seen in primary care on 03/20/2024 by Vicci Duwaine SQUIBB, DO.  Called Nurse Triage reporting vomiting.  Symptoms began today.  Interventions attempted: Nothing.  Symptoms are: stable.  Triage Disposition: See HCP Within 4 Hours (Or PCP Triage)  Patient/caregiver understands and will follow disposition?: No, refuses disposition    Copied from CRM [id number]. Topic: Clinical - Red Word Triage >> Apr 04, 2024  1:26 PM Gustabo D wrote: Patient is still throwing up. Last night there was blood in it. I don't know how long Reason for Disposition  [1] Vomiting AND [2] abdomen looks much more swollen than usual  Answer Assessment - Initial Assessment Questions 1. APPEARANCE of BLOOD: What does the blood look like? (e.g., pink, red blood, coffee-grounds)     Bright red 2. AMOUNT: How much blood was lost? (e.g., few streaks or strands, tablespoon, cup)     Small amount 3. VOMITING BLOOD: How many times did it happen? or How many times in the past 24 hours?     One time 4. VOMITING WITHOUT BLOOD: How many times in the past 24 hours?      denies 5. ONSET: When did vomiting of blood begin?     Last night blood in it, just a stream 6. CAUSE: What do you think is causing the vomiting of blood?     unknown 7. BLOOD THINNERS: Do you take any blood thinners? (e.g., Coumadin/warfarin, Pradaxa/dabigatran, aspirin)     denies 8. DEHYDRATION: Are there any signs of dehydration? When was the last time you urinated? Do you feel dizzy?     Dry mouth 9. ABDOMEN PAIN: Are you having any abdomen pain? If Yes, ask: What does it feel like? (e.g., crampy, dull, intermittent, constant)      Abd states a little bit 10. DIARRHEA: Is there any diarrhea? If Yes, ask: How many times today?        denies 11. OTHER SYMPTOMS: Do you have any other symptoms? (e.g., fever, blood in stool)       Tightness in legs 12.  PREGNANCY: Is there any chance you are pregnant? When was your last menstrual period?       na  Protocols used: Vomiting Blood-A-AH

## 2024-04-07 NOTE — Telephone Encounter (Signed)
 Can we call patient's grand daughter, Harlene,  who is very active in patient's care and make her aware of the phone call and the recommendation.

## 2024-04-07 NOTE — Telephone Encounter (Signed)
 Attempted to reach [facility] no answer and vm was full will attempt at a later time

## 2024-04-08 ENCOUNTER — Ambulatory Visit: Payer: Self-pay

## 2024-04-08 NOTE — Telephone Encounter (Signed)
 FYI Only or Action Required?: Action required by provider: update on patient condition.  Patient was last seen in primary care on 03/20/2024 by Vicci Duwaine SQUIBB, DO.  Called Nurse Triage reporting Dizziness.  Symptoms began several weeks ago.  Interventions attempted: Prescription medications: Zofran  and Rest, hydration, or home remedies.  Symptoms are: lightheaded/weakness when standing, 6/10 headache, dry mouth, weakness (she states her legs feel like they are going to give out), decreased appetite (she states she has only been able to eat jello or apple sauce), nausea, abdominal pain with bowel movement and constipation (unsure but states last BM was sometime last week), intermittent back pain. gradually worsening.  Triage Disposition: Go to ED Now (or PCP Triage)  Patient/caregiver understands and will follow disposition?: Unsure- patient refused and states she will call rescue if she gets worse    Copied from CRM #[id number]. Topic: Clinical - Red Word Triage >> Apr 08, 2024 12:07 PM Renee Bridges wrote: Red Word that prompted transfer to Nurse Triage: Patient called in stating whhen she stand up it's feel like she will pass out. Reason for Disposition  [1] Drinking very little AND [2] dehydration suspected (e.g., no urine > 12 hours, very dry mouth, very lightheaded)  Answer Assessment - Initial Assessment Questions Patient states if she gets any worse she will consider going to the ED and calling rescue. Advised patient she is at risk for passing out or falling in her home and ED is advised. Patient verbalizes understanding and states she will call 911 if she get worse. Patient asks that we don't call her granddaughter, Renee Bridges. [facility] closed for lunch, routing message high priority to clinic.  1. DESCRIPTION: Describe your dizziness.     Patient states when she is standing up she feels like she is gonna pass out. Last night she was in the kitchen making food and she was afraid she would  fall if she didn't sit down. She states she has to try to sit down in a hurry. She is worried, she has dishes to do but she doesn't feel she can stand for that long. She states it feels like she doesn't have any energy. Denies any LOC.  2. LIGHTHEADED: Do you feel lightheaded? (e.g., somewhat faint, woozy, weak upon standing)     Yes.  3. VERTIGO: Do you feel like either you or the room is spinning or tilting? (i.e., vertigo)     No.  4. SEVERITY: How bad is it?  Do you feel like you are going to faint? Can you stand and walk?     Yes, patient states it feels like she is going to fall.  5. ONSET:  When did the dizziness begin?     She states it happens every day and every night. Patient states she has felt this way since coming home from the hospital about 1.5 weeks ago.  6. AGGRAVATING FACTORS: Does anything make it worse? (e.g., standing, change in head position)     Standing.  7. HEART RATE: Can you tell me your heart rate? How many beats in 15 seconds?  (Note: Not all patients can do this.)       No.  8. CAUSE: What do you think is causing the dizziness? (e.g., decreased fluids or food, diarrhea, emotional distress, heat exposure, new medicine, sudden standing, vomiting; unknown)     Patient mentions she is under a lot of stress. She also is asking if this could be her anemia. She states she is very weak  and her legs can't hold her up sometimes. She states she feels dehydrated, she is trying to drink more water and Gatorade.  9. RECURRENT SYMPTOM: Have you had dizziness before? If Yes, ask: When was the last time? What happened that time?     No.  10. OTHER SYMPTOMS: Do you have any other symptoms? (e.g., fever, chest pain, vomiting, diarrhea, bleeding)       6/10 headache, dry mouth, weakness (she states her legs feel like they are going to give out), decreased appetite (she states she has only been able to eat jello or apple sauce), nausea, abdominal pain  with bowel movement and constipation (unsure but states last BM was sometime last week), intermittent back pain. Patient denies any bleeding, chest pain, SOB, unilateral numbness or weakness, changes in speech or vision.  11. PREGNANCY: Is there any chance you are pregnant? When was your last menstrual period?       N/A.  Protocols used: Dizziness - Lightheadedness-A-AH

## 2024-04-08 NOTE — Telephone Encounter (Signed)
 Second attempt to reach pt granddaughter panama

## 2024-04-09 DIAGNOSIS — E782 Mixed hyperlipidemia: Secondary | ICD-10-CM | POA: Diagnosis not present

## 2024-04-09 DIAGNOSIS — M79605 Pain in left leg: Secondary | ICD-10-CM | POA: Diagnosis not present

## 2024-04-09 DIAGNOSIS — Z9181 History of falling: Secondary | ICD-10-CM | POA: Diagnosis not present

## 2024-04-09 DIAGNOSIS — N3001 Acute cystitis with hematuria: Secondary | ICD-10-CM | POA: Diagnosis not present

## 2024-04-09 DIAGNOSIS — R112 Nausea with vomiting, unspecified: Secondary | ICD-10-CM | POA: Diagnosis not present

## 2024-04-09 NOTE — Telephone Encounter (Signed)
 Unable to make further attempts to reach patients granddaughter as patients message today requests we do not involve her.

## 2024-04-11 DIAGNOSIS — N3001 Acute cystitis with hematuria: Secondary | ICD-10-CM | POA: Diagnosis not present

## 2024-04-11 DIAGNOSIS — M79605 Pain in left leg: Secondary | ICD-10-CM | POA: Diagnosis not present

## 2024-04-11 DIAGNOSIS — E782 Mixed hyperlipidemia: Secondary | ICD-10-CM | POA: Diagnosis not present

## 2024-04-11 DIAGNOSIS — Z9181 History of falling: Secondary | ICD-10-CM | POA: Diagnosis not present

## 2024-04-11 DIAGNOSIS — R112 Nausea with vomiting, unspecified: Secondary | ICD-10-CM | POA: Diagnosis not present

## 2024-04-14 DIAGNOSIS — R112 Nausea with vomiting, unspecified: Secondary | ICD-10-CM | POA: Diagnosis not present

## 2024-04-14 DIAGNOSIS — Z9181 History of falling: Secondary | ICD-10-CM | POA: Diagnosis not present

## 2024-04-14 DIAGNOSIS — M79605 Pain in left leg: Secondary | ICD-10-CM | POA: Diagnosis not present

## 2024-04-14 DIAGNOSIS — E782 Mixed hyperlipidemia: Secondary | ICD-10-CM | POA: Diagnosis not present

## 2024-04-14 DIAGNOSIS — N3001 Acute cystitis with hematuria: Secondary | ICD-10-CM | POA: Diagnosis not present

## 2024-04-14 DIAGNOSIS — F32A Depression, unspecified: Secondary | ICD-10-CM | POA: Diagnosis not present

## 2024-04-22 ENCOUNTER — Ambulatory Visit: Admitting: Family Medicine

## 2024-04-25 DIAGNOSIS — E782 Mixed hyperlipidemia: Secondary | ICD-10-CM | POA: Diagnosis not present

## 2024-04-25 DIAGNOSIS — N3001 Acute cystitis with hematuria: Secondary | ICD-10-CM | POA: Diagnosis not present

## 2024-04-25 DIAGNOSIS — M79605 Pain in left leg: Secondary | ICD-10-CM | POA: Diagnosis not present

## 2024-04-25 DIAGNOSIS — Z9181 History of falling: Secondary | ICD-10-CM | POA: Diagnosis not present

## 2024-04-25 DIAGNOSIS — R112 Nausea with vomiting, unspecified: Secondary | ICD-10-CM | POA: Diagnosis not present

## 2024-05-01 DIAGNOSIS — N3001 Acute cystitis with hematuria: Secondary | ICD-10-CM | POA: Diagnosis not present

## 2024-05-06 ENCOUNTER — Ambulatory Visit: Admitting: General Surgery

## 2024-05-06 DIAGNOSIS — Z9181 History of falling: Secondary | ICD-10-CM | POA: Diagnosis not present

## 2024-05-06 DIAGNOSIS — M79605 Pain in left leg: Secondary | ICD-10-CM | POA: Diagnosis not present

## 2024-05-06 DIAGNOSIS — E782 Mixed hyperlipidemia: Secondary | ICD-10-CM | POA: Diagnosis not present

## 2024-05-06 DIAGNOSIS — N3001 Acute cystitis with hematuria: Secondary | ICD-10-CM | POA: Diagnosis not present

## 2024-05-06 DIAGNOSIS — R112 Nausea with vomiting, unspecified: Secondary | ICD-10-CM | POA: Diagnosis not present

## 2024-05-15 DIAGNOSIS — Z9181 History of falling: Secondary | ICD-10-CM | POA: Diagnosis not present

## 2024-05-15 DIAGNOSIS — R112 Nausea with vomiting, unspecified: Secondary | ICD-10-CM | POA: Diagnosis not present

## 2024-05-15 DIAGNOSIS — M79605 Pain in left leg: Secondary | ICD-10-CM | POA: Diagnosis not present

## 2024-05-15 DIAGNOSIS — E782 Mixed hyperlipidemia: Secondary | ICD-10-CM | POA: Diagnosis not present

## 2024-05-15 DIAGNOSIS — N3001 Acute cystitis with hematuria: Secondary | ICD-10-CM | POA: Diagnosis not present

## 2024-05-20 DIAGNOSIS — M79605 Pain in left leg: Secondary | ICD-10-CM | POA: Diagnosis not present

## 2024-05-20 DIAGNOSIS — E782 Mixed hyperlipidemia: Secondary | ICD-10-CM | POA: Diagnosis not present

## 2024-05-20 DIAGNOSIS — Z9181 History of falling: Secondary | ICD-10-CM | POA: Diagnosis not present

## 2024-05-20 DIAGNOSIS — N3001 Acute cystitis with hematuria: Secondary | ICD-10-CM | POA: Diagnosis not present

## 2024-05-20 DIAGNOSIS — R112 Nausea with vomiting, unspecified: Secondary | ICD-10-CM | POA: Diagnosis not present

## 2024-05-22 DIAGNOSIS — M79605 Pain in left leg: Secondary | ICD-10-CM | POA: Diagnosis not present

## 2024-05-22 DIAGNOSIS — N3001 Acute cystitis with hematuria: Secondary | ICD-10-CM | POA: Diagnosis not present

## 2024-05-22 DIAGNOSIS — E782 Mixed hyperlipidemia: Secondary | ICD-10-CM | POA: Diagnosis not present

## 2024-05-22 DIAGNOSIS — R112 Nausea with vomiting, unspecified: Secondary | ICD-10-CM | POA: Diagnosis not present

## 2024-05-22 DIAGNOSIS — Z9181 History of falling: Secondary | ICD-10-CM | POA: Diagnosis not present

## 2024-05-27 ENCOUNTER — Ambulatory Visit: Admitting: General Surgery

## 2024-05-29 DIAGNOSIS — M79605 Pain in left leg: Secondary | ICD-10-CM | POA: Diagnosis not present

## 2024-05-29 DIAGNOSIS — E782 Mixed hyperlipidemia: Secondary | ICD-10-CM | POA: Diagnosis not present

## 2024-05-29 DIAGNOSIS — Z9181 History of falling: Secondary | ICD-10-CM | POA: Diagnosis not present

## 2024-05-29 DIAGNOSIS — R112 Nausea with vomiting, unspecified: Secondary | ICD-10-CM | POA: Diagnosis not present

## 2024-05-29 DIAGNOSIS — N3001 Acute cystitis with hematuria: Secondary | ICD-10-CM | POA: Diagnosis not present

## 2024-05-30 DIAGNOSIS — N3001 Acute cystitis with hematuria: Secondary | ICD-10-CM | POA: Diagnosis not present

## 2024-05-30 DIAGNOSIS — R112 Nausea with vomiting, unspecified: Secondary | ICD-10-CM | POA: Diagnosis not present

## 2024-05-30 DIAGNOSIS — Z9181 History of falling: Secondary | ICD-10-CM | POA: Diagnosis not present

## 2024-05-30 DIAGNOSIS — F32A Depression, unspecified: Secondary | ICD-10-CM | POA: Diagnosis not present

## 2024-05-30 DIAGNOSIS — M79605 Pain in left leg: Secondary | ICD-10-CM | POA: Diagnosis not present

## 2024-05-30 DIAGNOSIS — E782 Mixed hyperlipidemia: Secondary | ICD-10-CM | POA: Diagnosis not present

## 2024-06-03 DIAGNOSIS — E782 Mixed hyperlipidemia: Secondary | ICD-10-CM | POA: Diagnosis not present

## 2024-06-03 DIAGNOSIS — N3001 Acute cystitis with hematuria: Secondary | ICD-10-CM | POA: Diagnosis not present

## 2024-06-03 DIAGNOSIS — R112 Nausea with vomiting, unspecified: Secondary | ICD-10-CM | POA: Diagnosis not present

## 2024-06-03 DIAGNOSIS — M79605 Pain in left leg: Secondary | ICD-10-CM | POA: Diagnosis not present

## 2024-06-03 DIAGNOSIS — Z9181 History of falling: Secondary | ICD-10-CM | POA: Diagnosis not present

## 2024-06-06 ENCOUNTER — Encounter: Payer: Self-pay | Admitting: Family Medicine

## 2024-06-06 ENCOUNTER — Ambulatory Visit (INDEPENDENT_AMBULATORY_CARE_PROVIDER_SITE_OTHER): Admitting: Family Medicine

## 2024-06-06 VITALS — BP 135/78 | HR 81 | Temp 97.3°F | Ht 63.0 in | Wt 138.0 lb

## 2024-06-06 DIAGNOSIS — D500 Iron deficiency anemia secondary to blood loss (chronic): Secondary | ICD-10-CM | POA: Diagnosis not present

## 2024-06-06 DIAGNOSIS — F339 Major depressive disorder, recurrent, unspecified: Secondary | ICD-10-CM

## 2024-06-06 DIAGNOSIS — R300 Dysuria: Secondary | ICD-10-CM | POA: Diagnosis not present

## 2024-06-06 DIAGNOSIS — K2900 Acute gastritis without bleeding: Secondary | ICD-10-CM | POA: Diagnosis not present

## 2024-06-06 DIAGNOSIS — Z23 Encounter for immunization: Secondary | ICD-10-CM

## 2024-06-06 DIAGNOSIS — F419 Anxiety disorder, unspecified: Secondary | ICD-10-CM

## 2024-06-06 LAB — URINALYSIS, ROUTINE W REFLEX MICROSCOPIC
Bilirubin, UA: NEGATIVE
Glucose, UA: NEGATIVE
Ketones, UA: NEGATIVE
Nitrite, UA: NEGATIVE
Protein,UA: NEGATIVE
Specific Gravity, UA: 1.02 (ref 1.005–1.030)
Urobilinogen, Ur: 0.2 mg/dL (ref 0.2–1.0)
pH, UA: 7 (ref 5.0–7.5)

## 2024-06-06 LAB — MICROSCOPIC EXAMINATION: Bacteria, UA: NONE SEEN

## 2024-06-06 MED ORDER — FLUOXETINE HCL 10 MG PO CAPS: 10.0000 mg | ORAL_CAPSULE | Freq: Every day | ORAL | 1 refills | Status: DC

## 2024-06-06 MED ORDER — OMEPRAZOLE 20 MG PO CPDR: 20.0000 mg | DELAYED_RELEASE_CAPSULE | Freq: Every day | ORAL | 0 refills | Status: AC

## 2024-06-06 MED ORDER — SUCRALFATE 1 G PO TABS: 1.0000 g | ORAL_TABLET | Freq: Three times a day (TID) | ORAL | 1 refills | Status: DC

## 2024-06-06 NOTE — Progress Notes (Signed)
 BP 135/78   Pulse 81   Temp (!) 97.3 F (36.3 C) (Oral)   Ht 5' 3 (1.6 m)   Wt 138 lb (62.6 kg)   LMP  (LMP Unknown)   SpO2 98%   BMI 24.45 kg/m    Subjective:    Patient ID: Renee Bridges, female    DOB: Oct 24, 1945, 78 y.o.   MRN: [id number]  HPI: Renee Bridges is a 78 y.o. female  Chief Complaint  Patient presents with   Emesis   Fatigue   Abdominal Pain   Anxiety   Edema    Both feet   Renee Bridges was able to see GI after last visit. She was sent to the ER and admitted for a EGD and IV iron. EGD showed small hiatal hernia but was otherwise unremarkable. Biopsies from her EGD showed gastritis. She notes that she is still throwing up. She has lost an additional 5 pounds since last time. She states that she is taking her omeprazole  daily but still vomiting and having abdominal pain. She has not gone back to see GI and she did not go see hematology after her last visit.   DEPRESSION- feeling bad again. Notes that her family is not texting her back. Tearful. Mood status: exacerbated Satisfied with current treatment?: no Symptom severity: moderate  Duration of current treatment : months Side effects: no Medication compliance: excellent compliance Psychotherapy/counseling: no  Previous psychiatric medications: prozac , seroquel  Depressed mood: yes Anxious mood: yes Anhedonia: no Significant weight loss or gain: yes Insomnia: no  Fatigue: yes Feelings of worthlessness or guilt: yes Impaired concentration/indecisiveness: no Suicidal ideations: no Hopelessness: no Crying spells: yes    06/06/2024    1:56 PM 03/20/2024   11:19 AM 03/05/2024   10:58 AM 01/08/2024   11:36 AM 01/04/2024   11:31 AM  Depression screen PHQ 2/9  Decreased Interest 0 3 0 3 0  Down, Depressed, Hopeless 3 0 0 0 0  PHQ - 2 Score 3 3 0 3 0  Altered sleeping 3 3 1 3 3   Tired, decreased energy 3 3 1 2 3   Change in appetite 3 3 1 1  0  Feeling bad or failure about yourself  0 0 0 0 0  Trouble  concentrating 0 0 0 0 0  Moving slowly or fidgety/restless 0 3 1 0 3  Suicidal thoughts 0 0  0 0  PHQ-9 Score 12 15 4 9 9   Difficult doing work/chores  Somewhat difficult  Somewhat difficult     Relevant past medical, surgical, family and social history reviewed and updated as indicated. Interim medical history since our last visit reviewed. Allergies and medications reviewed and updated.  Review of Systems  Constitutional: Negative.   Respiratory: Negative.    Cardiovascular: Negative.   Gastrointestinal:  Positive for abdominal pain, nausea and vomiting. Negative for abdominal distention, anal bleeding, blood in stool, constipation, diarrhea and rectal pain.  Musculoskeletal: Negative.   Skin: Negative.   Neurological: Negative.   Psychiatric/Behavioral:  Positive for dysphoric mood. Negative for agitation, behavioral problems, confusion, decreased concentration, hallucinations, self-injury, sleep disturbance and suicidal ideas. The patient is nervous/anxious. The patient is not hyperactive.     Per HPI unless specifically indicated above     Objective:    BP 135/78   Pulse 81   Temp (!) 97.3 F (36.3 C) (Oral)   Ht 5' 3 (1.6 m)   Wt 138 lb (62.6 kg)   LMP  (LMP Unknown)  SpO2 98%   BMI 24.45 kg/m   Wt Readings from Last 3 Encounters:  06/06/24 138 lb (62.6 kg)  03/27/24 143 lb (64.9 kg)  03/20/24 143 lb 6.4 oz (65 kg)    Physical Exam Vitals and nursing note reviewed.  Constitutional:      General: She is not in acute distress.    Appearance: Normal appearance. She is not ill-appearing, toxic-appearing or diaphoretic.  HENT:     Head: Normocephalic and atraumatic.     Right Ear: External ear normal.     Left Ear: External ear normal.     Nose: Nose normal.     Mouth/Throat:     Mouth: Mucous membranes are moist.     Pharynx: Oropharynx is clear.  Eyes:     General: No scleral icterus.       Right eye: No discharge.        Left eye: No discharge.      Extraocular Movements: Extraocular movements intact.     Conjunctiva/sclera: Conjunctivae normal.     Pupils: Pupils are equal, round, and reactive to light.  Cardiovascular:     Rate and Rhythm: Normal rate and regular rhythm.     Pulses: Normal pulses.     Heart sounds: Normal heart sounds. No murmur heard.    No friction rub. No gallop.  Pulmonary:     Effort: Pulmonary effort is normal. No respiratory distress.     Breath sounds: Normal breath sounds. No stridor. No wheezing, rhonchi or rales.  Chest:     Chest wall: No tenderness.  Musculoskeletal:        General: Normal range of motion.     Cervical back: Normal range of motion and neck supple.  Skin:    General: Skin is warm and dry.     Capillary Refill: Capillary refill takes less than 2 seconds.     Coloration: Skin is not jaundiced or pale.     Findings: No bruising, erythema, lesion or rash.  Neurological:     General: No focal deficit present.     Mental Status: She is alert and oriented to person, place, and time. Mental status is at baseline.  Psychiatric:        Mood and Affect: Mood is anxious and depressed.        Behavior: Behavior normal.        Thought Content: Thought content normal.        Judgment: Judgment normal.     Results for orders placed or performed during the hospital encounter of 03/27/24  Urinalysis, Routine w reflex microscopic -Urine, Clean Catch   Collection Time: 03/27/24 11:42 AM  Result Value Ref Range   Color, Urine YELLOW (A) YELLOW   APPearance CLEAR (A) CLEAR   Specific Gravity, Urine 1.003 (L) 1.005 - 1.030   pH 6.0 5.0 - 8.0   Glucose, UA NEGATIVE NEGATIVE mg/dL   Hgb urine dipstick SMALL (A) NEGATIVE   Bilirubin Urine NEGATIVE NEGATIVE   Ketones, ur NEGATIVE NEGATIVE mg/dL   Protein, ur NEGATIVE NEGATIVE mg/dL   Nitrite NEGATIVE NEGATIVE   Leukocytes,Ua NEGATIVE NEGATIVE   RBC / HPF 0-5 0 - 5 RBC/hpf   WBC, UA 0-5 0 - 5 WBC/hpf   Bacteria, UA RARE (A) NONE SEEN    Squamous Epithelial / HPF 0-5 0 - 5 /HPF  Lipase, blood   Collection Time: 03/27/24 11:43 AM  Result Value Ref Range   Lipase 44 11 - 51 U/L  Comprehensive metabolic panel   Collection Time: 03/27/24 11:43 AM  Result Value Ref Range   Sodium 139 135 - 145 mmol/L   Potassium 4.3 3.5 - 5.1 mmol/L   Chloride 105 98 - 111 mmol/L   CO2 23 22 - 32 mmol/L   Glucose, Bld 118 (H) 70 - 99 mg/dL   BUN 11 8 - 23 mg/dL   Creatinine, Ser 8.93 (H) 0.44 - 1.00 mg/dL   Calcium  9.1 8.9 - 10.3 mg/dL   Total Protein 6.6 6.5 - 8.1 g/dL   Albumin 3.3 (L) 3.5 - 5.0 g/dL   AST 18 15 - 41 U/L   ALT 12 0 - 44 U/L   Alkaline Phosphatase 59 38 - 126 U/L   Total Bilirubin 0.7 0.0 - 1.2 mg/dL   GFR, Estimated 53 (L) >60 mL/min   Anion gap 11 5 - 15  CBC   Collection Time: 03/27/24 11:43 AM  Result Value Ref Range   WBC 9.0 4.0 - 10.5 K/uL   RBC 3.88 3.87 - 5.11 MIL/uL   Hemoglobin 9.7 (L) 12.0 - 15.0 g/dL   HCT 67.2 (L) 63.9 - 53.9 %   MCV 84.3 80.0 - 100.0 fL   MCH 25.0 (L) 26.0 - 34.0 pg   MCHC 29.7 (L) 30.0 - 36.0 g/dL   RDW 85.7 88.4 - 84.4 %   Platelets 303 150 - 400 K/uL   nRBC 0.0 0.0 - 0.2 %  Surgical pathology   Collection Time: 03/28/24 12:00 AM  Result Value Ref Range   SURGICAL PATHOLOGY      SURGICAL PATHOLOGY [facility] [address] [place], [place] [address] Telephone [phone] or [phone] Fax [phone]  REPORT OF SURGICAL PATHOLOGY   Accession #: [id number] Patient Name: BENIGNA, DELISI Visit # : [id number]  MRN: [id number] Physician: Jinny Carmine DOB/Age [birth year] (Age: 18) Gender: F Collected Date: 03/28/2024 Received Date: 03/28/2024  FINAL DIAGNOSIS       1. Stomach- Antrum, cbx :       - OXYNTIC GASTRIC MUCOSA WITH FOCAL CHRONIC INFLAMMATION.  NEGATIVE FOR      HELICOBACTER PYLORI ON H&E EVALUATION.  NEGATIVE FOR INTESTINAL METAPLASIA,      DYSPLASIA, OR MALIGNANCY.       DATE SIGNED OUT: 03/31/2024 ELECTRONIC  SIGNATURE : [place] Md, Mark, Pathologist, Electronic Signature  MICROSCOPIC DESCRIPTION  CASE COMMENTS STAINS USED IN DIAGNOSIS: H&E    CLINICAL HISTORY  SPECIMEN(S) OBTAINED 1. Stomach- Antrum, Cbx  SPECIMEN COMMENTS: SPECIMEN CLINICAL INFORMATION: 1. Upper GI bleed, nor mal EGD    Gross Description 1. Received in formalin are tan, soft tissue fragments that are submitted in toto.  Number:  two  Size:  0.4 and 0.5 cm,  (1 B)  (SSW:kh 03/28/24)        Report signed out from the following location(s) [facility] [address] [place], [place] [address] CLIA #: [id number]  [facility] [address] [place], [place] [address] CLIA #: [id number]   Basic metabolic panel   Collection Time: 03/28/24  5:27 AM  Result Value Ref Range   Sodium 143 135 - 145 mmol/L   Potassium 4.0 3.5 - 5.1 mmol/L   Chloride 110 98 - 111 mmol/L   CO2 24 22 - 32 mmol/L   Glucose, Bld 98 70 - 99 mg/dL   BUN 9 8 - 23 mg/dL   Creatinine, Ser 8.99 0.44 - 1.00 mg/dL  Calcium  8.4 (L) 8.9 - 10.3 mg/dL   GFR, Estimated 57 (L) >60 mL/min   Anion gap 9 5 - 15      Assessment & Plan:   Problem List Items Addressed This Visit       Digestive   Acute gastritis   Still vomiting and having abdominal pain. EGD showed gastritis. Will continue omeprazole , start carafate and get her back into see GI. Follow up 6 weeks.       Relevant Orders   Comprehensive metabolic panel with GFR     Other   Anxiety   Not doing well. Will restart her prozac . Call with any concerns. Follow up 1-2 months.       Relevant Medications   FLUoxetine  (PROZAC ) 10 MG capsule   Depression, recurrent   Not doing well. Will restart her prozac . Call with any concerns. Follow up 1-2 months.       Relevant Medications   FLUoxetine  (PROZAC ) 10 MG capsule   Iron deficiency anemia due to chronic blood loss - Primary   Did not get iron infusions at the hospital and has not seen  hematology. Will recheck labs and get her into hematology. Await results.       Relevant Orders   Comprehensive metabolic panel with GFR   CBC with Differential/Platelet   Ferritin   Iron Binding Cap (TIBC)([facility])   Ambulatory referral to Hematology / Oncology   Other Visit Diagnoses       Burning with urination       Rechecking urine today.   Relevant Orders   Comprehensive metabolic panel with GFR   Urinalysis, Routine w reflex microscopic   Urine Culture     Flu vaccine need       Flu shot given today.   Relevant Orders   Flu vaccine HIGH DOSE PF(Fluzone Trivalent)        Follow up plan: Return in about 4 weeks (around 07/04/2024).

## 2024-06-06 NOTE — Assessment & Plan Note (Signed)
 Still vomiting and having abdominal pain. EGD showed gastritis. Will continue omeprazole , start carafate and get her back into see GI. Follow up 6 weeks.

## 2024-06-06 NOTE — Assessment & Plan Note (Signed)
 Not doing well. Will restart her prozac . Call with any concerns. Follow up 1-2 months.

## 2024-06-06 NOTE — Assessment & Plan Note (Signed)
 Did not get iron infusions at the hospital and has not seen hematology. Will recheck labs and get her into hematology. Await results.

## 2024-06-07 LAB — CBC WITH DIFFERENTIAL/PLATELET
Basophils Absolute: 0.1 x10E3/uL (ref 0.0–0.2)
Basos: 1 %
EOS (ABSOLUTE): 0.1 x10E3/uL (ref 0.0–0.4)
Eos: 1 %
Hematocrit: 27.6 % — ABNORMAL LOW (ref 34.0–46.6)
Hemoglobin: 7.7 g/dL — ABNORMAL LOW (ref 11.1–15.9)
Immature Grans (Abs): 0 x10E3/uL (ref 0.0–0.1)
Immature Granulocytes: 0 %
Lymphocytes Absolute: 3.2 x10E3/uL — ABNORMAL HIGH (ref 0.7–3.1)
Lymphs: 31 %
MCH: 21.9 pg — ABNORMAL LOW (ref 26.6–33.0)
MCHC: 27.9 g/dL — ABNORMAL LOW (ref 31.5–35.7)
MCV: 78 fL — ABNORMAL LOW (ref 79–97)
Monocytes Absolute: 0.9 x10E3/uL (ref 0.1–0.9)
Monocytes: 9 %
Neutrophils Absolute: 6 x10E3/uL (ref 1.4–7.0)
Neutrophils: 58 %
Platelets: 319 x10E3/uL (ref 150–450)
RBC: 3.52 x10E6/uL — ABNORMAL LOW (ref 3.77–5.28)
RDW: 14.4 % (ref 11.7–15.4)
WBC: 10.3 x10E3/uL (ref 3.4–10.8)

## 2024-06-07 LAB — COMPREHENSIVE METABOLIC PANEL WITH GFR
ALT: 12 IU/L (ref 0–32)
AST: 17 IU/L (ref 0–40)
Albumin: 3.7 g/dL — ABNORMAL LOW (ref 3.8–4.8)
Alkaline Phosphatase: 77 IU/L (ref 49–135)
BUN/Creatinine Ratio: 15 (ref 12–28)
BUN: 15 mg/dL (ref 8–27)
Bilirubin Total: 0.3 mg/dL (ref 0.0–1.2)
CO2: 20 mmol/L (ref 20–29)
Calcium: 8.8 mg/dL (ref 8.7–10.3)
Chloride: 104 mmol/L (ref 96–106)
Creatinine, Ser: 0.98 mg/dL (ref 0.57–1.00)
Globulin, Total: 2.6 g/dL (ref 1.5–4.5)
Glucose: 93 mg/dL (ref 70–99)
Potassium: 4.6 mmol/L (ref 3.5–5.2)
Sodium: 139 mmol/L (ref 134–144)
Total Protein: 6.3 g/dL (ref 6.0–8.5)
eGFR: 58 mL/min/1.73 — ABNORMAL LOW (ref >59)

## 2024-06-07 LAB — IRON AND TIBC
Iron Saturation: 3 % — CL (ref 15–55)
Iron: 11 ug/dL — ABNORMAL LOW (ref 27–139)
Total Iron Binding Capacity: 342 ug/dL (ref 250–450)
UIBC: 331 ug/dL (ref 118–369)

## 2024-06-07 LAB — FERRITIN: Ferritin: 7 ng/mL — ABNORMAL LOW (ref 15–150)

## 2024-06-09 ENCOUNTER — Ambulatory Visit: Payer: Self-pay | Admitting: Family Medicine

## 2024-06-10 ENCOUNTER — Encounter: Payer: Self-pay | Admitting: General Surgery

## 2024-06-10 ENCOUNTER — Other Ambulatory Visit: Payer: Self-pay

## 2024-06-10 ENCOUNTER — Ambulatory Visit: Admitting: General Surgery

## 2024-06-10 VITALS — BP 128/65 | HR 79 | Ht 63.0 in | Wt 136.0 lb

## 2024-06-10 DIAGNOSIS — R222 Localized swelling, mass and lump, trunk: Secondary | ICD-10-CM

## 2024-06-10 DIAGNOSIS — R1900 Intra-abdominal and pelvic swelling, mass and lump, unspecified site: Secondary | ICD-10-CM

## 2024-06-10 DIAGNOSIS — R1907 Generalized intra-abdominal and pelvic swelling, mass and lump: Secondary | ICD-10-CM

## 2024-06-10 NOTE — Patient Instructions (Signed)
 We recommend having a biopsy of this mass to be sure what it is.  They will call you to schedule this.    We will have you follow up here after we have these results back.

## 2024-06-11 LAB — URINE CULTURE

## 2024-06-12 ENCOUNTER — Encounter: Payer: Self-pay | Admitting: General Surgery

## 2024-06-12 NOTE — Progress Notes (Signed)
 Renee Bridges POUR, [name] This lesion is benign, I spoke to Dr. Marinda.  Please cancel biopsy.  HKM

## 2024-06-13 MED ORDER — AMOXICILLIN 500 MG PO CAPS: 500.0000 mg | ORAL_CAPSULE | Freq: Two times a day (BID) | ORAL | 0 refills | Status: AC

## 2024-06-18 ENCOUNTER — Ambulatory Visit: Payer: Self-pay

## 2024-06-18 DIAGNOSIS — R112 Nausea with vomiting, unspecified: Secondary | ICD-10-CM | POA: Diagnosis not present

## 2024-06-18 DIAGNOSIS — M79605 Pain in left leg: Secondary | ICD-10-CM | POA: Diagnosis not present

## 2024-06-18 DIAGNOSIS — N3001 Acute cystitis with hematuria: Secondary | ICD-10-CM | POA: Diagnosis not present

## 2024-06-18 DIAGNOSIS — Z9181 History of falling: Secondary | ICD-10-CM | POA: Diagnosis not present

## 2024-06-18 DIAGNOSIS — E782 Mixed hyperlipidemia: Secondary | ICD-10-CM | POA: Diagnosis not present

## 2024-06-18 NOTE — Telephone Encounter (Addendum)
 ERROR

## 2024-06-18 NOTE — Telephone Encounter (Signed)
 FYI Only or Action Required?: FYI only for provider.  Patient was last seen in primary care on 06/06/2024 by Vicci Bouchard P, DO.  Called Nurse Triage reporting Leg Swelling.  Symptoms began several days ago.  Interventions attempted: Ice/heat application.  Symptoms are: unchanged.  Triage Disposition: See Within 3 Days in Office (overriding See Physician Within 24 Hours)  Patient/caregiver understands and will follow disposition?: Yes  Copied from CRM #[id number]. Topic: Clinical - Red Word Triage >> Jun 18, 2024 11:12 AM Terri MATSU wrote: Red Word that prompted transfer to Nurse Triage: Patient think she may be having an allergic reaction to her medication. She broke out on her back and experiencing swelling in the legs. Reason for Disposition  [1] MODERATE leg swelling (e.g., swelling extends up to knees) AND [2] new-onset or getting worse  Answer Assessment - Initial Assessment Questions Darold, PTA with [facility] called to report the patient reported to him of having a rash on her shoulders/chest as well leg swelling. Patient states the swelling started a few days ago, tight, no pain, no redness. Advised she will need to be seen in the office this week. She says she has to set up transportation and it takes 3-4 days before the appointment. Advised no availability until November, so I called [facility] and appointment was scheduled for Monday 06/23/24 at 1120 with Jolene. [facility] called with the patient on the line, transportation set up to pick patient up on Monday, 06/23/24 at 1026 am at her home and pickup at the office at 1215 pm. Patient called back to give that information because she was standing and said she needed to sit down before she fall. Patient called back and she says she's better now, a little leg pain, verbalized understanding of transportation, advised a text was sent to her cell phone with the details by [facility]. She verbalized understanding.     1.  ONSET: When did the swelling start? (e.g., minutes, hours, days)     3-4 days ago 2. LOCATION: What part of the leg is swollen?  Are both legs swollen or just one leg?     Both legs lower  3. SEVERITY: How bad is the swelling? (e.g., localized; mild, moderate, severe)     Moderate-severe 4. REDNESS: Is there redness or signs of infection?     Yes on the right leg a little red spot 5. PAIN: Is the swelling painful to touch? If Yes, ask: How painful is it?   (Scale 1-10; mild, moderate or severe)     No pain at this time, just tightness 6. FEVER: Do you have a fever? If Yes, ask: What is it, how was it measured, and when did it start?      No 7. CAUSE: What do you think is causing the leg swelling?     Not sure 8. MEDICAL HISTORY: Do you have a history of blood clots (e.g., DVT), cancer, heart failure, kidney disease, or liver failure?     N/A 9. RECURRENT SYMPTOM: Have you had leg swelling before? If Yes, ask: When was the last time? What happened that time?     Yes first time 10. OTHER SYMPTOMS: Do you have any other symptoms? (e.g., chest pain, difficulty breathing)     A little SOB w/activity cleaning up the home  Answer Assessment - Initial Assessment Questions 1. APPEARANCE of RASH: What does the rash look like? (e.g., blisters, dry flaky skin, red spots, redness, sores)     Red  areas 2. LOCATION: Where is the rash located?      Across chest near the shoulder 3. NUMBER: How many spots are there?      Multiple 4. SIZE: How big are the spots? (e.g., inches, cm; or compare to size of pinhead, tip of pen, eraser, pea)      Elongated and round 5. ONSET: When did the rash start?      Reported since starting on all the medication Dr. Vicci put her on 6. ITCHING: Does the rash itch? If Yes, ask: How bad is the itch?  (Scale 0-10; or none, mild, moderate, severe)     Yes  Protocols used: Leg Swelling and Edema-A-AH, Rash or Redness -  Localized-A-AH

## 2024-06-22 NOTE — Patient Instructions (Incomplete)
 Edema  Edema is when you have too much fluid in your body or under your skin. Edema may make your legs, feet, and ankles swell. Swelling often happens in looser tissues, such as around your eyes. This is a common condition. It gets more common as you get older. There are many possible causes of edema. These include: Eating too much salt (sodium). Being on your feet or sitting for a long time. Certain medical conditions, such as: Pregnancy. Heart failure. Liver disease. Kidney disease. Cancer. Hot weather may make edema worse. Edema is usually painless. Your skin may look swollen or shiny. Follow these instructions at home: Medicines Take over-the-counter and prescription medicines only as told by your doctor. Your doctor may prescribe a medicine to help your body get rid of extra water (diuretic). Take this medicine if you are told to take it. Eating and drinking Eat a low-salt (low-sodium) diet as told by your doctor. Sometimes, eating less salt may reduce swelling. Depending on the cause of your swelling, you may need to limit how much fluid you drink (fluid restriction). General instructions Raise the injured area above the level of your heart while you are sitting or lying down. Do not sit still or stand for a long time. Do not wear tight clothes. Do not wear garters on your upper legs. Exercise your legs. This can help the swelling go down. Wear compression stockings as told by your doctor. It is important that these are the right size. These should be prescribed by your doctor to prevent possible injuries. If elastic bandages or wraps are recommended, use them as told by your doctor. Contact a doctor if: Treatment is not working. You have heart, liver, or kidney disease and have symptoms of edema. You have sudden and unexplained weight gain. Get help right away if: You have shortness of breath or chest pain. You cannot breathe when you lie down. You have pain, redness, or  warmth in the swollen areas. You have heart, liver, or kidney disease and get edema all of a sudden. You have a fever and your symptoms get worse all of a sudden. These symptoms may be an emergency. Get help right away. Call 911. Do not wait to see if the symptoms will go away. Do not drive yourself to the hospital. Summary Edema is when you have too much fluid in your body or under your skin. Edema may make your legs, feet, and ankles swell. Swelling often happens in looser tissues, such as around your eyes. Raise the injured area above the level of your heart while you are sitting or lying down. Follow your doctor's instructions about diet and how much fluid you can drink. This information is not intended to replace advice given to you by your health care provider. Make sure you discuss any questions you have with your health care provider. Document Revised: 04/25/2021 Document Reviewed: 04/25/2021 Elsevier Patient Education  2024 [employer].

## 2024-06-23 ENCOUNTER — Encounter: Payer: Self-pay | Admitting: Nurse Practitioner

## 2024-06-23 ENCOUNTER — Ambulatory Visit (INDEPENDENT_AMBULATORY_CARE_PROVIDER_SITE_OTHER): Admitting: Nurse Practitioner

## 2024-06-23 VITALS — BP 117/69 | HR 80 | Temp 98.2°F | Resp 14 | Ht 62.99 in | Wt 135.6 lb

## 2024-06-23 DIAGNOSIS — M25472 Effusion, left ankle: Secondary | ICD-10-CM | POA: Insufficient documentation

## 2024-06-23 DIAGNOSIS — R21 Rash and other nonspecific skin eruption: Secondary | ICD-10-CM | POA: Diagnosis not present

## 2024-06-23 DIAGNOSIS — M25471 Effusion, right ankle: Secondary | ICD-10-CM

## 2024-06-23 MED ORDER — TRIAMCINOLONE ACETONIDE 0.1 % EX CREA
1.0000 | TOPICAL_CREAM | Freq: Two times a day (BID) | CUTANEOUS | 2 refills | Status: AC
Start: 2024-06-23 — End: ?

## 2024-06-23 MED ORDER — VALACYCLOVIR HCL 1 G PO TABS: 1000.0000 mg | ORAL_TABLET | Freq: Two times a day (BID) | ORAL | 0 refills | Status: AC

## 2024-06-23 NOTE — Progress Notes (Signed)
 BP 117/69 (BP Location: Left Arm, Patient Position: Sitting, Cuff Size: Normal)   Pulse 80   Temp 98.2 F (36.8 C) (Oral)   Resp 14   Ht 5' 2.99 (1.6 m)   Wt 135 lb 9.6 oz (61.5 kg)   LMP  (LMP Unknown)   SpO2 98%   BMI 24.03 kg/m    Subjective:    Patient ID: Renee Bridges, female    DOB: 1946-05-25, 78 y.o.   MRN: [id number]  HPI: Renee Bridges is a 78 y.o. female  Chief Complaint  Patient presents with   Edema    Has swelling in both feet, started a few weeks ago. Tried to manage at home but didn't feel it worked enough.    Rash    Around her neck and her back is covered and feels she is broken out. Very itchy and some are sore to the touch. Has to use a bath brush to help with the itching.    EDEMA: This started about one week ago, tried to manage at home. About the same to both legs. Feels like this is worse at night. When elevates legs it will go away. Started Gabapentin  in May, not recent. Has iron deficiency anemia newly noted, sees hematology tomorrow for labs and visit. Taking Carafate but hard to swallow and Omeprazole . Aspirin: no Recurrent headaches: no Visual changes: no Palpitations: no Dyspnea: at baseline Chest pain: no Lower extremity edema: yes Dizzy/lightheaded: no   RASH Started a few days ago, thinks it was Amoxicillin . Was treated on 06/09/24 for UTI.  Rash was generalized to back, abdomen, neck.  Duration:  weeks  Location: generalized  Itching: yes Burning: no Redness: yes Oozing: sometimes Scaling: no Blisters: yes Painful: sometimes Fevers: no Change in detergents/soaps/personal care products: no Recent illness: UTI Recent travel:no History of same: no Context: fluctuating Alleviating factors: nothing Treatments attempted:nothing Shortness of breath: at baseline Throat/tongue swelling: no Myalgias/arthralgias: no  Estimated Creatinine Clearance: 37.9 mL/min (by C-G formula based on SCr of 0.98 mg/dL).   Relevant past medical,  surgical, family and social history reviewed and updated as indicated. Interim medical history since our last visit reviewed. Allergies and medications reviewed and updated.  Review of Systems  Constitutional:  Negative for activity change, appetite change, diaphoresis, fatigue and fever.  Respiratory:  Negative for cough, chest tightness, shortness of breath and wheezing.   Cardiovascular:  Positive for leg swelling. Negative for chest pain and palpitations.  Gastrointestinal: Negative.   Skin:  Positive for rash.  Neurological: Negative.   Psychiatric/Behavioral: Negative.      Per HPI unless specifically indicated above     Objective:    BP 117/69 (BP Location: Left Arm, Patient Position: Sitting, Cuff Size: Normal)   Pulse 80   Temp 98.2 F (36.8 C) (Oral)   Resp 14   Ht 5' 2.99 (1.6 m)   Wt 135 lb 9.6 oz (61.5 kg)   LMP  (LMP Unknown)   SpO2 98%   BMI 24.03 kg/m   Wt Readings from Last 3 Encounters:  06/23/24 135 lb 9.6 oz (61.5 kg)  06/10/24 136 lb (61.7 kg)  06/06/24 138 lb (62.6 kg)    Physical Exam Vitals and nursing note reviewed.  Constitutional:      General: She is awake. She is not in acute distress.    Appearance: She is well-developed and well-groomed. She is not ill-appearing or toxic-appearing.  HENT:     Head: Normocephalic.  Right Ear: Hearing and external ear normal.     Left Ear: Hearing and external ear normal.  Eyes:     General: Lids are normal.        Right eye: No discharge.        Left eye: No discharge.     Conjunctiva/sclera: Conjunctivae normal.     Pupils: Pupils are equal, round, and reactive to light.  Neck:     Thyroid : No thyromegaly.     Vascular: No carotid bruit.  Cardiovascular:     Rate and Rhythm: Normal rate and regular rhythm.     Heart sounds: Normal heart sounds. No murmur heard.    No gallop.  Pulmonary:     Effort: Pulmonary effort is normal. No accessory muscle usage or respiratory distress.     Breath  sounds: Normal breath sounds.  Abdominal:     General: Bowel sounds are normal. There is no distension.     Palpations: Abdomen is soft.     Tenderness: There is no abdominal tenderness.  Musculoskeletal:     Cervical back: Normal range of motion and neck supple.     Right lower leg: 1+ Edema present.     Left lower leg: 1+ Edema present.  Lymphadenopathy:     Cervical: No cervical adenopathy.  Skin:    General: Skin is warm and dry.     Findings: Rash present.     Comments: Vesicular, cluster areas of rash noted to mid/lower back and along anterior chest.  A few spots around neck.  Neurological:     Mental Status: She is alert and oriented to person, place, and time.     Deep Tendon Reflexes: Reflexes are normal and symmetric.     Reflex Scores:      Brachioradialis reflexes are 2+ on the right side and 2+ on the left side.      Patellar reflexes are 2+ on the right side and 2+ on the left side. Psychiatric:        Attention and Perception: Attention normal.        Mood and Affect: Mood normal.        Speech: Speech normal.        Behavior: Behavior normal. Behavior is cooperative.        Thought Content: Thought content normal.     Results for orders placed or performed in visit on 06/06/24  Comprehensive metabolic panel with GFR   Collection Time: 06/06/24  2:18 PM  Result Value Ref Range   Glucose 93 70 - 99 mg/dL   BUN 15 8 - 27 mg/dL   Creatinine, Ser 9.01 0.57 - 1.00 mg/dL   eGFR 58 (L) >40 fO/fpw/8.26   BUN/Creatinine Ratio 15 12 - 28   Sodium 139 134 - 144 mmol/L   Potassium 4.6 3.5 - 5.2 mmol/L   Chloride 104 96 - 106 mmol/L   CO2 20 20 - 29 mmol/L   Calcium  8.8 8.7 - 10.3 mg/dL   Total Protein 6.3 6.0 - 8.5 g/dL   Albumin 3.7 (L) 3.8 - 4.8 g/dL   Globulin, Total 2.6 1.5 - 4.5 g/dL   Bilirubin Total 0.3 0.0 - 1.2 mg/dL   Alkaline Phosphatase 77 49 - 135 IU/L   AST 17 0 - 40 IU/L   ALT 12 0 - 32 IU/L  CBC with Differential/Platelet   Collection Time:  06/06/24  2:18 PM  Result Value Ref Range   WBC 10.3 3.4 - 10.8 x10E3/uL  RBC 3.52 (L) 3.77 - 5.28 x10E6/uL   Hemoglobin 7.7 (L) 11.1 - 15.9 g/dL   Hematocrit 72.3 (L) 65.9 - 46.6 %   MCV 78 (L) 79 - 97 fL   MCH 21.9 (L) 26.6 - 33.0 pg   MCHC 27.9 (L) 31.5 - 35.7 g/dL   RDW 85.5 88.2 - 84.5 %   Platelets 319 150 - 450 x10E3/uL   Neutrophils 58 Not Estab. %   Lymphs 31 Not Estab. %   Monocytes 9 Not Estab. %   Eos 1 Not Estab. %   Basos 1 Not Estab. %   Neutrophils Absolute 6.0 1.4 - 7.0 x10E3/uL   Lymphocytes Absolute 3.2 (H) 0.7 - 3.1 x10E3/uL   Monocytes Absolute 0.9 0.1 - 0.9 x10E3/uL   EOS (ABSOLUTE) 0.1 0.0 - 0.4 x10E3/uL   Basophils Absolute 0.1 0.0 - 0.2 x10E3/uL   Immature Granulocytes 0 Not Estab. %   Immature Grans (Abs) 0.0 0.0 - 0.1 x10E3/uL  Ferritin   Collection Time: 06/06/24  2:18 PM  Result Value Ref Range   Ferritin 7 (L) 15 - 150 ng/mL  Iron Binding Cap (TIBC)([facility])   Collection Time: 06/06/24  2:18 PM  Result Value Ref Range   Total Iron Binding Capacity 342 250 - 450 ug/dL   UIBC 668 881 - 630 ug/dL   Iron 11 (L) 27 - 860 ug/dL   Iron Saturation 3 (LL) 15 - 55 %  Urine Culture   Collection Time: 06/06/24  2:22 PM   Specimen: Urine   UR  Result Value Ref Range   Urine Culture, Routine Final report (A)    Organism ID, Bacteria Comment (A)   Microscopic Examination   Collection Time: 06/06/24  2:22 PM   Urine  Result Value Ref Range   WBC, UA 0-5 0 - 5 /hpf   RBC, Urine 0-2 0 - 2 /hpf   Epithelial Cells (non renal) 0-10 0 - 10 /hpf   Bacteria, UA None seen None seen/Few  Urinalysis, Routine w reflex microscopic   Collection Time: 06/06/24  2:22 PM  Result Value Ref Range   Specific Gravity, UA 1.020 1.005 - 1.030   pH, UA 7.0 5.0 - 7.5   Color, UA Yellow Yellow   Appearance Ur Clear Clear   Leukocytes,UA Trace (A) Negative   Protein,UA Negative Negative/Trace   Glucose, UA Negative Negative   Ketones, UA Negative Negative    RBC, UA Trace (A) Negative   Bilirubin, UA Negative Negative   Urobilinogen, Ur 0.2 0.2 - 1.0 mg/dL   Nitrite, UA Negative Negative   Microscopic Examination See below:       Assessment & Plan:   Problem List Items Addressed This Visit       Musculoskeletal and Integument   Rash - Primary   Acute over past few days.  Suspect shingles based on appearance and her description of discomfort.  Has not been vaccinated for this.  Will start renal dosed Valtrex based on her recent labs, 1000 MG Q12H. Triamcinolone  cream sent to apply to areas of rash as needed for comfort.  Educated her on shingles. To ensure to wash all clothing and bedding well + wash hands well after touching any rash areas.        Other   Ankle edema, bilateral   New onset over past weeks, suspect this may be related to her anemia.  She is having some SOB with this as well. Reports symptoms started after this diagnosis.  Denies CP or orthopnea. Recommend she continue her Omeprazole  and Carafate, discussed with her if needed can crush Carafate to make easier to swallow.  Sees hematology tomorrow, since will be getting labs then on review will defer labs today.  Have return to see PCP on November 11th as scheduled.       Time: 25 minutes, >50% spent counseling/or care coordination   Follow up plan: Return for as scheduled November 11th with Dr. Vicci.

## 2024-06-23 NOTE — Assessment & Plan Note (Signed)
 New onset over past weeks, suspect this may be related to her anemia.  She is having some SOB with this as well. Reports symptoms started after this diagnosis.  Denies CP or orthopnea. Recommend she continue her Omeprazole  and Carafate, discussed with her if needed can crush Carafate to make easier to swallow.  Sees hematology tomorrow, since will be getting labs then on review will defer labs today.  Have return to see PCP on November 11th as scheduled.

## 2024-06-23 NOTE — Assessment & Plan Note (Signed)
 Acute over past few days.  Suspect shingles based on appearance and her description of discomfort.  Has not been vaccinated for this.  Will start renal dosed Valtrex based on her recent labs, 1000 MG Q12H. Triamcinolone  cream sent to apply to areas of rash as needed for comfort.  Educated her on shingles. To ensure to wash all clothing and bedding well + wash hands well after touching any rash areas.

## 2024-06-24 ENCOUNTER — Encounter: Payer: Self-pay | Admitting: Oncology

## 2024-06-24 ENCOUNTER — Inpatient Hospital Stay: Attending: Oncology | Admitting: Oncology

## 2024-06-24 ENCOUNTER — Inpatient Hospital Stay

## 2024-06-24 VITALS — BP 110/68 | HR 96 | Temp 99.0°F | Resp 18 | Ht 62.0 in | Wt 132.0 lb

## 2024-06-24 DIAGNOSIS — D500 Iron deficiency anemia secondary to blood loss (chronic): Secondary | ICD-10-CM

## 2024-06-24 DIAGNOSIS — Z885 Allergy status to narcotic agent status: Secondary | ICD-10-CM | POA: Diagnosis not present

## 2024-06-24 DIAGNOSIS — Z96652 Presence of left artificial knee joint: Secondary | ICD-10-CM | POA: Insufficient documentation

## 2024-06-24 DIAGNOSIS — M1990 Unspecified osteoarthritis, unspecified site: Secondary | ICD-10-CM | POA: Diagnosis not present

## 2024-06-24 DIAGNOSIS — Z882 Allergy status to sulfonamides status: Secondary | ICD-10-CM | POA: Insufficient documentation

## 2024-06-24 DIAGNOSIS — Z79899 Other long term (current) drug therapy: Secondary | ICD-10-CM | POA: Diagnosis not present

## 2024-06-24 DIAGNOSIS — Z90710 Acquired absence of both cervix and uterus: Secondary | ICD-10-CM | POA: Insufficient documentation

## 2024-06-24 DIAGNOSIS — D509 Iron deficiency anemia, unspecified: Secondary | ICD-10-CM | POA: Diagnosis not present

## 2024-06-24 DIAGNOSIS — I10 Essential (primary) hypertension: Secondary | ICD-10-CM | POA: Diagnosis not present

## 2024-06-24 DIAGNOSIS — J45909 Unspecified asthma, uncomplicated: Secondary | ICD-10-CM | POA: Diagnosis not present

## 2024-06-24 DIAGNOSIS — Z833 Family history of diabetes mellitus: Secondary | ICD-10-CM | POA: Diagnosis not present

## 2024-06-24 DIAGNOSIS — R5382 Chronic fatigue, unspecified: Secondary | ICD-10-CM | POA: Diagnosis not present

## 2024-06-24 DIAGNOSIS — Z809 Family history of malignant neoplasm, unspecified: Secondary | ICD-10-CM | POA: Diagnosis not present

## 2024-06-24 DIAGNOSIS — F418 Other specified anxiety disorders: Secondary | ICD-10-CM | POA: Insufficient documentation

## 2024-06-24 LAB — VITAMIN B12: Vitamin B-12: 196 pg/mL (ref 180–914)

## 2024-06-24 LAB — CBC (CANCER CENTER ONLY)
HCT: 24.4 % — ABNORMAL LOW (ref 36.0–46.0)
Hemoglobin: 7.2 g/dL — ABNORMAL LOW (ref 12.0–15.0)
MCH: 21.6 pg — ABNORMAL LOW (ref 26.0–34.0)
MCHC: 29.5 g/dL — ABNORMAL LOW (ref 30.0–36.0)
MCV: 73.3 fL — ABNORMAL LOW (ref 80.0–100.0)
Platelet Count: 295 K/uL (ref 150–400)
RBC: 3.33 MIL/uL — ABNORMAL LOW (ref 3.87–5.11)
RDW: 16.4 % — ABNORMAL HIGH (ref 11.5–15.5)
WBC Count: 9.7 K/uL (ref 4.0–10.5)
nRBC: 0 % (ref 0.0–0.2)

## 2024-06-24 LAB — IRON AND TIBC
Iron: 14 ug/dL — ABNORMAL LOW (ref 28–170)
Saturation Ratios: 4 % — ABNORMAL LOW (ref 10.4–31.8)
TIBC: 384 ug/dL (ref 250–450)
UIBC: 370 ug/dL

## 2024-06-24 LAB — FERRITIN: Ferritin: <3 ng/mL — ABNORMAL LOW (ref 11–307)

## 2024-06-24 LAB — FOLATE: Folate: 12.6 ng/mL (ref >5.9)

## 2024-06-24 LAB — LACTATE DEHYDROGENASE: LDH: 150 U/L (ref 98–192)

## 2024-06-24 NOTE — Progress Notes (Unsigned)
 [facility]  [phone] [phone]  ID: Renee Bridges OB: 08-Aug-1946  MR#: [id number]  RDW#:[id number]  Patient Care Team: Vicci Duwaine SQUIBB, DO as PCP - General (Family Medicine) [name], MD as Consulting Physician (Oncology)  CHIEF COMPLAINT: ***  INTERVAL HISTORY: ***  REVIEW OF SYSTEMS:   ROS  As per HPI. Otherwise, a complete review of systems is negative.  PAST MEDICAL HISTORY: Past Medical History:  Diagnosis Date   Abdominal pain 03/27/2024   Abdominal pain, epigastric 03/28/2024   Anxiety    Arthritis    Asthma    Complication of surgical procedure 03/08/2018   Constipation 07/17/2016   Depression    Headache    History of hiatal hernia    Hypertension    Immobility 09/25/2016   Kidney cyst, acquired    Nausea and vomiting 03/28/2024   Seizures (HCC)    hx. as child   Senile purpura 04/16/2022   Shortness of breath dyspnea    with excertion    PAST SURGICAL HISTORY: Past Surgical History:  Procedure Laterality Date   ABDOMINAL HYSTERECTOMY     BACK SURGERY     colonoscopy a year ago     ESOPHAGOGASTRODUODENOSCOPY N/A 03/28/2024   Procedure: EGD (ESOPHAGOGASTRODUODENOSCOPY);  Surgeon: Jinny Carmine, MD;  Location: [facility] ENDOSCOPY;  Service: Endoscopy;  Laterality: N/A;   LEG SURGERY Left 03/2015   mass removed from back - 20 years ago     SPINAL CORD STIMULATOR INSERTION N/A 01/17/2017   Procedure: LUMBAR SPINAL CORD STIMULATOR INSERTION;  Surgeon: Burnetta Aures, MD;  Location: [facility] OR;  Service: Orthopedics;  Laterality: N/A;  Requests 3 hrs   TONSILLECTOMY     TOTAL KNEE REVISION Left 05/13/2015   Procedure: REVISION LEFT  KNEE ARTHROPLASTY, REVISION PATELLA POLY EXCHANGE;  Surgeon: Redell Shoals, MD;  Location: [facility] ORS;  Service: Orthopedics;  Laterality: Left;    FAMILY HISTORY: Family History  Problem Relation Age of Onset   Cancer Mother    Cancer Father    Diabetes Brother    Cancer Brother     Cancer Brother     ADVANCED DIRECTIVES (Y/N):  N  HEALTH MAINTENANCE: Social History   Tobacco Use   Smoking status: Never    Passive exposure: Past   Smokeless tobacco: Never  Vaping Use   Vaping status: Never Used  Substance Use Topics   Alcohol  use: No    Alcohol /week: 0.0 standard drinks of alcohol    Drug use: No     Colonoscopy:  PAP:  Bone density:  Lipid panel:  Allergies  Allergen Reactions   Bactrim  [Sulfamethoxazole -Trimethoprim ] Other (See Comments)    Sweating, feeling like throat is closing up   Pollen Extract    Codeine Rash    Current Outpatient Medications  Medication Sig Dispense Refill   FLUoxetine  (PROZAC ) 10 MG capsule Take 1 capsule (10 mg total) by mouth daily. 30 capsule 1   gabapentin  (NEURONTIN ) 100 MG capsule Take 1 capsule (100 mg total) by mouth at bedtime. 90 capsule 1   levocetirizine (XYZAL ) 5 MG tablet Take 1 tablet (5 mg total) by mouth every evening. 100 tablet 4   omeprazole  (PRILOSEC) 20 MG capsule Take 1 capsule (20 mg total) by mouth daily. 90 capsule 0   ondansetron  (ZOFRAN -ODT) 4 MG disintegrating tablet Take 1 tablet (4 mg total) by mouth every 8 (eight) hours as needed for nausea or vomiting. 30 tablet 0   sucralfate (CARAFATE) 1 g tablet Take 1  tablet (1 g total) by mouth 4 (four) times daily -  with meals and at bedtime. 120 tablet 1   triamcinolone  cream (KENALOG) 0.1 % Apply 1 Application topically 2 (two) times daily. Apply to rash areas. 30 g 2   valACYclovir (VALTREX) 1000 MG tablet Take 1 tablet (1,000 mg total) by mouth every 12 (twelve) hours for 10 days. May crush if needed to help. 20 tablet 0   Vitamin D , Ergocalciferol , (DRISDOL ) 1.25 MG (50000 UNIT) CAPS capsule Take 1 capsule (50,000 Units total) by mouth every 7 (seven) days. 12 capsule 1   amoxicillin  (AMOXIL ) 500 MG capsule Take 1 capsule (500 mg total) by mouth 2 (two) times daily for 7 days. (Patient not taking: Reported on 06/24/2024) 14 capsule 0   No  current facility-administered medications for this visit.    OBJECTIVE: Vitals:   06/24/24 1341  BP: 110/68  Pulse: 96  Resp: 18  Temp: 99 F (37.2 C)  SpO2: 99%     Body mass index is 24.14 kg/m.    ECOG FS:{CHL ONC [id number]  General: Well-developed, well-nourished, no acute distress. Eyes: Pink conjunctiva, anicteric sclera. HEENT: Normocephalic, moist mucous membranes. Lungs: No audible wheezing or coughing. Heart: Regular rate and rhythm. Abdomen: Soft, nontender, no obvious distention. Musculoskeletal: No edema, cyanosis, or clubbing. Neuro: Alert, answering all questions appropriately. Cranial nerves grossly intact. Skin: No rashes or petechiae noted. Psych: Normal affect. Lymphatics: No cervical, calvicular, axillary or inguinal LAD.   LAB RESULTS:  Lab Results  Component Value Date   NA 139 06/06/2024   K 4.6 06/06/2024   CL 104 06/06/2024   CO2 20 06/06/2024   GLUCOSE 93 06/06/2024   BUN 15 06/06/2024   CREATININE 0.98 06/06/2024   CALCIUM  8.8 06/06/2024   PROT 6.3 06/06/2024   ALBUMIN 3.7 (L) 06/06/2024   AST 17 06/06/2024   ALT 12 06/06/2024   ALKPHOS 77 06/06/2024   BILITOT 0.3 06/06/2024   GFRNONAA 57 (L) 03/28/2024   GFRAA 57 (L) 10/04/2020    Lab Results  Component Value Date   WBC 10.3 06/06/2024   NEUTROABS 6.0 06/06/2024   HGB 7.7 (L) 06/06/2024   HCT 27.6 (L) 06/06/2024   MCV 78 (L) 06/06/2024   PLT 319 06/06/2024     STUDIES: No results found.  ASSESSMENT:   PLAN:    Patient expressed understanding and was in agreement with this plan. She also understands that She can call clinic at any time with any questions, concerns, or complaints.    Cancer Staging  No matching staging information was found for the patient.   Renee JINNY Reusing, MD   06/24/2024 1:44 PM

## 2024-06-24 NOTE — Progress Notes (Unsigned)
 Patient is having swelling in her legs, and feel tight, to were it is starting to hurt her, ongoing for a couple of months. No energy at all, no appetite.

## 2024-06-25 ENCOUNTER — Encounter: Payer: Self-pay | Admitting: Oncology

## 2024-06-30 ENCOUNTER — Inpatient Hospital Stay

## 2024-06-30 VITALS — BP 134/70 | HR 75 | Temp 97.0°F | Resp 18

## 2024-06-30 DIAGNOSIS — D500 Iron deficiency anemia secondary to blood loss (chronic): Secondary | ICD-10-CM

## 2024-06-30 DIAGNOSIS — D509 Iron deficiency anemia, unspecified: Secondary | ICD-10-CM | POA: Diagnosis not present

## 2024-06-30 MED ORDER — IRON SUCROSE 20 MG/ML IV SOLN
200.0000 mg | Freq: Once | INTRAVENOUS | Status: AC
  Administered 2024-06-30: 200 mg via INTRAVENOUS
  Filled 2024-06-30: qty 10

## 2024-06-30 MED ORDER — ACETAMINOPHEN 325 MG PO TABS
650.0000 mg | ORAL_TABLET | Freq: Once | ORAL | Status: AC
  Administered 2024-06-30: 650 mg via ORAL
  Filled 2024-06-30: qty 2

## 2024-06-30 MED ORDER — SODIUM CHLORIDE 0.9% FLUSH
10.0000 mL | Freq: Once | INTRAVENOUS | Status: DC | PRN
  Filled 2024-06-30: qty 10

## 2024-06-30 NOTE — Progress Notes (Signed)
 Pt reports continued Nausea and one episode of Vomiting this morning. Pt reports constipation, with little relief from OTC medications. Pt reports having a very small strained hard BM this morning and lower abdominal pain rated an 8 out 10. Per Dr. Jacobo pt to follow up with PCP about these symptoms and give 650 MG PO Tylenol  now for abdominal pain. Pt verbalizes understanding and states she already has an appt with MD regarding these symptoms on 07/03/24.   1142: Pt stable at discharge.

## 2024-06-30 NOTE — Progress Notes (Signed)
 Outpatient Surgical Follow Up   Renee Bridges is an 78 y.o. female.   Chief Complaint  Patient presents with   New Patient (Initial Visit)    Abdominal mass     HPI: Patient was initially referred to me for abdominal wall mass.  I actually saw her in the hospital for this.  She was in the hospital because she had nausea and vomiting.  She returns today to discuss his abdominal wall mass.  She says that she will continue to get intermittent nausea vomiting.  She denies any pain over where the masses.  She denies any overlying skin changes.  She denies any bulges in her abdomen.  Past Medical History:  Diagnosis Date   Abdominal pain 03/27/2024   Abdominal pain, epigastric 03/28/2024   Anxiety    Arthritis    Asthma    Complication of surgical procedure 03/08/2018   Constipation 07/17/2016   Depression    Headache    History of hiatal hernia    Hypertension    Immobility 09/25/2016   Kidney cyst, acquired    Nausea and vomiting 03/28/2024   Seizures (HCC)    hx. as child   Senile purpura 04/16/2022   Shortness of breath dyspnea    with excertion    Past Surgical History:  Procedure Laterality Date   ABDOMINAL HYSTERECTOMY     BACK SURGERY     colonoscopy a year ago     ESOPHAGOGASTRODUODENOSCOPY N/A 03/28/2024   Procedure: EGD (ESOPHAGOGASTRODUODENOSCOPY);  Surgeon: Jinny Carmine, MD;  Location: [facility] ENDOSCOPY;  Service: Endoscopy;  Laterality: N/A;   LEG SURGERY Left 03/2015   mass removed from back - 20 years ago     SPINAL CORD STIMULATOR INSERTION N/A 01/17/2017   Procedure: LUMBAR SPINAL CORD STIMULATOR INSERTION;  Surgeon: Burnetta Aures, MD;  Location: [facility] OR;  Service: Orthopedics;  Laterality: N/A;  Requests 3 hrs   TONSILLECTOMY     TOTAL KNEE REVISION Left 05/13/2015   Procedure: REVISION LEFT  KNEE ARTHROPLASTY, REVISION PATELLA POLY EXCHANGE;  Surgeon: Redell Shoals, MD;  Location: [facility] ORS;  Service: Orthopedics;  Laterality: Left;    Family History   Problem Relation Age of Onset   Cancer Mother    Cancer Father    Diabetes Brother    Cancer Brother    Cancer Brother     Social History:  reports that she has never smoked. She has been exposed to tobacco smoke. She has never used smokeless tobacco. She reports that she does not drink alcohol  and does not use drugs.  Allergies:  Allergies  Allergen Reactions   Bactrim  [Sulfamethoxazole -Trimethoprim ] Other (See Comments)    Sweating, feeling like throat is closing up   Pollen Extract    Codeine Rash    Medications reviewed.    ROS Full ROS performed and is otherwise negative other than what is stated in HPI   BP 128/65   Pulse 79   Ht 5' 3 (1.6 m)   Wt 136 lb (61.7 kg)   LMP  (LMP Unknown)   SpO2 96%   BMI 24.09 kg/m   Physical Exam  Alert and oriented x 3, normal work of breathing room air, regular rate and rhythm, abdomen soft, nontender nondistended, with deep palpation you can feel a hard soft tissue collection in the left hemiabdomen that feels to be mobile.   CT scan shows a soft tissue mass in the left hemiabdomen Assessment/Plan:  Patient with abdominal wall mass.  This is  not contributing to her nausea or vomiting.  I did discuss with her that this is likely benign but we would be happy to refer her for biopsy if she would like this.  She did elect to have a referral to biopsy.  Addendum: I discussed the case with interventional radiology and they said that this has been present on scans for years and for that reason would like to forego biopsy.  I attempted to relay this information to the patient but was unable to talk to her on the phone.  A message was sent via MyChart.  Jayson Endow, M.D. [facility]

## 2024-07-02 ENCOUNTER — Inpatient Hospital Stay

## 2024-07-02 VITALS — BP 117/62 | HR 81 | Temp 96.5°F | Resp 16

## 2024-07-02 DIAGNOSIS — D500 Iron deficiency anemia secondary to blood loss (chronic): Secondary | ICD-10-CM

## 2024-07-02 DIAGNOSIS — D509 Iron deficiency anemia, unspecified: Secondary | ICD-10-CM | POA: Diagnosis not present

## 2024-07-02 MED ORDER — IRON SUCROSE 20 MG/ML IV SOLN
200.0000 mg | Freq: Once | INTRAVENOUS | Status: AC
  Administered 2024-07-02: 200 mg via INTRAVENOUS
  Filled 2024-07-02: qty 10

## 2024-07-02 NOTE — Patient Instructions (Signed)

## 2024-07-03 ENCOUNTER — Encounter: Payer: Self-pay | Admitting: General Surgery

## 2024-07-03 ENCOUNTER — Ambulatory Visit: Admitting: General Surgery

## 2024-07-03 VITALS — BP 144/72 | HR 94 | Temp 97.7°F | Ht 62.0 in | Wt 132.4 lb

## 2024-07-03 DIAGNOSIS — R1907 Generalized intra-abdominal and pelvic swelling, mass and lump: Secondary | ICD-10-CM

## 2024-07-03 DIAGNOSIS — R222 Localized swelling, mass and lump, trunk: Secondary | ICD-10-CM

## 2024-07-03 NOTE — Patient Instructions (Signed)
 Abdominal Pain, Adult    Many things can cause belly (abdominal) pain. In most cases, belly pain is not a serious problem and can be watched and treated at home. But in some cases, it can be serious.  Your doctor will try to find the cause of your belly pain.  Follow these instructions at home:  Medicines  Take over-the-counter and prescription medicines only as told by your doctor.  Do not take medicines that help you poop (laxatives) unless told by your doctor.  General instructions  Watch your belly pain for any changes. Tell your doctor if the pain gets worse.  Drink enough fluid to keep your pee (urine) pale yellow.  Contact a doctor if:  Your belly pain changes or gets worse.  You have very bad cramping or bloating in your belly.  You vomit.  Your pain gets worse with meals, after eating, or with certain foods.  You have trouble pooping or have watery poop for more than 2-3 days.  You are not hungry, or you lose weight without trying.  You have signs of not getting enough fluid or water (dehydration). These may include:  Dark pee, very Chastain pee, or no pee.  Cracked lips or dry mouth.  Feeling sleepy or weak.  You have pain when you pee or poop.  Your belly pain wakes you up at night.  You have blood in your pee.  You have a fever.  Get help right away if:  You cannot stop vomiting.  Your pain is only in one part of your belly, like on the right side.  You have bloody or black poop, or poop that looks like tar.  You have trouble breathing.  You have chest pain.  These symptoms may be an emergency. Get help right away. Call 911.  Do not wait to see if the symptoms will go away.  Do not drive yourself to the hospital.  This information is not intended to replace advice given to you by your health care provider. Make sure you discuss any questions you have with your health care provider.  Document Revised: 06/07/2022 Document Reviewed: 06/07/2022  Elsevier Patient Education  2024 [employer].

## 2024-07-03 NOTE — Progress Notes (Signed)
 Outpatient Surgical Follow Up  07/03/2024  Renee Bridges is an 78 y.o. female.   Chief Complaint  Patient presents with   New Patient (Initial Visit)    Abdominal mass    HPI: The patient returns today to discuss her left abdominal wall mass.  At the last visit we had talked about potential biopsy of this.  Review of her CT scans with radiology she has had this mass and it has been similar for years.  They elected to not perform a biopsy given the likely benign nature of this.  She reports that she continues to have lower abdominal pain and has a an appointment with her gastroenterologist in November.  Past Medical History:  Diagnosis Date   Abdominal pain 03/27/2024   Abdominal pain, epigastric 03/28/2024   Anxiety    Arthritis    Asthma    Complication of surgical procedure 03/08/2018   Constipation 07/17/2016   Depression    Headache    History of hiatal hernia    Hypertension    Immobility 09/25/2016   Kidney cyst, acquired    Nausea and vomiting 03/28/2024   Seizures (HCC)    hx. as child   Senile purpura 04/16/2022   Shortness of breath dyspnea    with excertion    Past Surgical History:  Procedure Laterality Date   ABDOMINAL HYSTERECTOMY     BACK SURGERY     colonoscopy a year ago     ESOPHAGOGASTRODUODENOSCOPY N/A 03/28/2024   Procedure: EGD (ESOPHAGOGASTRODUODENOSCOPY);  Surgeon: Jinny Carmine, MD;  Location[facility] ENDOSCOPY;  Service: Endoscopy;  Laterality: N/A;   LEG SURGERY Left 03/2015   mass removed from back - 20 years ago     SPINAL CORD STIMULATOR INSERTION N/A 01/17/2017   Procedure: LUMBAR SPINAL CORD STIMULATOR INSERTION;  Surgeon: Burnetta Aures, MD;  Location: [facility] OR;  Service: Orthopedics;  Laterality: N/A;  Requests 3 hrs   TONSILLECTOMY     TOTAL KNEE REVISION Left 05/13/2015   Procedure: REVISION LEFT  KNEE ARTHROPLASTY, REVISION PATELLA POLY EXCHANGE;  Surgeon: Redell Shoals, MD;  Location: [facility] ORS;  Service: Orthopedics;  Laterality: Left;     Family History  Problem Relation Age of Onset   Cancer Mother    Cancer Father    Diabetes Brother    Cancer Brother    Cancer Brother     Social History:  reports that she has never smoked. She has been exposed to tobacco smoke. She has never used smokeless tobacco. She reports that she does not drink alcohol  and does not use drugs.  Allergies:  Allergies  Allergen Reactions   Bactrim  [Sulfamethoxazole -Trimethoprim ] Other (See Comments)    Sweating, feeling like throat is closing up   Pollen Extract    Codeine Rash    Medications reviewed.    ROS Full ROS performed and is otherwise negative other than what is stated in HPI   BP (!) 144/72   Pulse 94   Temp 97.7 F (36.5 C) (Oral)   Ht 5' 2 (1.575 m)   Wt 132 lb 6.4 oz (60.1 kg)   LMP  (LMP Unknown)   SpO2 100%   BMI 24.22 kg/m   Physical Exam Abdomen is soft, nontender nondistended    CT scan with soft tissue mass Assessment/Plan:  Patient with abdominal wall mass.  This is suprafascial and I do not think is contributing to her pain.  I have discussed this with radiology and they have seen this along imaging 4  years and such it can be likely presumed as benign without biopsy.  I encouraged the patient to continue follow-up with gastroenterology.  At this time there is no role for surgical intervention.  She can follow-up with us  as needed.   Jayson Endow, M.D. [facility]

## 2024-07-04 ENCOUNTER — Telehealth: Payer: Self-pay | Admitting: Family Medicine

## 2024-07-04 NOTE — Telephone Encounter (Signed)
 Copied from CRM [id number]. Topic: Clinical - Home Health Verbal Orders >> Jul 04, 2024  4:49 PM Donee H wrote: Caller/Agency: Leita Kingsley from [facility]  Callback Number: [phone] Service Requested: Skilled Nursing, Physical Therapy, and Social Worker  Frequency:  Skilled nursing  is an evaluation  Physical Therapy  is being requested 1x per week for 8 weeks  Social Worker is being requested for evaluation  Any new concerns about the patient? Yes, patient is not taking medications as she should

## 2024-07-07 ENCOUNTER — Inpatient Hospital Stay

## 2024-07-07 NOTE — Telephone Encounter (Signed)
 OK for verbal orders?

## 2024-07-07 NOTE — Telephone Encounter (Signed)
 Called and LVM giving verbal orders per Dr. Laural Benes.

## 2024-07-09 ENCOUNTER — Inpatient Hospital Stay: Attending: Oncology

## 2024-07-09 VITALS — BP 109/72 | HR 80 | Temp 97.8°F | Resp 17

## 2024-07-09 DIAGNOSIS — D500 Iron deficiency anemia secondary to blood loss (chronic): Secondary | ICD-10-CM | POA: Diagnosis present

## 2024-07-09 MED ORDER — IRON SUCROSE 20 MG/ML IV SOLN
200.0000 mg | Freq: Once | INTRAVENOUS | Status: AC
  Administered 2024-07-09: 200 mg via INTRAVENOUS

## 2024-07-09 MED ORDER — SODIUM CHLORIDE 0.9% FLUSH
10.0000 mL | Freq: Once | INTRAVENOUS | Status: AC | PRN
  Administered 2024-07-09: 10 mL
  Filled 2024-07-09: qty 10

## 2024-07-10 ENCOUNTER — Telehealth: Payer: Self-pay | Admitting: Family Medicine

## 2024-07-10 NOTE — Telephone Encounter (Signed)
 Copied from CRM [id number]. Topic: Clinical - Order For Equipment >> Jul 10, 2024 11:09 AM Antwanette L wrote: Reason for CRM: Darold, a physical therapist assistant from [facility], called to inquire whether he can place an order for a rollator walker or if the physical therapist should submit the order instead. Darold can be reached at [phone] for follow-up.

## 2024-07-11 ENCOUNTER — Inpatient Hospital Stay

## 2024-07-11 VITALS — BP 129/79 | HR 81 | Temp 97.4°F | Resp 17

## 2024-07-11 DIAGNOSIS — D500 Iron deficiency anemia secondary to blood loss (chronic): Secondary | ICD-10-CM

## 2024-07-11 MED ORDER — SODIUM CHLORIDE 0.9% FLUSH
10.0000 mL | Freq: Once | INTRAVENOUS | Status: AC | PRN
  Administered 2024-07-11: 10 mL
  Filled 2024-07-11: qty 10

## 2024-07-11 MED ORDER — IRON SUCROSE 20 MG/ML IV SOLN
200.0000 mg | Freq: Once | INTRAVENOUS | Status: AC
  Administered 2024-07-11: 200 mg via INTRAVENOUS
  Filled 2024-07-11: qty 10

## 2024-07-14 ENCOUNTER — Inpatient Hospital Stay

## 2024-07-14 ENCOUNTER — Telehealth: Payer: Self-pay | Admitting: Oncology

## 2024-07-14 NOTE — Telephone Encounter (Signed)
 Pt left vm to r/s iron appt from today 11/10.   I called pt back and left a vm that I was returning her call to r/s iron appt. Scheduling phone number provided. Appt canceled so it would not show as a no show since she left a vm

## 2024-07-15 ENCOUNTER — Encounter: Payer: Self-pay | Admitting: Family Medicine

## 2024-07-15 ENCOUNTER — Ambulatory Visit (INDEPENDENT_AMBULATORY_CARE_PROVIDER_SITE_OTHER): Admitting: Family Medicine

## 2024-07-15 VITALS — BP 123/69 | HR 82 | Temp 97.6°F | Ht 62.0 in | Wt 135.0 lb

## 2024-07-15 DIAGNOSIS — F339 Major depressive disorder, recurrent, unspecified: Secondary | ICD-10-CM | POA: Diagnosis not present

## 2024-07-15 DIAGNOSIS — K2900 Acute gastritis without bleeding: Secondary | ICD-10-CM | POA: Diagnosis not present

## 2024-07-15 DIAGNOSIS — D500 Iron deficiency anemia secondary to blood loss (chronic): Secondary | ICD-10-CM | POA: Diagnosis not present

## 2024-07-15 DIAGNOSIS — R4182 Altered mental status, unspecified: Secondary | ICD-10-CM | POA: Diagnosis not present

## 2024-07-15 MED ORDER — SUCRALFATE 1 GM/10ML PO SUSP: 1.0000 g | Freq: Three times a day (TID) | ORAL | 1 refills | Status: DC

## 2024-07-15 MED ORDER — FLUOXETINE HCL 20 MG PO CAPS: 20.0000 mg | ORAL_CAPSULE | Freq: Every day | ORAL | 2 refills | Status: AC

## 2024-07-15 NOTE — Progress Notes (Signed)
 BP 123/69   Pulse 82   Temp 97.6 F (36.4 C) (Oral)   Ht 5' 2 (1.575 m)   Wt 135 lb (61.2 kg)   LMP  (LMP Unknown)   SpO2 97%   BMI 24.69 kg/m    Subjective:    Patient ID: Renee Bridges, female    DOB: 11-20-1945, 78 y.o.   MRN: [id number]  HPI: Renee Bridges is a 78 y.o. female  Chief Complaint  Patient presents with   Depression   gastritis   Anemia   DEPRESSION Mood status: uncontrolled Satisfied with current treatment?: no Symptom severity: moderate  Duration of current treatment : months Side effects: no Medication compliance: good compliance Psychotherapy/counseling: no  Previous psychiatric medications: prozac  Depressed mood: yes Anxious mood: yes Anhedonia: no Significant weight loss or gain: yes Insomnia: yes hard to fall asleep Fatigue: yes Feelings of worthlessness or guilt: yes Impaired concentration/indecisiveness: yes Suicidal ideations: no Hopelessness: yes Crying spells: yes    07/15/2024    1:09 PM 07/02/2024   12:20 PM 06/06/2024    1:56 PM 03/20/2024   11:19 AM 03/05/2024   10:58 AM  Depression screen PHQ 2/9  Decreased Interest 0 0 0 3 0  Down, Depressed, Hopeless 0 0 3 0 0  PHQ - 2 Score 0 0 3 3 0  Altered sleeping 3  3 3 1   Tired, decreased energy 3  3 3 1   Change in appetite 3  3 3 1   Feeling bad or failure about yourself  0  0 0 0  Trouble concentrating 0  0 0 0  Moving slowly or fidgety/restless 0  0 3 1  Suicidal thoughts 0  0 0   PHQ-9 Score 9  12  15  4    Difficult doing work/chores    Somewhat difficult      Data saved with a previous flowsheet row definition    ABDOMINAL PAIN- has not been taking her carafate. It's been getting caught in her throat and she's been unable to swallow it. She feels like her stomach is doing a bit better.   Duration:months Onset: sudden Severity: moderate Quality: aching, gnawing Location:  epigastric  Episode duration: chronic Radiation: no Frequency: intermittent Status:  better Treatments attempted: antacids and PPI Fever: no Nausea: yes Vomiting: yes Weight loss: yes Decreased appetite: yes Diarrhea: no Constipation: no Blood in stool: no Heartburn: yes Jaundice: no Rash: no Dysuria/urinary frequency: no Hematuria: no History of sexually transmitted disease: no Recurrent NSAID use: no  Relevant past medical, surgical, family and social history reviewed and updated as indicated. Interim medical history since our last visit reviewed. Allergies and medications reviewed and updated.  Review of Systems  Constitutional: Negative.   Respiratory: Negative.    Cardiovascular: Negative.   Gastrointestinal:  Positive for abdominal pain, nausea and vomiting. Negative for abdominal distention, anal bleeding, blood in stool, constipation, diarrhea and rectal pain.  Musculoskeletal: Negative.   Skin: Negative.   Neurological: Negative.   Psychiatric/Behavioral:  Positive for dysphoric mood. Negative for agitation, behavioral problems, confusion, decreased concentration, hallucinations, self-injury, sleep disturbance and suicidal ideas. The patient is nervous/anxious. The patient is not hyperactive.     Per HPI unless specifically indicated above     Objective:    BP 123/69   Pulse 82   Temp 97.6 F (36.4 C) (Oral)   Ht 5' 2 (1.575 m)   Wt 135 lb (61.2 kg)   LMP  (LMP Unknown)   SpO2  97%   BMI 24.69 kg/m   Wt Readings from Last 3 Encounters:  07/15/24 135 lb (61.2 kg)  07/03/24 132 lb 6.4 oz (60.1 kg)  06/24/24 132 lb (59.9 kg)    Physical Exam Vitals and nursing note reviewed.  Constitutional:      General: She is not in acute distress.    Appearance: Normal appearance. She is not ill-appearing, toxic-appearing or diaphoretic.  HENT:     Head: Normocephalic and atraumatic.     Right Ear: External ear normal.     Left Ear: External ear normal.     Nose: Nose normal.     Mouth/Throat:     Mouth: Mucous membranes are moist.      Pharynx: Oropharynx is clear.  Eyes:     General: No scleral icterus.       Right eye: No discharge.        Left eye: No discharge.     Extraocular Movements: Extraocular movements intact.     Conjunctiva/sclera: Conjunctivae normal.     Pupils: Pupils are equal, round, and reactive to light.  Cardiovascular:     Rate and Rhythm: Normal rate and regular rhythm.     Pulses: Normal pulses.     Heart sounds: Normal heart sounds. No murmur heard.    No friction rub. No gallop.  Pulmonary:     Effort: Pulmonary effort is normal. No respiratory distress.     Breath sounds: Normal breath sounds. No stridor. No wheezing, rhonchi or rales.  Chest:     Chest wall: No tenderness.  Musculoskeletal:        General: Normal range of motion.     Cervical back: Normal range of motion and neck supple.  Skin:    General: Skin is warm and dry.     Capillary Refill: Capillary refill takes less than 2 seconds.     Coloration: Skin is not jaundiced or pale.     Findings: No bruising, erythema, lesion or rash.  Neurological:     General: No focal deficit present.     Mental Status: She is alert and oriented to person, place, and time. Mental status is at baseline.  Psychiatric:        Mood and Affect: Mood normal.        Behavior: Behavior normal.        Thought Content: Thought content normal.        Judgment: Judgment normal.     Results for orders placed or performed in visit on 06/24/24  Lactate dehydrogenase   Collection Time: 06/24/24  2:06 PM  Result Value Ref Range   LDH 150 98 - 192 U/L  Folic Acid   Collection Time: 06/24/24  2:06 PM  Result Value Ref Range   Folate 12.6 >5.9 ng/mL  Iron and TIBC   Collection Time: 06/24/24  2:06 PM  Result Value Ref Range   Iron 14 (L) 28 - 170 ug/dL   TIBC 615 749 - 549 ug/dL   Saturation Ratios 4 (L) 10.4 - 31.8 %   UIBC 370 ug/dL  Ferritin   Collection Time: 06/24/24  2:06 PM  Result Value Ref Range   Ferritin <3 (L) 11 - 307 ng/mL  CBC  (Cancer Center Only)   Collection Time: 06/24/24  2:06 PM  Result Value Ref Range   WBC Count 9.7 4.0 - 10.5 K/uL   RBC 3.33 (L) 3.87 - 5.11 MIL/uL   Hemoglobin 7.2 (L) 12.0 - 15.0 g/dL  HCT 24.4 (L) 36.0 - 46.0 %   MCV 73.3 (L) 80.0 - 100.0 fL   MCH 21.6 (L) 26.0 - 34.0 pg   MCHC 29.5 (L) 30.0 - 36.0 g/dL   RDW 83.5 (H) 88.4 - 84.4 %   Platelet Count 295 150 - 400 K/uL   nRBC 0.0 0.0 - 0.2 %  Vitamin B12   Collection Time: 06/24/24  2:07 PM  Result Value Ref Range   Vitamin B-12 196 180 - 914 pg/mL      Assessment & Plan:   Problem List Items Addressed This Visit       Digestive   Acute gastritis   Has been unable to take her carafate. Will see if we can get the liquid approved. Has not followed up with her GI doctor. Will get her back into see them. Call with any concerns.       Relevant Orders   AMB Referral [facility] Care Management     Other   Depression, recurrent - Primary   Doing better, but still not good- will increase her prozac  to 20mg  and recheck in 4-6 weeks. Will get [facility] involved to help with complex care management. Encouraged her to answer the phone when they call.       Relevant Medications   FLUoxetine  (PROZAC ) 20 MG capsule   Other Relevant Orders   AMB Referral [facility] Care Management   Mental status, decreased   Will get [facility] involved to help with complex care management. Encouraged her to answer the phone when they call.       Relevant Orders   AMB Referral [facility] Care Management   Iron deficiency anemia due to chronic blood loss   Following with hematology but missed her appointment yesterday- will get her rescheduled with them. Call with any concerns.       Relevant Orders   AMB Referral [facility] Care Management     Follow up plan: Return in about 6 weeks (around 08/26/2024).

## 2024-07-15 NOTE — Assessment & Plan Note (Signed)
 Following with hematology but missed her appointment yesterday- will get her rescheduled with them. Call with any concerns.

## 2024-07-15 NOTE — Assessment & Plan Note (Signed)
 Doing better, but still not good- will increase her prozac  to 20mg  and recheck in 4-6 weeks. Will get [facility] involved to help with complex care management. Encouraged her to answer the phone when they call.

## 2024-07-15 NOTE — Assessment & Plan Note (Signed)
 Will get [facility] involved to help with complex care management. Encouraged her to answer the phone when they call.

## 2024-07-15 NOTE — Assessment & Plan Note (Signed)
 Has been unable to take her carafate. Will see if we can get the liquid approved. Has not followed up with her GI doctor. Will get her back into see them. Call with any concerns.

## 2024-07-15 NOTE — Patient Instructions (Signed)
 Dr. Kayla office will be calling you:  [phone]

## 2024-07-16 ENCOUNTER — Telehealth: Payer: Self-pay

## 2024-07-16 ENCOUNTER — Telehealth: Payer: Self-pay | Admitting: Oncology

## 2024-07-16 NOTE — Progress Notes (Signed)
 Complex Care Management Note Care Guide Note  07/16/2024 Name: Renee Bridges MRN: [id number] DOB: November 07, 1945   Complex Care Management Outreach Attempts: An unsuccessful telephone outreach was attempted today to offer the patient information about available complex care management services.  Follow Up Plan:  Additional outreach attempts will be made to offer the patient complex care management information and services.   Encounter Outcome:  No Answer  Jeoffrey Buffalo , RMA     [place]  [facility], [facility] Guide  Direct Dial: [phone]  Website: [email or web address]

## 2024-07-16 NOTE — Telephone Encounter (Signed)
 Pt called and left a vm on [employer] phone wanting to rs her appts from 11/10. I called the pt, no answer so I left a vm for her to return my call so I can get her rs.

## 2024-07-17 NOTE — Progress Notes (Signed)
 Complex Care Management Note Care Guide Note  07/17/2024 Name: MADDILYN CAMPUS MRN: [id number] DOB: [birth year]   Complex Care Management Outreach Attempts: An unsuccessful telephone outreach was attempted today to offer the patient information about available complex care management services.  Follow Up Plan:  Additional outreach attempts will be made to offer the patient complex care management information and services.   Encounter Outcome:  No Answer  Jeoffrey Buffalo , RMA     [place]  [facility], [facility] Guide  Direct Dial: [phone]  Website: [email or web address]

## 2024-07-21 NOTE — Progress Notes (Signed)
 Complex Care Management Note Care Guide Note  07/21/2024 Name: Renee Bridges MRN: [id number] DOB: 1945-10-22   Complex Care Management Outreach Attempts: A second unsuccessful outreach was attempted today to offer the patient with information about available complex care management services.  Follow Up Plan:  Additional outreach attempts will be made to offer the patient complex care management information and services.   Encounter Outcome:  No Answer  Jeoffrey Buffalo , RMA     [place]  [facility], [facility] Guide  Direct Dial: [phone]  Website: [email or web address]

## 2024-07-21 NOTE — Progress Notes (Signed)
 Complex Care Management Note  Care Guide Note 07/21/2024 Name: [name] MRN: [id number] DOB: 02/12/1946  Renee Bridges is a 78 y.o. year old female who sees Vicci Duwaine SQUIBB, DO for primary care. I reached out to Carole GORMAN Reap by phone today to offer complex care management services.  [name] was given information about Complex Care Management services today including:   The Complex Care Management services include support from the care team which includes your Nurse Care Manager, Clinical Social Worker, or Pharmacist.  The Complex Care Management team is here to help remove barriers to the health concerns and goals most important to you. Complex Care Management services are voluntary, and the patient may decline or stop services at any time by request to their care team member.   Complex Care Management Consent Status: Patient agreed to services and verbal consent obtained.   Follow up plan:  Telephone appointment with complex care management team member scheduled for:  [facility] 07/25/2024 LCSW 08/19/2024  Encounter Outcome:  Patient Scheduled  Jeoffrey Buffalo , RMA     [place]  [facility], [facility] Guide  Direct Dial: [phone]  Website: [email or web address]

## 2024-07-23 DIAGNOSIS — D509 Iron deficiency anemia, unspecified: Secondary | ICD-10-CM | POA: Diagnosis not present

## 2024-07-23 DIAGNOSIS — M79605 Pain in left leg: Secondary | ICD-10-CM | POA: Diagnosis not present

## 2024-07-23 DIAGNOSIS — E782 Mixed hyperlipidemia: Secondary | ICD-10-CM

## 2024-07-23 DIAGNOSIS — F32A Depression, unspecified: Secondary | ICD-10-CM | POA: Diagnosis not present

## 2024-07-23 DIAGNOSIS — B029 Zoster without complications: Secondary | ICD-10-CM | POA: Diagnosis not present

## 2024-07-23 DIAGNOSIS — Z9181 History of falling: Secondary | ICD-10-CM

## 2024-07-25 ENCOUNTER — Other Ambulatory Visit

## 2024-07-25 NOTE — Patient Outreach (Incomplete)
 Complex Care Management   Visit Note  07/25/2024  Name:  Renee Bridges MRN: [id number] DOB: Nov 30, 1945  Situation: Referral received for Complex Care Management related to SDOH Barriers:  Transportation I obtained verbal consent from Patient.  Visit completed with Renee Bridges  on the phone  Background:   Past Medical History:  Diagnosis Date   Abdominal pain 03/27/2024   Abdominal pain, epigastric 03/28/2024   Anxiety    Arthritis    Asthma    Complication of surgical procedure 03/08/2018   Constipation 07/17/2016   Depression    Headache    History of hiatal hernia    Hypertension    Immobility 09/25/2016   Kidney cyst, acquired    Nausea and vomiting 03/28/2024   Seizures (HCC)    hx. as child   Senile purpura 04/16/2022   Shortness of breath dyspnea    with excertion    Assessment: Patient Reported Symptoms:  Cognitive Cognitive Status: Alert and oriented to person, place, and time, Insightful and able to interpret abstract concepts, Normal speech and language skills Cognitive/Intellectual Conditions Management [RPT]: Other      Neurological Neurological Review of Symptoms: No symptoms reported    HEENT HEENT Symptoms Reported: No symptoms reported      Cardiovascular Cardiovascular Symptoms Reported: Swelling in legs or feet Does patient have uncontrolled Hypertension?: No Cardiovascular Comment: Right ankle and foot stays swollen all the time.  States doctor said he couldn't do anything about the swelling.  Respiratory Respiratory Symptoms Reported: No symptoms reported Respiratory Self-Management Outcome: 4 (good)  Endocrine Endocrine Symptoms Reported: No symptoms reported Is patient diabetic?: No    Gastrointestinal Gastrointestinal Symptoms Reported: Vomiting, Constipation Additional Gastrointestinal Details: When she eats something that doesn't agree with her stomach or stressed, she starts vomiting.  After she drinks her coffee, she has a bowel  movement. Gastrointestinal Comment: She has appointment with [place] 09/08/2024    Genitourinary Genitourinary Symptoms Reported: No symptoms reported Genitourinary Self-Management Outcome: 4 (good)  Integumentary Integumentary Symptoms Reported: No symptoms reported    Musculoskeletal Musculoskelatal Symptoms Reviewed: Unsteady gait, Weakness Additional Musculoskeletal Details: She uses a cane when she walks outside. She has a walker to use, shower chair but it's too large for the tub.  Has a bathroom safety rail.   Falls in the past year?: No    Psychosocial Psychosocial Symptoms Reported: Sadness - if selected complete PHQ 2-9     Quality of Family Relationships: non-existent    07/25/2024    PHQ2-9 Depression Screening   Little interest or pleasure in doing things Not at all  Feeling down, depressed, or hopeless Several days  PHQ-2 - Total Score 1  Trouble falling or staying asleep, or sleeping too much    Feeling tired or having little energy    Poor appetite or overeating     Feeling bad about yourself - or that you are a failure or have let yourself or your family down    Trouble concentrating on things, such as reading the newspaper or watching television    Moving or speaking so slowly that other people could have noticed.  Or the opposite - being so fidgety or restless that you have been moving around a lot more than usual    Thoughts that you would be better off dead, or hurting yourself in some way    PHQ2-9 Total Score    If you checked off any problems, how difficult have these problems made it for you to  do your work, take care of things at home, or get along with other people    Depression Interventions/Treatment      There were no vitals filed for this visit. Pain Scale: 0-10 Pain Score: 0-No pain  Medications Reviewed Today   Medications were not reviewed in this encounter     Recommendation:   {RECOMMENDATONS:32554}  Follow Up Plan:    {FOLLOWUP:32559}  SIG ***

## 2024-07-25 NOTE — Patient Instructions (Signed)
 Carole GORMAN Reap - I am sorry I was unable to reach you today for our scheduled appointment. I work with Vicci Duwaine SQUIBB, DO and am calling to support your healthcare needs. Please contact me at [phone] at your earliest convenience. I look forward to speaking with you soon.   Thank you,  Santana Stamp BSN, CCM [place]  [facility] Population Health RN Care Manager Direct Dial: [phone]  Fax: [phone]

## 2024-07-28 ENCOUNTER — Other Ambulatory Visit: Payer: Self-pay

## 2024-07-28 NOTE — Patient Outreach (Signed)
 Conference call with patient and [employer] representative [phone]).  Rep states patient does not have access to Care Navigator since she has Medicaid Direct.  She only has access to mental health benefits through [employer].  She is to use care navigator through [facility] (she and I connected her with [facility] Navigator on 07/25/24, [place], ph: [phone]).  Patient's transportation is through DSS.  Patient and I made a plan to conference call with [facility] transportation to make sure staff has the correct address for travel to [facility] (patient states the DSS transportation service has messed up  a few rides; taking her to the wrong place, saying she didn't make an appointment and not showing up at all, which has made her miss iron  infusions).

## 2024-07-28 NOTE — Patient Outreach (Addendum)
 Complex Care Management   Visit Note  07/28/2024  Name:  Renee Bridges MRN: [id number] DOB: May 29, 1946  Situation: Referral received for Complex Care Management related to Anemia, depression/anxiety/stress. I obtained verbal consent from Patient.  Visit completed with Renee Bridges  on the phone.   Background:   Past Medical History:  Diagnosis Date   Abdominal pain 03/27/2024   Abdominal pain, epigastric 03/28/2024   Anxiety    Arthritis    Asthma    Complication of surgical procedure 03/08/2018   Constipation 07/17/2016   Depression    Headache    History of hiatal hernia    Hypertension    Immobility 09/25/2016   Kidney cyst, acquired    Nausea and vomiting 03/28/2024   Seizures (HCC)    hx. as child   Senile purpura 04/16/2022   Shortness of breath dyspnea    with excertion    Assessment: Patient Reported Symptoms:  Cognitive Cognitive Status: Alert and oriented to person, place, and time, Insightful and able to interpret abstract concepts, Normal speech and language skills Cognitive/Intellectual Conditions Management [RPT]: Other      Neurological Neurological Review of Symptoms: No symptoms reported    HEENT HEENT Symptoms Reported: No symptoms reported      Cardiovascular Cardiovascular Symptoms Reported: Swelling in legs or feet Does patient have uncontrolled Hypertension?: No Cardiovascular Comment: Right ankle and foot stays swollen all the time.  States doctor said he couldn't do anything about the swelling.  Respiratory Respiratory Symptoms Reported: No symptoms reported Respiratory Self-Management Outcome: 4 (good)  Endocrine Endocrine Symptoms Reported: No symptoms reported Is patient diabetic?: No    Gastrointestinal Gastrointestinal Symptoms Reported: Vomiting, Constipation Additional Gastrointestinal Details: When she eats something that doesn't agree with her stomach or stressed, she starts vomiting.  After she drinks her coffee, she has a bowel  movement. Gastrointestinal Comment: She has appointment with [facility] 09/08/2024    Genitourinary Genitourinary Symptoms Reported: No symptoms reported Genitourinary Self-Management Outcome: 4 (good)  Integumentary Integumentary Symptoms Reported: No symptoms reported    Musculoskeletal Musculoskelatal Symptoms Reviewed: Unsteady gait, Weakness Additional Musculoskeletal Details: She uses a cane when she walks outside. She has a walker to use, shower chair but it's too large for the tub.  Has a bathroom safety rail.   Falls in the past year?: No    Psychosocial Psychosocial Symptoms Reported: Sadness - if selected complete PHQ 2-9     Quality of Family Relationships: non-existent    07/28/2024    PHQ2-9 Depression Screening   Little interest or pleasure in doing things Not at all  Feeling down, depressed, or hopeless Several days  PHQ-2 - Total Score 1  Trouble falling or staying asleep, or sleeping too much    Feeling tired or having little energy    Poor appetite or overeating     Feeling bad about yourself - or that you are a failure or have let yourself or your family down    Trouble concentrating on things, such as reading the newspaper or watching television    Moving or speaking so slowly that other people could have noticed.  Or the opposite - being so fidgety or restless that you have been moving around a lot more than usual    Thoughts that you would be better off dead, or hurting yourself in some way    PHQ2-9 Total Score    If you checked off any problems, how difficult have these problems made it for you to do  your work, take care of things at home, or get along with other people    Depression Interventions/Treatment      There were no vitals filed for this visit. Pain Scale: 0-10 Pain Score: 0-No pain  Medications Reviewed Today   Medications were not reviewed in this encounter     Recommendation:   Discussed upcoming appts:  PCP 08/26/24 Specialty provider  follow-up : Oncology for iron  infusion 08/04/24; Gastro 09/08/2024  Conference call with patient and [facility] to be connected with Care Navigator, [place], ph: [phone].   Follow Up Plan:   Telephone follow-up in 1 day to check if she has access to [facility] care navigator  Santana Stamp BSN, CCM [place]  [facility] Population Health RN Care Manager Direct Dial: [phone]  Fax: [phone]

## 2024-07-28 NOTE — Patient Instructions (Signed)
 Visit Information  Thank you for taking time to visit with me today. Please don't hesitate to contact me if I can be of assistance to you before our next scheduled appointment.  Our next appointment is no further scheduled appointments.  Patient has been connected with their [facility] Care Navigator, ph: [phone] Please call the care guide team at [phone] if you need to cancel or reschedule your appointment.   Following is a copy of your care plan:   Goals Addressed   None     Please call the [place]  National Suicide Prevention Lifeline: 5192540028 or TTY: (506)518-1058 TTY [phone]) to talk to a trained counselor if you are experiencing a Mental Health or Behavioral Health Crisis or need someone to talk to.  Patient verbalized understanding of Care plan and visit instructions communicated this visit  Santana Stamp BSN, CCM [place]  [facility] Population Health RN Care Manager Direct Dial: [phone]  Fax: [phone]

## 2024-07-30 ENCOUNTER — Other Ambulatory Visit: Payer: Self-pay

## 2024-07-30 NOTE — Patient Outreach (Signed)
 This RNCM and patient had conference call with [facility] transportation.  Assisted patient in setting up transportation for appointment 08/04/24 for iron  infusion at [facility] in [place].  Per transportation staff, pick up time is scheduled for 12:13pm on 08/04/24 with a return trip pick up at 1:45pm.  Patient understands instructions.

## 2024-08-04 ENCOUNTER — Inpatient Hospital Stay: Attending: Oncology

## 2024-08-04 VITALS — BP 144/64 | HR 77 | Temp 97.6°F | Resp 19

## 2024-08-04 DIAGNOSIS — D500 Iron deficiency anemia secondary to blood loss (chronic): Secondary | ICD-10-CM

## 2024-08-04 DIAGNOSIS — D509 Iron deficiency anemia, unspecified: Secondary | ICD-10-CM | POA: Diagnosis present

## 2024-08-04 MED ORDER — SODIUM CHLORIDE 0.9% FLUSH
10.0000 mL | Freq: Once | INTRAVENOUS | Status: AC | PRN
  Administered 2024-08-04: 10 mL
  Filled 2024-08-04: qty 10

## 2024-08-04 MED ORDER — IRON SUCROSE 20 MG/ML IV SOLN
200.0000 mg | Freq: Once | INTRAVENOUS | Status: AC
  Administered 2024-08-04: 200 mg via INTRAVENOUS
  Filled 2024-08-04: qty 10

## 2024-08-06 ENCOUNTER — Telehealth: Payer: Self-pay

## 2024-08-06 NOTE — Patient Outreach (Signed)
 Incoming call from patient, states she made it to her Iron  Infusion appt on 12/01 but the driver first took her to the wrong side of the building instead of the cancer center (she and I made a 3-way call with transportation last week to set up appt and she made sure to tell them the address was at the [facility]).  When she was ready to be picked up, no one came.  She had to call three separate times to get a ride home, stating I was so weak and feeling so bad after the iron  infusion, it was very rough on me to have to wait.  She also stated when she set up transportation for an eye appt for 07/24/24, the driver took her to the wrong address, she was able to redirect him to the correct address.  Then, when she was finished with the appointment no one showed back up to take her home.  She stood at the eye clinic front desk and tried to call, transportation staff stated they could not pick her up because she didn't call three days in advance (she did call three days in advance because they were able to TAKE her to the appt).  A customer at the eye clinic, a stranger to Ms.Olmeda heard her plight and took her home in their vehicle.    This RNCM will discuss with management for a solution. Made plans to follow up with [name] within a week.

## 2024-08-08 ENCOUNTER — Telehealth: Payer: Self-pay | Admitting: Family Medicine

## 2024-08-08 NOTE — Telephone Encounter (Signed)
 Copied from CRM #[id number]. Topic: Clinical - Order For Equipment >> Aug 08, 2024 11:40 AM Rosaria BRAVO wrote: Reason for CRM: [facility] called requesting a light weight rollator  Best contact: [phone] [place])

## 2024-08-11 NOTE — Telephone Encounter (Signed)
 Sent to provider via [facility] DME order. Patient will receive text with update and patients granddaughter is listed as 2nd contact for the order.

## 2024-08-11 NOTE — Telephone Encounter (Signed)
 Routing to provider. Can this be ordered for the patient?

## 2024-08-11 NOTE — Telephone Encounter (Signed)
 Yes, OK to write up and I'll sign

## 2024-08-13 ENCOUNTER — Other Ambulatory Visit: Payer: Self-pay

## 2024-08-13 NOTE — Patient Outreach (Signed)
 Complex Care Management   Visit Note  08/13/2024  Name:  Renee Bridges MRN: [id number] DOB: 1945-12-14  Situation: Referral received for Complex Care Management related to Anemia, Depression. I obtained verbal consent from Patient.  Visit completed with Renee Bridges  on the phone.  Main concern today continues having stomach pain, wanted to  make sure she is taking sucralfate  correctly. Was able to attend and receive iron  infusion at [facility] on 08/04/24.  Has transportation through [facility] but has difficulty communicating with staff that answer the phone to set up transportation.   Background:   Past Medical History:  Diagnosis Date   Abdominal pain 03/27/2024   Abdominal pain, epigastric 03/28/2024   Anxiety    Arthritis    Asthma    Complication of surgical procedure 03/08/2018   Constipation 07/17/2016   Depression    Headache    History of hiatal hernia    Hypertension    Immobility 09/25/2016   Kidney cyst, acquired    Nausea and vomiting 03/28/2024   Seizures (HCC)    hx. as child   Senile purpura 04/16/2022   Shortness of breath dyspnea    with excertion    Assessment: Patient Reported Symptoms:  Cognitive Cognitive Status: Alert and oriented to person, place, and time, Insightful and able to interpret abstract concepts, Normal speech and language skills Cognitive/Intellectual Conditions Management [RPT]: Other Other: Mental Status, decreased      Neurological Neurological Review of Symptoms: No symptoms reported    HEENT HEENT Symptoms Reported: Not assessed      Cardiovascular Cardiovascular Symptoms Reported: Swelling in legs or feet Cardiovascular Comment: Right ankle and foot swelling at baseline  Respiratory Respiratory Symptoms Reported: No symptoms reported    Endocrine Endocrine Symptoms Reported: Not assessed    Gastrointestinal Gastrointestinal Symptoms Reported: Nausea Additional Gastrointestinal Details: She is having difficulty  with stomach pain when she eats/drinks/takes pills.  She has [facility] appt scheduled 09/08/2024, PCP has prescribed sucralfate  1G/77ml three times Bridges day with meals, once at night.   Educated patient on how to measure out the liquid form of sucralfate  to take as prescribed. Gastrointestinal Management Strategies: Medication therapy    Genitourinary Genitourinary Symptoms Reported: No symptoms reported    Integumentary Integumentary Symptoms Reported: Not assessed    Musculoskeletal Musculoskelatal Symptoms Reviewed: Not assessed        Psychosocial Psychosocial Symptoms Reported: Sadness - if selected complete PHQ 2-9 Behavioral Management Strategies: Medication therapy Behavioral Health Comment: PCP has prescribed fluoxetine  20mg  once daily (increased from 10mg  to 20mg  on 07/15/24        08/13/2024    PHQ2-9 Depression Screening   Little interest or pleasure in doing things Not at all  Feeling down, depressed, or hopeless Several days  PHQ-2 - Total Score 1  Trouble falling or staying asleep, or sleeping too much    Feeling tired or having little energy    Poor appetite or overeating     Feeling bad about yourself - or that you are Bridges failure or have let yourself or your family down    Trouble concentrating on things, such as reading the newspaper or watching television    Moving or speaking so slowly that other people could have noticed.  Or the opposite - being so fidgety or restless that you have been moving around Bridges lot more than usual    Thoughts that you would be better off dead, or hurting yourself in some way  PHQ2-9 Total Score    If you checked off any problems, how difficult have these problems made it for you to do your work, take care of things at home, or get along with other people    Depression Interventions/Treatment      There were no vitals filed for this visit. Pain Scale: 0-10 Pain Score: 3  Pain Type: Acute pain Pain Location: Abdomen Pain Orientation:  Lower Pain Descriptors / Indicators: Discomfort Pain Onset: On-going Pain Intervention(s): Medication (See eMAR)  Medications Reviewed Today     Reviewed by Renee Bridges LABOR, RN (Registered Nurse) on 08/13/24 at 1338  Med List Status: <None>   Medication Order Taking? Sig Documenting Provider Last Dose Status Informant  FLUoxetine  (PROZAC ) 20 MG capsule [id number] Yes Take 1 capsule (20 mg total) by mouth daily. Bridges, Renee P, DO  Active   gabapentin  (NEURONTIN ) 100 MG capsule [id number]  Take 1 capsule (100 mg total) by mouth at bedtime.  Patient not taking: Reported on 08/13/2024   Renee Duwaine SQUIBB, DO  Active   levocetirizine (XYZAL ) 5 MG tablet [id number]  Take 1 tablet (5 mg total) by mouth every evening.  Patient not taking: Reported on 08/13/2024   Renee Duwaine SQUIBB, DO  Active   omeprazole  (PRILOSEC) 20 MG capsule [id number] Yes Take 1 capsule (20 mg total) by mouth daily. Bridges, Renee P, DO  Active   ondansetron  (ZOFRAN -ODT) 4 MG disintegrating tablet [id number] Yes Take 1 tablet (4 mg total) by mouth every 8 (eight) hours as needed for nausea or vomiting. Renee Sor A, DO  Active   sucralfate  (CARAFATE ) 1 GM/10ML suspension [id number] Yes Take 10 mLs (1 g total) by mouth 4 (four) times daily -  with meals and at bedtime. Renee, Renee P, DO  Active   triamcinolone  cream (KENALOG ) 0.1 % [id number] Yes Apply 1 Application topically 2 (two) times daily. Apply to rash areas. Bridges, Renee T, NP  Active   Vitamin D , Ergocalciferol , (DRISDOL ) 1.25 MG (50000 UNIT) CAPS capsule [id number]  Take 1 capsule (50,000 Units total) by mouth every 7 (seven) days.  Patient not taking: Reported on 08/13/2024   Renee Duwaine SQUIBB, DO  Active             Recommendation:   PCP follow up 08/26/24 Specialty provider follow-up : Renee Bridges 09/08/2024 This RNCM will perform 3-way call with patient and [facility] transportation to make sure ride is set for PCP appt 08/26/24.    Follow Up Plan:   Telephone  follow-up in 1 week  Bridges Renee BSN, CCM [employer]  [place] Population Health RN Care Manager Direct Dial: [phone]  Fax: [phone]

## 2024-08-13 NOTE — Patient Instructions (Signed)
 Visit Information  Thank you for taking time to visit with me today. Please don't hesitate to contact me if I can be of assistance to you before our next scheduled appointment.  Your next care management appointment is by telephone on Wednesday, December 17th at 10:00am.  Please call the care guide team at [phone] if you need to cancel, schedule, or reschedule an appointment.   Please call the [place]  National Suicide Prevention Lifeline: (681) 811-0699 or TTY: 929-570-7950 TTY [phone]) to talk to a trained counselor if you are experiencing a Mental Health or Behavioral Health Crisis or need someone to talk to.  Santana Stamp BSN, CCM [place]  [facility] Population Health RN Care Manager Direct Dial: [phone]  Fax: [phone]

## 2024-08-19 ENCOUNTER — Telehealth: Payer: Self-pay | Admitting: *Deleted

## 2024-08-19 ENCOUNTER — Encounter: Payer: Self-pay | Admitting: *Deleted

## 2024-08-20 ENCOUNTER — Other Ambulatory Visit

## 2024-08-20 NOTE — Patient Instructions (Signed)
 Carole GORMAN Reap - I am sorry I was unable to reach you today for our scheduled appointment. I work with Vicci Duwaine SQUIBB, DO and am calling to support your healthcare needs. Please contact me at [phone] at your earliest convenience. I look forward to speaking with you soon.   Thank you,  Santana Stamp BSN, CCM [place]  [facility] Population Health RN Care Manager Direct Dial: [phone]  Fax: [phone]

## 2024-08-20 NOTE — Patient Instructions (Signed)
 Visit Information  Thank you for taking time to visit with me today. Please don't hesitate to contact me if I can be of assistance to you before our next scheduled appointment.  Your next care management appointment is by telephone on 08/27/2024 at 1000am   Please call the care guide team at [phone] if you need to cancel, schedule, or reschedule an appointment.   Please call the [place]  National Suicide Prevention Lifeline: 503-441-1255 or TTY: 239-641-8593 TTY [phone]) to talk to a trained counselor if you are experiencing a Mental Health or Behavioral Health Crisis or need someone to talk to.  Santana Stamp BSN, CCM [place]  [facility] Population Health RN Care Manager Direct Dial: [phone]  Fax: [phone]

## 2024-08-20 NOTE — Patient Outreach (Signed)
 Warm conference call with patient to set up transportation with [facility] for 08/26/24 with Dr. Dallas.  Trip Ride ID [id number], return ride ID [id number], pick up time 1:32pm, with pick up at 3:40pm.

## 2024-08-25 ENCOUNTER — Encounter: Payer: Self-pay | Admitting: Oncology

## 2024-08-25 ENCOUNTER — Ambulatory Visit: Payer: Self-pay

## 2024-08-25 NOTE — Telephone Encounter (Signed)
" °  FYI Only or Action Required?: Action required by provider: request for appointment and update on patient condition.  Patient was last seen in primary care on 07/15/2024 by Vicci Duwaine SQUIBB, DO.  Called Nurse Triage reporting Urinary Frequency.  Symptoms began several days ago.  Interventions attempted: Nothing.  Symptoms are: stable.  Triage Disposition: See Physician Within 24 Hours  Patient/caregiver understands and will follow disposition?: Yes  Copied from CRM #[id number]. Topic: Clinical - Red Word Triage >> Aug 25, 2024 11:00 AM Winona R wrote: Pt would like to reschedule her appointment however she is experiencing burning during urination Reason for Disposition  Urinating more frequently than usual (i.e., frequency) OR new-onset of the feeling of an urgent need to urinate (i.e., urgency)  Answer Assessment - Initial Assessment Questions 1. SYMPTOM: What's the main symptom you're concerned about? (e.g., frequency, incontinence)     Burning upon urination  2. ONSET: When did the  burning  start?      X couple days  3. PAIN: Is there any pain? If Yes, ask: How bad is it? (Scale: 1-10; mild, moderate, severe)       5/10  4. CAUSE: What do you think is causing the symptoms?      Pt states may have to do with water in home  5. OTHER SYMPTOMS: Pt denies blood in urine, fever, flank pain         Pt reports burning upon urination Pt  offered an in clinic visit, however, pt states she could not come in until Jan 5th.  Pt requesting to cancel visit on 12.23.25, however, visit left for pt due to uncertainty and new symptoms. Routing to clinic to assist with patient's symptoms. Pt agrees with plan of care, will call back for any worsening symptoms  Protocols used: Urinary Symptoms-A-AH  "

## 2024-08-26 ENCOUNTER — Ambulatory Visit: Admitting: Family Medicine

## 2024-08-27 ENCOUNTER — Telehealth: Payer: Self-pay

## 2024-08-27 ENCOUNTER — Telehealth: Payer: Self-pay | Admitting: Family Medicine

## 2024-08-27 ENCOUNTER — Other Ambulatory Visit: Payer: Self-pay

## 2024-08-27 DIAGNOSIS — Z139 Encounter for screening, unspecified: Secondary | ICD-10-CM

## 2024-08-27 MED ORDER — NITROFURANTOIN MONOHYD MACRO 100 MG PO CAPS: 100.0000 mg | ORAL_CAPSULE | Freq: Two times a day (BID) | ORAL | 0 refills | Status: AC

## 2024-08-27 NOTE — Telephone Encounter (Signed)
 Urine culture faxed over showing e. Coli UTI. Patient unable to get on the phone and no-showed her appointment yesterday. Unclear if someone else has treated this. Rx for nitrofurantoin  sent to her pharmacy given the holiday.

## 2024-08-27 NOTE — Patient Outreach (Signed)
 Complex Care Management   Visit Note  08/27/2024  Name:  Renee Bridges MRN: [id number] DOB: [birth year]  Situation: Referral received for Complex Care Management related to Anemia/Anxiety/Depression/stress. I obtained verbal consent from Patient.  Visit completed with Renee Bridges  on the phone  Background:   Past Medical History:  Diagnosis Date   Abdominal pain 03/27/2024   Abdominal pain, epigastric 03/28/2024   Anxiety    Arthritis    Asthma    Complication of surgical procedure 03/08/2018   Constipation 07/17/2016   Depression    Headache    History of hiatal hernia    Hypertension    Immobility 09/25/2016   Kidney cyst, acquired    Nausea and vomiting 03/28/2024   Seizures (HCC)    hx. as child   Senile purpura 04/16/2022   Shortness of breath dyspnea    with excertion    Assessment: Patient Reported Symptoms:  Cognitive Cognitive Status: Alert and oriented to person, place, and time, Normal speech and language skills, Insightful and able to interpret abstract concepts      Neurological Neurological Review of Symptoms: No symptoms reported    HEENT HEENT Symptoms Reported: Change or loss of hearing (Patient needs hearing aids.)      Cardiovascular Cardiovascular Symptoms Reported: Not assessed    Respiratory Respiratory Symptoms Reported: No symptoms reported    Endocrine Endocrine Symptoms Reported: Not assessed Is patient diabetic?: No    Gastrointestinal Gastrointestinal Symptoms Reported: Not assessed      Genitourinary Additional Genitourinary Details: Patient called PCP office on 08/25/24 complaining of buring with urination.  She declined PCP OV visit.  State she started drinking more water on the 22nd, denies burning when urinating today, declines making appt with PCP.    Integumentary Integumentary Symptoms Reported: Not assessed    Musculoskeletal Musculoskelatal Symptoms Reviewed: No symptoms reported        Psychosocial Psychosocial  Symptoms Reported: Not assessed          08/27/2024    PHQ2-9 Depression Screening   Little interest or pleasure in doing things    Feeling down, depressed, or hopeless    PHQ-2 - Total Score    Trouble falling or staying asleep, or sleeping too much    Feeling tired or having little energy    Poor appetite or overeating     Feeling bad about yourself - or that you are a failure or have let yourself or your family down    Trouble concentrating on things, such as reading the newspaper or watching television    Moving or speaking so slowly that other people could have noticed.  Or the opposite - being so fidgety or restless that you have been moving around a lot more than usual    Thoughts that you would be better off dead, or hurting yourself in some way    PHQ2-9 Total Score    If you checked off any problems, how difficult have these problems made it for you to do your work, take care of things at home, or get along with other people    Depression Interventions/Treatment      There were no vitals filed for this visit.    Medications Reviewed Today   Medications were not reviewed in this encounter     Recommendation:   PCP follow up 10/10/2024 Specialty provider follow-up 09/08/2024 Gastro Patient will complete LINK application and give to PCP to complete the provider portion.    Follow Up Plan:  Telephone follow-up in 1 week  Santana Stamp BSN, CCM [place]  [facility] Population Health RN Care Manager Direct Dial: [phone]  Fax: [phone]

## 2024-08-27 NOTE — Telephone Encounter (Signed)
 Copied from CRM [id number]. Topic: General - Other >> Aug 27, 2024  9:50 AM Amy B wrote: Reason for CRM: FYI Received call from Leita with [facility] who states that the patient has been discharged from PT with goals met.

## 2024-08-27 NOTE — Patient Instructions (Signed)
 Visit Information  Thank you for taking time to visit with me today. Please don't hesitate to contact me if I can be of assistance to you before our next scheduled appointment.  Your next care management appointment is by telephone on Tuesday, December 30th at 11:00am.   Please call the care guide team at [phone] if you need to cancel, schedule, or reschedule an appointment.   Please call the [place]  National Suicide Prevention Lifeline: 339-884-6392 or TTY: (660)076-5093 TTY [phone]) to talk to a trained counselor if you are experiencing a Mental Health or Behavioral Health Crisis or need someone to talk to.  Santana Stamp BSN, CCM [place]  [facility] Population Health RN Care Manager Direct Dial: [phone]  Fax: [phone]

## 2024-09-01 ENCOUNTER — Telehealth: Payer: Self-pay

## 2024-09-01 NOTE — Progress Notes (Signed)
" ° °  Telephone encounter was:  Unsuccessful.  09/01/2024 Name: Renee Bridges MRN: [id number] DOB: 26-May-1946  Unsuccessful outbound call made today to assist with:  Transportation Needs   Outreach Attempt:  1st Attempt  A HIPAA compliant voice message was left requesting a return call.  Instructed patient to call back    Jon Colt [facility] Guide, Phone: [phone] Fax: [phone] Website: [email or web address]    "

## 2024-09-02 ENCOUNTER — Other Ambulatory Visit: Payer: Self-pay

## 2024-09-02 NOTE — Patient Instructions (Signed)
 Visit Information  Thank you for taking time to visit with me today. Please don't hesitate to contact me if I can be of assistance to you before our next scheduled appointment.  Your next care management appointment is by telephone on Tuesday, January 13th at 10:30am.  Please call the care guide team at [phone] if you need to cancel, schedule, or reschedule an appointment.   Please call the [place]  National Suicide Prevention Lifeline: (506)744-2433 or TTY: (817)814-2974 TTY (832)149-0948) to talk to a trained counselor if you are experiencing a Mental Health or Behavioral Health Crisis or need someone to talk to.    Santana Stamp BSN, CCM [place]  [facility] Population Health RN Care Manager Direct Dial: [phone]  Fax: [phone]

## 2024-09-02 NOTE — Patient Outreach (Signed)
 Complex Care Management   Visit Note  09/02/2024  Name:  Renee Bridges MRN: [id number] DOB: 10-14-1945  Situation: Referral received for Complex Care Management related to Anemia/Depression/Anxiety/Stress. I obtained verbal consent from Patient.  Visit completed with Renee Bridges  on the phone  Background:   Past Medical History:  Diagnosis Date   Abdominal pain 03/27/2024   Abdominal pain, epigastric 03/28/2024   Anxiety    Arthritis    Asthma    Complication of surgical procedure 03/08/2018   Constipation 07/17/2016   Depression    Headache    History of hiatal hernia    Hypertension    Immobility 09/25/2016   Kidney cyst, acquired    Nausea and vomiting 03/28/2024   Seizures (HCC)    hx. as child   Senile purpura 04/16/2022   Shortness of breath dyspnea    with excertion    Assessment: Patient Reported Symptoms:  Cognitive Cognitive Status: Alert and oriented to person, place, and time, Insightful and able to interpret abstract concepts, Normal speech and language skills      Neurological Neurological Review of Symptoms: Not assessed    HEENT HEENT Symptoms Reported: Change or loss of hearing (Will send PCP request for referral to [facility])      Cardiovascular Cardiovascular Symptoms Reported: Not assessed    Respiratory Respiratory Symptoms Reported: Not assesed    Endocrine Endocrine Symptoms Reported: Not assessed Is patient diabetic?: No    Gastrointestinal Additional Gastrointestinal Details: Continues to have difficulty with vomiting and stomach pain when she eats and drinks (attempted to discuss if certain foods cause her vomiting but patient is HOH and not able to understand the question).  States it doesn't happen with everything she eats, just every once in a while.  She states she is unable to take Sucralfate  because I throw it up every time I drink it.  She has rescheduled her new patient GASTRO appt to 11/03/24 from 09/08/24 due to she is  afraid [facility] transportation will mess up the ride again.  She is maintaining weight at 135lbs.      Genitourinary Genitourinary Symptoms Reported: No symptoms reported    Integumentary Integumentary Symptoms Reported: Not assessed    Musculoskeletal Musculoskelatal Symptoms Reviewed: Not assessed        Psychosocial Psychosocial Symptoms Reported: No symptoms reported Additional Psychological Details: Patient states she spent Christmas alone because my granddaughter didn't want me at her house, I don't worry about it anymore.  She does have her sister to talk to and a neighbor visits.          09/02/2024    PHQ2-9 Depression Screening   Little interest or pleasure in doing things    Feeling down, depressed, or hopeless    PHQ-2 - Total Score    Trouble falling or staying asleep, or sleeping too much    Feeling tired or having little energy    Poor appetite or overeating     Feeling bad about yourself - or that you are a failure or have let yourself or your family down    Trouble concentrating on things, such as reading the newspaper or watching television    Moving or speaking so slowly that other people could have noticed.  Or the opposite - being so fidgety or restless that you have been moving around a lot more than usual    Thoughts that you would be better off dead, or hurting yourself in some way  PHQ2-9 Total Score    If you checked off any problems, how difficult have these problems made it for you to do your work, take care of things at home, or get along with other people    Depression Interventions/Treatment      There were no vitals filed for this visit.    Medications Reviewed Today   Medications were not reviewed in this encounter     Recommendation:   PCP f/u 10/10/2024 Specialty provider follow-up Oncology 10/27/24 ; GASTRO 11/03/24 Referral to: [facility] (this RNCM sent request for referral to PCP)   Follow Up Plan:   Telephone  follow-up two weeks.  Santana Stamp BSN, CCM [place]  [facility] Population Health RN Care Manager Direct Dial: [phone]  Fax: [phone]

## 2024-09-03 ENCOUNTER — Telehealth: Payer: Self-pay

## 2024-09-03 NOTE — Progress Notes (Signed)
" ° °  Telephone encounter was:  Unsuccessful.  09/03/2024 Name: SHARETTA RICCHIO MRN: [id number] DOB: February 07, 1946  Unsuccessful outbound call made today to assist with:  Transportation Needs   Outreach Attempt:  2nd Attempt  No answer and unable to leave a message   Jon Colt [facility]  [facility] Guide, Phone: [phone] Fax: [phone] Website: [email or web address]    "

## 2024-09-08 ENCOUNTER — Telehealth: Payer: Self-pay

## 2024-09-08 DIAGNOSIS — H9193 Unspecified hearing loss, bilateral: Secondary | ICD-10-CM

## 2024-09-08 MED ORDER — SUCRALFATE 1 G PO TABS: 1.0000 g | ORAL_TABLET | Freq: Three times a day (TID) | ORAL | 1 refills | Status: AC

## 2024-09-08 NOTE — Progress Notes (Signed)
" ° °  Telephone encounter was:  Unsuccessful.  09/08/2024 Name: Renee Bridges MRN: [id number] DOB: Mar 11, 1946  Unsuccessful outbound call made today to assist with:  Transportation Needs   Outreach Attempt:  3rd Attempt.  Referral closed unable to contact patient. No answer and unable to leave a message    Jon Colt [facility] Guide, Phone: [phone] Fax: [phone] Website: [email or web address]    "

## 2024-09-08 NOTE — Telephone Encounter (Signed)
-----   Message from Nurse Santana RAMAN, RN sent at 09/08/2024  2:17 PM EST ----- Regarding: ENT referral Good afternoon Dr. Vicci,   A few items:   (1). Patient needs referral for HEARING EXAM with [facility] (or another ENT if you have a preference).  Staff at [facility] states [facility] (patient's health insurance) require a referral from PCP.    (2).  She is not taking carafate  as ordered, states every time I drink it I throw it up.  Reports she is taking the omeprazole  20mg  once daily.   (3).  She unfortunately rescheduled her new patient GASTRO appt from 09/08/24 to  March 2nd due to fear of using [facility] Complete transportation and has no one else to take her.  (4.) The VBCI team is working with patient to obtain another source of transportation due to [facility] Dual Complete transportation has been unreliable (one example is at a recent eye MD visit, the transportation company did not pick her up after several calls were made to them- a complete stranger saw her in distress and took her home).    As soon as we can get Renee Bridges signed up for [facility] transportation, I'll help her try and get a quicker appt with  [facility].   Warm regards,  Santana Stamp BSN, CCM [place]  [facility] Population Health RN Care Manager Direct Dial: [phone]  Fax: [phone]

## 2024-09-16 ENCOUNTER — Other Ambulatory Visit: Payer: Self-pay

## 2024-09-16 NOTE — Patient Outreach (Addendum)
 Complex Care Management   Visit Note  09/16/2024  Name:  Renee Bridges MRN: [id number] DOB: [birth year]  Situation: Referral received for Complex Care Management related to Anemia, depression/anxiety/stress. I obtained verbal consent from Patient.  Visit completed with [name]  on the phone  Background:   Past Medical History:  Diagnosis Date   Abdominal pain 03/27/2024   Abdominal pain, epigastric 03/28/2024   Anxiety    Arthritis    Asthma    Complication of surgical procedure 03/08/2018   Constipation 07/17/2016   Depression    Headache    History of hiatal hernia    Hypertension    Immobility 09/25/2016   Kidney cyst, acquired    Nausea and vomiting 03/28/2024   Seizures (HCC)    hx. as child   Senile purpura 04/16/2022   Shortness of breath dyspnea    with excertion    Assessment: Patient Reported Symptoms:  Cognitive Cognitive Status: Alert and oriented to person, place, and time, Insightful and able to interpret abstract concepts, Normal speech and language skills      Neurological Neurological Review of Symptoms: No symptoms reported    HEENT HEENT Symptoms Reported: Change or loss of hearing HEENT Comment: Appt at [facility] for hearing exam on 09/30/24 at 3pm.    Cardiovascular Cardiovascular Symptoms Reported: No symptoms reported    Respiratory Respiratory Symptoms Reported: No symptoms reported    Endocrine Endocrine Symptoms Reported: No symptoms reported Is patient diabetic?: No    Gastrointestinal Gastrointestinal Symptoms Reported: Abdominal pain or discomfort Additional Gastrointestinal Details: Reports discomfort with certain foods, states I'm eating like crazy but not able to gain weight.  Reports weight at around 132lbs, recent documented weight is 135lbs on 07/15/24.      Genitourinary Genitourinary Symptoms Reported: No symptoms reported    Integumentary Integumentary Symptoms Reported: Not assessed    Musculoskeletal  Musculoskelatal Symptoms Reviewed: Limited mobility Additional Musculoskeletal Details: Continues to use cane when she walks outside the home. Musculoskeletal Self-Management Outcome: 4 (good)      Psychosocial Psychosocial Symptoms Reported: Not assessed          09/16/2024    PHQ2-9 Depression Screening   Little interest or pleasure in doing things    Feeling down, depressed, or hopeless    PHQ-2 - Total Score    Trouble falling or staying asleep, or sleeping too much    Feeling tired or having little energy    Poor appetite or overeating     Feeling bad about yourself - or that you are a failure or have let yourself or your family down    Trouble concentrating on things, such as reading the newspaper or watching television    Moving or speaking so slowly that other people could have noticed.  Or the opposite - being so fidgety or restless that you have been moving around a lot more than usual    Thoughts that you would be better off dead, or hurting yourself in some way    PHQ2-9 Total Score    If you checked off any problems, how difficult have these problems made it for you to do your work, take care of things at home, or get along with other people    Depression Interventions/Treatment      There were no vitals filed for this visit.    MEDICATIONS: Patient will start taking sulcrafate 1G pill as ordered.     Recommendation:   PCP appt 10/10/24 Specialty provider follow-up :  [facility] for hearing exam 09/30/24 at 3pm; Oncology 10/27/24; [place] 11/03/24   Follow Up Plan:   Telephone follow-up in 1 week  Santana Stamp BSN, CCM [place]  [facility] RN Care Manager Direct Dial: [phone]  Fax: [phone]

## 2024-09-16 NOTE — Patient Instructions (Signed)
 Visit Information  Thank you for taking time to visit with me today. Please don't hesitate to contact me if I can be of assistance to you before our next scheduled appointment.  Your next care management appointment is by telephone on Wednesday, January 21st at 10:30am.   Please call the care guide team at [phone] if you need to cancel, schedule, or reschedule an appointment.   Please call the [place]  National Suicide Prevention Lifeline: (732)194-1839 or TTY: 647-640-2702 TTY 8657123153) to talk to a trained counselor if you are experiencing a Mental Health or Behavioral Health Crisis or need someone to talk to.  Santana Stamp BSN, CCM [place]  [facility] Population Health RN Care Manager Direct Dial: [phone]  Fax: [phone]

## 2024-09-17 ENCOUNTER — Encounter: Payer: Self-pay | Admitting: Family Medicine

## 2024-09-24 ENCOUNTER — Other Ambulatory Visit: Payer: Self-pay

## 2024-09-24 NOTE — Patient Outreach (Unsigned)
 Complex Care Management   Visit Note  09/24/2024  Name:  Renee Bridges MRN: [id number] DOB: 1946-07-04  Situation: Referral received for Complex Care Management related to Anemia, Depression/Anxiety. I obtained verbal consent from Patient.  Visit completed with Renee Bridges  on the phone. Renee Bridges has difficulty setting up rides through her [facility] plan due to reduced hearing and trying to understand transportation staff over the phone.   Background:   Past Medical History:  Diagnosis Date   Abdominal pain 03/27/2024   Abdominal pain, epigastric 03/28/2024   Anxiety    Arthritis    Asthma    Complication of surgical procedure 03/08/2018   Constipation 07/17/2016   Depression    Headache    History of hiatal hernia    Hypertension    Immobility 09/25/2016   Kidney cyst, acquired    Nausea and vomiting 03/28/2024   Seizures (HCC)    hx. as child   Senile purpura 04/16/2022   Shortness of breath dyspnea    with excertion    Assessment: Patient Reported Symptoms:  Cognitive Cognitive Status: Alert and oriented to person, place, and time, Insightful and able to interpret abstract concepts, Normal speech and language skills      Neurological Neurological Review of Symptoms: Headaches Neurological Comment: Reports headache today, took Tylenol , she hasn't had anything to eat today, will fix herself lunch.  HEENT HEENT Symptoms Reported: Change or loss of hearing HEENT Comment: Appt at [facility] for hearing exam on 09/30/24 at 3pm. Three way call with [facility] transporation to schedule ride for appt.    Cardiovascular Cardiovascular Symptoms Reported: Not assessed Weight: 135 lb (61.2 kg) (Reports today's weight)  Respiratory Respiratory Symptoms Reported: No symptoms reported    Endocrine Endocrine Symptoms Reported: Not assessed Is patient diabetic?: No    Gastrointestinal Gastrointestinal Symptoms Reported: Abdominal pain or discomfort Additional Gastrointestinal  Details: Continues to report discomfort in her stomach with certain foods, she vomits if a food doesn't agree with her.  States she is taking omeprazole , but hasn't tried to take carafate  pills yet, agrees to try.  REminded her carafate  is taken on an empty stomach. Gastrointestinal Management Strategies: Medication therapy Gastrointestinal Self-Management Outcome: 3 (uncertain) Gastrointestinal Comment: Gastro appt changed to 11/03/24    Genitourinary Genitourinary Symptoms Reported: Not assessed    Integumentary Integumentary Symptoms Reported: Not assessed    Musculoskeletal Additional Musculoskeletal Details: Continues to use cane when she walks outside of the home. Musculoskeletal Self-Management Outcome: 4 (good)      Psychosocial Psychosocial Symptoms Reported: Not assessed          09/24/2024    PHQ2-9 Depression Screening   Little interest or pleasure in doing things    Feeling down, depressed, or hopeless    PHQ-2 - Total Score    Trouble falling or staying asleep, or sleeping too much    Feeling tired or having little energy    Poor appetite or overeating     Feeling bad about yourself - or that you are a failure or have let yourself or your family down    Trouble concentrating on things, such as reading the newspaper or watching television    Moving or speaking so slowly that other people could have noticed.  Or the opposite - being so fidgety or restless that you have been moving around a lot more than usual    Thoughts that you would be better off dead, or hurting yourself in some way  PHQ2-9 Total Score    If you checked off any problems, how difficult have these problems made it for you to do your work, take care of things at home, or get along with other people    Depression Interventions/Treatment      Today's Vitals   09/24/24 1336  Weight: 135 lb (61.2 kg)   Pain Scale: 0-10 Pain Score: 3  Pain Type: Acute pain Pain Location: Abdomen Pain Orientation:  Lower Pain Descriptors / Indicators: Discomfort Pain Onset: On-going Pain Intervention(s): Medication (See eMAR) Multiple Pain Sites: No  Medications Reviewed Today   Medications were not reviewed in this encounter     Recommendation:   PCP follow up 10/10/24 [facility] for hearing exam 09/30/24; Oncology 10/27/24 This RNCM called PCP office to inform [employer] transportation application will arrive in the mail, needs PCP to complete their part, then mail the address provided on the application.   Follow Up Plan:   Telephone follow-up two weeks  Santana Stamp BSN, CCM [place]  [facility] Population Health RN Care Manager Direct Dial: [phone]  Fax: [phone]

## 2024-09-25 ENCOUNTER — Telehealth: Payer: Self-pay | Admitting: Family Medicine

## 2024-09-25 NOTE — Patient Outreach (Signed)
 Complex Care Management   Visit Note  09/25/2024  Name:  Renee Bridges MRN: [id number] DOB: 12/25/1945  Situation: Referral received for Complex Care Management related to Anemia, Depression/anxiety. I obtained verbal consent from Patient.  Visit completed with Renee Bridges  on the phone.  Renee Bridges has difficulty setting up rides through her [facility] Dual Complete plan due to reduced hearing and trying to understand transportation staff over the phone.   Background:   Past Medical History:  Diagnosis Date   Abdominal pain 03/27/2024   Abdominal pain, epigastric 03/28/2024   Anxiety    Arthritis    Asthma    Complication of surgical procedure 03/08/2018   Constipation 07/17/2016   Depression    Headache    History of hiatal hernia    Hypertension    Immobility 09/25/2016   Kidney cyst, acquired    Nausea and vomiting 03/28/2024   Seizures (HCC)    hx. as child   Senile purpura 04/16/2022   Shortness of breath dyspnea    with excertion    Assessment: Patient Reported Symptoms:  Cognitive Cognitive Status: Alert and oriented to person, place, and time, Insightful and able to interpret abstract concepts, Normal speech and language skills      Neurological Neurological Review of Symptoms: Headaches Neurological Comment: Reports headache today, took Tylenol , she hasn't had anything to eat today, will fix herself lunch.  HEENT HEENT Symptoms Reported: Change or loss of hearing HEENT Comment: Appt at [facility] for hearing exam on 09/30/24 at 3pm. Three way call with [facility] transporation to schedule ride for appt.    Cardiovascular Cardiovascular Symptoms Reported: Not assessed Weight: 135 lb (61.2 kg) (Reports today's weight)  Respiratory Respiratory Symptoms Reported: No symptoms reported    Endocrine Endocrine Symptoms Reported: Not assessed Is patient diabetic?: No    Gastrointestinal Gastrointestinal Symptoms Reported: Abdominal pain or discomfort Additional Gastrointestinal  Details: Continues to report discomfort in her stomach with certain foods, she vomits if a food doesn't agree with her.  States she is taking omeprazole , but hasn't tried to take carafate  pills yet, agrees to try.  REminded her carafate  is taken on an empty stomach. Gastrointestinal Management Strategies: Medication therapy Gastrointestinal Self-Management Outcome: 3 (uncertain) Gastrointestinal Comment: Gastro appt changed to 11/03/24    Genitourinary Genitourinary Symptoms Reported: Not assessed    Integumentary Integumentary Symptoms Reported: Not assessed    Musculoskeletal Additional Musculoskeletal Details: Continues to use cane when she walks outside of the home. Musculoskeletal Self-Management Outcome: 4 (good)      Psychosocial Psychosocial Symptoms Reported: Not assessed          09/25/2024    PHQ2-9 Depression Screening   Little interest or pleasure in doing things    Feeling down, depressed, or hopeless    PHQ-2 - Total Score    Trouble falling or staying asleep, or sleeping too much    Feeling tired or having little energy    Poor appetite or overeating     Feeling bad about yourself - or that you are a failure or have let yourself or your family down    Trouble concentrating on things, such as reading the newspaper or watching television    Moving or speaking so slowly that other people could have noticed.  Or the opposite - being so fidgety or restless that you have been moving around a lot more than usual    Thoughts that you would be better off dead, or hurting yourself in some way  PHQ2-9 Total Score    If you checked off any problems, how difficult have these problems made it for you to do your work, take care of things at home, or get along with other people    Depression Interventions/Treatment      Today's Vitals   09/24/24 1336  Weight: 135 lb (61.2 kg)   Pain Scale: 0-10 Pain Score: 3  Pain Type: Acute pain Pain Location: Abdomen Pain Orientation:  Lower Pain Descriptors / Indicators: Discomfort Pain Onset: On-going Pain Intervention(s): Medication (See eMAR) Multiple Pain Sites: No  Medications Reviewed Today   Medications were not reviewed in this encounter     Recommendation:   PCP follow up 10/10/24 Specialty provider follow-up : [facility] for hearing exam 09/30/24; Oncology 10/27/24 This RNCM called PCP office to inform [employer] transportation application will arrive in the mail, needs PCP to complete their part, then mail the address provided on the application.   Follow Up Plan:   Telephone follow-up two weeks.  Santana Stamp BSN, CCM   [facility] Population Health RN Care Manager Direct Dial: [phone]  Fax: [phone]

## 2024-09-25 NOTE — Patient Instructions (Signed)
 Visit Information  Thank you for taking time to visit with me today. Please don't hesitate to contact me if I can be of assistance to you before our next scheduled appointment.  Your next care management appointment is by telephone on Monday, February 2nd at 2pm.    Please call the care guide team at [phone] if you need to cancel, schedule, or reschedule an appointment.   Please call the [place]  National Suicide Prevention Lifeline: 905-230-8318 or TTY: (832) 834-1213 TTY (534)302-7554) to talk to a trained counselor if you are experiencing a Mental Health or Behavioral Health Crisis or need someone to talk to.  Santana Stamp BSN, CCM [place]  [facility] Population Health RN Care Manager Direct Dial: [phone]  Fax: [phone]

## 2024-09-25 NOTE — Telephone Encounter (Signed)
 Patient sent Transit Application to the office to be completed by provider. When complete patient has asked if it can be sent to the address on the application.  Paperwork to be placed in providers incoming folder to be worked by their team. Jud is expected to be completed and returned 5-7 business days.

## 2024-09-26 NOTE — Telephone Encounter (Signed)
 Paperwork placed in provider folder for review and signature

## 2024-09-30 NOTE — Telephone Encounter (Signed)
 Forms mailed out to requested today

## 2024-10-01 ENCOUNTER — Other Ambulatory Visit: Payer: Self-pay | Admitting: *Deleted

## 2024-10-01 NOTE — Patient Instructions (Signed)
 SABRA

## 2024-10-02 NOTE — Telephone Encounter (Signed)
 Copied from CRM #[id number]. Topic: General - Other >> Oct 02, 2024 11:51 AM Willma SAUNDERS wrote: Reason for CRM: Patient is requesting a call from [place]. Wants to talk about transportation options. Can be reached at [phone]

## 2024-10-02 NOTE — Telephone Encounter (Signed)
 Called patient and left a message for her to call back, I wanted to clarify what she was requesting as we do not have a Santana that works here.

## 2024-10-02 NOTE — Patient Outreach (Signed)
 Complex Care Management   Visit Note  10/02/2024  Name:  Renee Bridges MRN: [id number] DOB: 07-08-1946  Situation: Referral received for Complex Care Management related to Mental/Behavioral Health diagnosis depression I obtained verbal consent from Patient.  Visit completed with Patient  on the phone  Background:   Past Medical History:  Diagnosis Date   Abdominal pain 03/27/2024   Abdominal pain, epigastric 03/28/2024   Anxiety    Arthritis    Asthma    Complication of surgical procedure 03/08/2018   Constipation 07/17/2016   Depression    Headache    History of hiatal hernia    Hypertension    Immobility 09/25/2016   Kidney cyst, acquired    Nausea and vomiting 03/28/2024   Seizures (HCC)    hx. as child   Senile purpura 04/16/2022   Shortness of breath dyspnea    with excertion    Assessment: Patient discussed loss of daughter and limits to her activity due to her medical condition. Patient confirmed diversionla activities such as watching TV and sports.  Emotional support provided, will follow up with patient to continue to discuss goal setting Patient Reported Symptoms:  Cognitive Cognitive Status: Alert and oriented to person, place, and time, Insightful and able to interpret abstract concepts, Normal speech and language skills      Neurological Neurological Review of Symptoms: No symptoms reported    HEENT HEENT Symptoms Reported: Change or loss of hearing HEENT Comment: Appt at [facility] for hearing exam cancelled on 09/30/24 due to inclement weather-will have to re-schedule appointment and [facility] transportation    Cardiovascular Cardiovascular Symptoms Reported: Chest pain or discomfort (on and off)    Respiratory Respiratory Symptoms Reported: No symptoms reported    Endocrine Endocrine Symptoms Reported: No symptoms reported Is patient diabetic?: No    Gastrointestinal Gastrointestinal Symptoms Reported: Abdominal pain or discomfort Additional  Gastrointestinal Details: continues to report tendancy vomit with certain foods Gastrointestinal Management Strategies: Medication therapy    Genitourinary Genitourinary Symptoms Reported: No symptoms reported    Integumentary Integumentary Symptoms Reported: No symptoms reported    Musculoskeletal Musculoskelatal Symptoms Reviewed: Limited mobility Additional Musculoskeletal Details: limited mobility-unchanged, continues to use cane when outside of the home Musculoskeletal Management Strategies: Coping strategies, Medical device      Psychosocial Psychosocial Symptoms Reported: No symptoms reported Additional Psychological Details: daughter passed away few years ago, thinks about her often- has 2 sister's that she calls and talks to regularly Behavioral Management Strategies: Medication therapy Major Change/Loss/Stressor/Fears (CP): Death of a loved one Behaviors When Feeling Stressed/Fearful: watches telelvision , sports, talks to sisters Techniques to [place] with Loss/Stress/Change: Diversional activities Do you feel physically threatened by others?: No    10/02/2024    PHQ2-9 Depression Screening   Little interest or pleasure in doing things Not at all  Feeling down, depressed, or hopeless Several days  PHQ-2 - Total Score 1  Trouble falling or staying asleep, or sleeping too much    Feeling tired or having little energy    Poor appetite or overeating     Feeling bad about yourself - or that you are a failure or have let yourself or your family down    Trouble concentrating on things, such as reading the newspaper or watching television    Moving or speaking so slowly that other people could have noticed.  Or the opposite - being so fidgety or restless that you have been moving around a lot more than usual  Thoughts that you would be better off dead, or hurting yourself in some way    PHQ2-9 Total Score    If you checked off any problems, how difficult have these problems made it  for you to do your work, take care of things at home, or get along with other people    Depression Interventions/Treatment      There were no vitals filed for this visit.    Medications Reviewed Today     Reviewed by Ermalinda Lenn HERO, LCSW (Social Worker) on 10/01/24 at 1417  Med List Status: <None>   Medication Order Taking? Sig Documenting Provider Last Dose Status Informant  FLUoxetine  (PROZAC ) 20 MG capsule [id number] Yes Take 1 capsule (20 mg total) by mouth daily. Johnson, Megan P, DO  Active   gabapentin  (NEURONTIN ) 100 MG capsule [id number]  Take 1 capsule (100 mg total) by mouth at bedtime.  Patient not taking: Reported on 10/01/2024   Vicci Duwaine SQUIBB, DO  Active   levocetirizine (XYZAL ) 5 MG tablet [id number]  Take 1 tablet (5 mg total) by mouth every evening.  Patient not taking: Reported on 10/01/2024   Vicci Duwaine SQUIBB, DO  Active   nitrofurantoin , macrocrystal-monohydrate, (MACROBID ) 100 MG capsule [id number] Yes Take 1 capsule (100 mg total) by mouth 2 (two) times daily. Johnson, Megan P, DO  Active   omeprazole  (PRILOSEC) 20 MG capsule [id number] Yes Take 1 capsule (20 mg total) by mouth daily. Johnson, Megan P, DO  Active   ondansetron  (ZOFRAN -ODT) 4 MG disintegrating tablet [id number] Yes Take 1 tablet (4 mg total) by mouth every 8 (eight) hours as needed for nausea or vomiting. Fausto Sor A, DO  Active   sucralfate  (CARAFATE ) 1 g tablet [id number] Yes Take 1 tablet (1 g total) by mouth 4 (four) times daily -  with meals and at bedtime. Vicci, Megan P, DO  Active   triamcinolone  cream (KENALOG ) 0.1 % [id number] Yes Apply 1 Application topically 2 (two) times daily. Apply to rash areas. Cannady, Jolene T, NP  Active   Vitamin D , Ergocalciferol , (DRISDOL ) 1.25 MG (50000 UNIT) CAPS capsule [id number]  Take 1 capsule (50,000 Units total) by mouth every 7 (seven) days.  Patient not taking: Reported on 10/01/2024   Vicci Duwaine SQUIBB, DO  Active             Recommendation:    PCP Follow-up Specialty provider follow-up as scheduled  Follow Up Plan:   Telephone follow up appointment date/time:  10/13/24 3pm  Arline Ketter, LCSW [place]  [facility], [facility] Licensed Clinical Social Worker  Direct Dial: [phone]

## 2024-10-03 ENCOUNTER — Ambulatory Visit: Payer: Self-pay

## 2024-10-03 NOTE — Telephone Encounter (Signed)
 FYI Only or Action Required?: Action required by provider: update on patient condition and ADVISED ED_ UNSURE OF COMPREHENSION .  Patient was last seen in primary care on 07/15/2024 by Vicci Duwaine SQUIBB, DO.  Called Nurse Triage reporting Nausea and Fever.  Symptoms began yesterday.  Interventions attempted: Rest, hydration, or home remedies.  Symptoms are: gradually worsening.  Triage Disposition: Go to ED Now (Notify PCP)  Patient/caregiver understands and will follow disposition?: Unsure  Message from [facility] G sent at 10/03/2024 11:40 AM EST   Reason for Triage: nausea.SABRA low fever Reason for Disposition  [1] Vomiting AND [2] contains red blood or black (coffee ground) material  (Exception: Few red streaks in vomit that only happened once.)  Answer Assessment - Initial Assessment Questions Patient calling in to report nausea and vomiting and fever. Has been throwing up every meal for a couple days. Fever of 101.3 and staying weak.  Getting iron  infusions and having fatigue and weakness from anemia but worsened.  Also endorses some burning with urination. UTA further   Patient VERY hard of hearing and mid High Priority questions advised hearing aid appt rescheduled to 3/23.   Unsure of the clarity or the understanding of patient and questions. Would not answer the amount of times she has thrown up blood or how much blood. Just kept saying if it gets worse she will find a way to get to the ED.   Advised ED via EMS. Patient states if she gets worse she will go  Reached out to daughter to try to gain clarity and make her aware- no answer x2.   Sending HP to clinic to reach out to patient regarding symptoms  1. VOMITING SEVERITY: How many times have you vomited in the past 24 hours?      Every time she eats she is vomiting  2. ONSET: When did the vomiting begin?      A couple days 3. FLUIDS: What fluids or food have you vomited up today? Have you been able to keep any  fluids down?     UTA 4. ABDOMEN PAIN: Are your having any abdomen pain? If Yes : How bad is it and what does it feel like? (e.g., crampy, dull, intermittent, constant)      UTA 5. DIARRHEA: Is there any diarrhea? If Yes, ask: How many times today?      denies 6. CONTACTS: Is there anyone else in the family with the same symptoms?      Cold and fever last week  7. CAUSE: What do you think is causing your vomiting?     Flu?  8. HYDRATION STATUS: Any signs of dehydration? (e.g., dry mouth [not only dry lips], too weak to stand) When did you last urinate?     Fatigued but low iron  anyways  9. OTHER SYMPTOMS: Do you have any other symptoms? (e.g., fever, headache, vertigo, vomiting blood or coffee grounds, recent head injury)     Fever, weak  Protocols used: Vomiting-A-AH

## 2024-10-03 NOTE — Telephone Encounter (Signed)
 1st attempt at calling patient---Called twice and left a voicemail on the 2nd call for patient to call back      Message from [facility] G sent at 10/03/2024 11:40 AM EST  Reason for Triage: nausea.SABRA low fever

## 2024-10-03 NOTE — Telephone Encounter (Signed)
 Harlene, granddaughter, returning call. Read RN's note Hard of hearing- unsure if understands ED/EMS dispo for vomiting blood- Please reach out to patient- tried to contact daughter. She states she will call the patient and make sure she is agreeable/aware.

## 2024-10-06 ENCOUNTER — Other Ambulatory Visit: Payer: Self-pay

## 2024-10-06 NOTE — Patient Instructions (Signed)
 Visit Information  Thank you for taking time to visit with me today. Please don't hesitate to contact me if I can be of assistance to you before our next scheduled appointment.  Your next care management appointment is by telephone on Monday, February 16th at 2:00pm.  Please call the care guide team at [phone] if you need to cancel, schedule, or reschedule an appointment.   Please call the [place]  National Suicide Prevention Lifeline: (828) 845-2693 or TTY: 334 183 8551 TTY (860)686-0267) to talk to a trained counselor if you are experiencing a Mental Health or Behavioral Health Crisis or need someone to talk to.  Santana Stamp BSN, CCM [place]  [facility] Population Health RN Care Manager Direct Dial: [phone]  Fax: [phone]

## 2024-10-10 ENCOUNTER — Ambulatory Visit: Admitting: Family Medicine

## 2024-10-13 ENCOUNTER — Telehealth: Admitting: *Deleted

## 2024-10-20 ENCOUNTER — Telehealth

## 2024-10-27 ENCOUNTER — Inpatient Hospital Stay

## 2024-10-28 ENCOUNTER — Inpatient Hospital Stay

## 2024-10-28 ENCOUNTER — Inpatient Hospital Stay: Admitting: Oncology

## 2024-11-11 ENCOUNTER — Ambulatory Visit: Admit: 2024-11-11 | Admitting: Ophthalmology

## 2024-11-11 SURGERY — PHACOEMULSIFICATION, CATARACT, WITH IOL INSERTION
Anesthesia: Topical | Laterality: Right

## 2024-11-18 ENCOUNTER — Ambulatory Visit: Admitting: Family Medicine

## 2024-11-25 ENCOUNTER — Ambulatory Visit: Admit: 2024-11-25 | Admitting: Ophthalmology

## 2024-11-25 SURGERY — PHACOEMULSIFICATION, CATARACT, WITH IOL INSERTION
Anesthesia: Topical | Laterality: Left

## 2025-01-20 ENCOUNTER — Ambulatory Visit
